# Patient Record
Sex: Female | Born: 1965
Health system: Southern US, Community
[De-identification: ages and names within clinical notes are randomized; demographics above are authoritative.]

## PROBLEM LIST (undated history)

## (undated) DIAGNOSIS — Z5189 Encounter for other specified aftercare: Secondary | ICD-10-CM

## (undated) DIAGNOSIS — K219 Gastro-esophageal reflux disease without esophagitis: Secondary | ICD-10-CM

## (undated) DIAGNOSIS — I10 Essential (primary) hypertension: Secondary | ICD-10-CM

## (undated) DIAGNOSIS — R51 Headache: Secondary | ICD-10-CM

## (undated) DIAGNOSIS — R569 Unspecified convulsions: Secondary | ICD-10-CM

## (undated) HISTORY — PX: HERNIA REPAIR: SHX51

## (undated) HISTORY — DX: Encounter for other specified aftercare: Z51.89

## (undated) HISTORY — DX: Gastro-esophageal reflux disease without esophagitis: K21.9

## (undated) HISTORY — PX: UTERINE FIBROID SURGERY: SHX826

---

## 1999-04-29 ENCOUNTER — Emergency Department (HOSPITAL_COMMUNITY): Admission: EM | Admit: 1999-04-29 | Discharge: 1999-04-29 | Payer: Self-pay | Admitting: Emergency Medicine

## 2003-12-26 ENCOUNTER — Emergency Department (HOSPITAL_COMMUNITY): Admission: AD | Admit: 2003-12-26 | Discharge: 2003-12-26 | Payer: Self-pay | Admitting: Emergency Medicine

## 2005-08-22 ENCOUNTER — Emergency Department (HOSPITAL_COMMUNITY): Admission: EM | Admit: 2005-08-22 | Discharge: 2005-08-22 | Payer: Self-pay | Admitting: Emergency Medicine

## 2007-03-22 ENCOUNTER — Inpatient Hospital Stay (HOSPITAL_COMMUNITY): Admission: AD | Admit: 2007-03-22 | Discharge: 2007-03-22 | Payer: Self-pay | Admitting: Family Medicine

## 2007-05-23 ENCOUNTER — Ambulatory Visit (HOSPITAL_COMMUNITY): Admission: RE | Admit: 2007-05-23 | Discharge: 2007-05-23 | Payer: Self-pay | Admitting: Obstetrics

## 2007-05-23 ENCOUNTER — Ambulatory Visit: Payer: Self-pay | Admitting: Vascular Surgery

## 2007-05-23 ENCOUNTER — Ambulatory Visit: Payer: Self-pay | Admitting: Internal Medicine

## 2007-05-23 ENCOUNTER — Ambulatory Visit: Payer: Self-pay | Admitting: Hospitalist

## 2007-05-23 ENCOUNTER — Encounter (INDEPENDENT_AMBULATORY_CARE_PROVIDER_SITE_OTHER): Payer: Self-pay | Admitting: Obstetrics

## 2007-05-23 ENCOUNTER — Inpatient Hospital Stay (HOSPITAL_COMMUNITY): Admission: EM | Admit: 2007-05-23 | Discharge: 2007-05-30 | Payer: Self-pay | Admitting: Emergency Medicine

## 2007-05-27 ENCOUNTER — Encounter (INDEPENDENT_AMBULATORY_CARE_PROVIDER_SITE_OTHER): Payer: Self-pay | Admitting: Hospitalist

## 2007-06-03 ENCOUNTER — Ambulatory Visit: Payer: Self-pay | Admitting: Internal Medicine

## 2007-06-03 ENCOUNTER — Encounter: Payer: Self-pay | Admitting: Pharmacist

## 2007-06-03 DIAGNOSIS — I82409 Acute embolism and thrombosis of unspecified deep veins of unspecified lower extremity: Secondary | ICD-10-CM | POA: Insufficient documentation

## 2007-06-10 ENCOUNTER — Ambulatory Visit: Payer: Self-pay | Admitting: Hospitalist

## 2007-06-10 LAB — CONVERTED CEMR LAB

## 2007-06-17 ENCOUNTER — Ambulatory Visit: Payer: Self-pay | Admitting: *Deleted

## 2007-06-17 LAB — CONVERTED CEMR LAB: INR: 2

## 2007-06-21 ENCOUNTER — Encounter: Payer: Self-pay | Admitting: Pharmacist

## 2007-06-21 ENCOUNTER — Ambulatory Visit: Payer: Self-pay | Admitting: Internal Medicine

## 2007-06-21 LAB — CONVERTED CEMR LAB

## 2007-06-28 ENCOUNTER — Ambulatory Visit: Payer: Self-pay | Admitting: *Deleted

## 2007-06-28 LAB — CONVERTED CEMR LAB: INR: 5.8

## 2007-07-04 ENCOUNTER — Emergency Department (HOSPITAL_COMMUNITY): Admission: EM | Admit: 2007-07-04 | Discharge: 2007-07-04 | Payer: Self-pay | Admitting: Emergency Medicine

## 2007-07-08 ENCOUNTER — Ambulatory Visit: Payer: Self-pay | Admitting: Infectious Disease

## 2007-07-12 ENCOUNTER — Ambulatory Visit: Payer: Self-pay | Admitting: Internal Medicine

## 2007-07-12 LAB — CONVERTED CEMR LAB: INR: 2.6

## 2007-07-22 ENCOUNTER — Ambulatory Visit: Payer: Self-pay | Admitting: Internal Medicine

## 2007-07-22 LAB — CONVERTED CEMR LAB

## 2007-08-19 ENCOUNTER — Ambulatory Visit: Payer: Self-pay | Admitting: Internal Medicine

## 2007-08-19 LAB — CONVERTED CEMR LAB: INR: 2.1

## 2007-09-09 ENCOUNTER — Emergency Department (HOSPITAL_COMMUNITY): Admission: EM | Admit: 2007-09-09 | Discharge: 2007-09-09 | Payer: Self-pay | Admitting: Emergency Medicine

## 2007-10-07 ENCOUNTER — Ambulatory Visit: Payer: Self-pay | Admitting: Hospitalist

## 2007-10-07 LAB — CONVERTED CEMR LAB: INR: 2

## 2007-11-11 ENCOUNTER — Encounter: Payer: Self-pay | Admitting: Infectious Diseases

## 2007-11-11 ENCOUNTER — Ambulatory Visit: Payer: Self-pay | Admitting: Internal Medicine

## 2007-11-11 LAB — CONVERTED CEMR LAB

## 2007-12-30 ENCOUNTER — Ambulatory Visit: Payer: Self-pay | Admitting: Hospitalist

## 2007-12-30 LAB — CONVERTED CEMR LAB: INR: 2.7

## 2008-01-10 ENCOUNTER — Telehealth: Payer: Self-pay | Admitting: *Deleted

## 2008-01-16 ENCOUNTER — Encounter: Payer: Self-pay | Admitting: Internal Medicine

## 2008-01-16 ENCOUNTER — Ambulatory Visit: Payer: Self-pay | Admitting: Internal Medicine

## 2008-01-16 DIAGNOSIS — D259 Leiomyoma of uterus, unspecified: Secondary | ICD-10-CM | POA: Insufficient documentation

## 2008-01-16 DIAGNOSIS — R059 Cough, unspecified: Secondary | ICD-10-CM | POA: Insufficient documentation

## 2008-01-16 DIAGNOSIS — R05 Cough: Secondary | ICD-10-CM

## 2008-01-17 ENCOUNTER — Telehealth: Payer: Self-pay | Admitting: Internal Medicine

## 2008-01-18 LAB — CONVERTED CEMR LAB
Hemoglobin: 7.1 g/dL — CL (ref 12.0–15.0)
RBC: 4.07 M/uL (ref 3.87–5.11)

## 2008-01-20 ENCOUNTER — Ambulatory Visit: Payer: Self-pay | Admitting: Internal Medicine

## 2008-01-20 DIAGNOSIS — Z86718 Personal history of other venous thrombosis and embolism: Secondary | ICD-10-CM | POA: Insufficient documentation

## 2008-01-30 ENCOUNTER — Encounter (INDEPENDENT_AMBULATORY_CARE_PROVIDER_SITE_OTHER): Payer: Self-pay | Admitting: Pharmacist

## 2008-02-19 ENCOUNTER — Ambulatory Visit (HOSPITAL_COMMUNITY): Admission: RE | Admit: 2008-02-19 | Discharge: 2008-02-19 | Payer: Self-pay | Admitting: Obstetrics

## 2008-08-10 IMAGING — XA IR [PERSON_NAME]/[PERSON_NAME]
3 series · 16 of 24 positions shown · non-contrast
Comparison: none

CLINICAL DATA: 40-year-old with uterine bleeding, presumably from fibroids.  Patient also has a left lower extremity DVT.  We have been asked to place a retrievable IVC filter, which could be removed following hysterectomy. 
IVC VENOGRAM:
ULTRASOUND GUIDANCE FOR VASCULAR ACCESS:
Medication for sedation:  Versed 1.5 mg, fentanyl 75 mcg. 
Total sedation time:  42 minutes. 
Procedure:  Informed consent was obtained from the patient.  The patient's right groin was evaluated with ultrasound, demonstrating a large and patent right common femoral vein.  The right groin was prepped and draped in a sterile fashion.  The skin was anesthetized with 1% lidocaine.   A 21-gauge needle was directed into the right common femoral vein with ultrasound guidance.  A micropuncture dilator set was placed.  A Bentson wire could not be advanced into the IVC, and there appeared to be an obstruction.  A 7obra-Y catheter was used to try to advance the wire into the IVC, but this was found to be difficult.  A venogram performed in the right external iliac vein demonstrated patency of the iliac vein with very large collateral vessels and no significant filling of the infrarenal IVC.  Findings were concerning for an underlying obstruction versus compression from the uterus.  Eventually, a wire was advanced with some difficulty into the suprarenal IVC, and an IVC venogram was performed.  The IVC venogram demonstrated very poor filling of the infrarenal IVC and normal filling of the suprarenal IVC.  The suprarenal IVC appeared to measure at least 25 mm on our examination.  Based on the uncertain etiology of the IVC obstruction, a suprarenal filter was not placed at this time.  The vascular sheath was removed with manual compression, and the patient was transferred to the CT scanner for further imaging.

[Series 1: run · 11 of 52 slices shown (1 of 3)]
[im 1/52]
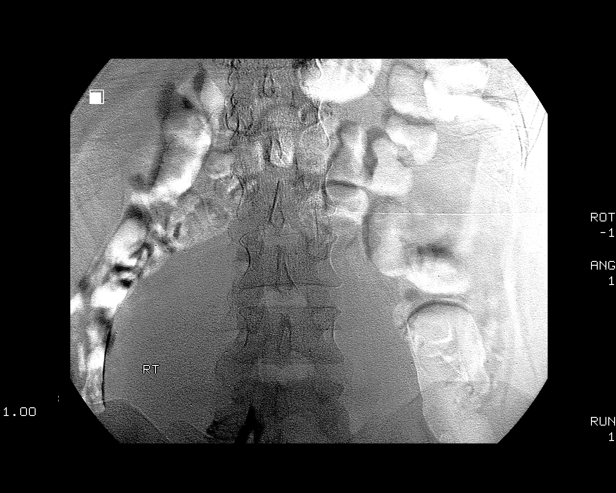
[im 7/52]
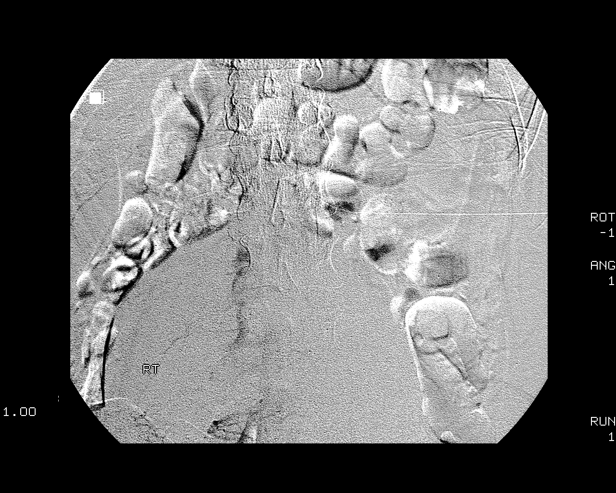
[im 10/52]
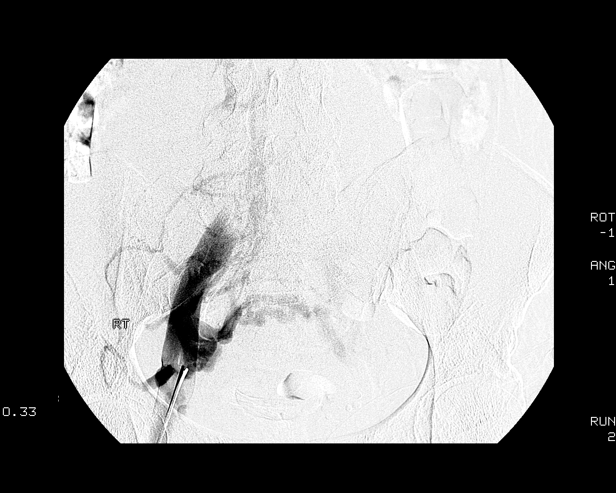
[im 16/52]
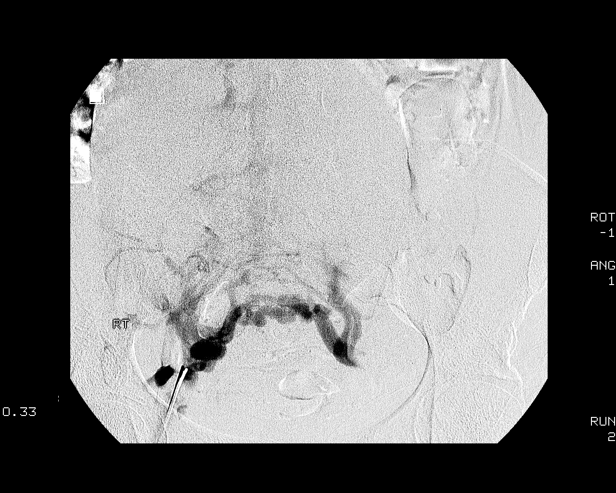
[im 20/52]
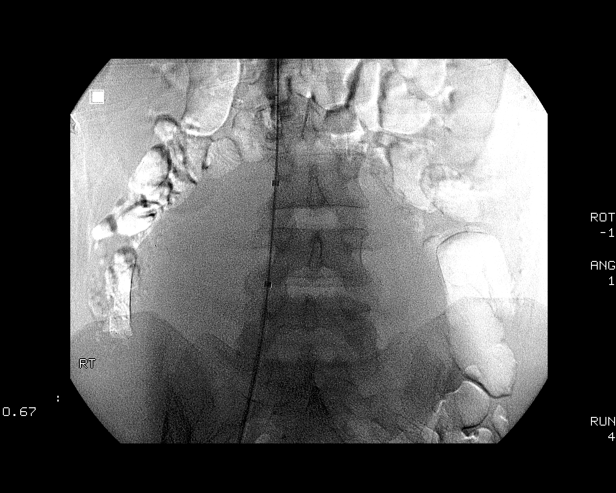
[im 26/52]
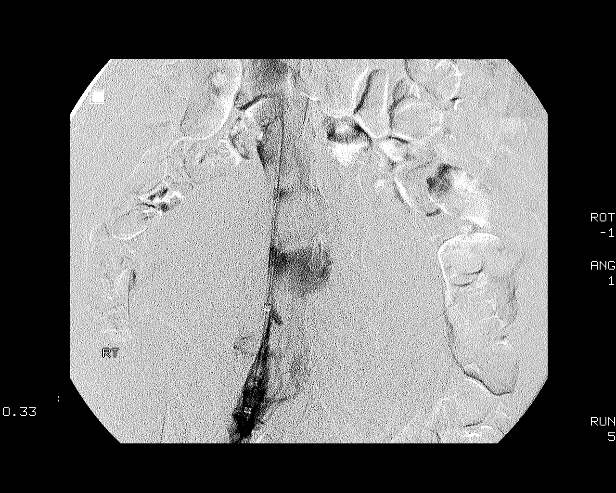
[im 29/52]
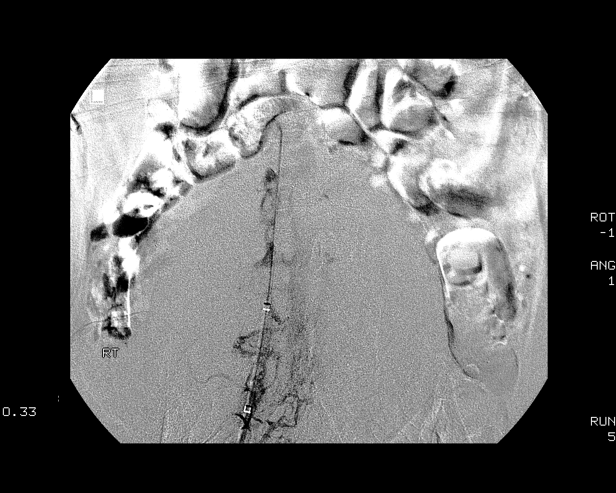
[im 36/52]
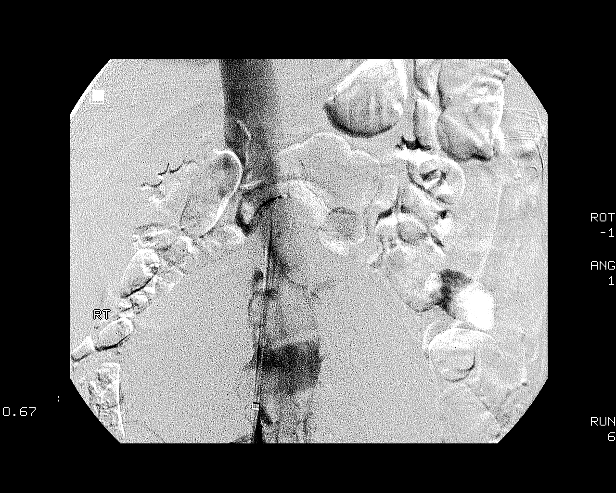
[im 39/52]
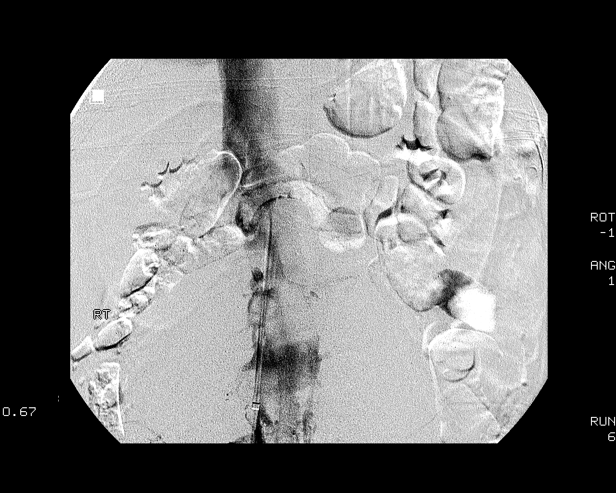
[im 45/52]
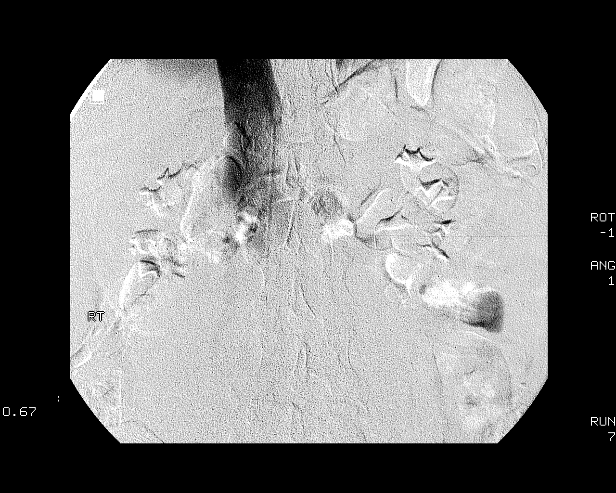
[im 48/52]
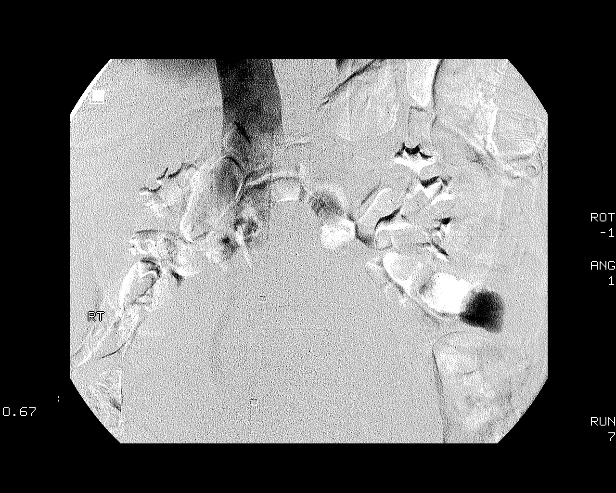

[Series 7: run · 3 of 12 slices shown (2 of 3)]
[im 1/12]
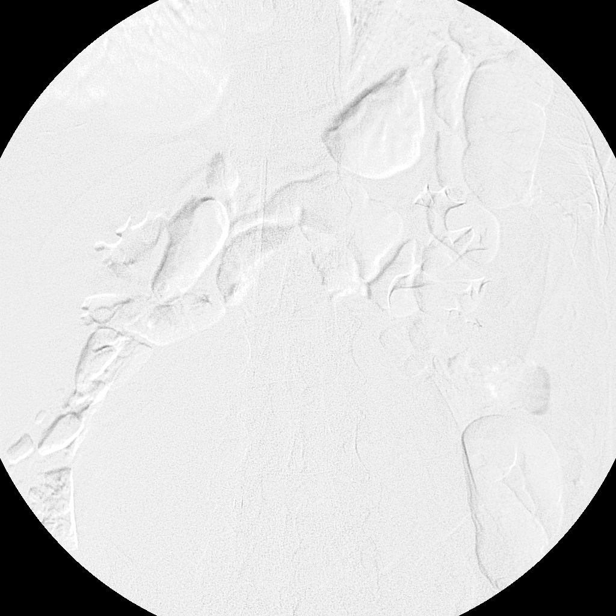
[im 4/12]
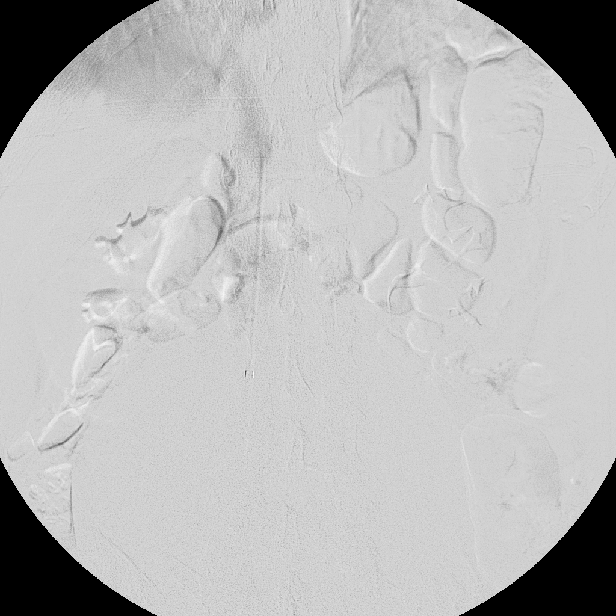
[im 12/12]
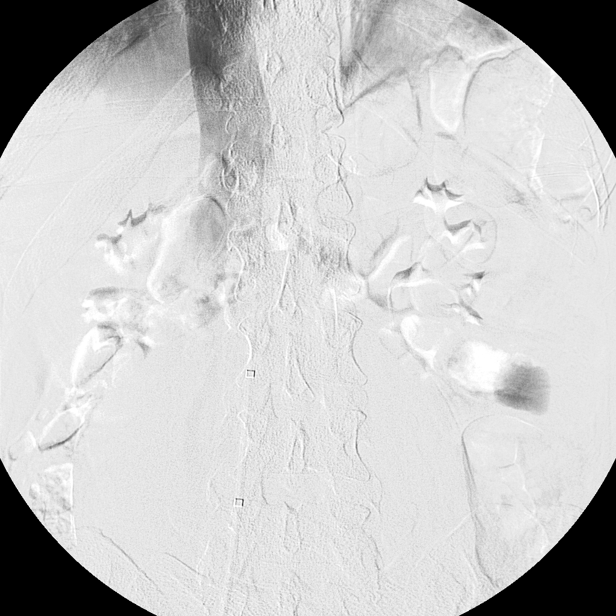

[Series 8: run · 2 of 10 slices shown (3 of 3)]
[im 1/10]
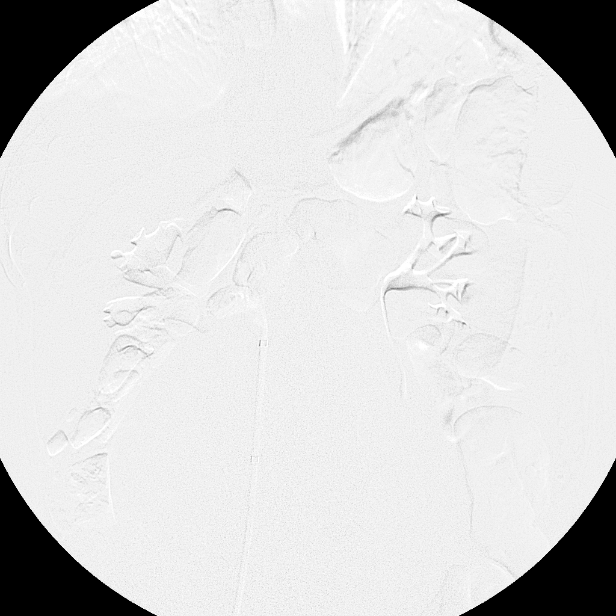
[im 10/10]
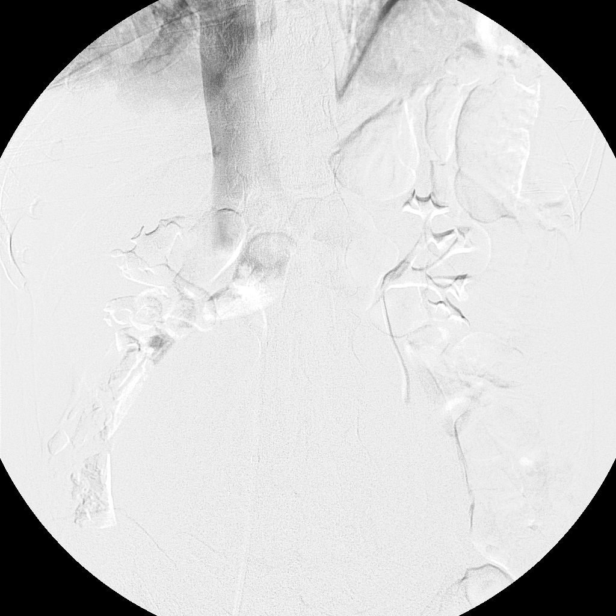

[16 of 24 positions shown; findings below may reference images not displayed]

IMPRESSION: Occlusion of the infrarenal IVC related to either thrombus or compression from the patient's uterus.  The patient's suprarenal IVC was found to be prominent in size and not ideal for filter placement.  As a result, no filter was placed at this time.  IVC filter will be re-evaluated following the patient's CT scan.

## 2009-01-13 ENCOUNTER — Encounter: Payer: Self-pay | Admitting: Internal Medicine

## 2009-01-13 ENCOUNTER — Ambulatory Visit: Payer: Self-pay | Admitting: Internal Medicine

## 2009-01-13 LAB — CONVERTED CEMR LAB
HCT: 36 % (ref 36.0–46.0)
Hemoglobin: 11.3 g/dL — ABNORMAL LOW (ref 12.0–15.0)
MCHC: 31.4 g/dL (ref 30.0–36.0)
MCV: 84.5 fL (ref 78.0–100.0)
RBC: 4.26 M/uL (ref 3.87–5.11)

## 2009-01-15 ENCOUNTER — Ambulatory Visit (HOSPITAL_COMMUNITY): Admission: RE | Admit: 2009-01-15 | Discharge: 2009-01-15 | Payer: Self-pay | Admitting: Internal Medicine

## 2009-01-20 ENCOUNTER — Telehealth: Payer: Self-pay | Admitting: Internal Medicine

## 2009-01-21 ENCOUNTER — Encounter: Admission: RE | Admit: 2009-01-21 | Discharge: 2009-01-21 | Payer: Self-pay | Admitting: Internal Medicine

## 2009-02-12 ENCOUNTER — Ambulatory Visit: Payer: Self-pay | Admitting: Internal Medicine

## 2009-02-12 DIAGNOSIS — K089 Disorder of teeth and supporting structures, unspecified: Secondary | ICD-10-CM | POA: Insufficient documentation

## 2009-05-10 ENCOUNTER — Emergency Department (HOSPITAL_COMMUNITY): Admission: EM | Admit: 2009-05-10 | Discharge: 2009-05-10 | Payer: Self-pay | Admitting: Emergency Medicine

## 2009-06-26 ENCOUNTER — Emergency Department (HOSPITAL_COMMUNITY): Admission: EM | Admit: 2009-06-26 | Discharge: 2009-06-26 | Payer: Self-pay | Admitting: Emergency Medicine

## 2010-09-05 ENCOUNTER — Emergency Department (HOSPITAL_BASED_OUTPATIENT_CLINIC_OR_DEPARTMENT_OTHER): Admission: EM | Admit: 2010-09-05 | Discharge: 2010-09-05 | Payer: Self-pay | Admitting: Emergency Medicine

## 2010-09-19 ENCOUNTER — Emergency Department (HOSPITAL_COMMUNITY): Admission: EM | Admit: 2010-09-19 | Discharge: 2010-09-20 | Payer: Self-pay | Admitting: Emergency Medicine

## 2010-11-01 ENCOUNTER — Emergency Department (HOSPITAL_COMMUNITY)
Admission: EM | Admit: 2010-11-01 | Discharge: 2010-11-01 | Payer: Self-pay | Source: Home / Self Care | Admitting: Emergency Medicine

## 2011-02-08 LAB — BASIC METABOLIC PANEL
CO2: 27 mEq/L (ref 19–32)
Calcium: 8.5 mg/dL (ref 8.4–10.5)
Creatinine, Ser: 0.71 mg/dL (ref 0.4–1.2)
Glucose, Bld: 91 mg/dL (ref 70–99)

## 2011-02-08 LAB — CBC
MCH: 24.8 pg — ABNORMAL LOW (ref 26.0–34.0)
MCV: 79.6 fL (ref 78.0–100.0)
Platelets: 303 10*3/uL (ref 150–400)
RBC: 4.12 MIL/uL (ref 3.87–5.11)
RDW: 17.1 % — ABNORMAL HIGH (ref 11.5–15.5)
WBC: 9.4 10*3/uL (ref 4.0–10.5)

## 2011-02-08 LAB — URINE MICROSCOPIC-ADD ON

## 2011-02-08 LAB — POCT PREGNANCY, URINE: Preg Test, Ur: NEGATIVE

## 2011-02-08 LAB — URINALYSIS, ROUTINE W REFLEX MICROSCOPIC
Bilirubin Urine: NEGATIVE
Ketones, ur: NEGATIVE mg/dL
Nitrite: NEGATIVE
Protein, ur: NEGATIVE mg/dL
Urobilinogen, UA: 0.2 mg/dL (ref 0.0–1.0)

## 2011-02-08 LAB — DIFFERENTIAL
Basophils Absolute: 0 10*3/uL (ref 0.0–0.1)
Eosinophils Absolute: 0.2 10*3/uL (ref 0.0–0.7)
Eosinophils Relative: 2 % (ref 0–5)
Neutrophils Relative %: 69 % (ref 43–77)

## 2011-02-08 LAB — PHENYTOIN LEVEL, TOTAL: Phenytoin Lvl: 6 ug/mL — ABNORMAL LOW (ref 10.0–20.0)

## 2011-02-09 LAB — URINE MICROSCOPIC-ADD ON

## 2011-02-09 LAB — POCT TOXICOLOGY PANEL: Tetrahydrocannabinol: POSITIVE

## 2011-02-09 LAB — URINALYSIS, ROUTINE W REFLEX MICROSCOPIC
Bilirubin Urine: NEGATIVE
Glucose, UA: NEGATIVE mg/dL
Ketones, ur: NEGATIVE mg/dL
Nitrite: NEGATIVE
Protein, ur: NEGATIVE mg/dL
Specific Gravity, Urine: 1.022 (ref 1.005–1.030)
Urobilinogen, UA: 0.2 mg/dL (ref 0.0–1.0)
pH: 6.5 (ref 5.0–8.0)

## 2011-02-09 LAB — BASIC METABOLIC PANEL
BUN: 14 mg/dL (ref 6–23)
CO2: 27 mEq/L (ref 19–32)
Chloride: 105 mEq/L (ref 96–112)
Potassium: 4.4 mEq/L (ref 3.5–5.1)

## 2011-02-09 LAB — ETHANOL: Alcohol, Ethyl (B): <10 mg/dL (ref 0–10)

## 2011-02-09 LAB — PREGNANCY, URINE: Preg Test, Ur: NEGATIVE

## 2011-03-02 ENCOUNTER — Emergency Department (HOSPITAL_BASED_OUTPATIENT_CLINIC_OR_DEPARTMENT_OTHER)
Admission: EM | Admit: 2011-03-02 | Discharge: 2011-03-02 | Disposition: A | Discharge status: Home / Self Care | Payer: Self-pay | Attending: Emergency Medicine | Admitting: Emergency Medicine

## 2011-03-02 DIAGNOSIS — Z86718 Personal history of other venous thrombosis and embolism: Secondary | ICD-10-CM | POA: Insufficient documentation

## 2011-03-02 DIAGNOSIS — R569 Unspecified convulsions: Secondary | ICD-10-CM | POA: Insufficient documentation

## 2011-03-02 DIAGNOSIS — J42 Unspecified chronic bronchitis: Secondary | ICD-10-CM | POA: Insufficient documentation

## 2011-03-02 LAB — CBC
HCT: 40.2 % (ref 36.0–46.0)
MCHC: 33.6 g/dL (ref 30.0–36.0)
MCV: 81 fL (ref 78.0–100.0)
RDW: 13.8 % (ref 11.5–15.5)

## 2011-03-02 LAB — BASIC METABOLIC PANEL
BUN: 11 mg/dL (ref 6–23)
Calcium: 10.1 mg/dL (ref 8.4–10.5)
Creatinine, Ser: 0.8 mg/dL (ref 0.4–1.2)
GFR calc non Af Amer: >60 mL/min (ref >60)
Glucose, Bld: 97 mg/dL (ref 70–99)

## 2011-03-02 LAB — DIFFERENTIAL
Eosinophils Relative: 1 % (ref 0–5)
Lymphocytes Relative: 13 % (ref 12–46)
Lymphs Abs: 1.7 10*3/uL (ref 0.7–4.0)
Monocytes Absolute: 1 10*3/uL (ref 0.1–1.0)

## 2011-03-05 LAB — DIFFERENTIAL
Basophils Absolute: 0.1 10*3/uL (ref 0.0–0.1)
Eosinophils Relative: 5 % (ref 0–5)
Lymphocytes Relative: 27 % (ref 12–46)

## 2011-03-05 LAB — CBC
HCT: 37.2 % (ref 36.0–46.0)
Platelets: 311 10*3/uL (ref 150–400)
RDW: 13.7 % (ref 11.5–15.5)

## 2011-03-05 LAB — BASIC METABOLIC PANEL
BUN: 8 mg/dL (ref 6–23)
Calcium: 9.6 mg/dL (ref 8.4–10.5)
GFR calc non Af Amer: >60 mL/min (ref >60)
Glucose, Bld: 93 mg/dL (ref 70–99)

## 2011-03-05 LAB — POCT CARDIAC MARKERS: Troponin i, poc: <0.05 ng/mL (ref 0.00–0.09)

## 2011-04-11 NOTE — Discharge Summary (Signed)
Tammy [name], Tammy [name]              ACCOUNT NO.:  [id number]   MEDICAL RECORD NO.:  [id number]          PATIENT TYPE:  INP   LOCATION:  6731                         FACILITY:  [facility]   PHYSICIAN:  Alvester Morin, M.D.  DATE OF BIRTH:  05/29/1966   DATE OF ADMISSION:  05/23/2007  DATE OF DISCHARGE:  05/30/2007                               DISCHARGE SUMMARY   DISCHARGE DIAGNOSES:  1. Bilateral pulmonary embolism and left lower extremity deep venous      thrombosis.  2. Menorrhagia secondary to fibroids.  3. Microcytic anemia secondary to menorrhagia.   DISCHARGE MEDICATIONS:  1. Lovenox 90 mg subcutaneously twice daily until seen by Dr Alexandria Lodge.  2. Coumadin 7.5 mg by mouth once daily.  3. Iron 325 mg by mouth three times a day.   DISPOSITION AND FOLLOWUP:  The patient has a followup appointment with  Dr. Gaynell Face, Gynecology, on July 17 at 1:30 p.m.  The patient has been  noted to have huge fibroids.  She is seeing Dr. Gaynell Face for a possible  hysterectomy.  Of note, a CT scan of the abdomen that the fibroid may be  malignant.  The time of hysterectomy will be decided by Dr. Gaynell Face.She  will also need to be evaluated for her menorrhagia  She will also follow  up with Dr. Michaell Cowing in the Coumadin clinic on Monday, June 02, 2097.  She  is currently on Lovenox as well as Coumadin for her PE and DVT.  Her  discharge INR was 1.11.  She will follow up with Dr. Clyda Greener  her  PCP at his office within a week. This will be scheduled by the patient.  At the time of followup, her CBC needs to be checked for followup on her  anemia.  She will need to be clinically evaluated for  improvement inher  PE and DVT and adherence to coumadin.   CONSULTATIONS:  1. Dr. Lowella Dandy, Interventional Radiology.  2. Dr. Gaynell Face of Gynecology.   IMPORTANT DIAGNOSTIC STUDIES:  1. CT scan of the abdomen and pelvis which showed a massively-enlarged      uterus measuring 12 x 15 x 23 cm in size.  There was a  large      central mass about 12 x 12 x 10 cm in size with mixed tissue      density.  There was a concern for malignancy.  The enlarged uterus      had a substantial mass effect of the lower IVC and iliac veins in      the pelvis.  2. A CT angiogram of the chest was done which showed bilateral      pulmonary emboli.  3. Venous Doppler was done which showed a non-occlusive DVT of the mid      common femoral vein proximal to the saphenofemoral junction.   ADMITTING HISTORY AND PHYSICAL:  Tammy [name] is a 45 year old African  American female with no significant medical problems except for  fibroids, who presented with left leg swelling and pain.  She had  recently traveled by car to [place] prior to having the  left leg  swelling.   ALLERGIES:  NO KNOWN DRUG ALLERGIES.   PAST MEDICAL HISTORY:  1. Fibroids recently diagnosed by pelvic and vaginal ultrasound.  2. History of menorrhagia for the last 3-4 months.  3. History of anemia secondary to menorrhagia.   SURGICAL HISTORY:  Left inguinal hernia repair and tubal ligation in  1991 and 1992, respectively.   MEDICATIONS:  Iron 100 mg p.o. daily.   SUBSTANCE HISTORY:  Smokes one to two packs per day of the last 10  years, quit 3 years back.   SOCIAL HISTORY:  She is single.   PERTINENT PHYSICAL EXAMINATION:  VITAL SIGNS: Temperature 97.7, blood  pressure 132/87, pulse 69, respiratory rate 18, O2 SATs 99%.  ABDOMEN:  She had of 15 x 10-cm mass in the lower pelvis.  EXTREMITIES:  Examination showed a slightly enlarged left leg.   ADMISSION LABORATORY DATA:  Hemoglobin 7.4, WBC 6.3, platelet 347,000,  MCV of 65.3.  Sodium 139, potassium 3.7, chloride 108, bicarb 24, BUN 6,  creatinine 0.67 and a glucose of 84.  INR was 1.1.  Bilirubin was 0.6,  alkaline phosphatase 47, SGOT 18, SGPT 9, protein 6.3, albumin 3.3 and  calcium 9.1.   HOSPITAL COURSE:  PROBLEM #1 - BILATERAL PULMONARY EMBOLISM AND LEFT  LOWER EXTREMITY DEEP VENOUS  THROMBOSIS:  The patient presented with pain  and swelling in the left lower extremity.  A Doppler revealed a DVT.  She was having active bleeding from her vagina, so she was started on  heparin without the loading dose..  Over the next subsequent days, she  had persistent shortness of breath and elevated cardiac enzymes along  with T inversions in the anterior leads of her EKG.  CT angiogram of the  chest was done which showed bilateral pulmonary embolism.  She was  continued on the heparin until a decision could be reached about what to  do with the enlarged uterus and the menorrhagia.  She was then  transitioned to Lovenox and Coumadin as surgery was not planned.  Currently, she is getting Lovenox 90 mg subcu b.i.d. along with Coumadin  7.5 mg daily.  She will go home with the Lovenox bridge.  She will  follow up with Dr. Michaell Cowing on July 7; at that time, if her INR is  therapeutic, her Lovenox can be stopped.  She will need to take these  for at least 6-12 months.  The exact etiology of the DVT is not clear,  but is probably because of the enlarged uterus compressing the vessels  going down to the lower extremities or the history of travel.  During  the course of hospitalization, an attempt was made to put in an IVC  filter because the patient was having menorrhagia from her fibroid.  The  IVC filter could not be placed because of the large uterus obstructing  the anatomy of that region.  Uterine artery embolization was also  considered but was  finally deemed to be not possible because it would  not stop the bleeding, as the uterus was too large.  Consultations were  obtained from Dr. Gaynell Face, Gynecology, and Dr. Lowella Dandy, Interventional  Radiology, to reach this decision.  The patient was then transitioned to  Lovenox and Coumadin.  The bleeding stopped by the time of discharge.  The patient has been counseled to carefully watch for bleeding, as she  is on 2 different blood thinners.    PROBLEM #2 - MENORRHAGIA SECONDARY TO UTERINE FIBROIDS:  The patient had  persistent menorrhagia during hospitalization.  Her hemoglobin dropped  to 7.4.  She needed blood transfusions.  Dr. Gaynell Face was consulted on  the telephone for her management.  He suggested that the patient get  Lupron.  She was given 3.75 mg of Lupron injection IM.  Following the  Lupron, her menorrhagia decreased.  At the time of discharge, she did  not have any bleeding per vagina.  There was a concern that the fibroids  were malignant because of their very large size on the CT scan; Dr.  Gaynell Face was consulted on that.  He has deferred surgery for now because  of the patient's DVT and pulmonary embolism.  As her bleeding had  stopped on the Lupron, we will need to closely follow this.  The patient  has a followup appointment with Dr. Gaynell Face in 2 weeks' time to decide  on surgery   PROBLEM #3 - ANEMIA DUE TO MENORRHAGIA AND IRON DEFICIENCY:  The patient  had persistent bleeding at the time of admission; in addition, she was  also started on blood thinning medications for her PE.  She had to be  transfused during this hospitalization.  At the time of discharge, the  vaginal bleeding stopped as she was put on Lupron.  Her hemoglobin at  the time of discharge was 10.4.  She has been asked to closely monitor  her vaginal bleeding and return to the hospital if needed.  She will  follow up with Dr. Gaynell Face.  She will continue iron supplementation for  the iron deficiency anemia.   DISCHARGE VITAL SIGNS:  At the time of discharge, the patient's vital  signs were as follows:  Blood pressure 136/90, respirations 20, pulse  91, temperature 98.5, O2 SATs 92% on room air.   DISCHARGE LABORATORY DATA:  Hemoglobin 10.6, WBC 6.9, platelets 289,000.  INR 1.1.  Sodium 138, potassium 3.8, creatinine 0.83.      Ronda Fairly, M.D.  Electronically Signed      Alvester Morin, M.D.  Electronically Signed     YB/MEDQ  D:  05/30/2007  T:  05/31/2007  Job:  [id number]   cc:   Kathreen Cosier, M.D.

## 2011-08-03 ENCOUNTER — Inpatient Hospital Stay (HOSPITAL_COMMUNITY)
Admission: AD | Admit: 2011-08-03 | Discharge: 2011-08-03 | Disposition: A | Discharge status: Other Acute Inpatient Hospital | Payer: Self-pay | Source: Ambulatory Visit | Attending: Obstetrics & Gynecology | Admitting: Obstetrics & Gynecology

## 2011-08-03 ENCOUNTER — Encounter (HOSPITAL_COMMUNITY): Payer: Self-pay | Admitting: *Deleted

## 2011-08-03 ENCOUNTER — Emergency Department (HOSPITAL_COMMUNITY)
Admission: EM | Admit: 2011-08-03 | Discharge: 2011-08-03 | Disposition: A | Discharge status: Home / Self Care | Payer: Self-pay | Attending: Emergency Medicine | Admitting: Emergency Medicine

## 2011-08-03 DIAGNOSIS — R569 Unspecified convulsions: Secondary | ICD-10-CM | POA: Insufficient documentation

## 2011-08-03 DIAGNOSIS — Z86718 Personal history of other venous thrombosis and embolism: Secondary | ICD-10-CM | POA: Insufficient documentation

## 2011-08-03 DIAGNOSIS — G4489 Other headache syndrome: Secondary | ICD-10-CM

## 2011-08-03 DIAGNOSIS — R410 Disorientation, unspecified: Secondary | ICD-10-CM

## 2011-08-03 DIAGNOSIS — R51 Headache: Secondary | ICD-10-CM | POA: Insufficient documentation

## 2011-08-03 DIAGNOSIS — G40909 Epilepsy, unspecified, not intractable, without status epilepticus: Secondary | ICD-10-CM | POA: Insufficient documentation

## 2011-08-03 DIAGNOSIS — J42 Unspecified chronic bronchitis: Secondary | ICD-10-CM | POA: Insufficient documentation

## 2011-08-03 HISTORY — DX: Headache: R51

## 2011-08-03 HISTORY — DX: Unspecified convulsions: R56.9

## 2011-08-03 LAB — URINE MICROSCOPIC-ADD ON

## 2011-08-03 LAB — BASIC METABOLIC PANEL
Calcium: 8.9 mg/dL (ref 8.4–10.5)
Creatinine, Ser: 0.66 mg/dL (ref 0.50–1.10)
GFR calc non Af Amer: >60 mL/min (ref >60)
Glucose, Bld: 100 mg/dL — ABNORMAL HIGH (ref 70–99)
Sodium: 136 mEq/L (ref 135–145)

## 2011-08-03 LAB — URINALYSIS, ROUTINE W REFLEX MICROSCOPIC
Bilirubin Urine: NEGATIVE
Glucose, UA: NEGATIVE mg/dL
Hgb urine dipstick: NEGATIVE
Ketones, ur: NEGATIVE mg/dL
Nitrite: NEGATIVE
pH: 6 (ref 5.0–8.0)

## 2011-08-03 LAB — CBC
Hemoglobin: 12.4 g/dL (ref 12.0–15.0)
MCH: 27.4 pg (ref 26.0–34.0)
MCHC: 32.7 g/dL (ref 30.0–36.0)
MCV: 83.7 fL (ref 78.0–100.0)

## 2011-08-03 LAB — POCT PREGNANCY, URINE: Preg Test, Ur: NEGATIVE

## 2011-08-03 LAB — DIFFERENTIAL
Basophils Relative: 0 % (ref 0–1)
Eosinophils Absolute: 0 10*3/uL (ref 0.0–0.7)
Monocytes Absolute: 0.5 10*3/uL (ref 0.1–1.0)
Monocytes Relative: 3 % (ref 3–12)

## 2011-08-03 MED ORDER — SODIUM CHLORIDE 0.9 % IV SOLN
INTRAVENOUS | Status: DC
  Administered 2011-08-03: 10:00:00 via INTRAVENOUS

## 2011-08-03 NOTE — ED Notes (Signed)
Now states took the bus here.  [facility] present. [place] CNM at bedside

## 2011-08-03 NOTE — ED Notes (Signed)
Security checked perimeter, no bicycle found.

## 2011-08-03 NOTE — ED Notes (Signed)
Discussed with mid level, pt unable to answer questions regarding medical hx, inappropriate answers. Pt is here alone, "rode bike" to get here.  [place], CNM explained. Transfer to [facility]

## 2011-08-03 NOTE — Progress Notes (Signed)
Pt states she bit the R side of her tongue 9-5 and it is hurting and causing her head to hurt. Pt is unable to articulate sentences and unable to tell what she is saying at times. Also says she is having upper abdominal pain, has had a hysterectomy in the past.

## 2011-08-03 NOTE — ED Provider Notes (Signed)
Chief Complaint:  Headache, Bit tongue  Tammy [name] Tammy [name] is  45 y.o.   No LMP recorded. Patient has had a hysterectomy..    She presents complaining waking up this morning on the floor, she states that she urinated self, and bit her tongue. States now with headache and sore tongue. States she rode her bike to [facility]. Reports hx of seizures, states last seizure 1 month ago, previously evaluated by [facility]. Unable to give med names.  Obstetrical/Gynecological History: OB History    Grav Para Term Preterm Abortions TAB SAB Ect Mult Living   3    1 0 1   2      Past Medical History: Unable to assess from pt.  Per chart hx of Fibroids, DVT, PE,  Per pt hx of seizures  Past Surgical History: Per pt: Hysterectomy   Family History: No family history on file.  Social History: History  Substance Use Topics  . Smoking status: Not on file  . Smokeless tobacco: Not on file  . Alcohol Use: Not on file    Allergies: Allergies not on file  No prescriptions prior to admission    Review of Systems - Negative except what has been reviewed in HPI but unclear  History obtained from unobtainable from patient due to mental status  Physical Exam   Blood pressure 132/88, pulse 69, temperature 98.5 F (36.9 C), temperature source Oral, resp. rate 16, height 5\' 3"  (1.6 m), weight 103.148 kg (227 lb 6.4 oz), SpO2 98.00%.  General: General appearance - overweight and drowsy Mental status - alert, oriented to person, place,, disoriented to time, year, month, date. Able to do simple math but unable to recall President. Speech garble at times Eyes - pupils constricted, reactive to light Ears - bilateral TM's and external ear canals normal Nose - normal and patent, no erythema, discharge or polyps Mouth - mucous membranes moist, pharynx normal without lesions and excoration on side side of tongue c/w recent bite Neck - supple, no significant adenopathy, full ROM Lymphatics - no palpable  lymphadenopathy, no hepatosplenomegaly Chest - clear to auscultation, no wheezes, rales or rhonchi, symmetric air entry Heart - normal rate, regular rhythm, normal S1, S2, no murmurs, rubs, clicks or gallops Abdomen - soft, nontender, nondistended, no masses or organomegaly Back exam - full range of motion, no tenderness, palpable spasm or pain on motion Neurological - neck supple without rigidity,  DTR's normal and symmetric, Babinski sign negative, motor and sensory grossly normal bilaterally, face symetrical, NL strength BL Musculoskeletal - no joint tenderness, deformity or swelling, not examined Extremities - peripheral pulses normal, no pedal edema, no clubbing or cyanosis Focused Gynecological Exam:  examination not indicated   Assessment:  1. Probable s/p seizure 2. Mental status change   Patient Active Problem List  Diagnoses  . FIBROIDS, UTERUS  . EMBOLISM/THROMBOSIS, DEEP VSL LWR EXTRM NOS  . DENTAL PAIN  . COUGH  . DVT, HX OF    Plan:  Transfer to [facility] via [facility] for further evaluation: Dr. Clarene Duke accepting, Charge RN, [name], aware  Tammy [name] E. 08/03/2011,9:38 AM

## 2011-08-03 NOTE — Progress Notes (Signed)
Head hurting real bad, increase in headaches, reports possible seizure in last month.

## 2011-09-07 LAB — URINALYSIS, ROUTINE W REFLEX MICROSCOPIC
Bilirubin Urine: NEGATIVE
Glucose, UA: NEGATIVE
Ketones, ur: NEGATIVE
Nitrite: NEGATIVE
Specific Gravity, Urine: 1.015
pH: 6

## 2011-09-07 LAB — URINE MICROSCOPIC-ADD ON

## 2011-09-07 LAB — I-STAT 8, (EC8 V) (CONVERTED LAB)
Acid-Base Excess: 1
Bicarbonate: 28.2 — ABNORMAL HIGH
HCT: 37
Operator id: 146091
TCO2: 30
pCO2, Ven: 56.9 — ABNORMAL HIGH
pH, Ven: 7.303 — ABNORMAL HIGH

## 2011-09-07 LAB — PROTIME-INR
INR: 2.1 — ABNORMAL HIGH
Prothrombin Time: 24 — ABNORMAL HIGH

## 2011-09-11 LAB — URINALYSIS, ROUTINE W REFLEX MICROSCOPIC
Nitrite: NEGATIVE
Protein, ur: 30 — AB
Specific Gravity, Urine: 1.013
Urobilinogen, UA: 1

## 2011-09-11 LAB — CBC
MCHC: 32.4
MCV: 79.9
Platelets: 296
RBC: 4.96
WBC: 12.4 — ABNORMAL HIGH

## 2011-09-11 LAB — COMPREHENSIVE METABOLIC PANEL
ALT: 13
AST: 18
Albumin: 3.9
CO2: 25
Calcium: 9.5
Chloride: 95 — ABNORMAL LOW
Creatinine, Ser: 0.83
GFR calc Af Amer: >60
Sodium: 130 — ABNORMAL LOW

## 2011-09-11 LAB — URINE MICROSCOPIC-ADD ON

## 2011-09-11 LAB — DIFFERENTIAL
Basophils Relative: 0
Eosinophils Relative: 0
Lymphs Abs: 1.9
Monocytes Absolute: 1 — ABNORMAL HIGH
Monocytes Relative: 8
Neutro Abs: 9.5 — ABNORMAL HIGH

## 2011-09-11 LAB — PROTIME-INR: INR: 4.8 — ABNORMAL HIGH

## 2011-09-11 LAB — PREGNANCY, URINE: Preg Test, Ur: NEGATIVE

## 2011-09-11 LAB — APTT: aPTT: 97 — ABNORMAL HIGH

## 2011-09-12 LAB — CBC
HCT: 28.9 — ABNORMAL LOW
HCT: 33.3 — ABNORMAL LOW
HCT: 35.8 — ABNORMAL LOW
HCT: 36.3
Hemoglobin: 10.6 — ABNORMAL LOW
Hemoglobin: 11.4 — ABNORMAL LOW
Hemoglobin: 9.2 — ABNORMAL LOW
MCV: 70.8 — ABNORMAL LOW
MCV: 71.7 — ABNORMAL LOW
Platelets: 301
Platelets: 303
Platelets: 304
RBC: 4.54
RDW: 31.4 — ABNORMAL HIGH
RDW: 31.8 — ABNORMAL HIGH
RDW: 31.8 — ABNORMAL HIGH
RDW: 32 — ABNORMAL HIGH
RDW: 32.2 — ABNORMAL HIGH
WBC: 6.6
WBC: 6.9
WBC: 6.9
WBC: 7.2

## 2011-09-12 LAB — BASIC METABOLIC PANEL
BUN: 3 — ABNORMAL LOW
BUN: 4 — ABNORMAL LOW
Calcium: 9.1
Calcium: 9.3
Creatinine, Ser: 0.78
GFR calc Af Amer: >60
GFR calc non Af Amer: >60
GFR calc non Af Amer: >60
GFR calc non Af Amer: >60
Glucose, Bld: 84
Glucose, Bld: 88
Glucose, Bld: 90
Potassium: 3.3 — ABNORMAL LOW
Potassium: 3.8
Potassium: 3.8
Sodium: 138

## 2011-09-12 LAB — MAGNESIUM: Magnesium: 1.8

## 2011-09-12 LAB — PROTIME-INR
INR: 1.1
Prothrombin Time: 13.8
Prothrombin Time: 13.9

## 2011-09-12 LAB — APTT: aPTT: 64 — ABNORMAL HIGH

## 2011-09-12 LAB — HEPARIN LEVEL (UNFRACTIONATED)
Heparin Unfractionated: 0.28 — ABNORMAL LOW
Heparin Unfractionated: 0.35

## 2011-09-12 LAB — CARDIAC PANEL(CRET KIN+CKTOT+MB+TROPI)
CK, MB: 0.7
Relative Index: INVALID
Total CK: 71

## 2011-09-13 LAB — PROTEIN C, TOTAL: Protein C, Total: 55 % — ABNORMAL LOW (ref 70–140)

## 2011-09-13 LAB — RETICULOCYTES
Retic Count, Absolute: 23.3
Retic Ct Pct: 0.6

## 2011-09-13 LAB — CBC
HCT: 24.1 — ABNORMAL LOW
HCT: 24.5 — ABNORMAL LOW
HCT: 29.6 — ABNORMAL LOW
HCT: 29.7 — ABNORMAL LOW
HCT: 30.8 — ABNORMAL LOW
HCT: 31.3 — ABNORMAL LOW
Hemoglobin: 10.5 — ABNORMAL LOW
Hemoglobin: 7.4 — CL
Hemoglobin: 7.4 — CL
Hemoglobin: 9.1 — ABNORMAL LOW
Hemoglobin: 9.2 — ABNORMAL LOW
Hemoglobin: 9.4 — ABNORMAL LOW
Hemoglobin: 9.6 — ABNORMAL LOW
Hemoglobin: 9.7 — ABNORMAL LOW
MCHC: 30.5
MCHC: 31
MCHC: 31
MCHC: 31.1
MCV: 69 — ABNORMAL LOW
MCV: 69.2 — ABNORMAL LOW
MCV: 69.5 — ABNORMAL LOW
Platelets: 284
Platelets: 332
Platelets: 347
Platelets: 356
Platelets: 373
RBC: 3.76 — ABNORMAL LOW
RBC: 4.31
RBC: 4.48
RBC: 4.53
RBC: 4.89
RDW: 30.6 — ABNORMAL HIGH
RDW: 31 — ABNORMAL HIGH
RDW: 31 — ABNORMAL HIGH
RDW: 31.3 — ABNORMAL HIGH
RDW: 31.3 — ABNORMAL HIGH
RDW: 31.8 — ABNORMAL HIGH
RDW: 32.4 — ABNORMAL HIGH
WBC: 6.3
WBC: 8

## 2011-09-13 LAB — BASIC METABOLIC PANEL
BUN: 4 — ABNORMAL LOW
BUN: 5 — ABNORMAL LOW
BUN: 6
CO2: 23
Calcium: 8.9
Chloride: 105
Chloride: 108
Creatinine, Ser: 0.68
GFR calc Af Amer: >60
GFR calc non Af Amer: >60
GFR calc non Af Amer: >60
GFR calc non Af Amer: >60
GFR calc non Af Amer: >60
Glucose, Bld: 78
Glucose, Bld: 84
Glucose, Bld: 91
Potassium: 3.4 — ABNORMAL LOW
Potassium: 3.7
Potassium: 3.7
Sodium: 136
Sodium: 137
Sodium: 139

## 2011-09-13 LAB — CARDIAC PANEL(CRET KIN+CKTOT+MB+TROPI)
CK, MB: 2.9
Relative Index: INVALID
Total CK: 75
Total CK: 76
Total CK: 76
Troponin I: 0.37 — ABNORMAL HIGH

## 2011-09-13 LAB — CROSSMATCH
ABO/RH(D): A POS
ABO/RH(D): A POS

## 2011-09-13 LAB — HEMOGLOBINOPATHY EVALUATION
Hemoglobin Other: 0 (ref 0.0–0.0)
Hgb A2 Quant: 2.2
Hgb A: 97.8 %
Hgb F Quant: 0 (ref 0.0–2.0)
Hgb S Quant: 0 % (ref 0.0–0.0)

## 2011-09-13 LAB — APTT
aPTT: 30
aPTT: 31
aPTT: 63 — ABNORMAL HIGH

## 2011-09-13 LAB — OCCULT BLOOD X 1 CARD TO LAB, STOOL: Fecal Occult Bld: NEGATIVE

## 2011-09-13 LAB — DIFFERENTIAL
Basophils Absolute: 0.1
Eosinophils Relative: 4
Lymphocytes Relative: 27
Monocytes Relative: 10
Neutro Abs: 3.6

## 2011-09-13 LAB — CARDIOLIPIN ANTIBODIES, IGG, IGM, IGA
Anticardiolipin IgA: 21 (ref <13)
Anticardiolipin IgG: 7 — ABNORMAL LOW (ref <11)
Anticardiolipin IgM: <7 — ABNORMAL LOW (ref <10)

## 2011-09-13 LAB — COMPREHENSIVE METABOLIC PANEL
AST: 18
CO2: 26
Chloride: 107
Creatinine, Ser: 0.73
GFR calc Af Amer: >60
GFR calc non Af Amer: >60
Glucose, Bld: 110 — ABNORMAL HIGH
Total Bilirubin: 0.6

## 2011-09-13 LAB — PROTIME-INR
INR: 1.1
Prothrombin Time: 14

## 2011-09-13 LAB — IRON AND TIBC
Iron: 14 — ABNORMAL LOW
TIBC: 352

## 2011-09-13 LAB — VITAMIN B12: Vitamin B-12: 303 (ref 211–911)

## 2011-09-13 LAB — LUPUS ANTICOAGULANT PANEL: Lupus Anticoagulant: NOT DETECTED

## 2011-09-13 LAB — HOMOCYSTEINE: Homocysteine: 13.3

## 2011-09-13 LAB — PROTEIN S, TOTAL: Protein S Ag, Total: 93 % (ref 70–140)

## 2011-09-13 LAB — ANTITHROMBIN III: AntiThromb III Func: 83

## 2011-09-13 LAB — PROTEIN S ACTIVITY: Protein S Activity: 60 % — ABNORMAL LOW (ref 81–180)

## 2011-09-13 LAB — ABO/RH: ABO/RH(D): A POS

## 2013-07-02 ENCOUNTER — Emergency Department (HOSPITAL_BASED_OUTPATIENT_CLINIC_OR_DEPARTMENT_OTHER)
Admission: EM | Admit: 2013-07-02 | Discharge: 2013-07-02 | Disposition: A | Discharge status: Home / Self Care | Payer: Self-pay | Attending: Emergency Medicine | Admitting: Emergency Medicine

## 2013-07-02 ENCOUNTER — Encounter (HOSPITAL_BASED_OUTPATIENT_CLINIC_OR_DEPARTMENT_OTHER): Payer: Self-pay | Admitting: Family Medicine

## 2013-07-02 DIAGNOSIS — Z87442 Personal history of urinary calculi: Secondary | ICD-10-CM | POA: Insufficient documentation

## 2013-07-02 DIAGNOSIS — Z79899 Other long term (current) drug therapy: Secondary | ICD-10-CM | POA: Insufficient documentation

## 2013-07-02 DIAGNOSIS — J45909 Unspecified asthma, uncomplicated: Secondary | ICD-10-CM | POA: Insufficient documentation

## 2013-07-02 DIAGNOSIS — R569 Unspecified convulsions: Secondary | ICD-10-CM | POA: Insufficient documentation

## 2013-07-02 LAB — PHENYTOIN LEVEL, TOTAL: Phenytoin Lvl: 1.5 ug/mL — ABNORMAL LOW (ref 10.0–20.0)

## 2013-07-02 LAB — CBC WITH DIFFERENTIAL/PLATELET
Basophils Absolute: 0 10*3/uL (ref 0.0–0.1)
Basophils Relative: 0 % (ref 0–1)
Eosinophils Absolute: 0 10*3/uL (ref 0.0–0.7)
Eosinophils Relative: 0 % (ref 0–5)
HCT: 40.8 % (ref 36.0–46.0)
Lymphocytes Relative: 7 % — ABNORMAL LOW (ref 12–46)
MCH: 28.1 pg (ref 26.0–34.0)
MCHC: 32.8 g/dL (ref 30.0–36.0)
MCV: 85.5 fL (ref 78.0–100.0)
Monocytes Absolute: 0.5 10*3/uL (ref 0.1–1.0)
Platelets: 240 10*3/uL (ref 150–400)
RDW: 13.2 % (ref 11.5–15.5)
WBC: 12.6 10*3/uL — ABNORMAL HIGH (ref 4.0–10.5)

## 2013-07-02 LAB — COMPREHENSIVE METABOLIC PANEL
AST: 15 U/L (ref 0–37)
Albumin: 3.6 g/dL (ref 3.5–5.2)
BUN: 13 mg/dL (ref 6–23)
Calcium: 9.8 mg/dL (ref 8.4–10.5)
Chloride: 104 mEq/L (ref 96–112)
Creatinine, Ser: 0.8 mg/dL (ref 0.50–1.10)
Total Bilirubin: 0.2 mg/dL — ABNORMAL LOW (ref 0.3–1.2)
Total Protein: 7.3 g/dL (ref 6.0–8.3)

## 2013-07-02 MED ORDER — SODIUM CHLORIDE 0.9 % IV SOLN
1000.0000 mg | Freq: Once | INTRAVENOUS | Status: AC
  Administered 2013-07-02: 1000 mg via INTRAVENOUS
  Filled 2013-07-02: qty 30
  Filled 2013-07-02: qty 5
  Filled 2013-07-02: qty 750

## 2013-07-02 MED ORDER — OXYCODONE-ACETAMINOPHEN 5-325 MG PO TABS
2.0000 | ORAL_TABLET | Freq: Once | ORAL | Status: AC
  Administered 2013-07-02: 2 via ORAL
  Filled 2013-07-02 (×2): qty 2

## 2013-07-02 MED ORDER — PHENYTOIN SODIUM EXTENDED 100 MG PO CAPS: ORAL_CAPSULE | ORAL | Status: DC

## 2013-07-02 NOTE — ED Provider Notes (Signed)
  CSN: [id number]     Arrival date & time 07/02/13  1148 History     First MD Initiated Contact with Patient 07/02/13 1218     Chief Complaint  Patient presents with  . Seizures   (Consider location/radiation/quality/duration/timing/severity/associated sxs/prior Treatment) Patient is a 47 y.o. female presenting with seizures. The history is provided by the patient. No language interpreter was used.  Seizures Seizure activity on arrival: no   Seizure type:  Unable to specify Preceding symptoms: aura   Initial focality:  None Return to baseline: yes   Severity:  Severe Timing:  Once Number of seizures this episode:  1 Progression:  Resolved Recent head injury:  No recent head injuries PTA treatment:  None   Past Medical History  Diagnosis Date  . Seizures   . Headache(784.0)   . Asthma   . Kidney stones    Past Surgical History  Procedure Laterality Date  . Hernia repair    . Uterine fibroid surgery     No family history on file. History  Substance Use Topics  . Smoking status: Never Smoker   . Smokeless tobacco: Not on file  . Alcohol Use: No   OB History   Grav Para Term Preterm Abortions TAB SAB Ect Mult Living   3    1 0 1   2     Review of Systems  Neurological: Positive for seizures.  All other systems reviewed and are negative.    Allergies  Review of patient's allergies indicates no known allergies.  Home Medications   Current Outpatient Rx  Name  Route  Sig  Dispense  Refill  . phenytoin (DILANTIN) 100 MG ER capsule   Oral   Take 300 mg by mouth daily.          BP 148/97  Pulse 82  Resp 18  Ht 5' 2.5" (1.588 m)  Wt 220 lb (99.791 kg)  BMI 39.57 kg/m2  SpO2 98% Physical Exam  Nursing note and vitals reviewed. Constitutional: She appears well-developed and well-nourished.  HENT:  Head: Normocephalic and atraumatic.  Mouth/Throat: Oropharynx is clear and moist.  Eyes: Conjunctivae and EOM are normal. Pupils are equal, round, and  reactive to light.  Neck: Normal range of motion. Neck supple.  Cardiovascular: Normal rate and normal heart sounds.   Pulmonary/Chest: Effort normal and breath sounds normal.  Abdominal: Soft. Bowel sounds are normal.  Musculoskeletal: Normal range of motion.  Neurological: She is alert.  Skin: Skin is warm.  Psychiatric: She has a normal mood and affect.    ED Course   Procedures (including critical care time)  Labs Reviewed  CBC WITH DIFFERENTIAL - Abnormal; Notable for the following:    WBC 12.6 (*)    Neutrophils Relative % 89 (*)    Neutro Abs 11.2 (*)    Lymphocytes Relative 7 (*)    All other components within normal limits  COMPREHENSIVE METABOLIC PANEL - Abnormal; Notable for the following:    Glucose, Bld 117 (*)    Total Bilirubin 0.2 (*)    GFR calc non Af Amer 88 (*)    All other components within normal limits  PHENYTOIN LEVEL, TOTAL   No results found. 1. Seizure     MDM  Pt given dilantin Iv x 1 gram.   Pt feels better.  Pt given rx for dilantin   Elson Areas, PA-C 07/02/13 2333

## 2013-07-02 NOTE — ED Notes (Signed)
Patient ambulates around RN station without difficulty. 

## 2013-07-02 NOTE — ED Notes (Signed)
Pt c/o having a seizure last night and again this morning. Pt sts she is taking dilantin for same. Pt also c/o migraine.

## 2013-07-05 NOTE — ED Provider Notes (Signed)
Medical screening examination/treatment/procedure(s) were performed by non-physician practitioner and as supervising physician I was immediately available for consultation/collaboration.  Derwood Kaplan, MD 07/05/13 1714

## 2013-09-07 ENCOUNTER — Emergency Department (HOSPITAL_BASED_OUTPATIENT_CLINIC_OR_DEPARTMENT_OTHER)
Admission: EM | Admit: 2013-09-07 | Discharge: 2013-09-07 | Disposition: A | Discharge status: Home / Self Care | Payer: Self-pay | Attending: Emergency Medicine | Admitting: Emergency Medicine

## 2013-09-07 ENCOUNTER — Emergency Department (HOSPITAL_BASED_OUTPATIENT_CLINIC_OR_DEPARTMENT_OTHER): Payer: Self-pay

## 2013-09-07 ENCOUNTER — Encounter (HOSPITAL_BASED_OUTPATIENT_CLINIC_OR_DEPARTMENT_OTHER): Payer: Self-pay | Admitting: Emergency Medicine

## 2013-09-07 DIAGNOSIS — R569 Unspecified convulsions: Secondary | ICD-10-CM

## 2013-09-07 DIAGNOSIS — Y929 Unspecified place or not applicable: Secondary | ICD-10-CM | POA: Insufficient documentation

## 2013-09-07 DIAGNOSIS — J45909 Unspecified asthma, uncomplicated: Secondary | ICD-10-CM | POA: Insufficient documentation

## 2013-09-07 DIAGNOSIS — IMO0002 Reserved for concepts with insufficient information to code with codable children: Secondary | ICD-10-CM | POA: Insufficient documentation

## 2013-09-07 DIAGNOSIS — J3489 Other specified disorders of nose and nasal sinuses: Secondary | ICD-10-CM | POA: Insufficient documentation

## 2013-09-07 DIAGNOSIS — Z87442 Personal history of urinary calculi: Secondary | ICD-10-CM | POA: Insufficient documentation

## 2013-09-07 DIAGNOSIS — W503XXA Accidental bite by another person, initial encounter: Secondary | ICD-10-CM | POA: Insufficient documentation

## 2013-09-07 DIAGNOSIS — G40909 Epilepsy, unspecified, not intractable, without status epilepticus: Secondary | ICD-10-CM | POA: Insufficient documentation

## 2013-09-07 DIAGNOSIS — Y9389 Activity, other specified: Secondary | ICD-10-CM | POA: Insufficient documentation

## 2013-09-07 LAB — URINALYSIS, ROUTINE W REFLEX MICROSCOPIC
Bilirubin Urine: NEGATIVE
Glucose, UA: NEGATIVE mg/dL
Hgb urine dipstick: NEGATIVE
Ketones, ur: NEGATIVE mg/dL
Leukocytes, UA: NEGATIVE
Nitrite: NEGATIVE
Protein, ur: NEGATIVE mg/dL
Specific Gravity, Urine: 1.027 (ref 1.005–1.030)
Urobilinogen, UA: 0.2 mg/dL (ref 0.0–1.0)
pH: 6 (ref 5.0–8.0)

## 2013-09-07 LAB — BASIC METABOLIC PANEL
Calcium: 10 mg/dL (ref 8.4–10.5)
Chloride: 103 mEq/L (ref 96–112)
Creatinine, Ser: 0.7 mg/dL (ref 0.50–1.10)
GFR calc Af Amer: >90 mL/min (ref >90)
Glucose, Bld: 109 mg/dL — ABNORMAL HIGH (ref 70–99)
Potassium: 3.7 mEq/L (ref 3.5–5.1)
Sodium: 139 mEq/L (ref 135–145)

## 2013-09-07 LAB — CBC WITH DIFFERENTIAL/PLATELET
Basophils Absolute: 0 K/uL (ref 0.0–0.1)
Basophils Relative: 0 % (ref 0–1)
Eosinophils Absolute: 0 K/uL (ref 0.0–0.7)
Eosinophils Relative: 0 % (ref 0–5)
HCT: 41.1 % (ref 36.0–46.0)
Hemoglobin: 13.4 g/dL (ref 12.0–15.0)
Lymphocytes Relative: 7 % — ABNORMAL LOW (ref 12–46)
Lymphs Abs: 1 K/uL (ref 0.7–4.0)
MCH: 27.9 pg (ref 26.0–34.0)
MCHC: 32.6 g/dL (ref 30.0–36.0)
MCV: 85.4 fL (ref 78.0–100.0)
Monocytes Absolute: 0.4 K/uL (ref 0.1–1.0)
Monocytes Relative: 3 % (ref 3–12)
Neutro Abs: 12.7 K/uL — ABNORMAL HIGH (ref 1.7–7.7)
Neutrophils Relative %: 90 % — ABNORMAL HIGH (ref 43–77)
Platelets: 264 K/uL (ref 150–400)
RBC: 4.81 MIL/uL (ref 3.87–5.11)
RDW: 12.9 % (ref 11.5–15.5)
WBC: 14 K/uL — ABNORMAL HIGH (ref 4.0–10.5)

## 2013-09-07 LAB — PHENYTOIN LEVEL, TOTAL: Phenytoin Lvl: 3.5 ug/mL — ABNORMAL LOW (ref 10.0–20.0)

## 2013-09-07 MED ORDER — ACETAMINOPHEN 325 MG PO TABS
ORAL_TABLET | ORAL | Status: AC
  Administered 2013-09-07: 650 mg via ORAL
  Filled 2013-09-07: qty 2

## 2013-09-07 MED ORDER — PHENYTOIN SODIUM EXTENDED 100 MG PO CAPS: 300.0000 mg | ORAL_CAPSULE | Freq: Every day | ORAL | Status: DC

## 2013-09-07 MED ORDER — PHENYTOIN SODIUM EXTENDED 100 MG PO CAPS
300.0000 mg | ORAL_CAPSULE | Freq: Once | ORAL | Status: AC
  Administered 2013-09-07: 300 mg via ORAL
  Filled 2013-09-07: qty 3

## 2013-09-07 MED ORDER — ACETAMINOPHEN 325 MG PO TABS
650.0000 mg | ORAL_TABLET | Freq: Once | ORAL | Status: AC
  Administered 2013-09-07: 650 mg via ORAL

## 2013-09-07 NOTE — ED Notes (Signed)
Patient here reporting that she had 1 seizure last pm and 2 today. Reports mouth pain and headache. Alert and oriented, hx of seizures

## 2013-09-07 NOTE — ED Notes (Signed)
Patient transported to X-ray 

## 2013-09-07 NOTE — ED Provider Notes (Signed)
CSN: [id number]     Arrival date & time 09/07/13  1441 History  This chart was scribed for Tammy Crease, MD by Greggory Stallion, ED Scribe. This patient was seen in room MH09/MH09 and the patient's care was started at 3:00 PM.   Chief Complaint  Patient presents with  . Seizures   HPI HPI Comments: Tammy [name] Tammy [name] is a 47 y.o. female who presents to the Emergency Department complaining of a seizure that occurred earlier today. Family and pt are unsure of how long the seizure lasted. Seizure was unwitnessed. Patient reports that she woke up in bed with leading from her tongue. She also was incontinent of urine. Pt's last seizure was last night but she normally only has them about once a month. Pt was in bed when the seizure occurred and she states she bit her tongue. She states she now has a headache. She is currently getting over cough, congestion and rhinorrhea. Pt states she is also under stress with school. She denies any other associated symptoms.   Past Medical History  Diagnosis Date  . Seizures   . Headache(784.0)   . Asthma   . Kidney stones    Past Surgical History  Procedure Laterality Date  . Hernia repair    . Uterine fibroid surgery     No family history on file. History  Substance Use Topics  . Smoking status: Never Smoker   . Smokeless tobacco: Not on file  . Alcohol Use: No   OB History   Grav Para Term Preterm Abortions TAB SAB Ect Mult Living   3    1 0 1   2     Review of Systems  Neurological: Positive for seizures and headaches.  All other systems reviewed and are negative.    Allergies  Review of patient's allergies indicates no known allergies.  Home Medications   Current Outpatient Rx  Name  Route  Sig  Dispense  Refill  . phenytoin (DILANTIN) 100 MG ER capsule   Oral   Take 300 mg by mouth daily.         . phenytoin (DILANTIN) 100 MG ER capsule      Take three tablets at night   90 capsule   3    BP 145/100  Pulse 82   Temp(Src) 97.8 F (36.6 C) (Oral)  Resp 20  SpO2 99%  Physical Exam  Constitutional: She is oriented to person, place, and time. She appears well-developed and well-nourished. No distress.  HENT:  Head: Normocephalic and atraumatic.  Right Ear: Hearing normal.  Left Ear: Hearing normal.  Nose: Nose normal.  Mouth/Throat: Oropharynx is clear and moist and mucous membranes are normal.  Abrasion on right side of tongue.   Eyes: Conjunctivae and EOM are normal. Pupils are equal, round, and reactive to light.  Neck: Normal range of motion. Neck supple.  Cardiovascular: Regular rhythm, S1 normal and S2 normal.  Exam reveals no gallop and no friction rub.   No murmur heard. Pulmonary/Chest: Effort normal and breath sounds normal. No respiratory distress. She exhibits no tenderness.  Abdominal: Soft. Normal appearance and bowel sounds are normal. There is no hepatosplenomegaly. There is no tenderness. There is no rebound, no guarding, no tenderness at McBurney's point and negative Murphy's sign. No hernia.  Musculoskeletal: Normal range of motion.  Neurological: She is alert and oriented to person, place, and time. She has normal strength. No cranial nerve deficit or sensory deficit. Coordination normal. GCS eye subscore  is 4. GCS verbal subscore is 5. GCS motor subscore is 6.  Skin: Skin is warm, dry and intact. No rash noted. No cyanosis.  Psychiatric: She has a normal mood and affect. Her speech is normal and behavior is normal. Thought content normal.    ED Course  Procedures (including critical care time)  DIAGNOSTIC STUDIES: Oxygen Saturation is 99% on RA, normal by my interpretation.    COORDINATION OF CARE: 3:02 PM-Discussed treatment plan which includes labs with pt at bedside and pt agreed to plan.   Labs Review Labs Reviewed  CBC WITH DIFFERENTIAL - Abnormal; Notable for the following:    WBC 14.0 (*)    Neutrophils Relative % 90 (*)    Neutro Abs 12.7 (*)    Lymphocytes  Relative 7 (*)    All other components within normal limits  BASIC METABOLIC PANEL - Abnormal; Notable for the following:    Glucose, Bld 109 (*)    All other components within normal limits  URINALYSIS, ROUTINE W REFLEX MICROSCOPIC - Abnormal; Notable for the following:    APPearance CLOUDY (*)    All other components within normal limits  PHENYTOIN LEVEL, TOTAL - Abnormal; Notable for the following:    Phenytoin Lvl 3.5 (*)    All other components within normal limits   Imaging Review Dg Chest 2 View  09/07/2013   CLINICAL DATA:  Status post seizure.  EXAM: CHEST  2 VIEW  COMPARISON:  PA and lateral chest 06/18/2009.  FINDINGS: Lungs are clear. No pneumothorax or pleural effusion. Heart size is upper normal. No focal bony abnormality.  IMPRESSION: No acute disease.   Electronically Signed   By: Drusilla Kanner M.D.   On: 09/07/2013 16:06    EKG Interpretation   None       MDM  Diagnosis: Seizure  History presents to the ER for evaluation of seizures. Patient does have a known seizure disorder. She reports that she has not missed any of her Dilantin medication. She has been under increased stress, however, as she is a Naval architect for tests. She has had decreased sleep. She also has had a recent upper respiratory infection including cough and chest congestion. Workup performed, unremarkable. Dilantin level is low. Patient given 300 milligrams here in the ER, will continue home use.    I personally performed the services described in this documentation, which was scribed in my presence. The recorded information has been reviewed and is accurate.    Tammy Crease, MD 09/07/13 [phone]

## 2013-09-18 ENCOUNTER — Encounter (HOSPITAL_BASED_OUTPATIENT_CLINIC_OR_DEPARTMENT_OTHER): Payer: Self-pay | Admitting: Emergency Medicine

## 2013-09-18 ENCOUNTER — Emergency Department (HOSPITAL_BASED_OUTPATIENT_CLINIC_OR_DEPARTMENT_OTHER): Payer: Self-pay

## 2013-09-18 ENCOUNTER — Emergency Department (HOSPITAL_BASED_OUTPATIENT_CLINIC_OR_DEPARTMENT_OTHER)
Admission: EM | Admit: 2013-09-18 | Discharge: 2013-09-18 | Disposition: A | Discharge status: Home / Self Care | Payer: Self-pay | Attending: Emergency Medicine | Admitting: Emergency Medicine

## 2013-09-18 DIAGNOSIS — E669 Obesity, unspecified: Secondary | ICD-10-CM | POA: Insufficient documentation

## 2013-09-18 DIAGNOSIS — G40909 Epilepsy, unspecified, not intractable, without status epilepticus: Secondary | ICD-10-CM | POA: Insufficient documentation

## 2013-09-18 DIAGNOSIS — I8002 Phlebitis and thrombophlebitis of superficial vessels of left lower extremity: Secondary | ICD-10-CM

## 2013-09-18 DIAGNOSIS — J45909 Unspecified asthma, uncomplicated: Secondary | ICD-10-CM | POA: Insufficient documentation

## 2013-09-18 DIAGNOSIS — Z87442 Personal history of urinary calculi: Secondary | ICD-10-CM | POA: Insufficient documentation

## 2013-09-18 DIAGNOSIS — I8000 Phlebitis and thrombophlebitis of superficial vessels of unspecified lower extremity: Secondary | ICD-10-CM | POA: Insufficient documentation

## 2013-09-18 DIAGNOSIS — I824Z2 Acute embolism and thrombosis of unspecified deep veins of left distal lower extremity: Secondary | ICD-10-CM

## 2013-09-18 DIAGNOSIS — I82409 Acute embolism and thrombosis of unspecified deep veins of unspecified lower extremity: Secondary | ICD-10-CM | POA: Insufficient documentation

## 2013-09-18 MED ORDER — HYDROCODONE-ACETAMINOPHEN 5-325 MG PO TABS: 1.0000 | ORAL_TABLET | ORAL | Status: DC | PRN

## 2013-09-18 MED ORDER — IBUPROFEN 600 MG PO TABS: 600.0000 mg | ORAL_TABLET | Freq: Three times a day (TID) | ORAL | Status: DC

## 2013-09-18 MED ORDER — WARFARIN SODIUM 5 MG PO TABS: 5.0000 mg | ORAL_TABLET | Freq: Every day | ORAL | Status: DC

## 2013-09-18 MED ORDER — ENOXAPARIN SODIUM 100 MG/ML ~~LOC~~ SOLN: 1.0000 mg/kg | Freq: Two times a day (BID) | SUBCUTANEOUS | Status: DC

## 2013-09-18 MED ORDER — HYDROCODONE-ACETAMINOPHEN 5-325 MG PO TABS
1.0000 | ORAL_TABLET | Freq: Once | ORAL | Status: AC
  Administered 2013-09-18: 1 via ORAL
  Filled 2013-09-18: qty 1

## 2013-09-18 MED ORDER — ENOXAPARIN SODIUM 100 MG/ML ~~LOC~~ SOLN
1.0000 mg/kg | Freq: Once | SUBCUTANEOUS | Status: AC
  Administered 2013-09-18: 100 mg via SUBCUTANEOUS
  Filled 2013-09-18: qty 1

## 2013-09-18 MED ORDER — HYDROCODONE-ACETAMINOPHEN 5-325 MG PO TABS
ORAL_TABLET | ORAL | Status: AC
  Filled 2013-09-18: qty 1

## 2013-09-18 NOTE — Progress Notes (Signed)
Received call from Joann in the pharmacy/ED at [facility] re: pt having DVT and needing medication assistance with Lovenox. Confirmed she has no insurance and can pay $3 copay. Pt to need Lovenox 20mg  BID x 10 days. Enrolled pt in [facility] program and emailed letter back to [place]. Pt was planning to use the outpatient pharmacy located in the building there. Called Joann to confirm receipt of the letter via email.

## 2013-09-18 NOTE — ED Notes (Signed)
Pt returned from ultrasound

## 2013-09-18 NOTE — ED Provider Notes (Signed)
CSN: [id number]     Arrival date & time 09/18/13  6295 History   First MD Initiated Contact with Patient 09/18/13 [phone]     Chief Complaint  Patient presents with  . Leg Pain   (Consider location/radiation/quality/duration/timing/severity/associated sxs/prior Treatment) HPI Comments: 47 year old female with 3 days of atraumatic left leg pain. States pain is in her calf and thigh. Today she woke up with a warm spot to the medial aspect of her leg just below her knee is a little erythematous. The pain hurts worse when she is walking. She's not had any other trouble walking and is able to her weight. She denied fevers or chills. She states she had a similar problem 4 years ago and had an ultrasound done at [facility]. She states that when the ultrasound was done the sonographer told her she had "a blood clot" but the Dr. told her she did not. She was never on any anticoagulation for this. Been taking 600 mg of ibuprofen which helps the pain some. Currently she rates her pain as 8/10   Past Medical History  Diagnosis Date  . Seizures   . Headache(784.0)   . Asthma   . Kidney stones    Past Surgical History  Procedure Laterality Date  . Hernia repair    . Uterine fibroid surgery     No family history on file. History  Substance Use Topics  . Smoking status: Never Smoker   . Smokeless tobacco: Not on file  . Alcohol Use: No   OB History   Grav Para Term Preterm Abortions TAB SAB Ect Mult Living   3    1 0 1   2     Review of Systems  Constitutional: Negative for fever and chills.  Respiratory: Negative for shortness of breath.   Cardiovascular: Negative for chest pain and leg swelling.  Musculoskeletal:       Left leg pain  Skin:       Redness and warmth to the left leg  Neurological: Negative for weakness and numbness.  All other systems reviewed and are negative.    Allergies  Review of patient's allergies indicates no known allergies.  Home Medications   Current  Outpatient Rx  Name  Route  Sig  Dispense  Refill  . phenytoin (DILANTIN) 100 MG ER capsule   Oral   Take 3 capsules (300 mg total) by mouth at bedtime.   90 capsule   0    BP 164/116  Pulse 88  Temp(Src) 97.4 F (36.3 C) (Oral)  Resp 20  Ht 5\' 2"  (1.575 m)  Wt 225 lb (102.059 kg)  BMI 41.14 kg/m2  SpO2 98%  LMP 09/07/2013 Physical Exam  Nursing note and vitals reviewed. Constitutional: She is oriented to person, place, and time. She appears well-developed and well-nourished. No distress.  Obese  HENT:  Head: Normocephalic and atraumatic.  Right Ear: External ear normal.  Left Ear: External ear normal.  Nose: Nose normal.  Mouth/Throat: Oropharynx is clear and moist.  Neck: Neck supple.  Cardiovascular: Normal rate, regular rhythm, normal heart sounds and intact distal pulses.   Pulses:      Dorsalis pedis pulses are 2+ on the right side, and 2+ on the left side.       Posterior tibial pulses are 2+ on the right side, and 2+ on the left side.  Pulmonary/Chest: Effort normal and breath sounds normal. She has no wheezes. She has no rales.  Abdominal: She exhibits no  distension.  Musculoskeletal: She exhibits no edema.       Legs: Neurological: She is alert and oriented to person, place, and time. She has normal strength. No sensory deficit.  Skin: Skin is warm and dry. She is not diaphoretic. There is erythema. No pallor.    ED Course  Procedures (including critical care time) Labs Review Labs Reviewed - No data to display Imaging Review US Venous Img Lower Unilateral Left  09/18/2013   CLINICAL DATA:  Left foot pain radiates up the calf.  EXAM: VENOUS DOPPLER ULTRASOUND OF LEFT LOWER EXTREMITY  TECHNIQUE: Gray-scale sonography with graded compression, as well as color Doppler and duplex ultrasound, were performed to evaluate the deep venous system from the level of the common femoral vein through the popliteal and proximal calf veins. Spectral Doppler was utilized to  evaluate flow at rest and with distal augmentation maneuvers.  COMPARISON:  None.  FINDINGS: Thrombus within deep veins: Branch of the posterior tibial vein is occluded and noncompressible from the ankle to the upper calf. The common femoral vein, profundus vein, superficial femoral vein, and popliteal vein are patent. The visualized peroneal vein is patent. The anterior tibial vein is not evaluated.  Compressibility of deep veins: The occluded branch of the posterior tibial vein is noncompressible. The common femoral vein, superficial femoral vein, and popliteal vein are compressible.  Duplex waveform respiratory phasicity: The patent vessels show normal phasicity. The occluded branch of the posterior tibial vein shows no blood flow.  Duplex waveform response to augmentation: The patent vessel show normal augmentation. The occluded branch of the posterior tibial vein shows no blood flow.  Venous reflux:  None visualized.  Other findings: Occlusion of the saphenous system from the upper calf to the distal thigh. Saphenous vein is noncompressible.  IMPRESSION: 1. Deep vein thrombosis involving a branch of the posterior tibial vein. 2. Occluded saphenous system. 3. The findings were discussed with Sade Hollon on 09/18/2013 at 9:53 AM.   Electronically Signed   By: Rosalie Gums M.D.   On: 09/18/2013 09:54    EKG Interpretation   None       MDM   1. Calf DVT (deep venous thrombosis), left   2. Superficial thrombophlebitis of left leg    No chest sx to suggest PE. No hypoxia or other systemic sx. The erythema and localized swelling is likely from the thrombophlebitis. Given that this just started it seems unlikely this is also infected. Will treat symptomatically and discussed strict return precautions. For her DVT will start her on lovenox (given one dose here) and coumadin. She has been on these before. Discussed bleeding precautions and PE precautions.     Audree Camel, MD 09/18/13 [phone]

## 2013-09-18 NOTE — ED Notes (Signed)
Left leg pain x 3 days denies injury

## 2013-10-02 ENCOUNTER — Other Ambulatory Visit: Payer: Self-pay

## 2013-10-27 ENCOUNTER — Emergency Department (HOSPITAL_BASED_OUTPATIENT_CLINIC_OR_DEPARTMENT_OTHER)
Admission: EM | Admit: 2013-10-27 | Discharge: 2013-10-27 | Disposition: A | Discharge status: Home / Self Care | Payer: Self-pay | Attending: Emergency Medicine | Admitting: Emergency Medicine

## 2013-10-27 ENCOUNTER — Encounter (HOSPITAL_BASED_OUTPATIENT_CLINIC_OR_DEPARTMENT_OTHER): Payer: Self-pay | Admitting: Emergency Medicine

## 2013-10-27 DIAGNOSIS — R799 Abnormal finding of blood chemistry, unspecified: Secondary | ICD-10-CM | POA: Insufficient documentation

## 2013-10-27 DIAGNOSIS — Z791 Long term (current) use of non-steroidal anti-inflammatories (NSAID): Secondary | ICD-10-CM | POA: Insufficient documentation

## 2013-10-27 DIAGNOSIS — Z87442 Personal history of urinary calculi: Secondary | ICD-10-CM | POA: Insufficient documentation

## 2013-10-27 DIAGNOSIS — G40909 Epilepsy, unspecified, not intractable, without status epilepticus: Secondary | ICD-10-CM | POA: Insufficient documentation

## 2013-10-27 DIAGNOSIS — J45909 Unspecified asthma, uncomplicated: Secondary | ICD-10-CM | POA: Insufficient documentation

## 2013-10-27 DIAGNOSIS — R7889 Finding of other specified substances, not normally found in blood: Secondary | ICD-10-CM

## 2013-10-27 DIAGNOSIS — Z7901 Long term (current) use of anticoagulants: Secondary | ICD-10-CM | POA: Insufficient documentation

## 2013-10-27 DIAGNOSIS — Z79899 Other long term (current) drug therapy: Secondary | ICD-10-CM | POA: Insufficient documentation

## 2013-10-27 DIAGNOSIS — R569 Unspecified convulsions: Secondary | ICD-10-CM

## 2013-10-27 DIAGNOSIS — R51 Headache: Secondary | ICD-10-CM | POA: Insufficient documentation

## 2013-10-27 LAB — BASIC METABOLIC PANEL
BUN: 12 mg/dL (ref 6–23)
Calcium: 9.1 mg/dL (ref 8.4–10.5)
GFR calc Af Amer: >90 mL/min (ref >90)
GFR calc non Af Amer: 88 mL/min — ABNORMAL LOW (ref >90)
Sodium: 141 mEq/L (ref 135–145)

## 2013-10-27 LAB — CBC
MCH: 28.1 pg (ref 26.0–34.0)
MCHC: 32.5 g/dL (ref 30.0–36.0)
MCV: 86.3 fL (ref 78.0–100.0)
Platelets: 231 10*3/uL (ref 150–400)
WBC: 7 10*3/uL (ref 4.0–10.5)

## 2013-10-27 LAB — PHENYTOIN LEVEL, TOTAL: Phenytoin Lvl: <2.5 ug/mL — ABNORMAL LOW (ref 10.0–20.0)

## 2013-10-27 MED ORDER — PHENYTOIN SODIUM 50 MG/ML IJ SOLN: 1000.0000 mg | Freq: Once | INTRAMUSCULAR | Status: DC

## 2013-10-27 MED ORDER — PHENYTOIN SODIUM EXTENDED 100 MG PO CAPS: 300.0000 mg | ORAL_CAPSULE | Freq: Every day | ORAL | Status: DC

## 2013-10-27 MED ORDER — PHENYTOIN SODIUM EXTENDED 100 MG PO CAPS
400.0000 mg | ORAL_CAPSULE | Freq: Once | ORAL | Status: AC
  Administered 2013-10-27: 400 mg via ORAL
  Filled 2013-10-27: qty 4

## 2013-10-27 MED ORDER — IBUPROFEN 800 MG PO TABS
800.0000 mg | ORAL_TABLET | Freq: Once | ORAL | Status: AC
  Administered 2013-10-27: 800 mg via ORAL
  Filled 2013-10-27: qty 1

## 2013-10-27 MED ORDER — SODIUM CHLORIDE 0.9 % IV SOLN: 600.0000 mg | Freq: Once | INTRAVENOUS | Status: AC

## 2013-10-27 MED ORDER — PHENYTOIN SODIUM 50 MG/ML IJ SOLN
INTRAMUSCULAR | Status: AC
  Administered 2013-10-27: 600 mg
  Filled 2013-10-27: qty 12

## 2013-10-27 NOTE — ED Notes (Signed)
Pt reports that she thinks that she is 'in the middle of a seizure' at this time.  Pt A/O x 4.  No distress noted.  In incontinence, no tongue injury noted.

## 2013-10-27 NOTE — ED Notes (Signed)
Dilantin 600mg  in pyxis-discussed with [facility] and EDP Linker-EDP orders for 600mg  IV and 400mg  PO 1-2 hours after infusion

## 2013-10-27 NOTE — ED Notes (Signed)
Dilantin IV complete-EDP Linker notified-pt aware dilantin po in 2 hours then d/c-pt doing school work-NAD

## 2013-10-27 NOTE — ED Provider Notes (Signed)
CSN: [id number]     Arrival date & time 10/27/13  1409 History  This chart was scribed for Ethelda Chick, MD by Joaquin Music, ED Scribe. This [name] was seen in room MH09/MH09 and Tammy [name]'s care was started at  3:05 PM  Chief Complaint  [name] presents with  . Seizures   [name] is a 47 y.o. female presenting with seizures. Tammy history is provided by Tammy [name]. No language interpreter was used.  Seizures Seizure activity on arrival: no   Seizure type:  Unable to specify Preceding symptoms: headache   Episode characteristics: generalized shaking, partial responsiveness and tongue biting   Postictal symptoms: memory loss   Return to baseline: yes   Severity:  Mild Duration:  5 minutes Timing:  Once Recent head injury:  No recent head injuries History of seizures: yes    HPI Comments: [name] is a 47 y.o. female who presents to [facility] complaining of seizure that began taking place PTA. Pt states she "felt she was about to have a seizure". Pt states she began biting her tongue and loss her train of thought. She states she is unable to find her dilantin since last night. Pt states she is currently feeling "tired". Pt states she does not currently have a PCP to receive her dilantin rx. Pt denies recent fevers or illnesses.she has hx of seizure disorder.  No recent illness, no fever or vomiting.  She has run out of her dilantin.   Pt state she was taking Coumadin due to blood clots. She states she finished her Warfrin 3 days ago.  Past Medical History  Diagnosis Date  . Seizures   . Headache(784.0)   . Asthma   . Kidney stones    Past Surgical History  Procedure Laterality Date  . Hernia repair    . Uterine fibroid surgery     History reviewed. No pertinent family history. History  Substance Use Topics  . Smoking status: Never Smoker   . Smokeless tobacco: Not on file  . Alcohol Use: No   OB History   Grav Para Term Preterm  Abortions TAB SAB Ect Mult Living   3    1 0 1   2     Review of Systems  Neurological: Positive for seizures and headaches.  All other systems reviewed and are negative.    Allergies  Review of [name]'s allergies indicates no known allergies.  Home Medications   Current Outpatient Rx  Name  Route  Sig  Dispense  Refill  . enoxaparin (LOVENOX) 100 MG/ML injection   Subcutaneous   Inject 1 mL (100 mg total) into Tammy skin every 12 (twelve) hours.   20 mL   0   . HYDROcodone-acetaminophen (NORCO) 5-325 MG per tablet   Oral   Take 1-2 tablets by mouth every 4 (four) hours as needed for pain.   20 tablet   0   . ibuprofen (ADVIL,MOTRIN) 600 MG tablet   Oral   Take 1 tablet (600 mg total) by mouth 3 (three) times daily.   21 tablet   0   . phenytoin (DILANTIN) 100 MG ER capsule   Oral   Take 3 capsules (300 mg total) by mouth at bedtime.   90 capsule   0   . warfarin (COUMADIN) 5 MG tablet   Oral   Take 1 tablet (5 mg total) by mouth daily.   30 tablet   0    Triage Vitals:BP 127/77  Pulse 84  Temp(Src) 97.8 F (36.6 C) (Oral)  Resp 18  Ht 5\' 2"  (1.575 m)  Wt 220 lb (99.791 kg)  BMI 40.23 kg/m2  SpO2 98%  Physical Exam  Nursing note and vitals reviewed. Constitutional: She is oriented to person, place, and time. She appears well-developed and well-nourished.  Pt appears somewhat tired.  HENT:  Head: Normocephalic and atraumatic.  Eyes: Pupils are equal, round, and reactive to light.  Cardiovascular: Normal rate, regular rhythm and normal heart sounds.   Pulmonary/Chest: Effort normal.  Abdominal: Soft.  Musculoskeletal: Normal range of motion.  Neurological: She is alert and oriented to person, place, and time.  5/5 strength in major muscle groups of  bilateral upper and lower extremities. Speech normal. No facial asymetry.   note- lungs CTA, MMM, no laceration of tongue, cranial nerves 2-12 tested and intact ED Course  Procedures  DIAGNOSTIC  STUDIES: Oxygen Saturation is 98% on RA, normal by my interpretation.    COORDINATION OF CARE: 3:09 PM-Discussed treatment plan which includes blood work. Will administer Dilantin while in ED. Pt agreed to plan.   Labs Review Labs Reviewed  BASIC METABOLIC PANEL - Abnormal; Notable for Tammy following:    Glucose, Bld 111 (*)    GFR calc non Af Amer 88 (*)    All other components within normal limits  PHENYTOIN LEVEL, TOTAL - Abnormal; Notable for Tammy following:    Phenytoin Lvl <2.5 (*)    All other components within normal limits  CBC   Imaging Review No results found.  EKG Interpretation   None       MDM   1. Seizure   2. Subtherapeutic serum dilantin level    Pt presenting with c/o seizure, she has a subtherapeutic dilantin level.  Given dilantin load- 1gram dilantin IV not available so 600mg  IV given , then 2 hours later 400mg  po given.  Pt advised to arrange for PMD to obtain her prescription.  Given information for followup.  Discharged with strict return precautions.  Pt agreeable with plan.   I personally performed Tammy services described in this documentation, which was scribed in my presence. Tammy recorded information has been reviewed and is accurate.    Ethelda Chick, MD 10/28/13 [phone]

## 2014-02-05 ENCOUNTER — Encounter (HOSPITAL_BASED_OUTPATIENT_CLINIC_OR_DEPARTMENT_OTHER): Payer: Self-pay | Admitting: Emergency Medicine

## 2014-02-05 ENCOUNTER — Emergency Department (HOSPITAL_BASED_OUTPATIENT_CLINIC_OR_DEPARTMENT_OTHER)
Admission: EM | Admit: 2014-02-05 | Discharge: 2014-02-05 | Disposition: A | Discharge status: Home / Self Care | Payer: No Typology Code available for payment source | Attending: Emergency Medicine | Admitting: Emergency Medicine

## 2014-02-05 DIAGNOSIS — R519 Headache, unspecified: Secondary | ICD-10-CM

## 2014-02-05 DIAGNOSIS — R51 Headache: Secondary | ICD-10-CM | POA: Insufficient documentation

## 2014-02-05 DIAGNOSIS — Z87442 Personal history of urinary calculi: Secondary | ICD-10-CM | POA: Insufficient documentation

## 2014-02-05 DIAGNOSIS — G40909 Epilepsy, unspecified, not intractable, without status epilepticus: Secondary | ICD-10-CM | POA: Insufficient documentation

## 2014-02-05 DIAGNOSIS — Z79899 Other long term (current) drug therapy: Secondary | ICD-10-CM | POA: Insufficient documentation

## 2014-02-05 DIAGNOSIS — Z7901 Long term (current) use of anticoagulants: Secondary | ICD-10-CM | POA: Insufficient documentation

## 2014-02-05 DIAGNOSIS — Z791 Long term (current) use of non-steroidal anti-inflammatories (NSAID): Secondary | ICD-10-CM | POA: Insufficient documentation

## 2014-02-05 DIAGNOSIS — J45909 Unspecified asthma, uncomplicated: Secondary | ICD-10-CM | POA: Insufficient documentation

## 2014-02-05 DIAGNOSIS — R110 Nausea: Secondary | ICD-10-CM | POA: Insufficient documentation

## 2014-02-05 DIAGNOSIS — R569 Unspecified convulsions: Secondary | ICD-10-CM

## 2014-02-05 LAB — BASIC METABOLIC PANEL
BUN: 10 mg/dL (ref 6–23)
CALCIUM: 9.5 mg/dL (ref 8.4–10.5)
CO2: 29 meq/L (ref 19–32)
Chloride: 103 mEq/L (ref 96–112)
Creatinine, Ser: 0.8 mg/dL (ref 0.50–1.10)
GFR calc Af Amer: >90 mL/min (ref >90)
GFR calc non Af Amer: 88 mL/min — ABNORMAL LOW (ref >90)
GLUCOSE: 91 mg/dL (ref 70–99)
Potassium: 4.2 mEq/L (ref 3.7–5.3)
SODIUM: 140 meq/L (ref 137–147)

## 2014-02-05 LAB — URINALYSIS, ROUTINE W REFLEX MICROSCOPIC
Bilirubin Urine: NEGATIVE
Glucose, UA: NEGATIVE mg/dL
HGB URINE DIPSTICK: NEGATIVE
Ketones, ur: NEGATIVE mg/dL
Leukocytes, UA: NEGATIVE
Nitrite: NEGATIVE
PROTEIN: NEGATIVE mg/dL
SPECIFIC GRAVITY, URINE: 1.02 (ref 1.005–1.030)
Urobilinogen, UA: 1 mg/dL (ref 0.0–1.0)
pH: 6 (ref 5.0–8.0)

## 2014-02-05 LAB — PHENYTOIN LEVEL, TOTAL

## 2014-02-05 LAB — CBC
HCT: 38.2 % (ref 36.0–46.0)
HEMOGLOBIN: 12.4 g/dL (ref 12.0–15.0)
MCH: 28.4 pg (ref 26.0–34.0)
MCHC: 32.5 g/dL (ref 30.0–36.0)
MCV: 87.6 fL (ref 78.0–100.0)
Platelets: 232 10*3/uL (ref 150–400)
RBC: 4.36 MIL/uL (ref 3.87–5.11)
RDW: 12.4 % (ref 11.5–15.5)
WBC: 7 10*3/uL (ref 4.0–10.5)

## 2014-02-05 MED ORDER — SODIUM CHLORIDE 0.9 % IV BOLUS (SEPSIS)
1000.0000 mL | Freq: Once | INTRAVENOUS | Status: AC
  Administered 2014-02-05: 1000 mL via INTRAVENOUS

## 2014-02-05 MED ORDER — PHENYTOIN SODIUM EXTENDED 100 MG PO CAPS: 300.0000 mg | ORAL_CAPSULE | Freq: Every day | ORAL | Status: DC

## 2014-02-05 MED ORDER — PHENYTOIN SODIUM 50 MG/ML IJ SOLN
INTRAMUSCULAR | Status: AC
  Filled 2014-02-05: qty 20

## 2014-02-05 MED ORDER — METOCLOPRAMIDE HCL 5 MG/ML IJ SOLN
10.0000 mg | Freq: Once | INTRAMUSCULAR | Status: AC
  Administered 2014-02-05: 10 mg via INTRAVENOUS
  Filled 2014-02-05: qty 2

## 2014-02-05 MED ORDER — KETOROLAC TROMETHAMINE 30 MG/ML IJ SOLN
30.0000 mg | Freq: Once | INTRAMUSCULAR | Status: AC
  Administered 2014-02-05: 30 mg via INTRAVENOUS
  Filled 2014-02-05: qty 2

## 2014-02-05 MED ORDER — SODIUM CHLORIDE 0.9 % IV SOLN
1000.0000 mg | Freq: Once | INTRAVENOUS | Status: AC
  Administered 2014-02-05: 1000 mg via INTRAVENOUS

## 2014-02-05 MED ORDER — DIPHENHYDRAMINE HCL 50 MG/ML IJ SOLN
25.0000 mg | Freq: Once | INTRAMUSCULAR | Status: AC
  Administered 2014-02-05: 25 mg via INTRAVENOUS
  Filled 2014-02-05: qty 1

## 2014-02-05 MED ORDER — ONDANSETRON HCL 4 MG/2ML IJ SOLN
4.0000 mg | Freq: Once | INTRAMUSCULAR | Status: AC
  Administered 2014-02-05: 4 mg via INTRAVENOUS
  Filled 2014-02-05: qty 2

## 2014-02-05 NOTE — ED Notes (Signed)
Headache this am when she woke. States she may have had a seizure in the night. She took a Tylenol with temporary relief.

## 2014-02-05 NOTE — Discharge Instructions (Signed)
Return to the ED with any concerns including recurrent seizure activity, fever/chills, vomiting and not able to keep down liquids or medications, changes in vision or speech, decreased level of alertness/lethargy, or any other alarming symptoms

## 2014-02-05 NOTE — ED Notes (Signed)
Pt given rx x 1 for dilantin- pt's daughter called to pick pt up from ED

## 2014-02-05 NOTE — ED Provider Notes (Signed)
CSN: [id number]     Arrival date & time 02/05/14  1725 History  This chart was scribed for Tammy Beards, MD by Elby Beck, ED Scribe. This patient was seen in room MH02/MH02 and the patient's care was started at 6:24 PM.    Chief Complaint  Patient presents with  . Headache    Patient is a 48 y.o. female presenting with headaches. The history is provided by the patient. No language interpreter was used.  Headache Pain location:  L temporal and R temporal Quality:  Unable to specify Radiates to:  Does not radiate Severity currently:  Unable to specify Severity at highest:  Unable to specify Onset quality:  Gradual Duration:  1 day Timing:  Constant Progression:  Waxing and waning Chronicity:  Recurrent Similar to prior headaches: yes   Context comment:  History of seizures with a suspected seizure occuring last night Relieved by:  Nothing Worsened by:  Light Ineffective treatments:  Acetaminophen Associated symptoms: nausea and seizures (suspected)   Associated symptoms: no blurred vision, no fever and no vomiting     HPI Comments: Tammy [name] is a 48 y.o. female with a history of headaches and seizures who presents to the Emergency Department complaining of a headache onset this morning when she awoke from sleep. She states that the headache is located over her bilateral temples. She reports that she has a history of frequent seizures, and that she has a seizure about every 20-30 days. She believes she had a seizure last night, as indicated by waking up with her tongue sore this morning. She denies having any wounds to her tongue and denies noticing any bleeding from her tongue/mouth. She reports that her seizures do not seem to be worsening over time. She also reports that it is typical for her to develop a headache following a seizure. She reports that she tried Acetaminophen earlier today without relief of her headache. She states that she has been eating and drinking  normally. She also reports associated nausea earlier today, but denies any vomiting. She is prescribed Dilantin for her history of seizures and states she has been taking this as prescribed. She denies fever, visual disturbances or any other symptoms. She states that she is not followed by a Neurologist, but is establishing care with a PCP, and has her first appointment on 02/23/14.    Past Medical History  Diagnosis Date  . Seizures   . Headache(784.0)   . Asthma   . Kidney stones    Past Surgical History  Procedure Laterality Date  . Hernia repair    . Uterine fibroid surgery     No family history on file. History  Substance Use Topics  . Smoking status: Never Smoker   . Smokeless tobacco: Not on file  . Alcohol Use: No   OB History   Grav Para Term Preterm Abortions TAB SAB Ect Mult Living   3    1 0 1   2     Review of Systems  Constitutional: Negative for fever.  Eyes: Negative for blurred vision and visual disturbance.  Gastrointestinal: Positive for nausea. Negative for vomiting.  Neurological: Positive for seizures (suspected) and headaches.  All other systems reviewed and are negative.   Allergies  Review of patient's allergies indicates no known allergies.  Home Medications   Current Outpatient Rx  Name  Route  Sig  Dispense  Refill  . enoxaparin (LOVENOX) 100 MG/ML injection   Subcutaneous   Inject 1  mL (100 mg total) into the skin every 12 (twelve) hours.   20 mL   0   . HYDROcodone-acetaminophen (NORCO) 5-325 MG per tablet   Oral   Take 1-2 tablets by mouth every 4 (four) hours as needed for pain.   20 tablet   0   . ibuprofen (ADVIL,MOTRIN) 600 MG tablet   Oral   Take 1 tablet (600 mg total) by mouth 3 (three) times daily.   21 tablet   0   . phenytoin (DILANTIN) 100 MG ER capsule   Oral   Take 3 capsules (300 mg total) by mouth at bedtime.   90 capsule   0   . warfarin (COUMADIN) 5 MG tablet   Oral   Take 1 tablet (5 mg total) by  mouth daily.   30 tablet   0    Triage Vitals: BP 150/111  Pulse 75  Temp(Src) 97.6 F (36.4 C) (Oral)  Resp 18  Ht 5' 2.5" (1.588 m)  Wt 220 lb (99.791 kg)  BMI 39.57 kg/m2  SpO2 100%  LMP 01/11/2014  Physical Exam  Nursing note and vitals reviewed. Constitutional: She is oriented to person, place, and time. She appears well-developed and well-nourished. No distress.  HENT:  Head: Normocephalic and atraumatic.  Eyes: EOM are normal.  Neck: Neck supple. No tracheal deviation present.  Cardiovascular: Normal rate.   Pulmonary/Chest: Effort normal. No respiratory distress.  Musculoskeletal: Normal range of motion.  Neurological: She is alert and oriented to person, place, and time. No cranial nerve deficit.  Cranial nerves tested and intact.    Skin: Skin is warm and dry.  Psychiatric: She has a normal mood and affect. Her behavior is normal.  Note- neuro- strength 5/5 in extremities x 4, sensation intact Lungs- CTAB, no wheezing, CV- RRR no murmur/rub/gallop ED Course  Procedures (including critical care time)  DIAGNOSTIC STUDIES: Oxygen Saturation is 100% on RA, normal by my interpretation.    COORDINATION OF CARE: 6:28 PM- Discussed plan to order medications in the ED. Will also obtain diagnostic lab work. Pt advised of plan for treatment and pt agrees.  Medications  sodium chloride 0.9 % bolus 1,000 mL (0 mLs Intravenous Stopped 02/05/14 2030)  ketorolac (TORADOL) 30 MG/ML injection 30 mg (30 mg Intravenous Given 02/05/14 1923)  metoCLOPramide (REGLAN) injection 10 mg (10 mg Intravenous Given 02/05/14 1920)  diphenhydrAMINE (BENADRYL) injection 25 mg (25 mg Intravenous Given 02/05/14 1920)  phenytoin (DILANTIN) 1,000 mg in sodium chloride 0.9 % 250 mL IVPB (0 mg Intravenous Stopped 02/05/14 2133)  ondansetron (ZOFRAN) injection 4 mg (4 mg Intravenous Given 02/05/14 2013)  phenytoin (DILANTIN) 50 MG/ML injection (  Duplicate [birth year] 4259)   Labs Review Labs Reviewed   BASIC METABOLIC PANEL - Abnormal; Notable for the following:    GFR calc non Af Amer 88 (*)    All other components within normal limits  PHENYTOIN LEVEL, TOTAL - Abnormal; Notable for the following:    Phenytoin Lvl <2.5 (*)    All other components within normal limits  CBC  URINALYSIS, ROUTINE W REFLEX MICROSCOPIC   Imaging Review No results found.   EKG Interpretation None      MDM   Final diagnoses:  Headache  Seizure    Pt presenting with headache after seizure last night.  Her tongue feels sore but no injury seen on exam.  Neuro exam is normal.  Headaches are common after seizures for her.  She endorses taking all dilantin as prescribed,  however dilantin level is 0.  Pt loaded with dilantin and given rx for her meds.  Headache is improved after meds in the ED.  Discharged with strict return precautions.  Pt agreeable with plan.   I personally performed the services described in this documentation, which was scribed in my presence. The recorded information has been reviewed and is accurate.     Tammy Beards, MD 02/09/14 [phone]

## 2014-03-06 ENCOUNTER — Encounter (HOSPITAL_COMMUNITY): Payer: Self-pay | Admitting: Emergency Medicine

## 2014-03-06 ENCOUNTER — Emergency Department (HOSPITAL_COMMUNITY): Payer: No Typology Code available for payment source

## 2014-03-06 ENCOUNTER — Emergency Department (HOSPITAL_COMMUNITY)
Admission: EM | Admit: 2014-03-06 | Discharge: 2014-03-06 | Disposition: A | Discharge status: Home / Self Care | Payer: No Typology Code available for payment source | Attending: Emergency Medicine | Admitting: Emergency Medicine

## 2014-03-06 DIAGNOSIS — J45909 Unspecified asthma, uncomplicated: Secondary | ICD-10-CM | POA: Insufficient documentation

## 2014-03-06 DIAGNOSIS — Z791 Long term (current) use of non-steroidal anti-inflammatories (NSAID): Secondary | ICD-10-CM | POA: Insufficient documentation

## 2014-03-06 DIAGNOSIS — Z7901 Long term (current) use of anticoagulants: Secondary | ICD-10-CM | POA: Insufficient documentation

## 2014-03-06 DIAGNOSIS — R569 Unspecified convulsions: Secondary | ICD-10-CM

## 2014-03-06 DIAGNOSIS — G40909 Epilepsy, unspecified, not intractable, without status epilepticus: Secondary | ICD-10-CM | POA: Insufficient documentation

## 2014-03-06 DIAGNOSIS — Z87442 Personal history of urinary calculi: Secondary | ICD-10-CM | POA: Insufficient documentation

## 2014-03-06 LAB — CBC WITH DIFFERENTIAL/PLATELET
BASOS ABS: 0 10*3/uL (ref 0.0–0.1)
BASOS PCT: 0 % (ref 0–1)
EOS ABS: 0 10*3/uL (ref 0.0–0.7)
Eosinophils Relative: 0 % (ref 0–5)
HCT: 41.3 % (ref 36.0–46.0)
HEMOGLOBIN: 13.6 g/dL (ref 12.0–15.0)
Lymphocytes Relative: 12 % (ref 12–46)
Lymphs Abs: 1.2 10*3/uL (ref 0.7–4.0)
MCH: 28.5 pg (ref 26.0–34.0)
MCHC: 32.9 g/dL (ref 30.0–36.0)
MCV: 86.4 fL (ref 78.0–100.0)
Monocytes Absolute: 0.5 10*3/uL (ref 0.1–1.0)
Monocytes Relative: 5 % (ref 3–12)
NEUTROS ABS: 8.3 10*3/uL — AB (ref 1.7–7.7)
NEUTROS PCT: 83 % — AB (ref 43–77)
Platelets: 248 10*3/uL (ref 150–400)
RBC: 4.78 MIL/uL (ref 3.87–5.11)
RDW: 12.8 % (ref 11.5–15.5)
WBC: 10 10*3/uL (ref 4.0–10.5)

## 2014-03-06 LAB — COMPREHENSIVE METABOLIC PANEL
ALBUMIN: 3.9 g/dL (ref 3.5–5.2)
ALT: 15 U/L (ref 0–35)
AST: 22 U/L (ref 0–37)
Alkaline Phosphatase: 108 U/L (ref 39–117)
BILIRUBIN TOTAL: 0.3 mg/dL (ref 0.3–1.2)
BUN: 13 mg/dL (ref 6–23)
CHLORIDE: 100 meq/L (ref 96–112)
CO2: 23 mEq/L (ref 19–32)
Calcium: 9.7 mg/dL (ref 8.4–10.5)
Creatinine, Ser: 0.7 mg/dL (ref 0.50–1.10)
GFR calc Af Amer: >90 mL/min (ref >90)
GFR calc non Af Amer: >90 mL/min (ref >90)
Glucose, Bld: 82 mg/dL (ref 70–99)
POTASSIUM: 4 meq/L (ref 3.7–5.3)
SODIUM: 139 meq/L (ref 137–147)
TOTAL PROTEIN: 8 g/dL (ref 6.0–8.3)

## 2014-03-06 LAB — PHENYTOIN LEVEL, TOTAL: PHENYTOIN LVL: 5.1 ug/mL — AB (ref 10.0–20.0)

## 2014-03-06 MED ORDER — SODIUM CHLORIDE 0.9 % IV SOLN
1000.0000 mg | Freq: Once | INTRAVENOUS | Status: AC
Start: 2014-03-06 — End: 2014-03-06
  Administered 2014-03-06: 1000 mg via INTRAVENOUS
  Filled 2014-03-06: qty 20

## 2014-03-06 MED ORDER — ONDANSETRON HCL 4 MG/2ML IJ SOLN
4.0000 mg | Freq: Once | INTRAMUSCULAR | Status: AC
  Administered 2014-03-06: 4 mg via INTRAVENOUS
  Filled 2014-03-06: qty 2

## 2014-03-06 NOTE — ED Provider Notes (Signed)
CSN: [id number]     Arrival date & time 03/06/14  1218 History   First MD Initiated Contact with Patient 03/06/14 1236     Chief Complaint  Patient presents with  . Seizures     (Consider location/radiation/quality/duration/timing/severity/associated sxs/prior Treatment) HPI Comments: Patient is a 48 year old female with history of known seizure disorder who is taking dilantin. She presents today after a seizure. She was sitting in her car getting ready to drive to her daughter's when this episode occurred. EMS was called the patient was found to be postictal and somewhat somnolent. She is now waking up and is vomiting. She is complaining of headache.  Patient is a 48 y.o. female presenting with seizures. The history is provided by the patient.  Seizures Seizure activity on arrival: no   Seizure type:  Grand mal Initial focality:  None Episode characteristics: confusion and disorientation   Return to baseline: yes   Severity:  Moderate   Past Medical History  Diagnosis Date  . Seizures   . Headache(784.0)   . Asthma   . Kidney stones    Past Surgical History  Procedure Laterality Date  . Hernia repair    . Uterine fibroid surgery     History reviewed. No pertinent family history. History  Substance Use Topics  . Smoking status: Never Smoker   . Smokeless tobacco: Not on file  . Alcohol Use: No   OB History   Grav Para Term Preterm Abortions TAB SAB Ect Mult Living   3    1 0 1   2     Review of Systems  Neurological: Positive for seizures.  All other systems reviewed and are negative.     Allergies  Review of patient's allergies indicates no known allergies.  Home Medications   Current Outpatient Rx  Name  Route  Sig  Dispense  Refill  . enoxaparin (LOVENOX) 100 MG/ML injection   Subcutaneous   Inject 1 mL (100 mg total) into the skin every 12 (twelve) hours.   20 mL   0   . HYDROcodone-acetaminophen (NORCO) 5-325 MG per tablet   Oral   Take 1-2  tablets by mouth every 4 (four) hours as needed for pain.   20 tablet   0   . ibuprofen (ADVIL,MOTRIN) 600 MG tablet   Oral   Take 1 tablet (600 mg total) by mouth 3 (three) times daily.   21 tablet   0   . phenytoin (DILANTIN) 100 MG ER capsule   Oral   Take 3 capsules (300 mg total) by mouth at bedtime.   90 capsule   0   . warfarin (COUMADIN) 5 MG tablet   Oral   Take 1 tablet (5 mg total) by mouth daily.   30 tablet   0    BP 135/93  Pulse 84  Temp(Src) 98.2 F (36.8 C)  Resp 19  Ht 5\' 2"  (1.575 m)  Wt 220 lb (99.791 kg)  BMI 40.23 kg/m2  SpO2 96%  LMP 01/11/2014 Physical Exam  Nursing note and vitals reviewed. Constitutional: She is oriented to person, place, and time. She appears well-developed and well-nourished. No distress.  HENT:  Head: Normocephalic and atraumatic.  Eyes: EOM are normal. Pupils are equal, round, and reactive to light.  Neck: Normal range of motion. Neck supple.  Cardiovascular: Normal rate and regular rhythm.  Exam reveals no gallop and no friction rub.   No murmur heard. Pulmonary/Chest: Effort normal and breath sounds normal. No  respiratory distress. She has no wheezes.  Abdominal: Soft. Bowel sounds are normal. She exhibits no distension. There is no tenderness.  Musculoskeletal: Normal range of motion.  Neurological: She is alert and oriented to person, place, and time. No cranial nerve deficit. She exhibits normal muscle tone. Coordination normal.  Skin: Skin is warm and dry. She is not diaphoretic.    ED Course  Procedures (including critical care time) Labs Review Labs Reviewed  CBC WITH DIFFERENTIAL  COMPREHENSIVE METABOLIC PANEL  PHENYTOIN LEVEL, TOTAL   Imaging Review No results found.   EKG Interpretation   Date/Time:  Friday March 06 2014 12:36:13 EDT Ventricular Rate:  86 PR Interval:  176 QRS Duration: 82 QT Interval:  367 QTC Calculation: 439 R Axis:   69 Text Interpretation:  Sinus rhythm LAE, consider  biatrial enlargement  Confirmed by Beau Fanny  MD, Eland Lamantia ([id number]) on 03/06/2014 12:59:22 PM      MDM   Final diagnoses:  None    Patient is a 48 year old female with history of seizure disorder. She presents after an apparent seizure that occurred in her car. She has a small abrasion to her tongue however no other apparent trauma. Workup reveals unremarkable blood counts and electrolytes. Her twin level is subtherapeutic at 5. She was given an IV infusion of Dilantin and will be discharged to home.    Veryl Speak, MD 03/06/14 [phone]

## 2014-03-06 NOTE — ED Notes (Addendum)
Patient arrived via [facility] from her vehicle where she was driving to her daughters. Car was parked, patient had her second seizure today. Patient was postictal upon EMS arrival. According to EMS the patients daughter stated her mother has had her medication changed recently and she is not even sure she has been taking it. Upon arrival to ER patient is awake and answering questions. She began to vomit just after being moved to stretcher. She still does not remember anything post seizure. Only injuries noted is a small lac to her tongue with no bleeding at this time.

## 2014-03-06 NOTE — Discharge Instructions (Signed)
Continue your Dilantin as prescribed.  Followup for a recheck for a repeat Dilantin Level next week with your primary Dr. return to the ER if you experience any additional problems or seizures.   Seizure, Adult A seizure is abnormal electrical activity in the brain. Seizures usually last from 30 seconds to 2 minutes. There are various types of seizures. Before a seizure, you may have a warning sensation (aura) that a seizure is about to occur. An aura may include the following symptoms:   Fear or anxiety.  Nausea.  Feeling like the room is spinning (vertigo).  Vision changes, such as seeing flashing lights or spots. Common symptoms during a seizure include:  A change in attention or behavior (altered mental status).  Convulsions with rhythmic jerking movements.  Drooling.  Rapid eye movements.  Grunting.  Loss of bladder and bowel control.  Bitter taste in the mouth.  Tongue biting. After a seizure, you may feel confused and sleepy. You may also have an injury resulting from convulsions during the seizure. HOME CARE INSTRUCTIONS   If you are given medicines, take them exactly as prescribed by your health care provider.  Keep all follow-up appointments as directed by your health care provider.  Do not swim or drive or engage in risky activity during which a seizure could cause further injury to you or others until your health care provider says it is OK.  Get adequate rest.  Teach friends and family what to do if you have a seizure. They should:  Lay you on the ground to prevent a fall.  Put a cushion under your head.  Loosen any tight clothing around your neck.  Turn you on your side. If vomiting occurs, this helps keep your airway clear.  Stay with you until you recover.  Know whether or not you need emergency care. SEEK IMMEDIATE MEDICAL CARE IF:  The seizure lasts longer than 5 minutes.  The seizure is severe or you do not wake up immediately after the  seizure.  You have an altered mental status after the seizure.  You are having more frequent or worsening seizures. Someone should drive you to the emergency department or call local emergency services (911 in [place] MAKE SURE YOU:  Understand these instructions.  Will watch your condition.  Will get help right away if you are not doing well or get worse. Document Released: 11/10/2000 Document Revised: 09/03/2013 Document Reviewed: 06/25/2013 [facility] Patient Information 2014 [place].

## 2014-03-06 NOTE — ED Notes (Signed)
Patient's daughter Tammy [name] [phone].Marland KitchenMarland Kitchen

## 2014-03-06 NOTE — ED Notes (Signed)
Patient transported to CT 

## 2014-03-06 NOTE — ED Notes (Signed)
Patient returned from CT

## 2014-07-13 ENCOUNTER — Emergency Department (HOSPITAL_BASED_OUTPATIENT_CLINIC_OR_DEPARTMENT_OTHER)
Admission: EM | Admit: 2014-07-13 | Discharge: 2014-07-13 | Disposition: A | Discharge status: Home / Self Care | Payer: No Typology Code available for payment source | Attending: Emergency Medicine | Admitting: Emergency Medicine

## 2014-07-13 ENCOUNTER — Encounter (HOSPITAL_BASED_OUTPATIENT_CLINIC_OR_DEPARTMENT_OTHER): Payer: Self-pay | Admitting: Emergency Medicine

## 2014-07-13 ENCOUNTER — Emergency Department (HOSPITAL_BASED_OUTPATIENT_CLINIC_OR_DEPARTMENT_OTHER): Payer: No Typology Code available for payment source

## 2014-07-13 DIAGNOSIS — G40909 Epilepsy, unspecified, not intractable, without status epilepticus: Secondary | ICD-10-CM | POA: Diagnosis not present

## 2014-07-13 DIAGNOSIS — Z7901 Long term (current) use of anticoagulants: Secondary | ICD-10-CM | POA: Insufficient documentation

## 2014-07-13 DIAGNOSIS — Z87440 Personal history of urinary (tract) infections: Secondary | ICD-10-CM | POA: Diagnosis not present

## 2014-07-13 DIAGNOSIS — Z79899 Other long term (current) drug therapy: Secondary | ICD-10-CM | POA: Diagnosis not present

## 2014-07-13 DIAGNOSIS — R51 Headache: Secondary | ICD-10-CM | POA: Diagnosis not present

## 2014-07-13 DIAGNOSIS — J45909 Unspecified asthma, uncomplicated: Secondary | ICD-10-CM | POA: Insufficient documentation

## 2014-07-13 DIAGNOSIS — G43C1 Periodic headache syndromes in child or adult, intractable: Secondary | ICD-10-CM

## 2014-07-13 LAB — RAPID URINE DRUG SCREEN, HOSP PERFORMED
Amphetamines: NOT DETECTED
BENZODIAZEPINES: NOT DETECTED
Barbiturates: NOT DETECTED
Cocaine: NOT DETECTED
Opiates: NOT DETECTED
TETRAHYDROCANNABINOL: POSITIVE — AB

## 2014-07-13 LAB — PHENYTOIN LEVEL, TOTAL: PHENYTOIN LVL: 3.2 ug/mL — AB (ref 10.0–20.0)

## 2014-07-13 MED ORDER — METOCLOPRAMIDE HCL 5 MG/ML IJ SOLN
10.0000 mg | Freq: Once | INTRAMUSCULAR | Status: AC
  Administered 2014-07-13: 10 mg via INTRAMUSCULAR
  Filled 2014-07-13: qty 2

## 2014-07-13 MED ORDER — PHENYTOIN 50 MG PO CHEW
400.0000 mg | CHEWABLE_TABLET | Freq: Three times a day (TID) | ORAL | Status: DC
  Filled 2014-07-13: qty 8

## 2014-07-13 MED ORDER — IBUPROFEN 800 MG PO TABS: 800.0000 mg | ORAL_TABLET | Freq: Three times a day (TID) | ORAL | Status: DC

## 2014-07-13 MED ORDER — KETOROLAC TROMETHAMINE 60 MG/2ML IM SOLN
60.0000 mg | Freq: Once | INTRAMUSCULAR | Status: AC
  Administered 2014-07-13: 60 mg via INTRAMUSCULAR
  Filled 2014-07-13: qty 2

## 2014-07-13 MED ORDER — DEXAMETHASONE SODIUM PHOSPHATE 10 MG/ML IJ SOLN
10.0000 mg | Freq: Once | INTRAMUSCULAR | Status: AC
  Administered 2014-07-13: 10 mg via INTRAMUSCULAR
  Filled 2014-07-13: qty 1

## 2014-07-13 MED ORDER — PHENYTOIN SODIUM EXTENDED 100 MG PO CAPS
300.0000 mg | ORAL_CAPSULE | Freq: Once | ORAL | Status: AC
  Administered 2014-07-13: 300 mg via ORAL
  Filled 2014-07-13: qty 3

## 2014-07-13 NOTE — ED Provider Notes (Signed)
CSN: [id number]     Arrival date & time 07/13/14  0122 History   First MD Initiated Contact with Patient 07/13/14 0407     Chief Complaint  Patient presents with  . Headache     (Consider location/radiation/quality/duration/timing/severity/associated sxs/prior Treatment) Patient is a 48 y.o. female presenting with headaches. The history is provided by the patient.  Headache Pain location:  Frontal Quality:  Dull Radiates to:  Does not radiate Severity currently:  8/10 Severity at highest:  8/10 Onset quality:  Gradual Timing:  Intermittent Progression:  Unchanged Chronicity:  Recurrent Similar to prior headaches: yes   Context: not activity and not exposure to bright light   Relieved by:  Nothing Worsened by:  Nothing tried Ineffective treatments:  None tried Associated symptoms: no blurred vision, no congestion, no diarrhea, no dizziness, no fever, no focal weakness, no loss of balance, no myalgias, no nausea, no neck pain, no neck stiffness, no numbness, no photophobia, no seizures, no sore throat, no syncope and no visual change   Risk factors: lifestyle not sedentary   Was afraid to take her migraine meds so she came here  Past Medical History  Diagnosis Date  . Seizures   . Headache(784.0)   . Asthma   . Kidney stones    Past Surgical History  Procedure Laterality Date  . Hernia repair    . Uterine fibroid surgery     History reviewed. No pertinent family history. History  Substance Use Topics  . Smoking status: Never Smoker   . Smokeless tobacco: Not on file  . Alcohol Use: No   OB History   Grav Para Term Preterm Abortions TAB SAB Ect Mult Living   3    1 0 1   2     Review of Systems  Constitutional: Negative for fever.  HENT: Negative for congestion and sore throat.   Eyes: Negative for blurred vision and photophobia.  Cardiovascular: Negative for syncope.  Gastrointestinal: Negative for nausea and diarrhea.  Musculoskeletal: Negative for myalgias,  neck pain and neck stiffness.  Skin: Negative for rash.  Neurological: Positive for headaches. Negative for dizziness, tremors, focal weakness, seizures, syncope, facial asymmetry, speech difficulty, weakness, light-headedness, numbness and loss of balance.  All other systems reviewed and are negative.     Allergies  Aspirin  Home Medications   Prior to Admission medications   Medication Sig Start Date End Date Taking? Authorizing Provider  HYDROcodone-acetaminophen (NORCO) 5-325 MG per tablet Take 1-2 tablets by mouth every 4 (four) hours as needed for pain. 09/18/13   Ephraim Hamburger, MD  ibuprofen (ADVIL,MOTRIN) 600 MG tablet Take 1 tablet (600 mg total) by mouth 3 (three) times daily. 09/18/13   Ephraim Hamburger, MD  phenytoin (DILANTIN) 100 MG ER capsule Take 3 capsules (300 mg total) by mouth at bedtime. 02/05/14   Threasa Beards, MD  warfarin (COUMADIN) 5 MG tablet Take 1 tablet (5 mg total) by mouth daily. 09/18/13   Ephraim Hamburger, MD   BP 176/105  Pulse 79  Temp(Src) 98.1 F (36.7 C) (Oral)  Resp 20  Ht 5' 2.5" (1.588 m)  Wt 212 lb (96.163 kg)  BMI 38.13 kg/m2  SpO2 98%  LMP 06/10/2014 Physical Exam  Constitutional: She is oriented to person, place, and time. She appears well-developed and well-nourished. No distress.  HENT:  Head: Normocephalic and atraumatic.  Mouth/Throat: Oropharynx is clear and moist.  Eyes: Conjunctivae and EOM are normal.  pinpoint  Neck: Normal range  of motion. Neck supple.  No meningismus  Cardiovascular: Normal rate and regular rhythm.   Pulmonary/Chest: Effort normal and breath sounds normal. She has no wheezes. She has no rales.  Abdominal: Soft. Bowel sounds are normal. There is no tenderness. There is no rebound and no guarding.  Musculoskeletal: Normal range of motion.  Lymphadenopathy:    She has no cervical adenopathy.  Neurological: She is alert and oriented to person, place, and time. She has normal reflexes. She displays  no atrophy, no tremor and normal reflexes. No cranial nerve deficit or sensory deficit. She exhibits normal muscle tone. She displays a negative Romberg sign. She displays no seizure activity. Coordination normal. GCS eye subscore is 4. GCS verbal subscore is 5. GCS motor subscore is 6.  Cn 2-12 intacf  Skin: Skin is warm and dry.  Psychiatric: She has a normal mood and affect.    ED Course  Procedures (including critical care time) Labs Review Labs Reviewed  URINE RAPID DRUG SCREEN (HOSP PERFORMED)  PHENYTOIN LEVEL, TOTAL    Imaging Review No results found.   EKG Interpretation None      MDM   Final diagnoses:  None    No f/c/r.  No rashes no travel nor tick exposure.  Int the setting of waxing and waning HA, doubt SAH.  No indication for LP at this time  Migraine cocktail given.  Dilantin level checked.  Take imitrex at home for your continued migraine symptoms   Toniette Devera K Billie Trager-Rasch, MD 07/13/14 [phone]

## 2014-07-13 NOTE — ED Notes (Signed)
Returned from CT.

## 2014-07-13 NOTE — ED Notes (Signed)
MD aware of b/p. No further orders received.

## 2014-07-13 NOTE — ED Notes (Addendum)
Pt presents to ED with c/o frequent migraine headache, onset yesterday that worsen in the 2 hours. Symptoms: sensitivity to light, nausea/vomiting, and dizziness. Pt alerts and oriented x4 at arrival.

## 2014-07-13 NOTE — Discharge Instructions (Signed)

## 2014-09-28 ENCOUNTER — Encounter (HOSPITAL_BASED_OUTPATIENT_CLINIC_OR_DEPARTMENT_OTHER): Payer: Self-pay | Admitting: Emergency Medicine

## 2015-02-18 ENCOUNTER — Emergency Department (HOSPITAL_COMMUNITY)
Admission: EM | Admit: 2015-02-18 | Discharge: 2015-02-18 | Disposition: A | Discharge status: Home / Self Care | Payer: No Typology Code available for payment source | Attending: Emergency Medicine | Admitting: Emergency Medicine

## 2015-02-18 ENCOUNTER — Encounter (HOSPITAL_COMMUNITY): Payer: Self-pay

## 2015-02-18 DIAGNOSIS — Y9289 Other specified places as the place of occurrence of the external cause: Secondary | ICD-10-CM | POA: Diagnosis not present

## 2015-02-18 DIAGNOSIS — Z7901 Long term (current) use of anticoagulants: Secondary | ICD-10-CM | POA: Diagnosis not present

## 2015-02-18 DIAGNOSIS — S01512A Laceration without foreign body of oral cavity, initial encounter: Secondary | ICD-10-CM | POA: Insufficient documentation

## 2015-02-18 DIAGNOSIS — G40909 Epilepsy, unspecified, not intractable, without status epilepticus: Secondary | ICD-10-CM | POA: Diagnosis present

## 2015-02-18 DIAGNOSIS — Z79899 Other long term (current) drug therapy: Secondary | ICD-10-CM | POA: Insufficient documentation

## 2015-02-18 DIAGNOSIS — Z87442 Personal history of urinary calculi: Secondary | ICD-10-CM | POA: Insufficient documentation

## 2015-02-18 DIAGNOSIS — Y9389 Activity, other specified: Secondary | ICD-10-CM | POA: Insufficient documentation

## 2015-02-18 DIAGNOSIS — Y998 Other external cause status: Secondary | ICD-10-CM | POA: Diagnosis not present

## 2015-02-18 DIAGNOSIS — R569 Unspecified convulsions: Secondary | ICD-10-CM

## 2015-02-18 DIAGNOSIS — X58XXXA Exposure to other specified factors, initial encounter: Secondary | ICD-10-CM | POA: Insufficient documentation

## 2015-02-18 DIAGNOSIS — Z791 Long term (current) use of non-steroidal anti-inflammatories (NSAID): Secondary | ICD-10-CM | POA: Insufficient documentation

## 2015-02-18 DIAGNOSIS — J45909 Unspecified asthma, uncomplicated: Secondary | ICD-10-CM | POA: Diagnosis not present

## 2015-02-18 LAB — CBC WITH DIFFERENTIAL/PLATELET
BASOS ABS: 0 10*3/uL (ref 0.0–0.1)
BASOS PCT: 0 % (ref 0–1)
EOS PCT: 0 % (ref 0–5)
Eosinophils Absolute: 0 10*3/uL (ref 0.0–0.7)
HCT: 40.7 % (ref 36.0–46.0)
Hemoglobin: 12.9 g/dL (ref 12.0–15.0)
Lymphocytes Relative: 9 % — ABNORMAL LOW (ref 12–46)
Lymphs Abs: 1.1 10*3/uL (ref 0.7–4.0)
MCH: 27.9 pg (ref 26.0–34.0)
MCHC: 31.7 g/dL (ref 30.0–36.0)
MCV: 88.1 fL (ref 78.0–100.0)
Monocytes Absolute: 0.5 10*3/uL (ref 0.1–1.0)
Monocytes Relative: 4 % (ref 3–12)
NEUTROS ABS: 9.7 10*3/uL — AB (ref 1.7–7.7)
Neutrophils Relative %: 87 % — ABNORMAL HIGH (ref 43–77)
Platelets: 249 10*3/uL (ref 150–400)
RBC: 4.62 MIL/uL (ref 3.87–5.11)
RDW: 13.2 % (ref 11.5–15.5)
WBC: 11.3 10*3/uL — ABNORMAL HIGH (ref 4.0–10.5)

## 2015-02-18 LAB — BASIC METABOLIC PANEL
Anion gap: 10 (ref 5–15)
BUN: 13 mg/dL (ref 6–23)
CALCIUM: 9.9 mg/dL (ref 8.4–10.5)
CHLORIDE: 102 mmol/L (ref 96–112)
CO2: 27 mmol/L (ref 19–32)
CREATININE: 0.78 mg/dL (ref 0.50–1.10)
GFR calc Af Amer: >90 mL/min (ref >90)
GFR calc non Af Amer: >90 mL/min (ref >90)
Glucose, Bld: 103 mg/dL — ABNORMAL HIGH (ref 70–99)
Potassium: 4 mmol/L (ref 3.5–5.1)
Sodium: 139 mmol/L (ref 135–145)

## 2015-02-18 LAB — PHENYTOIN LEVEL, TOTAL: PHENYTOIN LVL: 6.4 ug/mL — AB (ref 10.0–20.0)

## 2015-02-18 MED ORDER — PHENYTOIN SODIUM EXTENDED 100 MG PO CAPS
400.0000 mg | ORAL_CAPSULE | Freq: Once | ORAL | Status: AC
  Administered 2015-02-18: 400 mg via ORAL
  Filled 2015-02-18: qty 4

## 2015-02-18 MED ORDER — PHENYTOIN SODIUM EXTENDED 100 MG PO CAPS
100.0000 mg | ORAL_CAPSULE | Freq: Once | ORAL | Status: AC
  Administered 2015-02-18: 100 mg via ORAL
  Filled 2015-02-18: qty 1

## 2015-02-18 MED ORDER — ONDANSETRON HCL 4 MG/2ML IJ SOLN
4.0000 mg | Freq: Once | INTRAMUSCULAR | Status: DC
  Filled 2015-02-18: qty 2

## 2015-02-18 MED ORDER — ONDANSETRON 4 MG PO TBDP
4.0000 mg | ORAL_TABLET | Freq: Once | ORAL | Status: AC
  Administered 2015-02-18: 4 mg via ORAL
  Filled 2015-02-18: qty 1

## 2015-02-18 MED ORDER — SODIUM CHLORIDE 0.9 % IV BOLUS (SEPSIS): 1000.0000 mL | Freq: Once | INTRAVENOUS | Status: DC

## 2015-02-18 MED ORDER — ONDANSETRON 4 MG PO TBDP: 4.0000 mg | ORAL_TABLET | Freq: Three times a day (TID) | ORAL | Status: DC | PRN

## 2015-02-18 NOTE — ED Provider Notes (Signed)
CSN: [id number]     Arrival date & time 02/18/15  1142 History   First MD Initiated Contact with Patient 02/18/15 1156     Chief Complaint  Patient presents with  . Seizures     (Consider location/radiation/quality/duration/timing/severity/associated sxs/prior Treatment) The history is provided by the patient and medical records.   This is a 49 year old female with past medical history significant for seizures, headaches, asthma, kidney stones, presenting to the ED following 2 witnessed seizures. Patient was at her daughter's house resting on the couch when she had 2 tonic-clonic seizures each lasting approx 30 seconds each. She remained on the couch, no head injury or falls.  Patient was postictal upon EMS arrival. On arrival to ED she is awake, alert, and fully oriented. Her only complaint is headache and nausea which are common after seizures. Patient takes Dilantin, states she has been compliant with her medications. She is not currently on any type of anticoagulation (chart reports coumadin however she has been off of this since 2014, was on it dor 6 months due to DVT).  No EtOH or drugs.  Vital signs stable on arrival.  Past Medical History  Diagnosis Date  . Seizures   . Headache(784.0)   . Asthma   . Kidney stones    Past Surgical History  Procedure Laterality Date  . Hernia repair    . Uterine fibroid surgery     No family history on file. History  Substance Use Topics  . Smoking status: Never Smoker   . Smokeless tobacco: Not on file  . Alcohol Use: No   OB History    Gravida Para Term Preterm AB TAB SAB Ectopic Multiple Living   3    1 0 1   2     Review of Systems  Neurological: Positive for seizures.  All other systems reviewed and are negative.   Allergies  Aspirin  Home Medications   Prior to Admission medications   Medication Sig Start Date End Date Taking? Authorizing Provider  phenytoin (DILANTIN) 100 MG ER capsule Take 3 capsules (300 mg total) by  mouth at bedtime. Patient taking differently: Take 200 mg by mouth 2 (two) times daily.  02/05/14  Yes Alfonzo Beers, MD  HYDROcodone-acetaminophen (NORCO) 5-325 MG per tablet Take 1-2 tablets by mouth every 4 (four) hours as needed for pain. 09/18/13   Sherwood Gambler, MD  ibuprofen (ADVIL,MOTRIN) 600 MG tablet Take 1 tablet (600 mg total) by mouth 3 (three) times daily. 09/18/13   Sherwood Gambler, MD  ibuprofen (ADVIL,MOTRIN) 800 MG tablet Take 1 tablet (800 mg total) by mouth 3 (three) times daily. 07/13/14   April Palumbo, MD  warfarin (COUMADIN) 5 MG tablet Take 1 tablet (5 mg total) by mouth daily. 09/18/13   Sherwood Gambler, MD   BP 152/99 mmHg  Pulse 85  Temp(Src) 97.9 F (36.6 C) (Oral)  Resp 18  SpO2 97%   Physical Exam  Constitutional: She is oriented to person, place, and time. She appears well-developed and well-nourished.  HENT:  Head: Normocephalic and atraumatic.  Mouth/Throat: Uvula is midline, oropharynx is clear and moist and mucous membranes are normal. Lacerations present.  No external signs of head trauma, no hemotympanum; Small bite mark left lateral tongue, no active bleeding, dentition intact; mid-face stable  Eyes: Conjunctivae, EOM and lids are normal. Pupils are equal, round, and reactive to light.  Neck: Normal range of motion.  Cardiovascular: Normal rate, regular rhythm and normal heart sounds.   Pulmonary/Chest: Effort  normal and breath sounds normal. No respiratory distress. She has no wheezes.  Abdominal: Soft. Bowel sounds are normal.  Musculoskeletal: Normal range of motion.  Neurological: She is alert and oriented to person, place, and time.  AAOx3, answering questions appropriately; equal strength UE and LE bilaterally; CN grossly intact; moves all extremities appropriately without ataxia; no focal neuro deficits or facial asymmetry appreciated  Skin: Skin is warm and dry.  Psychiatric: She has a normal mood and affect.  Nursing note and vitals  reviewed.   ED Course  Procedures (including critical care time) Labs Review Labs Reviewed  CBC WITH DIFFERENTIAL/PLATELET - Abnormal; Notable for the following:    WBC 11.3 (*)    Neutrophils Relative % 87 (*)    Neutro Abs 9.7 (*)    Lymphocytes Relative 9 (*)    All other components within normal limits  BASIC METABOLIC PANEL - Abnormal; Notable for the following:    Glucose, Bld 103 (*)    All other components within normal limits  PHENYTOIN LEVEL, TOTAL - Abnormal; Notable for the following:    Phenytoin Lvl 6.4 (*)    All other components within normal limits    Imaging Review No results found.   EKG Interpretation   Date/Time:  Thursday February 18 2015 11:59:42 EDT Ventricular Rate:  94 PR Interval:  171 QRS Duration: 83 QT Interval:  342 QTC Calculation: 428 R Axis:   38 Text Interpretation:  Sinus tachycardia Atrial premature complexes Left  atrial enlargement No significant change since last tracing Confirmed by  HARRISON  MD, FORREST ([id number]) on 02/18/2015 12:06:52 PM      MDM   Final diagnoses:  Seizure   48 year old female with history of seizures, presenting with 2 witnessed seizures prior to arrival. Patient was located on couch when seizure occurred, there was no head injury or fall. On arrival, patient is alert and baseline oriented. She does have a small bite mark noted in the left side of her tongue without active bleeding, otherwise head is atraumatic.  Neurologic exam is non-focal.  Will obtain basic labs, dilantin level.  zofran given for nausea.  Lab work as above-- dilantin level low 6.4, suspect this is what caused her breakthrough seizure.  Patient states she has been taking meds as directed, no missed doses.  Patient has remained baseline oriented without neurologic deficits while in the ED.  She is currently tolerating PO without difficulty.  Patient given oral load of dilantin here in ED.  Encouraged to FU with PCP.  Discussed plan with patient,  he/she acknowledged understanding and agreed with plan of care.  Return precautions given for new or worsening symptoms.  Case discussed with attending physician, Dr. Aline Brochure, who agrees with assessment and plan of care.  Larene Pickett, PA-C 02/18/15 [place], MD 02/19/15 [phone]

## 2015-02-18 NOTE — ED Notes (Signed)
MD Harrison at bedside. 

## 2015-02-18 NOTE — Discharge Instructions (Signed)
Your dilantin level was a little low today, we have given you extra dose to get it back to normal range. Take zofran as needed for nausea. Please follow up with your primary care physician. Return to the ED for new concerns.

## 2015-02-18 NOTE — ED Notes (Signed)
Per EMS, Patient has history of controlled Epilepsy. Patient had two witnessed seizures today, both thirty seconds long. Patient was sitting on couch during seizure with no reports of fall or trauma. Patient has been compliant with seizure medication. Patient reports biting lip and one episode of vomiting before arrival. Patient was Post-Ictal upon EMS arrival to house. Vitals per EMS: 100 CBg, 140/80, 80 HR. Pa

## 2015-09-21 ENCOUNTER — Emergency Department (HOSPITAL_COMMUNITY)
Admission: EM | Admit: 2015-09-21 | Discharge: 2015-09-21 | Disposition: A | Discharge status: Home / Self Care | Payer: No Typology Code available for payment source | Attending: Emergency Medicine | Admitting: Emergency Medicine

## 2015-09-21 ENCOUNTER — Emergency Department (HOSPITAL_COMMUNITY): Payer: No Typology Code available for payment source

## 2015-09-21 ENCOUNTER — Encounter (HOSPITAL_COMMUNITY): Payer: Self-pay | Admitting: Physical Medicine and Rehabilitation

## 2015-09-21 DIAGNOSIS — R0789 Other chest pain: Secondary | ICD-10-CM | POA: Insufficient documentation

## 2015-09-21 DIAGNOSIS — Z791 Long term (current) use of non-steroidal anti-inflammatories (NSAID): Secondary | ICD-10-CM | POA: Insufficient documentation

## 2015-09-21 DIAGNOSIS — Z7901 Long term (current) use of anticoagulants: Secondary | ICD-10-CM | POA: Insufficient documentation

## 2015-09-21 DIAGNOSIS — J45901 Unspecified asthma with (acute) exacerbation: Secondary | ICD-10-CM | POA: Insufficient documentation

## 2015-09-21 DIAGNOSIS — M7989 Other specified soft tissue disorders: Secondary | ICD-10-CM | POA: Insufficient documentation

## 2015-09-21 DIAGNOSIS — Z87442 Personal history of urinary calculi: Secondary | ICD-10-CM | POA: Insufficient documentation

## 2015-09-21 LAB — CBC WITH DIFFERENTIAL/PLATELET
BASOS ABS: 0 10*3/uL (ref 0.0–0.1)
BASOS PCT: 0 %
EOS PCT: 2 %
Eosinophils Absolute: 0.1 10*3/uL (ref 0.0–0.7)
HEMATOCRIT: 40.6 % (ref 36.0–46.0)
Hemoglobin: 12.7 g/dL (ref 12.0–15.0)
Lymphocytes Relative: 30 %
Lymphs Abs: 1.8 10*3/uL (ref 0.7–4.0)
MCH: 27.4 pg (ref 26.0–34.0)
MCHC: 31.3 g/dL (ref 30.0–36.0)
MCV: 87.5 fL (ref 78.0–100.0)
MONO ABS: 0.4 10*3/uL (ref 0.1–1.0)
MONOS PCT: 7 %
Neutro Abs: 3.6 10*3/uL (ref 1.7–7.7)
Neutrophils Relative %: 61 %
PLATELETS: 286 10*3/uL (ref 150–400)
RBC: 4.64 MIL/uL (ref 3.87–5.11)
RDW: 13.2 % (ref 11.5–15.5)
WBC: 6 10*3/uL (ref 4.0–10.5)

## 2015-09-21 LAB — COMPREHENSIVE METABOLIC PANEL
ALT: 12 U/L — ABNORMAL LOW (ref 14–54)
ANION GAP: 9 (ref 5–15)
AST: 19 U/L (ref 15–41)
Albumin: 3.7 g/dL (ref 3.5–5.0)
Alkaline Phosphatase: 69 U/L (ref 38–126)
BILIRUBIN TOTAL: 0.3 mg/dL (ref 0.3–1.2)
BUN: 9 mg/dL (ref 6–20)
CHLORIDE: 106 mmol/L (ref 101–111)
CO2: 28 mmol/L (ref 22–32)
Calcium: 9.8 mg/dL (ref 8.9–10.3)
Creatinine, Ser: 0.75 mg/dL (ref 0.44–1.00)
GFR calc Af Amer: >60 mL/min (ref >60)
Glucose, Bld: 91 mg/dL (ref 65–99)
POTASSIUM: 3.8 mmol/L (ref 3.5–5.1)
Sodium: 143 mmol/L (ref 135–145)
TOTAL PROTEIN: 6.8 g/dL (ref 6.5–8.1)

## 2015-09-21 LAB — TROPONIN I

## 2015-09-21 LAB — D-DIMER, QUANTITATIVE (NOT AT ARMC): D DIMER QUANT: 0.29 ug{FEU}/mL (ref 0.00–0.48)

## 2015-09-21 LAB — I-STAT TROPONIN, ED: TROPONIN I, POC: 0 ng/mL (ref 0.00–0.08)

## 2015-09-21 NOTE — ED Provider Notes (Signed)
CSN: [id number]     Arrival date & time 09/21/15  1251 History   First MD Initiated Contact with Patient 09/21/15 1353     Chief Complaint  Patient presents with  . Chest Pain     (Consider location/radiation/quality/duration/timing/severity/associated sxs/prior Treatment) Patient is a 49 y.o. female presenting with chest pain. The history is provided by the patient and medical records. No language interpreter was used.  Chest Pain Pain location:  Substernal area Associated symptoms: shortness of breath   Associated symptoms: no abdominal pain, no back pain, no dizziness, no headache, no nausea, no palpitations, not vomiting and no weakness    Tammy [name] is a 49 y.o. female  Who presents to the Emergency Department complaining of substernal chest pain x1 week that worsened in intensity today. The pain feels sharp 8/10 when it occurs, lasts seconds to minutes, then resolves. She states no consistent pattern to when this will occur- at times the episodes will return in five minutes, at other times she will be symptom free for many hours. She denies trying anything to alleviate the pain and admits that deep breaths make it worse. Associated symptoms include shortness of breath and tingling of left hand.    Past Medical History  Diagnosis Date  . Seizures ([place])   . Headache(784.0)   . Asthma   . Kidney stones    Past Surgical History  Procedure Laterality Date  . Hernia repair    . Uterine fibroid surgery     No family history on file. Social History  Substance Use Topics  . Smoking status: Never Smoker   . Smokeless tobacco: None  . Alcohol Use: No   OB History    Gravida Para Term Preterm AB TAB SAB Ectopic Multiple Living   3    1 0 1   2     Review of Systems  Constitutional: Negative.   HENT: Negative for congestion, hearing loss, rhinorrhea and sore throat.   Eyes: Negative for visual disturbance.  Respiratory: Positive for shortness of breath. Negative for  chest tightness and wheezing.   Cardiovascular: Positive for chest pain and leg swelling. Negative for palpitations.       Leg swelling is chronic issue been occuring off and on since 2014   Gastrointestinal: Negative for nausea, vomiting, abdominal pain, diarrhea and constipation.  Endocrine: Negative for polydipsia and polyuria.  Musculoskeletal: Negative for myalgias, back pain, arthralgias and neck pain.  Skin: Negative for rash.  Neurological: Negative for dizziness, weakness and headaches.      Allergies  Aspirin  Home Medications   Prior to Admission medications   Medication Sig Start Date End Date Taking? Authorizing Provider  HYDROcodone-acetaminophen (NORCO) 5-325 MG per tablet Take 1-2 tablets by mouth every 4 (four) hours as needed for pain. 09/18/13   Sherwood Gambler, MD  ibuprofen (ADVIL,MOTRIN) 600 MG tablet Take 1 tablet (600 mg total) by mouth 3 (three) times daily. 09/18/13   Sherwood Gambler, MD  ibuprofen (ADVIL,MOTRIN) 800 MG tablet Take 1 tablet (800 mg total) by mouth 3 (three) times daily. 07/13/14   April Palumbo, MD  ondansetron (ZOFRAN ODT) 4 MG disintegrating tablet Take 1 tablet (4 mg total) by mouth every 8 (eight) hours as needed for nausea. 02/18/15   Larene Pickett, PA-C  phenytoin (DILANTIN) 100 MG ER capsule Take 3 capsules (300 mg total) by mouth at bedtime. Patient taking differently: Take 200 mg by mouth 2 (two) times daily.  02/05/14   Jana Half  Linker, MD  warfarin (COUMADIN) 5 MG tablet Take 1 tablet (5 mg total) by mouth daily. 09/18/13   Sherwood Gambler, MD   BP 166/95 mmHg  Pulse 50  Temp(Src) 97.3 F (36.3 C) (Oral)  Resp 16  SpO2 95%  LMP  (LMP Unknown) Physical Exam  Constitutional: She is oriented to person, place, and time. She appears well-developed and well-nourished. No distress.  Alert and in no acute distress  HENT:  Head: Normocephalic and atraumatic.  Neck: Normal range of motion. Neck supple.  Cardiovascular: Intact distal pulses.   Exam reveals no gallop and no friction rub.   No murmur Tammy. Extra heart sounds noted consistent with PVC's seen on EKG.   Pulmonary/Chest: Effort normal and breath sounds normal. No respiratory distress. She has no wheezes. She has no rales. She exhibits no tenderness.  Abdominal: Soft. Bowel sounds are normal. She exhibits no mass. There is no rebound and no guarding.  Abdomen soft, non-tender, non-distended Bowel sounds positive in all four quadrants   Musculoskeletal: She exhibits no edema.  Neurological: She is alert and oriented to person, place, and time. No cranial nerve deficit.  Skin: Skin is warm and dry. No rash noted. She is not diaphoretic.  Psychiatric: She has a normal mood and affect. Her behavior is normal. Judgment and thought content normal.    ED Course  Procedures (including critical care time) Labs Review Labs Reviewed  COMPREHENSIVE METABOLIC PANEL - Abnormal; Notable for the following:    ALT 12 (*)    All other components within normal limits  CBC WITH DIFFERENTIAL/PLATELET  Randolm Idol, ED    Imaging Review Dg Chest 2 View  09/21/2015  CLINICAL DATA:  Tingling chest pain for 1 week. Also with tingling over left side of body especially in left finger tips. Ex-smoker. EXAM: CHEST  2 VIEW COMPARISON:  Chest x-ray dated 09/07/2013. FINDINGS: Heart size is upper normal, unchanged. Overall cardiomediastinal silhouette is stable. Lungs are clear. Lung volumes are normal. No pleural effusion. No pneumothorax. Minimal degenerative change within the thoracic spine. No acute osseous abnormality seen. IMPRESSION: Stable chest x-ray. No evidence of acute cardiopulmonary abnormality. Electronically Signed   By: Franki Cabot M.D.   On: 09/21/2015 13:36   I have personally reviewed and evaluated these images and lab results as part of my medical decision-making.   EKG Interpretation   Date/Time:  Tuesday September 21 2015 12:54:57 EDT Ventricular Rate:  82 PR  Interval:  172 QRS Duration: 78 QT Interval:  370 QTC Calculation: 432 R Axis:   146 Text Interpretation:  Sinus rhythm with occasional Premature ventricular  complexes and Premature atrial complexes Possible Left atrial enlargement  Left posterior fascicular block Possible Anterior infarct , age  undetermined No significant change since last tracing Confirmed by  Maryan Rued  MD, Loree Fee ([id number]) on 09/21/2015 2:10:01 PM      MDM   Final diagnoses:  None   Tammy [name] is a 49 yo AAF who presents with atypical chest pain. First troponin negative, CXR shows no acute cardiopulmonary abnormalities. CBC is WND. HEART score of 2: low risk for acute cardiac condition. While she does have a history of a DVT in 2014, she is Well's 1.5 (low risk) for PE. Shortness of breath is exertional. She has been sat of 95-100% while here. Will order to check pulse ox while ambulating.   2:36 PM - O2 sats 100% when in room. Not currently experiencing chest pain.  3:22 PM - Sats  99% while in room. Not currently experiencing chest pain.   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 Patient is a poor historian. History is limited by the mental capacity of the patient, patient's ability to communicate effectively, and overall poor insight.  Patient is unsure about occurences surrounding DVT. Unsure about how long she was on Lovenox and does not remember taking Coumadin. Because of the uncertainty as to why her DVT's occurred and how long she received treatment, d-dimer was ordered to rule out PE Other differentials include chest wall spasm which is more likely or GERD. Lung irritation secondary to asthma was considered. Chart states a PMH of asthma, but patient states she does not have asthma, but has had bronchitis in the past.   4:08 PM Care passed to oncoming provider, case discussed, plan agreed upon. Lives with daughter.  Ozella Almond Loney Domingo,  PA-C    [facility] Katiejo [place], [place] 09/21/15 [place], MD 09/24/15 [phone]

## 2015-09-21 NOTE — ED Provider Notes (Signed)
I assumed care of [place] from [facility], PA-C at [employer]. Patient is a 49 y.o. female who presented for evaluation of Chest Pain  I have reviewed and agree with their documentation of the patient's HPI, ROS, Physical Exam. Please refer to their MDM and documentation for course prior to now. Treatments, Labs, and Imaging personally viewed by myself & considered in my MDM. Patient chest pain resolved prior to arrival. Patient required no interventions and they will not be given ASA or nitro as chest pain atypical.  CXR, labs and first troponin unremarkable.   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  Follow up arranged with PCP on Thursday of this week.   ECG showed occasional PVCs but no concern for ischemia.   Patient lives at home with daughter.  6:00 PM Delta troponin negative and D dimer negative, therefore will discharge to home with PCP follow up.  Clinical Impression:  1. Chest wall pain      Laboratory and Imaging results were personally reviewed by myself and used in the medical decision making of this patient's treatment and disposition. I also reviewed Radiology's interpretation of Imaging.   Patient care discussed with Dr. Ashok Cordia, who oversaw their evaluation & treatment & voiced agreement. House Officer: Voncille Lo, MD, Emergency Medicine.  Voncille Lo, MD 09/21/15 [place], MD 09/22/15 [phone]

## 2015-09-21 NOTE — ED Notes (Signed)
Walked patient to the bathroom with pulse oxy oxygen level went down as low as 92 got up to 99 patient did well

## 2015-09-21 NOTE — ED Notes (Signed)
Pt presents to department for evaluation of midsternal chest pain radiating to L arm. Ongoing x1 week. Describes as sharp sensation across chest, also states numbness/tingling to L hand. 8/10 pain upon arrival to ED. Pt is alert and oriented x4,

## 2015-09-21 NOTE — Discharge Instructions (Signed)

## 2015-09-30 ENCOUNTER — Encounter (HOSPITAL_COMMUNITY): Payer: Self-pay | Admitting: *Deleted

## 2015-09-30 ENCOUNTER — Emergency Department (HOSPITAL_COMMUNITY)
Admission: EM | Admit: 2015-09-30 | Discharge: 2015-09-30 | Disposition: A | Discharge status: Home / Self Care | Payer: No Typology Code available for payment source | Attending: Emergency Medicine | Admitting: Emergency Medicine

## 2015-09-30 DIAGNOSIS — Z79899 Other long term (current) drug therapy: Secondary | ICD-10-CM | POA: Insufficient documentation

## 2015-09-30 DIAGNOSIS — G40909 Epilepsy, unspecified, not intractable, without status epilepticus: Secondary | ICD-10-CM | POA: Insufficient documentation

## 2015-09-30 DIAGNOSIS — J45909 Unspecified asthma, uncomplicated: Secondary | ICD-10-CM | POA: Insufficient documentation

## 2015-09-30 DIAGNOSIS — R112 Nausea with vomiting, unspecified: Secondary | ICD-10-CM | POA: Insufficient documentation

## 2015-09-30 DIAGNOSIS — Z87442 Personal history of urinary calculi: Secondary | ICD-10-CM | POA: Insufficient documentation

## 2015-09-30 LAB — I-STAT CHEM 8, ED
BUN: 8 mg/dL (ref 6–20)
CHLORIDE: 102 mmol/L (ref 101–111)
CREATININE: 0.7 mg/dL (ref 0.44–1.00)
Calcium, Ion: 1.18 mmol/L (ref 1.12–1.23)
GLUCOSE: 122 mg/dL — AB (ref 65–99)
HEMATOCRIT: 42 % (ref 36.0–46.0)
Hemoglobin: 14.3 g/dL (ref 12.0–15.0)
POTASSIUM: 3.5 mmol/L (ref 3.5–5.1)
SODIUM: 139 mmol/L (ref 135–145)
TCO2: 25 mmol/L (ref 0–100)

## 2015-09-30 LAB — PHENYTOIN LEVEL, TOTAL: Phenytoin Lvl: <2.5 ug/mL — ABNORMAL LOW (ref 10.0–20.0)

## 2015-09-30 LAB — CBG MONITORING, ED: GLUCOSE-CAPILLARY: 87 mg/dL (ref 65–99)

## 2015-09-30 MED ORDER — SODIUM CHLORIDE 0.9 % IV SOLN
INTRAVENOUS | Status: DC
  Administered 2015-09-30: 22:00:00 via INTRAVENOUS

## 2015-09-30 MED ORDER — OXYCODONE-ACETAMINOPHEN 5-325 MG PO TABS
ORAL_TABLET | ORAL | Status: AC
  Filled 2015-09-30: qty 1

## 2015-09-30 MED ORDER — ONDANSETRON 4 MG PO TBDP
ORAL_TABLET | ORAL | Status: AC
  Filled 2015-09-30: qty 1

## 2015-09-30 MED ORDER — OXYCODONE-ACETAMINOPHEN 5-325 MG PO TABS
1.0000 | ORAL_TABLET | Freq: Once | ORAL | Status: AC
  Administered 2015-09-30: 1 via ORAL

## 2015-09-30 MED ORDER — SODIUM CHLORIDE 0.9 % IV SOLN
1500.0000 mg | Freq: Once | INTRAVENOUS | Status: AC
  Administered 2015-09-30: 1500 mg via INTRAVENOUS
  Filled 2015-09-30: qty 30

## 2015-09-30 MED ORDER — PROCHLORPERAZINE EDISYLATE 5 MG/ML IJ SOLN
10.0000 mg | Freq: Once | INTRAMUSCULAR | Status: AC
  Administered 2015-09-30: 10 mg via INTRAVENOUS
  Filled 2015-09-30: qty 2

## 2015-09-30 MED ORDER — SODIUM CHLORIDE 0.9 % IV SOLN: 15.0000 mg/kg | Freq: Once | INTRAVENOUS | Status: DC

## 2015-09-30 MED ORDER — ONDANSETRON 4 MG PO TBDP
4.0000 mg | ORAL_TABLET | Freq: Once | ORAL | Status: AC | PRN
  Administered 2015-09-30: 4 mg via ORAL

## 2015-09-30 NOTE — ED Notes (Signed)
Patient with nausea and vomiting in triage

## 2015-09-30 NOTE — ED Provider Notes (Signed)
CSN: [id number]     Arrival date & time 09/30/15  1710 History   First MD Initiated Contact with Patient 09/30/15 2020     Chief Complaint  Patient presents with  . Seizures   HPI Pt presents to the ED with complaint of seizure last night while she was sleeping.  They usually occur when she is asleep.  This morning her tongue is hurting and she has a headache so she thinks she had a seizure.  She was also incontinent of urine.  Today her head is hurting.  The light bothers her eyes.  She has had some nausea and vomiting.  No fevers.   No injury.  She is supposed to be on dilantin.  She stopped taking it a few days ago. Past Medical History  Diagnosis Date  . Seizures ([place])   . Headache(784.0)   . Asthma   . Kidney stones    Past Surgical History  Procedure Laterality Date  . Hernia repair    . Uterine fibroid surgery     History reviewed. No pertinent family history. Social History  Substance Use Topics  . Smoking status: Never Smoker   . Smokeless tobacco: None  . Alcohol Use: No   OB History    Gravida Para Term Preterm AB TAB SAB Ectopic Multiple Living   3    1 0 1   2     Review of Systems  All other systems reviewed and are negative.     Allergies  Aspirin  Home Medications   Prior to Admission medications   Medication Sig Start Date End Date Taking? Authorizing Provider  HYDROcodone-acetaminophen (NORCO) 5-325 MG per tablet Take 1-2 tablets by mouth every 4 (four) hours as needed for pain. 09/18/13  Yes Sherwood Gambler, MD  ondansetron (ZOFRAN ODT) 4 MG disintegrating tablet Take 1 tablet (4 mg total) by mouth every 8 (eight) hours as needed for nausea. 02/18/15  Yes Larene Pickett, PA-C  phenytoin (DILANTIN) 100 MG ER capsule Take 3 capsules (300 mg total) by mouth at bedtime. Patient taking differently: Take 200 mg by mouth 2 (two) times daily.  02/05/14  Yes Alfonzo Beers, MD  topiramate (TOPAMAX) 25 MG tablet Take 50 mg by mouth daily. Take two tablets at  night time   Yes Historical Provider, MD  ibuprofen (ADVIL,MOTRIN) 600 MG tablet Take 1 tablet (600 mg total) by mouth 3 (three) times daily. Patient not taking: Reported on 09/30/2015 09/18/13   Sherwood Gambler, MD  ibuprofen (ADVIL,MOTRIN) 800 MG tablet Take 1 tablet (800 mg total) by mouth 3 (three) times daily. Patient not taking: Reported on 09/30/2015 07/13/14   April Palumbo, MD  warfarin (COUMADIN) 5 MG tablet Take 1 tablet (5 mg total) by mouth daily. Patient not taking: Reported on 09/30/2015 09/18/13   Sherwood Gambler, MD   BP 115/77 mmHg  Pulse 42  Temp(Src) 98.2 F (36.8 C) (Oral)  Resp 18  Wt 228 lb 8 oz (103.647 kg)  SpO2 86%  LMP  (LMP Unknown) Physical Exam  Constitutional: She appears well-developed and well-nourished. No distress.  HENT:  Head: Normocephalic and atraumatic.  Right Ear: External ear normal.  Left Ear: External ear normal.  Bruising on tongue  Eyes: Conjunctivae are normal. Right eye exhibits no discharge. Left eye exhibits no discharge. No scleral icterus.  Neck: Neck supple. No tracheal deviation present.  Cardiovascular: Normal rate, regular rhythm and intact distal pulses.   Pulmonary/Chest: Effort normal and breath sounds normal. No  stridor. No respiratory distress. She has no wheezes. She has no rales.  Abdominal: Soft. Bowel sounds are normal. She exhibits no distension. There is no tenderness. There is no rebound and no guarding.  Musculoskeletal: She exhibits no edema or tenderness.  Neurological: She is alert. She has normal strength. No cranial nerve deficit (no facial droop, extraocular movements intact, no slurred speech) or sensory deficit. She exhibits normal muscle tone. She displays no seizure activity. Coordination normal.  Skin: Skin is warm and dry. No rash noted.  Psychiatric: She has a normal mood and affect.  Nursing note and vitals reviewed.   ED Course  Procedures (including critical care time) Labs Review Labs Reviewed   PHENYTOIN LEVEL, TOTAL - Abnormal; Notable for the following:    Phenytoin Lvl <2.5 (*)    All other components within normal limits  I-STAT CHEM 8, ED - Abnormal; Notable for the following:    Glucose, Bld 122 (*)    All other components within normal limits  CBG MONITORING, ED      MDM   Final diagnoses:  Seizure disorder ([place])    Patient's Dilantin level subtherapeutic. Patient ran out of her medication a few days ago. She does have a prescription waiting for her at the pharmacy. She had no further seizure activity in the emergency room. She was given a gram of Dilantin IV.      Dorie Rank, MD 09/30/15 [phone]

## 2015-09-30 NOTE — Discharge Instructions (Signed)

## 2015-09-30 NOTE — ED Notes (Signed)
Patient with hx of seizure disorder, patient thinks she had seizure in her sleep, patient appears postictal and tongue has been bitten. Patient does not take her medications as prescribed. Pt takes dilantin.

## 2015-10-26 NOTE — Telephone Encounter (Signed)
This encounter was created in error - please disregard.

## 2016-04-18 ENCOUNTER — Emergency Department (HOSPITAL_BASED_OUTPATIENT_CLINIC_OR_DEPARTMENT_OTHER): Payer: Self-pay

## 2016-04-18 ENCOUNTER — Encounter (HOSPITAL_BASED_OUTPATIENT_CLINIC_OR_DEPARTMENT_OTHER): Payer: Self-pay | Admitting: *Deleted

## 2016-04-18 ENCOUNTER — Emergency Department (HOSPITAL_BASED_OUTPATIENT_CLINIC_OR_DEPARTMENT_OTHER)
Admission: EM | Admit: 2016-04-18 | Discharge: 2016-04-18 | Disposition: A | Discharge status: Home / Self Care | Payer: Self-pay | Attending: Emergency Medicine | Admitting: Emergency Medicine

## 2016-04-18 DIAGNOSIS — R1110 Vomiting, unspecified: Secondary | ICD-10-CM | POA: Insufficient documentation

## 2016-04-18 DIAGNOSIS — Y929 Unspecified place or not applicable: Secondary | ICD-10-CM | POA: Insufficient documentation

## 2016-04-18 DIAGNOSIS — Y939 Activity, unspecified: Secondary | ICD-10-CM | POA: Insufficient documentation

## 2016-04-18 DIAGNOSIS — X58XXXA Exposure to other specified factors, initial encounter: Secondary | ICD-10-CM | POA: Insufficient documentation

## 2016-04-18 DIAGNOSIS — R569 Unspecified convulsions: Secondary | ICD-10-CM

## 2016-04-18 DIAGNOSIS — J45909 Unspecified asthma, uncomplicated: Secondary | ICD-10-CM | POA: Insufficient documentation

## 2016-04-18 DIAGNOSIS — Y999 Unspecified external cause status: Secondary | ICD-10-CM | POA: Insufficient documentation

## 2016-04-18 DIAGNOSIS — S00512A Abrasion of oral cavity, initial encounter: Secondary | ICD-10-CM | POA: Insufficient documentation

## 2016-04-18 DIAGNOSIS — G40909 Epilepsy, unspecified, not intractable, without status epilepticus: Secondary | ICD-10-CM | POA: Insufficient documentation

## 2016-04-18 LAB — CBC WITH DIFFERENTIAL/PLATELET
Basophils Absolute: 0 10*3/uL (ref 0.0–0.1)
Basophils Relative: 0 %
Eosinophils Absolute: 0.2 10*3/uL (ref 0.0–0.7)
Eosinophils Relative: 2 %
HCT: 39.6 % (ref 36.0–46.0)
HEMOGLOBIN: 12.8 g/dL (ref 12.0–15.0)
LYMPHS ABS: 1.2 10*3/uL (ref 0.7–4.0)
LYMPHS PCT: 12 %
MCH: 28.1 pg (ref 26.0–34.0)
MCHC: 32.3 g/dL (ref 30.0–36.0)
MCV: 87 fL (ref 78.0–100.0)
Monocytes Absolute: 0.5 10*3/uL (ref 0.1–1.0)
Monocytes Relative: 6 %
NEUTROS PCT: 80 %
Neutro Abs: 7.8 10*3/uL — ABNORMAL HIGH (ref 1.7–7.7)
Platelets: 250 10*3/uL (ref 150–400)
RBC: 4.55 MIL/uL (ref 3.87–5.11)
RDW: 13 % (ref 11.5–15.5)
WBC: 9.7 10*3/uL (ref 4.0–10.5)

## 2016-04-18 LAB — COMPREHENSIVE METABOLIC PANEL
ALK PHOS: 84 U/L (ref 38–126)
ALT: 15 U/L (ref 14–54)
AST: 21 U/L (ref 15–41)
Albumin: 4 g/dL (ref 3.5–5.0)
Anion gap: 5 (ref 5–15)
BUN: 19 mg/dL (ref 6–20)
CALCIUM: 9.4 mg/dL (ref 8.9–10.3)
CO2: 26 mmol/L (ref 22–32)
CREATININE: 0.65 mg/dL (ref 0.44–1.00)
Chloride: 106 mmol/L (ref 101–111)
Glucose, Bld: 96 mg/dL (ref 65–99)
Potassium: 3.8 mmol/L (ref 3.5–5.1)
Sodium: 137 mmol/L (ref 135–145)
TOTAL PROTEIN: 7.5 g/dL (ref 6.5–8.1)
Total Bilirubin: 0.5 mg/dL (ref 0.3–1.2)

## 2016-04-18 LAB — PHENYTOIN LEVEL, TOTAL: Phenytoin Lvl: <2.5 ug/mL — ABNORMAL LOW (ref 10.0–20.0)

## 2016-04-18 MED ORDER — SODIUM CHLORIDE 0.9 % IV BOLUS (SEPSIS)
1000.0000 mL | Freq: Once | INTRAVENOUS | Status: AC
  Administered 2016-04-18: 1000 mL via INTRAVENOUS

## 2016-04-18 MED ORDER — SODIUM CHLORIDE 0.9 % IV SOLN
1000.0000 mg | Freq: Once | INTRAVENOUS | Status: AC
  Administered 2016-04-18: 1000 mg via INTRAVENOUS
  Filled 2016-04-18: qty 20

## 2016-04-18 MED ORDER — PHENYTOIN SODIUM 50 MG/ML IJ SOLN
INTRAMUSCULAR | Status: AC
  Filled 2016-04-18: qty 10

## 2016-04-18 MED ORDER — PROCHLORPERAZINE EDISYLATE 5 MG/ML IJ SOLN
10.0000 mg | Freq: Once | INTRAMUSCULAR | Status: AC
  Administered 2016-04-18: 10 mg via INTRAVENOUS
  Filled 2016-04-18: qty 2

## 2016-04-18 MED ORDER — DIPHENHYDRAMINE HCL 50 MG/ML IJ SOLN
25.0000 mg | Freq: Once | INTRAMUSCULAR | Status: AC
  Administered 2016-04-18: 25 mg via INTRAVENOUS
  Filled 2016-04-18: qty 1

## 2016-04-18 MED ORDER — SODIUM CHLORIDE 0.9 % IV SOLN
500.0000 mg | Freq: Once | INTRAVENOUS | Status: DC
  Filled 2016-04-18: qty 10

## 2016-04-18 MED ORDER — PHENYTOIN SODIUM 50 MG/ML IJ SOLN
INTRAMUSCULAR | Status: AC
  Filled 2016-04-18: qty 5

## 2016-04-18 NOTE — ED Provider Notes (Signed)
CSN: [id number]     Arrival date & time 04/18/16  1830 History  By signing my name below, I, Tammy [name], attest that this documentation has been prepared under the direction and in the presence of Gareth Morgan, MD. Electronically Signed: Irene [name], ED Scribe. 04/18/2016. 8:22 PM.  Chief Complaint  Patient presents with  . Seizures   The history is provided by the patient. No language interpreter was used.  HPI Comments: [name] DICY Tammy [name] is a 50 y.o. Female with a hx of seizures who presents to the Emergency Department complaining of an unwitnessed seizure onset PTA. Pt states that she bit her tongue and vomited. She reports associated headache following the seizure and has been experiencing sinus congestion. Pt is unsure whether or not she hit her head. She typically has bladder and bowel incontinence following a seizure but did not today. She was found by her cousin in the bathroom. Pt reports that she does not remember going to the bathroom or having the seizure. She sat down on the bed after showing her cousin her tongue then could not remember anything after.  Per triage note, pt has not been able to take her medication because she cannot afford it. She states that she last took her medication yesterday. Her last seizure prior to this one was 01/2016. Pt is on Dilantin and Topamax. Pt is not on Coumadin. She denies fever, cough, rhinorrhea, frequency, or dysuria. Pt denies drinking alcohol. She reports that she uses marijuana.   Past Medical History  Diagnosis Date  . Seizures ([place])   . Headache(784.0)   . Asthma   . Kidney stones    Past Surgical History  Procedure Laterality Date  . Hernia repair    . Uterine fibroid surgery     No family history on file. Social History  Substance Use Topics  . Smoking status: Never Smoker   . Smokeless tobacco: None  . Alcohol Use: No   OB History    Gravida Para Term Preterm AB TAB SAB Ectopic Multiple Living   3    1 0 1   2      Review of Systems  Constitutional: Negative for fever.  HENT: Positive for congestion. Negative for rhinorrhea and sore throat.   Eyes: Negative for visual disturbance.  Respiratory: Negative for cough and shortness of breath.   Cardiovascular: Negative for chest pain.  Gastrointestinal: Positive for vomiting. Negative for abdominal pain, diarrhea and constipation.  Genitourinary: Negative for dysuria, frequency and difficulty urinating.  Musculoskeletal: Negative for back pain and neck pain.  Skin: Negative for rash.  Neurological: Positive for seizures, syncope and headaches.   Allergies  Aspirin  Home Medications   Prior to Admission medications   Medication Sig Start Date End Date Taking? Authorizing Provider  HYDROcodone-acetaminophen (NORCO) 5-325 MG per tablet Take 1-2 tablets by mouth every 4 (four) hours as needed for pain. 09/18/13   Sherwood Gambler, MD  ibuprofen (ADVIL,MOTRIN) 600 MG tablet Take 1 tablet (600 mg total) by mouth 3 (three) times daily. Patient not taking: Reported on 09/30/2015 09/18/13   Sherwood Gambler, MD  ibuprofen (ADVIL,MOTRIN) 800 MG tablet Take 1 tablet (800 mg total) by mouth 3 (three) times daily. Patient not taking: Reported on 09/30/2015 07/13/14   April Palumbo, MD  ondansetron (ZOFRAN ODT) 4 MG disintegrating tablet Take 1 tablet (4 mg total) by mouth every 8 (eight) hours as needed for nausea. 02/18/15   Larene Pickett, PA-C  phenytoin (DILANTIN) 100 MG  ER capsule Take 3 capsules (300 mg total) by mouth at bedtime. Patient taking differently: Take 200 mg by mouth 2 (two) times daily.  02/05/14   Alfonzo Beers, MD  topiramate (TOPAMAX) 25 MG tablet Take 50 mg by mouth daily. Take two tablets at night time    Historical Provider, MD  warfarin (COUMADIN) 5 MG tablet Take 1 tablet (5 mg total) by mouth daily. Patient not taking: Reported on 09/30/2015 09/18/13   Sherwood Gambler, MD   BP 130/57 mmHg  Pulse 76  Temp(Src) 97.9 F (36.6 C) (Oral)  Resp  18  Ht 5\' 2"  (1.575 m)  Wt 220 lb (99.791 kg)  BMI 40.23 kg/m2  SpO2 100%  LMP 03/08/2016 Physical Exam  Constitutional: She is oriented to person, place, and time. She appears well-developed and well-nourished.  HENT:  Head: Normocephalic.  Abrasions to the both sides of the tongue  Eyes: Conjunctivae and EOM are normal. Pupils are equal, round, and reactive to light.  Neck: Normal range of motion. Neck supple.  Cardiovascular: Normal rate, regular rhythm and normal heart sounds.  Exam reveals no gallop and no friction rub.   No murmur heard. Pulmonary/Chest: Effort normal and breath sounds normal. She has no wheezes.  Abdominal: Soft. There is no tenderness.  Musculoskeletal: Normal range of motion.  Neurological: She is alert and oriented to person, place, and time. She has normal strength. She displays no tremor and normal reflexes. No cranial nerve deficit or sensory deficit. She displays no seizure activity. Coordination normal. GCS eye subscore is 4. GCS verbal subscore is 5. GCS motor subscore is 6.  Skin: Skin is warm and dry.  Psychiatric: She has a normal mood and affect. Her behavior is normal.  Nursing note and vitals reviewed.   ED Course  Procedures (including critical care time) DIAGNOSTIC STUDIES: Oxygen Saturation is 99% on RA, normal by my interpretation.    COORDINATION OF CARE: 7:09 PM-Discussed treatment plan which includes labs and IV Dilantin with pt at bedside and pt agreed to plan.    Labs Review Labs Reviewed  CBC WITH DIFFERENTIAL/PLATELET - Abnormal; Notable for the following:    Neutro Abs 7.8 (*)    All other components within normal limits  PHENYTOIN LEVEL, TOTAL - Abnormal; Notable for the following:    Phenytoin Lvl <2.5 (*)    All other components within normal limits  COMPREHENSIVE METABOLIC PANEL    Imaging Review Ct Head Wo Contrast  04/18/2016  CLINICAL DATA:  Seizure today, history of seizures EXAM: CT HEAD WITHOUT CONTRAST  TECHNIQUE: Contiguous axial images were obtained from the base of the skull through the vertex without intravenous contrast. COMPARISON:  01/26/2016 FINDINGS: Brain: No intracranial hemorrhage, mass effect or midline shift. No acute cortical infarction. No mass lesion is noted on this unenhanced scan. No intra or extra-axial fluid collection. Vascular: No hyperdense vessel or unexpected calcification. Skull: Negative for fracture or focal lesion. Sinuses/Orbits: No acute findings. Other: None. IMPRESSION: No acute intracranial abnormality.  No significant change. Electronically Signed   By: Lahoma Crocker M.D.   On: 04/18/2016 19:30   I have personally reviewed and evaluated these images and lab results as part of my medical decision-making.   EKG Interpretation None      MDM   Final diagnoses:  Seizure [facility])   50yo female with history of seizures presents with concern for seizure.  Unknown if head trauma and pt with headache so CT ordered showing no acute abnormalities. Labs WNL.  Dilantin level undetectable. Given 1g dilantin load. Consulted case management for help with medications. Pt returned to baseline, headache improved.  Patient discharged in stable condition with understanding of reasons to return.   I personally performed the services described in this documentation, which was scribed in my presence. The recorded information has been reviewed and is accurate.     Gareth Morgan, MD 04/19/16 1229

## 2016-04-18 NOTE — ED Notes (Signed)
Seizure tonight. Her cousin found her in the bathroom. Pt states she missed 4 doses of her seizure medication. She does not have money to buy her medication. She is ambulatory and alert.

## 2016-04-18 NOTE — Care Management Note (Signed)
Case Management Note  Patient Details  Name: Tammy [name] MRN: [id number] Date of Birth: 03/21/1966  Subjective/Objective:      Patient presented to [facility] ED [place] letter  with c/o seizures           Action/Plan: CM consulted concerning medication assistance. As per Dr. Billy Fischer  EDP patient states, she has been off her seizure meds due to affordability. Patient is uninsured and is eleigible for [facility] medication assistance program. Enrolled and printed and faxed to Ramond Dial at [facility] [phone] reviewed how to redeem medications with nurse verbalized understanding and will review with patient prior to discharge. No further ED CM needs identified  Expected Discharge Date:   04/18/2016               Expected Discharge Plan:  Home/Self Care  In-House Referral:     Discharge planning Services  [place] Program, CM Consult  Post Acute Care Choice:    Choice offered to:     DME Arranged:    DME Agency:     HH Arranged:    HH Agency:     Status of Service:  Completed, signed off  Medicare Important Message Given:    Date Medicare IM Given:    Medicare IM give by:    Date Additional Medicare IM Given:    Additional Medicare Important Message give by:     If discussed at [place] of Stay Meetings, dates discussed:    Additional CommentsLaurena Slimmer, RN 04/18/2016, 9:02 PM

## 2016-04-18 NOTE — Discharge Instructions (Signed)

## 2016-06-28 ENCOUNTER — Emergency Department (HOSPITAL_COMMUNITY)
Admission: EM | Admit: 2016-06-28 | Discharge: 2016-06-28 | Disposition: A | Discharge status: Home / Self Care | Payer: Self-pay | Attending: Emergency Medicine | Admitting: Emergency Medicine

## 2016-06-28 ENCOUNTER — Encounter (HOSPITAL_COMMUNITY): Payer: Self-pay

## 2016-06-28 ENCOUNTER — Emergency Department (HOSPITAL_COMMUNITY): Payer: Self-pay

## 2016-06-28 DIAGNOSIS — Z7901 Long term (current) use of anticoagulants: Secondary | ICD-10-CM | POA: Insufficient documentation

## 2016-06-28 DIAGNOSIS — S00532A Contusion of oral cavity, initial encounter: Secondary | ICD-10-CM | POA: Insufficient documentation

## 2016-06-28 DIAGNOSIS — R569 Unspecified convulsions: Secondary | ICD-10-CM

## 2016-06-28 DIAGNOSIS — J45909 Unspecified asthma, uncomplicated: Secondary | ICD-10-CM | POA: Insufficient documentation

## 2016-06-28 DIAGNOSIS — S00531A Contusion of lip, initial encounter: Secondary | ICD-10-CM | POA: Insufficient documentation

## 2016-06-28 DIAGNOSIS — Y929 Unspecified place or not applicable: Secondary | ICD-10-CM | POA: Insufficient documentation

## 2016-06-28 DIAGNOSIS — X58XXXA Exposure to other specified factors, initial encounter: Secondary | ICD-10-CM | POA: Insufficient documentation

## 2016-06-28 DIAGNOSIS — Y939 Activity, unspecified: Secondary | ICD-10-CM | POA: Insufficient documentation

## 2016-06-28 DIAGNOSIS — G40909 Epilepsy, unspecified, not intractable, without status epilepticus: Secondary | ICD-10-CM | POA: Insufficient documentation

## 2016-06-28 DIAGNOSIS — Y999 Unspecified external cause status: Secondary | ICD-10-CM | POA: Insufficient documentation

## 2016-06-28 LAB — COMPREHENSIVE METABOLIC PANEL
ALK PHOS: 88 U/L (ref 38–126)
ALT: 11 U/L — ABNORMAL LOW (ref 14–54)
AST: 22 U/L (ref 15–41)
Albumin: 4.1 g/dL (ref 3.5–5.0)
Anion gap: 9 (ref 5–15)
BILIRUBIN TOTAL: 0.2 mg/dL — AB (ref 0.3–1.2)
BUN: 9 mg/dL (ref 6–20)
CALCIUM: 9.4 mg/dL (ref 8.9–10.3)
CO2: 24 mmol/L (ref 22–32)
CREATININE: 0.78 mg/dL (ref 0.44–1.00)
Chloride: 104 mmol/L (ref 101–111)
GFR calc Af Amer: >60 mL/min (ref >60)
GLUCOSE: 104 mg/dL — AB (ref 65–99)
POTASSIUM: 3.9 mmol/L (ref 3.5–5.1)
Sodium: 137 mmol/L (ref 135–145)
TOTAL PROTEIN: 7.5 g/dL (ref 6.5–8.1)

## 2016-06-28 LAB — CBC WITH DIFFERENTIAL/PLATELET
BASOS ABS: 0 10*3/uL (ref 0.0–0.1)
BASOS PCT: 0 %
EOS ABS: 0 10*3/uL (ref 0.0–0.7)
EOS PCT: 0 %
HCT: 42.8 % (ref 36.0–46.0)
Hemoglobin: 13.4 g/dL (ref 12.0–15.0)
LYMPHS PCT: 7 %
Lymphs Abs: 0.8 10*3/uL (ref 0.7–4.0)
MCH: 27.7 pg (ref 26.0–34.0)
MCHC: 31.3 g/dL (ref 30.0–36.0)
MCV: 88.6 fL (ref 78.0–100.0)
MONO ABS: 0.4 10*3/uL (ref 0.1–1.0)
Monocytes Relative: 4 %
Neutro Abs: 9.7 10*3/uL — ABNORMAL HIGH (ref 1.7–7.7)
Neutrophils Relative %: 89 %
Platelets: 228 10*3/uL (ref 150–400)
RBC: 4.83 MIL/uL (ref 3.87–5.11)
RDW: 13.2 % (ref 11.5–15.5)
WBC: 10.9 10*3/uL — AB (ref 4.0–10.5)

## 2016-06-28 LAB — ETHANOL: Alcohol, Ethyl (B): <5 mg/dL (ref <5)

## 2016-06-28 LAB — PHENYTOIN LEVEL, TOTAL: Phenytoin Lvl: 3.4 ug/mL — ABNORMAL LOW (ref 10.0–20.0)

## 2016-06-28 MED ORDER — PHENYTOIN SODIUM EXTENDED 100 MG PO CAPS: 200.0000 mg | ORAL_CAPSULE | Freq: Two times a day (BID) | ORAL | 1 refills | Status: DC

## 2016-06-28 MED ORDER — SODIUM CHLORIDE 0.9 % IV SOLN
INTRAVENOUS | Status: DC
  Administered 2016-06-28: 125 mL/h via INTRAVENOUS

## 2016-06-28 MED ORDER — SODIUM CHLORIDE 0.9 % IV SOLN
1000.0000 mg | Freq: Once | INTRAVENOUS | Status: AC
  Administered 2016-06-28: 1000 mg via INTRAVENOUS
  Filled 2016-06-28: qty 20

## 2016-06-28 NOTE — ED Triage Notes (Signed)
Pt here reporting possible seizure last night while she was sleeping. Pt reports she woke up and had injury to her tongue and lip. Hx of seizures, treated with dilantin which she is out of and last had Monday due to she ran out of her prescription.

## 2016-06-28 NOTE — ED Provider Notes (Signed)
[place] DEPT Provider Note   CSN: [id number] Arrival date & time: 06/28/16  0711  First Provider Contact:  None       History   Chief Complaint Chief Complaint  Patient presents with  . Seizures    HPI [name] Tammy [name] is a 50 y.o. female.  HPI Pt has history of seizures.  She presents to the ED because of pain in her lip and tongue.  Pt walked herself to the ED.  She thinks she may have had a seizure while sleeping.  She feels like when she has had one.  NO one witnessed a seizure. She has not taken her medication the last couple of days.  History is somewhat limited.  Pt is saying words that do not make sense.    No known recent injuries.  NO fevers or chills. Past Medical History:  Diagnosis Date  . Asthma   . Headache(784.0)   . Kidney stones   . Seizures [facility])     Patient Active Problem List   Diagnosis Date Noted  . DENTAL PAIN 02/12/2009  . DVT, HX OF 01/20/2008  . FIBROIDS, UTERUS 01/16/2008  . COUGH 01/16/2008  . EMBOLISM/THROMBOSIS, DEEP VSL LWR EXTRM NOS 06/03/2007    Past Surgical History:  Procedure Laterality Date  . HERNIA REPAIR    . UTERINE FIBROID SURGERY      OB History    Gravida Para Term Preterm AB Living   3       1 2    SAB TAB Ectopic Multiple Live Births   1 0             Home Medications    Prior to Admission medications   Medication Sig Start Date End Date Taking? Authorizing Provider  topiramate (TOPAMAX) 25 MG tablet Take 50 mg by mouth daily. Take two tablets at night time   Yes Historical Provider, MD  ibuprofen (ADVIL,MOTRIN) 600 MG tablet Take 1 tablet (600 mg total) by mouth 3 (three) times daily. Patient not taking: Reported on 09/30/2015 09/18/13   Sherwood Gambler, MD  ibuprofen (ADVIL,MOTRIN) 800 MG tablet Take 1 tablet (800 mg total) by mouth 3 (three) times daily. Patient not taking: Reported on 09/30/2015 07/13/14   April Palumbo, MD  ondansetron (ZOFRAN ODT) 4 MG disintegrating tablet Take 1 tablet (4 mg total)  by mouth every 8 (eight) hours as needed for nausea. Patient not taking: Reported on 06/28/2016 02/18/15   Larene Pickett, PA-C  phenytoin (DILANTIN) 100 MG ER capsule Take 2 capsules (200 mg total) by mouth 2 (two) times daily. 06/28/16   Dorie Rank, MD  warfarin (COUMADIN) 5 MG tablet Take 1 tablet (5 mg total) by mouth daily. Patient not taking: Reported on 09/30/2015 09/18/13   Sherwood Gambler, MD    Family History History reviewed. No pertinent family history.  Social History Social History  Substance Use Topics  . Smoking status: Never Smoker  . Smokeless tobacco: Never Used  . Alcohol use No     Allergies   Aspirin   Review of Systems Review of Systems  Constitutional: Negative for fever.  Respiratory: Negative for shortness of breath.   Cardiovascular: Negative for chest pain.  Gastrointestinal: Negative for abdominal pain and vomiting.  Neurological: Positive for seizures. Negative for tremors and light-headedness.  Psychiatric/Behavioral: Positive for confusion.  All other systems reviewed and are negative.    Physical Exam Updated Vital Signs BP 117/74   Pulse (!) 38   Temp 97.7 F (36.5  C) (Oral)   Resp (!) 28   Ht 5\' 3"  (1.6 m)   Wt 104.3 kg   LMP 03/08/2016   SpO2 98%   BMI 40.74 kg/m   Physical Exam  Constitutional: She appears well-developed and well-nourished. No distress.  HENT:  Head: Normocephalic.  Right Ear: External ear normal.  Left Ear: External ear normal.  Abrasion/Bruise right tongue and right lower lip  Eyes: Conjunctivae are normal. Right eye exhibits no discharge. Left eye exhibits no discharge. No scleral icterus.  Neck: Neck supple. No tracheal deviation present.  Cardiovascular: Normal rate, regular rhythm and intact distal pulses.   Pulmonary/Chest: Effort normal and breath sounds normal. No stridor. No respiratory distress. She has no wheezes. She has no rales.  Abdominal: Soft. Bowel sounds are normal. She exhibits no  distension. There is no tenderness. There is no rebound and no guarding.  Musculoskeletal: She exhibits no edema or tenderness.  Neurological: She is alert. She has normal strength. No cranial nerve deficit (no facial droop, extraocular movements intact, no slurred speech) or sensory deficit. She exhibits normal muscle tone. She displays no seizure activity. Coordination normal.  No facial droop, equal strength all extremities, difficulty saying certain words, neologisms  Skin: Skin is warm and dry. No rash noted.  Psychiatric: She has a normal mood and affect.  Nursing note and vitals reviewed.    ED Treatments / Results  Labs (all labs ordered are listed, but only abnormal results are displayed) Labs Reviewed  COMPREHENSIVE METABOLIC PANEL - Abnormal; Notable for the following:       Result Value   Glucose, Bld 104 (*)    ALT 11 (*)    Total Bilirubin 0.2 (*)    All other components within normal limits  CBC WITH DIFFERENTIAL/PLATELET - Abnormal; Notable for the following:    WBC 10.9 (*)    Neutro Abs 9.7 (*)    All other components within normal limits  PHENYTOIN LEVEL, TOTAL - Abnormal; Notable for the following:    Phenytoin Lvl 3.4 (*)    All other components within normal limits  ETHANOL    EKG  EKG Interpretation None       Radiology Ct Head Wo Contrast  Result Date: 06/28/2016 CLINICAL DATA:  History of seizures. Non compliance with medication. Seizure this morning. EXAM: CT HEAD WITHOUT CONTRAST TECHNIQUE: Contiguous axial images were obtained from the base of the skull through the vertex without intravenous contrast. COMPARISON:  Multiple priors, most recent 04/18/2016. FINDINGS: Brain: No evidence of acute infarction, hemorrhage, extra-axial collection, ventriculomegaly, or mass effect. Normal cerebral volume. No white matter disease. Vascular: No hyperdense vessel or unexpected calcification. Skull: Negative for fracture or focal lesion. Sinuses/Orbits: No acute  findings. Other: None. IMPRESSION: Negative exam. No acute intracranial findings. No change from priors. Electronically Signed   By: Staci Righter M.D.   On: 06/28/2016 11:08    Procedures Procedures (including critical care time)  Medications Ordered in ED Medications  0.9 %  sodium chloride infusion (125 mL/hr Intravenous New Bag/Given 06/28/16 0802)  phenytoin (DILANTIN) 1,000 mg in sodium chloride 0.9 % 250 mL IVPB (0 mg Intravenous Stopped 06/28/16 0956)     Initial Impression / Assessment and Plan / ED Course  I have reviewed the triage vital signs and the nursing notes.  Pertinent labs & imaging results that were available during my care of the patient were reviewed by me and considered in my medical decision making (see chart for details).  Clinical  Course  Comment By Time  Labs reviewed.  Dilantin level is low.  Will give loading dose Dorie Rank, MD 08/02 [phone]  Rechecked patient.  She is still having difficulty naming objects.  Suspect postictal but will continue to monitor. Dorie Rank, MD 08/02 0957  No acute findings on CT.   Dorie Rank, MD 08/02 1130  Speech is normal.  No aphasia.  No difficulty with word finding Dorie Rank, MD 08/02 1210    Patient most likely had a seizure. She has evidence of lip and tongue injury. Initially she was having difficulty with naming objects.  That is all resolved. I think those symptoms were consistent with a postictal phase. Patient had not been taking her Dilantin medication. I will give her prescription and have her follow up with her primary doctor to have this rechecked.  Final Clinical Impressions(s) / ED Diagnoses   Final diagnoses:  Seizure [facility])    New Prescriptions Current Discharge Medication List    Dilantin refill   Dorie Rank, MD 06/28/16 1213

## 2016-08-14 ENCOUNTER — Emergency Department (HOSPITAL_BASED_OUTPATIENT_CLINIC_OR_DEPARTMENT_OTHER): Payer: Self-pay

## 2016-08-14 ENCOUNTER — Emergency Department (HOSPITAL_BASED_OUTPATIENT_CLINIC_OR_DEPARTMENT_OTHER)
Admission: EM | Admit: 2016-08-14 | Discharge: 2016-08-14 | Disposition: A | Discharge status: Home / Self Care | Payer: Self-pay | Attending: Emergency Medicine | Admitting: Emergency Medicine

## 2016-08-14 ENCOUNTER — Encounter (HOSPITAL_BASED_OUTPATIENT_CLINIC_OR_DEPARTMENT_OTHER): Payer: Self-pay | Admitting: Emergency Medicine

## 2016-08-14 DIAGNOSIS — E010 Iodine-deficiency related diffuse (endemic) goiter: Secondary | ICD-10-CM | POA: Insufficient documentation

## 2016-08-14 DIAGNOSIS — J45909 Unspecified asthma, uncomplicated: Secondary | ICD-10-CM | POA: Insufficient documentation

## 2016-08-14 DIAGNOSIS — M79605 Pain in left leg: Secondary | ICD-10-CM

## 2016-08-14 LAB — CBC WITH DIFFERENTIAL/PLATELET
BASOS ABS: 0 10*3/uL (ref 0.0–0.1)
Basophils Relative: 0 %
Eosinophils Absolute: 0.2 10*3/uL (ref 0.0–0.7)
Eosinophils Relative: 3 %
HEMATOCRIT: 37.1 % (ref 36.0–46.0)
Hemoglobin: 12.2 g/dL (ref 12.0–15.0)
LYMPHS ABS: 1.8 10*3/uL (ref 0.7–4.0)
LYMPHS PCT: 31 %
MCH: 28.8 pg (ref 26.0–34.0)
MCHC: 32.9 g/dL (ref 30.0–36.0)
MCV: 87.5 fL (ref 78.0–100.0)
MONO ABS: 0.6 10*3/uL (ref 0.1–1.0)
Monocytes Relative: 10 %
NEUTROS ABS: 3.2 10*3/uL (ref 1.7–7.7)
Neutrophils Relative %: 56 %
Platelets: 255 10*3/uL (ref 150–400)
RBC: 4.24 MIL/uL (ref 3.87–5.11)
RDW: 13.2 % (ref 11.5–15.5)
WBC: 5.8 10*3/uL (ref 4.0–10.5)

## 2016-08-14 LAB — BASIC METABOLIC PANEL
ANION GAP: 3 — AB (ref 5–15)
BUN: 11 mg/dL (ref 6–20)
CHLORIDE: 107 mmol/L (ref 101–111)
CO2: 31 mmol/L (ref 22–32)
Calcium: 9.2 mg/dL (ref 8.9–10.3)
Creatinine, Ser: 0.69 mg/dL (ref 0.44–1.00)
GFR calc Af Amer: >60 mL/min (ref >60)
GLUCOSE: 98 mg/dL (ref 65–99)
POTASSIUM: 4 mmol/L (ref 3.5–5.1)
Sodium: 141 mmol/L (ref 135–145)

## 2016-08-14 LAB — TSH: TSH: 1.186 u[IU]/mL (ref 0.350–4.500)

## 2016-08-14 NOTE — ED Provider Notes (Signed)
[facility] DEPT [facility] Provider Note   CSN: [id number] Arrival date & time: 08/14/16  1029     History   Chief Complaint Chief Complaint  Patient presents with  . Leg Pain    HPI Tammy [name] is a 50 y.o. female.  HPI   Tammy [name] is a 50 y.o. female with PMH significant for asthma, seizure, and DVT who presents with 1 day hx of gradual onset, constant, mild left leg swelling.  Patient states she was on her feet a lot yesterday and then noticed swelling of her left ankle to thigh with associated tingling, warmth, and mild pain.  Denies injury/trauma. She also states she began experiencing tingling in the fingertips of her left hand.  Denies numbness or weakness.  Ambulatory.  She elevated her leg with some relief.  Aggravating factors include walking.  Denies fever, chills, cough, SOB, or CP.  No unilateral weakness, slurred speech, facial droop, or HA.  No medications PTA. She is not on any anticoagulants. She also states she is worried about a mass on her neck.  Tolerating secretions without difficulty.    Past Medical History:  Diagnosis Date  . Asthma   . Headache(784.0)   . Seizures [facility])     Patient Active Problem List   Diagnosis Date Noted  . DENTAL PAIN 02/12/2009  . DVT, HX OF 01/20/2008  . FIBROIDS, UTERUS 01/16/2008  . COUGH 01/16/2008  . EMBOLISM/THROMBOSIS, DEEP VSL LWR EXTRM NOS 06/03/2007    Past Surgical History:  Procedure Laterality Date  . HERNIA REPAIR    . UTERINE FIBROID SURGERY      OB History    Gravida Para Term Preterm AB Living   3       1 2    SAB TAB Ectopic Multiple Live Births   1 0             Home Medications    Prior to Admission medications   Medication Sig Start Date End Date Taking? Authorizing Provider  ibuprofen (ADVIL,MOTRIN) 600 MG tablet Take 1 tablet (600 mg total) by mouth 3 (three) times daily. Patient not taking: Reported on 09/30/2015 09/18/13   Sherwood Gambler, MD  ibuprofen (ADVIL,MOTRIN) 800 MG  tablet Take 1 tablet (800 mg total) by mouth 3 (three) times daily. Patient not taking: Reported on 09/30/2015 07/13/14   April Palumbo, MD  ondansetron (ZOFRAN ODT) 4 MG disintegrating tablet Take 1 tablet (4 mg total) by mouth every 8 (eight) hours as needed for nausea. Patient not taking: Reported on 06/28/2016 02/18/15   Larene Pickett, PA-C  phenytoin (DILANTIN) 100 MG ER capsule Take 2 capsules (200 mg total) by mouth 2 (two) times daily. 06/28/16   Dorie Rank, MD  topiramate (TOPAMAX) 25 MG tablet Take 50 mg by mouth daily. Take two tablets at night time    Historical Provider, MD  warfarin (COUMADIN) 5 MG tablet Take 1 tablet (5 mg total) by mouth daily. Patient not taking: Reported on 09/30/2015 09/18/13   Sherwood Gambler, MD    Family History No family history on file.  Social History Social History  Substance Use Topics  . Smoking status: Never Smoker  . Smokeless tobacco: Never Used  . Alcohol use No     Allergies   Aspirin   Review of Systems Review of Systems All other systems negative unless otherwise stated in HPI   Physical Exam Updated Vital Signs BP 131/91 (BP Location: Right Arm)   Pulse 73  Temp 97.5 F (36.4 C) (Oral)   Resp 18   Ht 5\' 3"  (1.6 m)   Wt 104.3 kg   LMP 03/08/2016   SpO2 100%   BMI 40.74 kg/m   Physical Exam  Constitutional: She is oriented to person, place, and time. She appears well-developed and well-nourished.  Non-toxic appearance. She does not have a sickly appearance. She does not appear ill.  HENT:  Head: Normocephalic and atraumatic.  Mouth/Throat: Uvula is midline and oropharynx is clear and moist. No oropharyngeal exudate, posterior oropharyngeal edema or posterior oropharyngeal erythema.  Eyes: Conjunctivae are normal. Pupils are equal, round, and reactive to light.  Neck: Normal range of motion. Neck supple. Thyromegaly present.  Cardiovascular: Normal rate and regular rhythm.   Pulses:      Radial pulses are 2+ on the right  side, and 2+ on the left side.       Dorsalis pedis pulses are 2+ on the right side, and 2+ on the left side.  Mild unilateral leg swelling (L>R).  Pulmonary/Chest: Effort normal and breath sounds normal. No accessory muscle usage or stridor. No respiratory distress. She has no wheezes. She has no rhonchi. She has no rales.  Abdominal: Soft. Bowel sounds are normal. She exhibits no distension. There is no tenderness.  Musculoskeletal: Normal range of motion. She exhibits no tenderness.  Lymphadenopathy:    She has no cervical adenopathy.  Neurological: She is alert and oriented to person, place, and time.  Speech clear without dysarthria. Cranial nerves grossly intact.  5/5 strength in upper and lower extremities bilaterally.  Skin: Skin is warm and dry. No erythema.  No palpable cords or varicose veins.   Psychiatric: She has a normal mood and affect. Her behavior is normal.     ED Treatments / Results  Labs (all labs ordered are listed, but only abnormal results are displayed) Labs Reviewed  BASIC METABOLIC PANEL - Abnormal; Notable for the following:       Result Value   Anion gap 3 (*)    All other components within normal limits  CBC WITH DIFFERENTIAL/PLATELET  TSH    EKG  EKG Interpretation None       Radiology US Venous Img Lower Unilateral Left  Result Date: 08/14/2016 CLINICAL DATA:  Left lower leg pain and swelling for 1 day. EXAM: LEFT LOWER EXTREMITY VENOUS DOPPLER ULTRASOUND TECHNIQUE: Gray-scale sonography with graded compression, as well as color Doppler and duplex ultrasound, were performed to evaluate the deep venous system from the level of the common femoral vein through the popliteal and proximal calf veins. Spectral Doppler was utilized to evaluate flow at rest and with distal augmentation maneuvers. COMPARISON:  09/18/2013 FINDINGS: Right common femoral vein is patent without thrombosis. Normal compressibility, augmentation and color Doppler flow in the  left common femoral vein, left femoral vein and left popliteal vein. The left saphenofemoral junction is patent. Left profunda femoral vein is patent without thrombus. Visualized left deep calf veins are patent without thrombus. Visualized left great saphenous vein is patent. Subcutaneous edema in the left ankle. There is a hypoechoic collection in the left posterior fossa. There is no flow within this structure. This popliteal fossa collection measures 7.0 x 1.7 x 4.0 cm. IMPRESSION: Negative for deep venous thrombosis in left lower extremity. Hypoechoic collection in the left popliteal fossa. Findings are compatible with a Baker's cyst. Electronically Signed   By: Markus Daft M.D.   On: 08/14/2016 11:38    Procedures Procedures (including critical  care time)  Medications Ordered in ED Medications - No data to display   Initial Impression / Assessment and Plan / ED Course  I have reviewed the triage vital signs and the nursing notes.  Pertinent labs & imaging results that were available during my care of the patient were reviewed by me and considered in my medical decision making (see chart for details).  Clinical Course   Patient presents with unilateral leg swelling and tingling.  No CP or SOB.  She is ambulatory.  No hx of trauma/injury.  Hx of DVT.  Will obtain lower extremity doppler and check labs. Consider CVA, but no focal neurological deficits, and sx distribution is not in stroke pattern. Consider arterial thrombus, but good pulses, no poikilothermia, or pallor. DVT study negative.  Labs without acute abnormalities.  Patient with thyromegaly, TSH pending.  Possible venous insufficiency, recommend compression stockings.  Return precautions discussed.  Stable for discharge.  Case has been discussed with Dr. Regenia Skeeter who agrees with the above plan for discharge.     Final Clinical Impressions(s) / ED Diagnoses   Final diagnoses:  Left leg pain    New Prescriptions New  Prescriptions   No medications on file     Gloriann Loan, PA-C 08/14/16 [place], MD 08/18/16 [phone]

## 2016-08-14 NOTE — Discharge Instructions (Signed)
Your lab work and DVT study were normal today.  Your thyroid test is pending, and these results will be available later on your MyChart.  If they are abnormal, you will need to follow up with your primary care physician.  Return if you experience any increased pain, swelling, redness, or any new symptoms.

## 2016-08-20 ENCOUNTER — Encounter (HOSPITAL_BASED_OUTPATIENT_CLINIC_OR_DEPARTMENT_OTHER): Payer: Self-pay | Admitting: *Deleted

## 2016-08-20 ENCOUNTER — Emergency Department (HOSPITAL_BASED_OUTPATIENT_CLINIC_OR_DEPARTMENT_OTHER): Payer: Self-pay

## 2016-08-20 ENCOUNTER — Inpatient Hospital Stay (HOSPITAL_BASED_OUTPATIENT_CLINIC_OR_DEPARTMENT_OTHER)
Admission: EM | Admit: 2016-08-20 | Discharge: 2016-08-22 | DRG: 101 | Disposition: A | Discharge status: Home / Self Care | Payer: Self-pay | Attending: Family Medicine | Admitting: Family Medicine

## 2016-08-20 DIAGNOSIS — Z886 Allergy status to analgesic agent status: Secondary | ICD-10-CM

## 2016-08-20 DIAGNOSIS — IMO0001 Reserved for inherently not codable concepts without codable children: Secondary | ICD-10-CM | POA: Diagnosis present

## 2016-08-20 DIAGNOSIS — J45909 Unspecified asthma, uncomplicated: Secondary | ICD-10-CM | POA: Diagnosis present

## 2016-08-20 DIAGNOSIS — R030 Elevated blood-pressure reading, without diagnosis of hypertension: Secondary | ICD-10-CM | POA: Diagnosis present

## 2016-08-20 DIAGNOSIS — R112 Nausea with vomiting, unspecified: Secondary | ICD-10-CM | POA: Diagnosis present

## 2016-08-20 DIAGNOSIS — G40919 Epilepsy, unspecified, intractable, without status epilepticus: Principal | ICD-10-CM | POA: Diagnosis present

## 2016-08-20 DIAGNOSIS — R569 Unspecified convulsions: Secondary | ICD-10-CM

## 2016-08-20 DIAGNOSIS — Z79899 Other long term (current) drug therapy: Secondary | ICD-10-CM

## 2016-08-20 LAB — BASIC METABOLIC PANEL
ANION GAP: 5 (ref 5–15)
BUN: 14 mg/dL (ref 6–20)
CALCIUM: 9.2 mg/dL (ref 8.9–10.3)
CO2: 26 mmol/L (ref 22–32)
CREATININE: 0.69 mg/dL (ref 0.44–1.00)
Chloride: 108 mmol/L (ref 101–111)
GFR calc Af Amer: >60 mL/min (ref >60)
GLUCOSE: 88 mg/dL (ref 65–99)
Potassium: 4 mmol/L (ref 3.5–5.1)
Sodium: 139 mmol/L (ref 135–145)

## 2016-08-20 LAB — CBC WITH DIFFERENTIAL/PLATELET
BASOS ABS: 0 10*3/uL (ref 0.0–0.1)
Basophils Relative: 0 %
EOS PCT: 3 %
Eosinophils Absolute: 0.1 10*3/uL (ref 0.0–0.7)
HEMATOCRIT: 39 % (ref 36.0–46.0)
Hemoglobin: 12.8 g/dL (ref 12.0–15.0)
LYMPHS ABS: 1 10*3/uL (ref 0.7–4.0)
LYMPHS PCT: 19 %
MCH: 28.3 pg (ref 26.0–34.0)
MCHC: 32.8 g/dL (ref 30.0–36.0)
MCV: 86.1 fL (ref 78.0–100.0)
MONO ABS: 0.4 10*3/uL (ref 0.1–1.0)
MONOS PCT: 8 %
NEUTROS ABS: 3.8 10*3/uL (ref 1.7–7.7)
Neutrophils Relative %: 71 %
PLATELETS: 199 10*3/uL (ref 150–400)
RBC: 4.53 MIL/uL (ref 3.87–5.11)
RDW: 13.3 % (ref 11.5–15.5)
WBC: 5.5 10*3/uL (ref 4.0–10.5)

## 2016-08-20 LAB — PHENYTOIN LEVEL, TOTAL: Phenytoin Lvl: 5.6 ug/mL — ABNORMAL LOW (ref 10.0–20.0)

## 2016-08-20 MED ORDER — PHENYTOIN 125 MG/5ML PO SUSP
25.0000 mg | Freq: Once | ORAL | Status: AC
  Administered 2016-08-20: 25 mg via ORAL
  Filled 2016-08-20: qty 4

## 2016-08-20 MED ORDER — PHENYTOIN SODIUM EXTENDED 100 MG PO CAPS
200.0000 mg | ORAL_CAPSULE | Freq: Once | ORAL | Status: AC
  Administered 2016-08-20: 200 mg via ORAL
  Filled 2016-08-20: qty 2

## 2016-08-20 MED ORDER — LORAZEPAM 2 MG/ML IJ SOLN: 1.0000 mg | INTRAMUSCULAR | Status: DC | PRN

## 2016-08-20 MED ORDER — ACETAMINOPHEN 500 MG PO TABS
1000.0000 mg | ORAL_TABLET | Freq: Once | ORAL | Status: AC
  Administered 2016-08-20: 1000 mg via ORAL
  Filled 2016-08-20: qty 2

## 2016-08-20 MED ORDER — SODIUM CHLORIDE 0.9 % IV SOLN
1000.0000 mg | Freq: Once | INTRAVENOUS | Status: DC
  Filled 2016-08-20: qty 10

## 2016-08-20 MED ORDER — PROCHLORPERAZINE EDISYLATE 5 MG/ML IJ SOLN
10.0000 mg | Freq: Once | INTRAMUSCULAR | Status: AC
  Administered 2016-08-20: 10 mg via INTRAVENOUS
  Filled 2016-08-20: qty 2

## 2016-08-20 MED ORDER — SODIUM CHLORIDE 0.9 % IV SOLN
1000.0000 mg | Freq: Once | INTRAVENOUS | Status: AC
  Administered 2016-08-20: 1000 mg via INTRAVENOUS
  Filled 2016-08-20: qty 10

## 2016-08-20 MED ORDER — SODIUM CHLORIDE 0.9 % IV SOLN
500.0000 mg | Freq: Two times a day (BID) | INTRAVENOUS | Status: DC
  Administered 2016-08-20 – 2016-08-21 (×3): 500 mg via INTRAVENOUS
  Filled 2016-08-20 (×5): qty 5

## 2016-08-20 MED ORDER — ONDANSETRON HCL 4 MG/2ML IJ SOLN
4.0000 mg | Freq: Four times a day (QID) | INTRAMUSCULAR | Status: DC
  Administered 2016-08-20 – 2016-08-22 (×7): 4 mg via INTRAVENOUS
  Filled 2016-08-20 (×7): qty 2

## 2016-08-20 MED ORDER — SODIUM CHLORIDE 0.9 % IV SOLN
INTRAVENOUS | Status: AC
  Administered 2016-08-20: 18:00:00 via INTRAVENOUS

## 2016-08-20 MED ORDER — DIPHENHYDRAMINE HCL 50 MG/ML IJ SOLN
12.5000 mg | Freq: Once | INTRAMUSCULAR | Status: AC
  Administered 2016-08-20: 12.5 mg via INTRAVENOUS
  Filled 2016-08-20: qty 1

## 2016-08-20 MED ORDER — ACETAMINOPHEN 325 MG PO TABS: 650.0000 mg | ORAL_TABLET | Freq: Four times a day (QID) | ORAL | Status: DC | PRN

## 2016-08-20 MED ORDER — METOPROLOL TARTRATE 5 MG/5ML IV SOLN
5.0000 mg | Freq: Once | INTRAVENOUS | Status: DC
  Filled 2016-08-20 (×2): qty 5

## 2016-08-20 MED ORDER — ONDANSETRON HCL 4 MG/2ML IJ SOLN: 4.0000 mg | Freq: Three times a day (TID) | INTRAMUSCULAR | Status: DC | PRN

## 2016-08-20 MED ORDER — LEVETIRACETAM 500 MG/5ML IV SOLN
INTRAVENOUS | Status: AC
  Filled 2016-08-20: qty 10

## 2016-08-20 NOTE — ED Notes (Signed)
O2 sats decreased to 85%; pt gazing, unresponsive to verbal stimuli, but eyes open- MD notified

## 2016-08-20 NOTE — ED Notes (Signed)
Pt c/o headache; able to state month, but not year. MD notified. Will medicate pt when she is more alert and able to tolerate po.

## 2016-08-20 NOTE — ED Notes (Signed)
MD at bedside. 

## 2016-08-20 NOTE — Consult Note (Signed)
Neurology Consultation Reason for Consult: Seizures Referring Physician: Rockwell Alexandria  CC: Seizures  History is obtained from: Patient  HPI: [name] Tammy [name] is a 50 y.o. female history of seizures for multiple decades who has never been completely seizure free since with recurrent seizures.  He states that she may have missed a dose of her Dilantin about a week ago, but nothing more recent. She was found to have a Dilantin level of 5.2 on arrival. She was given oral loading of Dilantin, but unfortunately vomited this up. She had multiple recurrent seizures, though she did appear to wake up in between them. She was then given a single dose of IV Keppra and has not had any further seizures since that time.  She has never been very well controlled with her seizures, having 4-5 a month. She has been on Dilantin for quite some time, and was on topiramate at some time as well. When she combined the 2, she felt that she was very sleepy.  She also complains of headache which she states feels just like her migraines.  ROS: A 14 point ROS was performed and is negative except as noted in the HPI.    Past Medical History:  Diagnosis Date  . Asthma   . Headache(784.0)   . Seizures ([place])      Family history: Grandson-seizures  Social History: Denies alcohol, denies cigarettes occasional marijuana, no other drugs  Exam: Current vital signs: BP 141/90 (BP Location: Right Arm)   Pulse 95   Temp 98.5 F (36.9 Tammy) (Oral)   Resp 18   LMP 03/08/2016   SpO2 100%  Vital signs in last 24 hours: Temp:  [97.8 F (36.6 Tammy)-98.5 F (36.9 Tammy)] 98.5 F (36.9 Tammy) (09/24 1730) Pulse Rate:  [62-95] 95 (09/24 1730) Resp:  [16-20] 18 (09/24 1730) BP: (122-162)/(81-108) 141/90 (09/24 1730) SpO2:  [95 %-100 %] 100 % (09/24 1730)   Physical Exam  Constitutional: Appears well-developed and well-nourished.  Psych: Affect appropriate to situation Eyes: No scleral injection HENT: No OP obstrucion Head:  Normocephalic.  Cardiovascular: Normal rate and regular rhythm.  Respiratory: Effort normal and breath sounds normal to anterior ascultation GI: Soft.  No distension. There is no tenderness.  Skin: WDI  Neuro: Mental Status: Patient is awake, alert, oriented to person, place, month, year, and situation. Patient is able to give a clear and coherent history. No signs of aphasia or neglect Cranial Nerves: II: Visual Fields are full. Pupils are equal, round, and reactive to light.   III,IV, VI: EOMI without ptosis or diploplia.  V: Facial sensation is symmetric to temperature VII: Facial movement is symmetric.  VIII: hearing is intact to voice X: Uvula elevates symmetrically XI: Shoulder shrug is symmetric. XII: tongue is midline without atrophy or fasciculations.  Motor: Tone is normal. Bulk is normal. 5/5 strength was present in all four extremities, though she only gave moderate effort Sensory: Sensation is symmetric to light touch and temperature in the arms and legs. Cerebellar: FNF intact bilaterally      I have reviewed labs in epic and the results pertinent to this consultation are: Dilantin 5.2  I have reviewed the images obtained: CT head-unremarkable  Impression: 50 year old female with a history of seizures with multiple breakthrough seizures in the setting of subtherapeutic Dilantin. I discussed with her as including titration of Dilantin, combining Dilantin with Keppra, changing her to a different medication given the long-term side effects of Dilantin and difficulty of titrating it. I would favor given  that she has never been completely controlled trying her on a different antiepileptic and therefore I recommended changing her to [place]. I do think an EEG may be useful, because if she has a generalized seizure disorder then they could be helpful for choosing future medications.  Recommendations: 1) Keppra 500 mg twice a day. I will use IV for now given that she had  vomiting earlier.Marland Kitchen 2) discontinue Dilantin 3) EEG   Roland Rack, MD [facility] [phone]  If 7pm- 7am, please page neurology on call as listed in [place].

## 2016-08-20 NOTE — Progress Notes (Signed)
Pt arrived to 5C12 via [facility].  Pt alert and oriented, complaints of headache.  States she has migraines often and this feels the same.  Alert and oriented, in no apparent distress.  Telemetry applied and CCMD notified.  VSS.  Will continue to monitor.  Cori Razor, RN

## 2016-08-20 NOTE — ED Notes (Signed)
Pt vomited large amt of brown vomit.

## 2016-08-20 NOTE — ED Notes (Signed)
Pt more alert now, but still confused and drowsy.

## 2016-08-20 NOTE — H&P (Addendum)
History and Physical    Tammy [name] [id number] DOB: 12/02/1965 DOA: 08/20/2016  PCP: Garlan Fillers, MD   Patient coming from: [facility]  Chief Complaint: Seizures.  HPI: Tammy [name] is a 50 y.o. female with medical history significant of asthma, headaches, seizure disorder with a long history of seizures who states that she continues to have occasional recurrent seizures and presented to [facility] [place] after she had a seizure at home while in her bed. Her family found her and called EMS. She is on Dilantin 200 mg by mouth twice a day, as stated that she forgot it does last week, but she is usually very compliant with it. The history is limited because the patient is somnolent from earlier doses of Keppra and IV Benadryl.  ED Course: The patient was given a loading dose of oral Dilantin, but vomited this medication. She subsequently had a second seizure and was given 1 g of IV Keppra. The neuro hospitalist team was contacted and suggested admission to [facility]. She was seen by Dr. Leonel Ramsay who is currently treating her with Keppra 500 mg IVPB every 12 hours given the patient's recent emesis. Workup shows unremarkable CBC and CMP, a phenytoin level of 5.6 g/mL and CT scan shows chronic sinus sinusitis, otherwise is normal.  Review of Systems: As per HPI otherwise 10 point review of systems negative.    Past Medical History:  Diagnosis Date  . Asthma   . Headache(784.0)   . Seizures ([place])     Past Surgical History:  Procedure Laterality Date  . HERNIA REPAIR    . UTERINE FIBROID SURGERY       reports that she has never smoked. She has never used smokeless tobacco. She reports that she uses drugs, including Marijuana. She reports that she does not drink alcohol.  Allergies  Allergen Reactions  . Aspirin     Patient unsure of reaction     Family History.   Seizures- Grandson.   Prior to Admission medications   Medication Sig Start Date End Date Taking?  Authorizing Provider  ibuprofen (ADVIL,MOTRIN) 600 MG tablet Take 1 tablet (600 mg total) by mouth 3 (three) times daily. Patient not taking: Reported on 09/30/2015 09/18/13   Sherwood Gambler, MD  ibuprofen (ADVIL,MOTRIN) 800 MG tablet Take 1 tablet (800 mg total) by mouth 3 (three) times daily. Patient not taking: Reported on 09/30/2015 07/13/14   April Palumbo, MD  ondansetron (ZOFRAN ODT) 4 MG disintegrating tablet Take 1 tablet (4 mg total) by mouth every 8 (eight) hours as needed for nausea. Patient not taking: Reported on 06/28/2016 02/18/15   Larene Pickett, PA-C  phenytoin (DILANTIN) 100 MG ER capsule Take 2 capsules (200 mg total) by mouth 2 (two) times daily. 06/28/16   Dorie Rank, MD  topiramate (TOPAMAX) 25 MG tablet Take 50 mg by mouth daily. Take two tablets at night time    Historical Provider, MD  warfarin (COUMADIN) 5 MG tablet Take 1 tablet (5 mg total) by mouth daily. Patient not taking: Reported on 09/30/2015 09/18/13   Sherwood Gambler, MD    Physical Exam:  Constitutional: NAD, calm, comfortable Vitals:   08/20/16 1359 08/20/16 1508 08/20/16 1604 08/20/16 1730  BP: 140/81 122/82 124/82 141/90  Pulse: 68 82 76 95  Resp: 18 16 16 18   Temp:    98.5 F (36.9 C)  TempSrc:    Oral  SpO2: 98% 97% 100% 100%   Eyes: PERRL, lids and conjunctivae normal ENMT: Mucous  membranes are moist. Right tongue laceration from seizure induced self -bite. Posterior pharynx clear of any exudate or lesions. Neck: normal, supple, no masses, no thyromegaly Respiratory: clear to auscultation bilaterally, no wheezing, no crackles. Normal respiratory effort. No accessory muscle use.  Cardiovascular: Regular rate and rhythm, no murmurs / rubs / gallops. No extremity edema. 2+ pedal pulses. No carotid bruits.  Abdomen: no tenderness, no masses palpated. No hepatosplenomegaly. Bowel sounds positive.  Musculoskeletal: no clubbing / cyanosis. No joint deformity upper and lower extremities. Good ROM, no  contractures. Normal muscle tone.  Skin: no rashes, lesions, ulcers. No induration Neurologic: CN 2-12 grossly intact. Sensation intact, DTR normal. Strength 5/5 in all 4.  Psychiatric: Somnolent, but wakes up to answer questions. Falls to sleep back quickly.    Labs on Admission: I have personally reviewed following labs and imaging studies  CBC:  Recent Labs Lab 08/14/16 1158 08/20/16 0910  WBC 5.8 5.5  NEUTROABS 3.2 3.8  HGB 12.2 12.8  HCT 37.1 39.0  MCV 87.5 86.1  PLT 255 [id number]   Basic Metabolic Panel:  Recent Labs Lab 08/14/16 1158 08/20/16 0910  NA 141 139  K 4.0 4.0  CL 107 108  CO2 31 26  GLUCOSE 98 88  BUN 11 14  CREATININE 0.69 0.69  CALCIUM 9.2 9.2   GFR: Estimated Creatinine Clearance: 100.5 mL/min (by C-G formula based on SCr of 0.69 mg/dL). Liver Function Tests: No results for input(s): AST, ALT, ALKPHOS, BILITOT, PROT, ALBUMIN in the last 168 hours. No results for input(s): LIPASE, AMYLASE in the last 168 hours. No results for input(s): AMMONIA in the last 168 hours. Coagulation Profile: No results for input(s): INR, PROTIME in the last 168 hours. Cardiac Enzymes: No results for input(s): CKTOTAL, CKMB, CKMBINDEX, TROPONINI in the last 168 hours. BNP (last 3 results) No results for input(s): PROBNP in the last 8760 hours. HbA1C: No results for input(s): HGBA1C in the last 72 hours. CBG: No results for input(s): GLUCAP in the last 168 hours. Lipid Profile: No results for input(s): CHOL, HDL, LDLCALC, TRIG, CHOLHDL, LDLDIRECT in the last 72 hours. Thyroid Function Tests: No results for input(s): TSH, T4TOTAL, FREET4, T3FREE, THYROIDAB in the last 72 hours. Anemia Panel: No results for input(s): VITAMINB12, FOLATE, FERRITIN, TIBC, IRON, RETICCTPCT in the last 72 hours. Urine analysis:    Component Value Date/Time   COLORURINE YELLOW 02/05/2014 1852   APPEARANCEUR CLEAR 02/05/2014 1852   LABSPEC 1.020 02/05/2014 1852   PHURINE 6.0 02/05/2014  1852   GLUCOSEU NEGATIVE 02/05/2014 1852   HGBUR NEGATIVE 02/05/2014 1852   BILIRUBINUR NEGATIVE 02/05/2014 1852   KETONESUR NEGATIVE 02/05/2014 1852   PROTEINUR NEGATIVE 02/05/2014 1852   UROBILINOGEN 1.0 02/05/2014 1852   NITRITE NEGATIVE 02/05/2014 1852   LEUKOCYTESUR NEGATIVE 02/05/2014 1852    Radiological Exams on Admission: Ct Head Wo Contrast  Result Date: 08/20/2016 CLINICAL DATA:  Multiple seizures today. History of seizures while taking medication. EXAM: CT HEAD WITHOUT CONTRAST TECHNIQUE: Contiguous axial images were obtained from the base of the skull through the vertex without intravenous contrast. COMPARISON:  06/28/2016. FINDINGS: Brain: No evidence of acute infarction, hemorrhage, hydrocephalus, extra-axial collection or mass lesion/mass effect. Vascular: No hyperdense vessel or unexpected calcification. Skull: Normal. Negative for fracture or focal lesion. Sinuses/Orbits: Sphenoid sinus mucosal thickening. Unremarkable orbits. Other: None. IMPRESSION: Chronic sphenoid sinusitis.  Otherwise, normal examination. Electronically Signed   By: Claudie Revering M.D.   On: 08/20/2016 13:51     Assessment/Plan Principal Problem:  Seizure ([place]) Observation/telemetry. Seizure precautions. Supplemental oxygen as needed. Neuro checks every 4 hours. Continue Keppra per neurology.  Active Problems:   Asthma Supplemental oxygen and bronchodilators as needed.    Nausea and vomiting Continue IV hydration. Antiemetics as needed. Trial of clear liquid diet.    Elevated blood pressure Monitor blood pressure. Consider antihypertensive therapy if persistent.    DVT prophylaxis: SCDs. Code Status: Full code. Family Communication:  Disposition Plan: Admit for observation and IV Keppra treatment. Switch to oral in the morning, if no more nausea or emesis. Consults called: Neurology Mike Craze, M.D.) Admission status: Observation/telemetry.   Reubin Milan MD [facility] Pager [phone].  If 7PM-7AM, please contact night-coverage [email or web address] Password [facility]  08/20/2016, 8:04 PM

## 2016-08-20 NOTE — ED Provider Notes (Signed)
[facility] Provider Note   CSN: [id number] Arrival date & time: 08/20/16  [id number]     History   Chief Complaint Chief Complaint  Patient presents with  . Seizures    HPI [name] Tammy [name] is a 50 y.o. female.  HPI Patient comes in with seizure at home this morning.  States she took her Dilantin last night but had not taken her morning doses morning.  She did bite the right side of her tongue.  She is back to baseline when she arrived here.  Denies any complaints of headache.  Denies any other neurological symptoms.  Has had history of seizure activity in the past.  Takes 200 mg of Dilantin in the morning and 200 mg at night. Past Medical History:  Diagnosis Date  . Asthma   . Headache(784.0)   . Seizures [facility])     Patient Active Problem List   Diagnosis Date Noted  . Seizure ([place]) 08/20/2016  . DENTAL PAIN 02/12/2009  . DVT, HX OF 01/20/2008  . FIBROIDS, UTERUS 01/16/2008  . COUGH 01/16/2008  . EMBOLISM/THROMBOSIS, DEEP VSL LWR EXTRM NOS 06/03/2007    Past Surgical History:  Procedure Laterality Date  . HERNIA REPAIR    . UTERINE FIBROID SURGERY      OB History    Gravida Para Term Preterm AB Living   3       1 2    SAB TAB Ectopic Multiple Live Births   1 0             Home Medications    Prior to Admission medications   Medication Sig Start Date End Date Taking? Authorizing Provider  ibuprofen (ADVIL,MOTRIN) 600 MG tablet Take 1 tablet (600 mg total) by mouth 3 (three) times daily. Patient not taking: Reported on 09/30/2015 09/18/13   Sherwood Gambler, MD  ibuprofen (ADVIL,MOTRIN) 800 MG tablet Take 1 tablet (800 mg total) by mouth 3 (three) times daily. Patient not taking: Reported on 09/30/2015 07/13/14   April Palumbo, MD  ondansetron (ZOFRAN ODT) 4 MG disintegrating tablet Take 1 tablet (4 mg total) by mouth every 8 (eight) hours as needed for nausea. Patient not taking: Reported on 06/28/2016 02/18/15   Larene Pickett, PA-C  phenytoin (DILANTIN)  100 MG ER capsule Take 2 capsules (200 mg total) by mouth 2 (two) times daily. 06/28/16   Dorie Rank, MD  topiramate (TOPAMAX) 25 MG tablet Take 50 mg by mouth daily. Take two tablets at night time    Historical Provider, MD  warfarin (COUMADIN) 5 MG tablet Take 1 tablet (5 mg total) by mouth daily. Patient not taking: Reported on 09/30/2015 09/18/13   Sherwood Gambler, MD    Family History No family history on file.  Social History Social History  Substance Use Topics  . Smoking status: Never Smoker  . Smokeless tobacco: Never Used  . Alcohol use No     Allergies   Aspirin   Review of Systems Review of Systems   Physical Exam Updated Vital Signs BP 140/81 (BP Location: Right Arm)   Pulse 68   Temp 97.8 F (36.6 C)   Resp 18   LMP 03/08/2016   SpO2 98%   Physical Exam Physical Exam  Nursing note and vitals reviewed. Constitutional: She is oriented to person, place, and time. She appears well-developed and well-nourished. No distress.  HENT:  Head: Normocephalic and atraumatic. Mouth: Small abrasion noted to the right lateral aspect of tongue.  No active bleeding.  Eyes: Pupils are equal, round, and reactive to light.  Neck: Normal range of motion.  Cardiovascular: Normal rate and intact distal pulses.   Pulmonary/Chest: No respiratory distress.  Abdominal: Normal appearance. She exhibits no distension.  Musculoskeletal: Normal range of motion.  Neurological: She is alert and oriented to person, place, and time. No cranial nerve deficit.  Skin: Skin is warm and dry. No rash noted.  Psychiatric: She has a normal mood and affect. Her behavior is normal.    ED Treatments / Results  Labs (all labs ordered are listed, but only abnormal results are displayed) Labs Reviewed  PHENYTOIN LEVEL, TOTAL - Abnormal; Notable for the following:       Result Value   Phenytoin Lvl 5.6 (*)    All other components within normal limits  CBC WITH DIFFERENTIAL/PLATELET  BASIC  METABOLIC PANEL    EKG  EKG Interpretation None       Radiology Ct Head Wo Contrast  Result Date: 08/20/2016 CLINICAL DATA:  Multiple seizures today. History of seizures while taking medication. EXAM: CT HEAD WITHOUT CONTRAST TECHNIQUE: Contiguous axial images were obtained from the base of the skull through the vertex without intravenous contrast. COMPARISON:  06/28/2016. FINDINGS: Brain: No evidence of acute infarction, hemorrhage, hydrocephalus, extra-axial collection or mass lesion/mass effect. Vascular: No hyperdense vessel or unexpected calcification. Skull: Normal. Negative for fracture or focal lesion. Sinuses/Orbits: Sphenoid sinus mucosal thickening. Unremarkable orbits. Other: None. IMPRESSION: Chronic sphenoid sinusitis.  Otherwise, normal examination. Electronically Signed   By: Claudie Revering M.D.   On: 08/20/2016 13:51    Procedures Procedures (including critical care time)  Medications Ordered in ED Medications  levETIRAcetam (KEPPRA) 500 MG/5ML injection (not administered)  metoprolol (LOPRESSOR) injection 5 mg (not administered)  phenytoin (DILANTIN) ER capsule 200 mg (200 mg Oral Given 08/20/16 0918)  phenytoin (DILANTIN) 125 MG/5ML suspension 25 mg (25 mg Oral Given 08/20/16 1111)  acetaminophen (TYLENOL) tablet 1,000 mg (1,000 mg Oral Given 08/20/16 1111)  levETIRAcetam (KEPPRA) 1,000 mg in sodium chloride 0.9 % 100 mL IVPB (0 mg Intravenous Stopped 08/20/16 1312)     Initial Impression / Assessment and Plan / ED Course  I have reviewed the triage vital signs and the nursing notes.  Pertinent labs & imaging results that were available during my care of the patient were reviewed by me and considered in my medical decision making (see chart for details).  Clinical Course  After the by mouth Dilantin was given patient had vomiting and vomited all her medication.  She then had a second seizure.  This is followed by a short postictal period following this we were going  to give IV Dilantin when she had a third short seizure.  At that point and time she was given 1 g of IV Keppra.  I spoke with the neurologist Dr. Leonel Ramsay, patient will be admitted to the hospitalist service at [facility].    Final Clinical Impressions(s) / ED Diagnoses   Final diagnoses:  Seizure [facility])    New Prescriptions New Prescriptions   No medications on file     Leonard Schwartz, MD 08/20/16 1434

## 2016-08-20 NOTE — ED Triage Notes (Signed)
Patient had a seizure at home in her bed and was found by family. Family concerned about dilantin levels. Patient bit the right side of her tongue. Patient oriented X4.  BP 138/74 on truck, glucose was 109. Her last witnessed in March of 2017

## 2016-08-20 NOTE — ED Notes (Signed)
MD notified of vomiting; pt resting in bed at this time; blankets applied.

## 2016-08-20 NOTE — ED Notes (Signed)
Pt had BM using BSC with assistance

## 2016-08-20 NOTE — ED Notes (Signed)
O2 sats decreased to 82%; pt non-verbal and difficult to arouse. Pt does not respond to verbal stimuli; gazing noted. MD notified; O2 2L [place] placed; Dr. Audie Pinto at bedside; O2 sats increased to 95% on 2L [place].

## 2016-08-20 NOTE — ED Notes (Signed)
Held order for LOPRESSOR - pt BP 140/81.  Dr Audie Pinto advised.

## 2016-08-21 ENCOUNTER — Observation Stay (HOSPITAL_COMMUNITY): Payer: Self-pay

## 2016-08-21 DIAGNOSIS — R569 Unspecified convulsions: Secondary | ICD-10-CM

## 2016-08-21 DIAGNOSIS — G40909 Epilepsy, unspecified, not intractable, without status epilepticus: Secondary | ICD-10-CM

## 2016-08-21 LAB — COMPREHENSIVE METABOLIC PANEL
ALBUMIN: 3.4 g/dL — AB (ref 3.5–5.0)
ALK PHOS: 72 U/L (ref 38–126)
ALT: 10 U/L — ABNORMAL LOW (ref 14–54)
ANION GAP: 7 (ref 5–15)
AST: 16 U/L (ref 15–41)
BILIRUBIN TOTAL: 0.4 mg/dL (ref 0.3–1.2)
BUN: 8 mg/dL (ref 6–20)
CALCIUM: 8.6 mg/dL — AB (ref 8.9–10.3)
CO2: 23 mmol/L (ref 22–32)
Chloride: 108 mmol/L (ref 101–111)
Creatinine, Ser: 0.79 mg/dL (ref 0.44–1.00)
GFR calc Af Amer: >60 mL/min (ref >60)
GFR calc non Af Amer: >60 mL/min (ref >60)
GLUCOSE: 84 mg/dL (ref 65–99)
POTASSIUM: 3.6 mmol/L (ref 3.5–5.1)
SODIUM: 138 mmol/L (ref 135–145)
TOTAL PROTEIN: 6 g/dL — AB (ref 6.5–8.1)

## 2016-08-21 LAB — CBC WITH DIFFERENTIAL/PLATELET
BASOS ABS: 0 10*3/uL (ref 0.0–0.1)
BASOS PCT: 0 %
EOS ABS: 0.1 10*3/uL (ref 0.0–0.7)
Eosinophils Relative: 1 %
HEMATOCRIT: 39.9 % (ref 36.0–46.0)
HEMOGLOBIN: 12.1 g/dL (ref 12.0–15.0)
Lymphocytes Relative: 26 %
Lymphs Abs: 1.7 10*3/uL (ref 0.7–4.0)
MCH: 27.4 pg (ref 26.0–34.0)
MCHC: 30.3 g/dL (ref 30.0–36.0)
MCV: 90.3 fL (ref 78.0–100.0)
Monocytes Absolute: 0.5 10*3/uL (ref 0.1–1.0)
Monocytes Relative: 8 %
NEUTROS ABS: 4.2 10*3/uL (ref 1.7–7.7)
NEUTROS PCT: 65 %
Platelets: 230 10*3/uL (ref 150–400)
RBC: 4.42 MIL/uL (ref 3.87–5.11)
RDW: 13.3 % (ref 11.5–15.5)
WBC: 6.5 10*3/uL (ref 4.0–10.5)

## 2016-08-21 NOTE — Progress Notes (Signed)
Routine EEG completed, results pending. 

## 2016-08-21 NOTE — Progress Notes (Addendum)
Neurology Progress Note  Subjective: No further seizures. She has been very drowsy since starting the [place]. Otherwise she has no specific complaints.  She has ambulated in her room and to the bathroom and reports no difficulty. She is trying a clear liquid diet cautiously due to nausea. EEG was completed this afternoon.   Current Meds:   Current Facility-Administered Medications:  .  acetaminophen (TYLENOL) tablet 650 mg, 650 mg, Oral, Q6H PRN, Reubin Milan, MD .  levETIRAcetam (KEPPRA) 500 mg in sodium chloride 0.9 % 100 mL IVPB, 500 mg, Intravenous, Q12H, Greta Doom, MD, 500 mg at 08/21/16 1142 .  LORazepam (ATIVAN) injection 1 mg, 1 mg, Intravenous, Q4H PRN, Reubin Milan, MD .  metoprolol (LOPRESSOR) injection 5 mg, 5 mg, Intravenous, Once, Leonard Schwartz, MD .  ondansetron [facility]) injection 4 mg, 4 mg, Intravenous, Q6H, Reubin Milan, MD, 4 mg at 08/21/16 1334  Objective:  Temp:  [98 F (36.7 C)-99.3 F (37.4 C)] 98 F (36.7 C) (09/25 1308) Pulse Rate:  [66-95] 74 (09/25 1308) Resp:  [18-20] 20 (09/25 1308) BP: (117-148)/(64-96) 141/91 (09/25 1308) SpO2:  [97 %-100 %] 99 % (09/25 1308)  General: WDWN appears drowsy but otherwise in NAD. Alert, oriented x4. Speech is clear without dysarthria. Affect is bright. Comportment is normal.  HEENT: Neck is supple without lymphadenopathy. Mucous membranes are moist and the oropharynx is clear. Sclerae are anicteric. There is no conjunctival injection.  CV: Regular, no murmur. Carotid pulses are 2+ and symmetric with no bruits. Distal pulses 2+ and symmetric.  Lungs: CTAB  Extremities: No C/C/E. Neuro: MS: As noted above. No aphasia.  CN: Pupils are equal and reactive from 3-->2 mm bilaterally. EOMI, no nystagmus. Facial sensation is intact to light touch. Face is symmetric at rest with normal strength and mobility. Hearing is intact to conversational voice. Voice is normal in tone and quality. Palate elevates  symmetrically. Uvula is midline. Bilateral SCM and trapezii are 5/5. Tongue is midline with normal bulk and mobility.  Motor: Normal bulk, tone, and strength throughout. No pronator drift. No tremor or other abnormal movements are observed.  Sensation: Intact to light touch.  DTRs: 2+, symmetric. Toes are downgoing bilaterally. No pathological reflexes.  Coordination: Finger-to-nose and heel-to-shin are without dysmetria bilaterally.    Labs: Lab Results  Component Value Date   WBC 6.5 08/21/2016   HGB 12.1 08/21/2016   HCT 39.9 08/21/2016   PLT 230 08/21/2016   GLUCOSE 84 08/21/2016   ALT 10 (L) 08/21/2016   AST 16 08/21/2016   NA 138 08/21/2016   K 3.6 08/21/2016   CL 108 08/21/2016   CREATININE 0.79 08/21/2016   BUN 8 08/21/2016   CO2 23 08/21/2016   TSH 1.186 08/14/2016   INR 2.7 12/30/2007   CBC Latest Ref Rng & Units 08/21/2016 08/20/2016 08/14/2016  WBC 4.0 - 10.5 K/uL 6.5 5.5 5.8  Hemoglobin 12.0 - 15.0 g/dL 12.1 12.8 12.2  Hematocrit 36.0 - 46.0 % 39.9 39.0 37.1  Platelets 150 - 400 K/uL 230 199 255    No results found for: HGBA1C Lab Results  Component Value Date   ALT 10 (L) 08/21/2016   AST 16 08/21/2016   ALKPHOS 72 08/21/2016   BILITOT 0.4 08/21/2016    Radiology: No new neuroimaging for review.   Other diagnostic studies:  EEG demonstrated occasional focal slowing in the left temporal region, no epileptiform discharges.   A/P:   1. Epilepsy: She has longstanding epilepsy for  which she has been taking phenytoin for many years but still has frequent breakthrough seizures. EEG showed intermixed focal slowing in the left temporal lobe suggesting regional cortical dysfunction in that area which is somewhat non-specific but which could indicate a source of her seizures, though no overt epileptiform abnormalities were seen.  I would agree that [place] may be a good agent in monotherapy and would recommend a trial. Her phenytoin Has been stopped. Continue Keppra 500  mg BID. This can be titrated up to 1000 mg BID starting tomorrow. She will need to be set up with outpatient neurology follow-up at the time of discharge. Seizure precautions. She was advised that drowsiness is a common side effect of Keppra when it is first started or when the dose is increased; this drowsiness can be magnified when other AEDs (in this case phenytoin) are used concurrently.   This was discussed with the patient and she is in agreement with the plan as noted. She was given the opportunity to ask questions and these were addressed to her satisfaction.   Melba Coon, MD [facility]

## 2016-08-21 NOTE — Procedures (Signed)
ELECTROENCEPHALOGRAM REPORT  Date of Study: 08/21/2016  [name]: Tammy [name] MRN: [id number] Date of Birth: 1966/05/16  Referring Provider: Dr. Roland Rack  Clinical History: This is a 50 year old woman with a history of seizures with breakthrough seizures.  Medications: levETIRAcetam (KEPPRA) 500 mg in sodium chloride 0.9 % 100 mL IVPB  LORazepam (ATIVAN) injection 1 mg  metoprolol (LOPRESSOR) injection 5 mg  ondansetron (ZOFRAN) injection 4 mg   Technical Summary: A multichannel digital EEG recording measured by the international 10-20 system with electrodes applied with paste and impedances below 5000 ohms performed in our laboratory with EKG monitoring in a predominantly drowsy and asleep patient.  Hyperventilation and photic stimulation were not performed.  The digital EEG was referentially recorded, reformatted, and digitally filtered in a variety of bipolar and referential montages for optimal display.    Description: The patient is predominantly drowsy and asleep during the recording.  During brief period of wakefulness, there is a symmetric, medium voltage 8 Hz posterior dominant rhythm that attenuates with eye opening.  There is occasional 2-3 Hz focal delta slowing over the left temporal region. During drowsiness and sleep, there is an increase in theta slowing of the background.  Vertex waves and symmetric sleep spindles were seen.  Hyperventilation and photic stimulation were not performed.  There were no epileptiform discharges or electrographic seizures seen.    EKG lead showed irregular rhythm.  Impression: This predominantly drowsy and asleep EEG is abnormal due to occasional focal slowing over the left temporal  region.  Clinical Correlation of the above findings indicates focal cerebral dysfunction over the left temporal region suggestive of underlying structural or physiologic abnormality. The absence of epileptiform discharges does not exclude a  clinical diagnosis of epilepsy. Clinical correlation is advised.   Ellouise Newer, M.D.

## 2016-08-21 NOTE — Progress Notes (Signed)
PROGRESS NOTE  Tammy [name]  [id number] DOB: [birth year] DOA: 08/20/2016 PCP: Garlan Fillers, MD  Outpatient Specialists: None  Brief Narrative: Tammy [name] is a 50 y.o. female with poorly controlled seizure disorder presenting with seizures.   She states she may have missed a dose of her Dilantin and was found to have subtherapeutic dilantin level of 5.2 on arrival. She was given oral loading of Dilantin, but unfortunately vomited this up. She had multiple recurrent seizures, though she did appear to wake up in between them. She was then given a single dose of IV Keppra and has not had any further seizures since that time.  Assessment & Plan: Principal Problem:   Seizure ([place]) Active Problems:   Asthma   Nausea and vomiting   Elevated blood pressure  Seizure disorder: Poorly controlled with 4 - 5 seizures/month on dilantin found to be subtherapeutic on admission. Had drowsiness when topiramate was added.  - Neurology following - EEG pending - On keppra IV given recent vomiting. Dilantin stopped.  Asthma: Chronic, stable - BD's as needed, supplemental oxygen prn  Nausea and vomiting: - Advancing diet as tolerated. D/C IVF once able.   DVT prophylaxis: SCDs Code Status: Full Family Communication: None at bedside, pt will contact later. Disposition Plan: Still requiring IV keppra, EEG. Will defer to neurology regarding plan for discharge.   Consultants:   Neurology, Dr. Leonel Ramsay  Procedures:   EEG 9/25 pending  Antimicrobials:  None   Subjective: Pt drowsy without complaints. Denies recent seizure-like activity. No focal weakness/numbness or trouble speaking.   Objective: Vitals:   08/21/16 0132 08/21/16 0643 08/21/16 1007 08/21/16 1308  BP: 137/83 (!) 145/93 (!) 148/96 (!) 141/91  Pulse: 71 81 66 74  Resp: 18 18 20 20   Temp: 99.3 F (37.4 C) 98.9 F (37.2 C) 98.1 F (36.7 C) 98 F (36.7 C)  TempSrc: Oral Oral Axillary Oral  SpO2: 97% 99%  97% 99%    Intake/Output Summary (Last 24 hours) at 08/21/16 1425 Last data filed at 08/21/16 0600  Gross per 24 hour  Intake          1544.58 ml  Output                0 ml  Net          1544.58 ml   There were no vitals filed for this visit.  Examination: General exam: 50 y.o. female in no distress Respiratory system: Non-labored breathing. Clear to auscultation bilaterally.  Cardiovascular system: Regular rate and rhythm. No murmur, rub, or gallop. No JVD, and no pedal edema. Gastrointestinal system: Abdomen soft, non-tender, non-distended, with normoactive bowel sounds. No organomegaly or masses felt. Central nervous system: Alert and oriented. No focal neurological deficits. Extremities: Warm, no deformities Skin: No rashes, lesions no ulcers Psychiatry: Judgement and insight appear normal. Mood & affect appropriate.   Data Reviewed: I have personally reviewed following labs and imaging studies  CBC:  Recent Labs Lab 08/20/16 0910 08/21/16 0551  WBC 5.5 6.5  NEUTROABS 3.8 4.2  HGB 12.8 12.1  HCT 39.0 39.9  MCV 86.1 90.3  PLT 199 [id number]   Basic Metabolic Panel:  Recent Labs Lab 08/20/16 0910 08/21/16 0551  NA 139 138  K 4.0 3.6  CL 108 108  CO2 26 23  GLUCOSE 88 84  BUN 14 8  CREATININE 0.69 0.79  CALCIUM 9.2 8.6*   GFR: Estimated Creatinine Clearance: 100.5 mL/min (by C-G formula based on SCr of 0.79  mg/dL). Liver Function Tests:  Recent Labs Lab 08/21/16 0551  AST 16  ALT 10*  ALKPHOS 72  BILITOT 0.4  PROT 6.0*  ALBUMIN 3.4*   No results for input(s): LIPASE, AMYLASE in the last 168 hours. No results for input(s): AMMONIA in the last 168 hours. Coagulation Profile: No results for input(s): INR, PROTIME in the last 168 hours. Cardiac Enzymes: No results for input(s): CKTOTAL, CKMB, CKMBINDEX, TROPONINI in the last 168 hours. BNP (last 3 results) No results for input(s): PROBNP in the last 8760 hours. HbA1C: No results for input(s): HGBA1C  in the last 72 hours. CBG: No results for input(s): GLUCAP in the last 168 hours. Lipid Profile: No results for input(s): CHOL, HDL, LDLCALC, TRIG, CHOLHDL, LDLDIRECT in the last 72 hours. Thyroid Function Tests: No results for input(s): TSH, T4TOTAL, FREET4, T3FREE, THYROIDAB in the last 72 hours. Anemia Panel: No results for input(s): VITAMINB12, FOLATE, FERRITIN, TIBC, IRON, RETICCTPCT in the last 72 hours. Urine analysis:    Component Value Date/Time   COLORURINE YELLOW 02/05/2014 1852   APPEARANCEUR CLEAR 02/05/2014 1852   LABSPEC 1.020 02/05/2014 1852   PHURINE 6.0 02/05/2014 1852   GLUCOSEU NEGATIVE 02/05/2014 1852   HGBUR NEGATIVE 02/05/2014 1852   BILIRUBINUR NEGATIVE 02/05/2014 1852   KETONESUR NEGATIVE 02/05/2014 1852   PROTEINUR NEGATIVE 02/05/2014 1852   UROBILINOGEN 1.0 02/05/2014 1852   NITRITE NEGATIVE 02/05/2014 1852   LEUKOCYTESUR NEGATIVE 02/05/2014 1852   Sepsis Labs: @LABRCNTIP (procalcitonin:4,lacticidven:4)  )No results found for this or any previous visit (from the past 240 hour(s)).   Radiology Studies: Ct Head Wo Contrast  Result Date: 08/20/2016 CLINICAL DATA:  Multiple seizures today. History of seizures while taking medication. EXAM: CT HEAD WITHOUT CONTRAST TECHNIQUE: Contiguous axial images were obtained from the base of the skull through the vertex without intravenous contrast. COMPARISON:  06/28/2016. FINDINGS: Brain: No evidence of acute infarction, hemorrhage, hydrocephalus, extra-axial collection or mass lesion/mass effect. Vascular: No hyperdense vessel or unexpected calcification. Skull: Normal. Negative for fracture or focal lesion. Sinuses/Orbits: Sphenoid sinus mucosal thickening. Unremarkable orbits. Other: None. IMPRESSION: Chronic sphenoid sinusitis.  Otherwise, normal examination. Electronically Signed   By: Claudie Revering M.D.   On: 08/20/2016 13:51    Scheduled Meds: . levETIRAcetam  500 mg Intravenous Q12H  . metoprolol  5 mg  Intravenous Once  . ondansetron (ZOFRAN) IV  4 mg Intravenous Q6H   Continuous Infusions:    LOS: 0 days   Time spent: 25 minutes.  Vance Gather, MD [facility] Pager [phone]  If 7PM-7AM, please contact night-coverage [email or web address] Password [email or web address] 08/21/2016, 2:25 PM

## 2016-08-21 NOTE — Care Management Note (Signed)
Case Management Note  Patient Details  Name: Tammy [name] MRN: [id number] Date of Birth: 07/08/1966  Subjective/Objective:   Pt in with seizures. She is from home. Pt without insurance listed.                Action/Plan: Plan is to return home when medical workup complete. CM following for d/c needs.   Expected Discharge Date:                  Expected Discharge Plan:  Home/Self Care  In-House Referral:     Discharge planning Services     Post Acute Care Choice:    Choice offered to:     DME Arranged:    DME Agency:     HH Arranged:    HH Agency:     Status of Service:  In process, will continue to follow  If discussed at Long Length of Stay Meetings, dates discussed:    Additional Comments:  Pollie Friar, RN 08/21/2016, 10:42 AM

## 2016-08-22 ENCOUNTER — Encounter (HOSPITAL_COMMUNITY): Payer: Self-pay | Admitting: Family Medicine

## 2016-08-22 DIAGNOSIS — G40802 Other epilepsy, not intractable, without status epilepticus: Secondary | ICD-10-CM

## 2016-08-22 MED ORDER — LEVETIRACETAM 500 MG PO TABS
500.0000 mg | ORAL_TABLET | Freq: Two times a day (BID) | ORAL | Status: DC
  Administered 2016-08-22 (×2): 500 mg via ORAL
  Filled 2016-08-22 (×2): qty 1

## 2016-08-22 MED ORDER — LEVETIRACETAM 500 MG PO TABS
ORAL_TABLET | ORAL | 0 refills | Status: DC
Start: 2016-08-22 — End: 2016-09-07

## 2016-08-22 NOTE — Progress Notes (Signed)
Neurology Progress Note  Subjective: No further seizures. She remains drowsy but says this is a little better this morning. She notes less headache since her phenytoin was stopped. She is tolerating clear liquids and hopes to advance her diet today. She is eager to go home. No new complaints this morning.  Current Meds:   Current Facility-Administered Medications:  .  acetaminophen (TYLENOL) tablet 650 mg, 650 mg, Oral, Q6H PRN, Reubin Milan, MD .  levETIRAcetam (KEPPRA) 500 mg in sodium chloride 0.9 % 100 mL IVPB, 500 mg, Intravenous, Q12H, Greta Doom, MD, 500 mg at 08/21/16 2143 .  LORazepam (ATIVAN) injection 1 mg, 1 mg, Intravenous, Q4H PRN, Reubin Milan, MD .  metoprolol [facility]) injection 5 mg, 5 mg, Intravenous, Once, Leonard Schwartz, MD .  ondansetron [facility]) injection 4 mg, 4 mg, Intravenous, Q6H, Reubin Milan, MD, 4 mg at 08/22/16 [id number]  Objective:  Temp:  [97.6 F (36.4 C)-98.6 F (37 C)] 97.8 F (36.6 C) (09/26 0609) Pulse Rate:  [66-74] 66 (09/26 0609) Resp:  [16-20] 16 (09/26 0609) BP: (130-160)/(69-96) 130/78 (09/26 0609) SpO2:  [95 %-100 %] 99 % (09/26 0609)  General: WDWN in NAD. Alert, oriented x4. Speech is clear without dysarthria. Affect is bright. Comportment is normal.  HEENT: Neck is supple without lymphadenopathy. Mucous membranes are moist and the oropharynx is clear. Sclerae are anicteric. There is no conjunctival injection.  CV: Regular, no murmur. Carotid pulses are 2+ and symmetric with no bruits. Distal pulses 2+ and symmetric.  Lungs: CTAB  Extremities: No C/C/E. Neuro: MS: As noted above. No aphasia.  CN: Pupils are equal and reactive from 3-->2 mm bilaterally. EOMI, no nystagmus. Facial sensation is intact to light touch. Face is symmetric at rest with normal strength and mobility. Hearing is intact to conversational voice. Voice is normal in tone and quality. Palate elevates symmetrically. Uvula is midline. Bilateral SCM  and trapezii are 5/5. Tongue is midline with normal bulk and mobility.  Motor: Normal bulk, tone, and strength throughout. No pronator drift. No tremor or other abnormal movements are observed.  Sensation: Intact to light touch.  DTRs: 2+, symmetric. Toes are downgoing bilaterally. No pathological reflexes.  Coordination: Finger-to-nose and heel-to-shin are without dysmetria bilaterally.    Labs: Lab Results  Component Value Date   WBC 6.5 08/21/2016   HGB 12.1 08/21/2016   HCT 39.9 08/21/2016   PLT 230 08/21/2016   GLUCOSE 84 08/21/2016   ALT 10 (L) 08/21/2016   AST 16 08/21/2016   NA 138 08/21/2016   K 3.6 08/21/2016   CL 108 08/21/2016   CREATININE 0.79 08/21/2016   BUN 8 08/21/2016   CO2 23 08/21/2016   TSH 1.186 08/14/2016   INR 2.7 12/30/2007   CBC Latest Ref Rng & Units 08/21/2016 08/20/2016 08/14/2016  WBC 4.0 - 10.5 K/uL 6.5 5.5 5.8  Hemoglobin 12.0 - 15.0 g/dL 12.1 12.8 12.2  Hematocrit 36.0 - 46.0 % 39.9 39.0 37.1  Platelets 150 - 400 K/uL 230 199 255    No results found for: HGBA1C Lab Results  Component Value Date   ALT 10 (L) 08/21/2016   AST 16 08/21/2016   ALKPHOS 72 08/21/2016   BILITOT 0.4 08/21/2016    Radiology: No new neuroimaging for review.   A/P:   1. Epilepsy: She has longstanding epilepsy with 4-5 breakthrough seizures per month on phenytoin. EEG showed intermixed focal slowing in the left temporal lobe suggesting regional cortical dysfunction in that area which is  somewhat non-specific but which could indicate a source of her seizures, though no overt epileptiform abnormalities were seen. Hopefully Keppra will provide improved seizure control as a monotherapeutic agent. Continue Keppra 500 mg BID. Given her significant drowsiness, I would slow the titration for her. I would recommend a more gradual increase in her dose with titration every seven days: starting 10/1, increase to 500 mg qam and 1000 mg qhs then increasing to goal dose 1000 mg bid on  10/8 as tolerated. She will need to be set up with outpatient neurology follow-up at the time of discharge. She was advised to keep a seizure diary to allow her providers to monitor the efficacy of her Keppra. Seizure precautions.   This was discussed with the patient and she is in agreement with the plan as noted. She was given the opportunity to ask questions and these were addressed to her satisfaction.  She is fine for discharge from the neuro perspective at this point.    Melba Coon, MD [facility]

## 2016-08-22 NOTE — Care Management Note (Signed)
Case Management Note  Patient Details  Name: Tammy [name] MRN: [id number] Date of Birth: 08/15/1966  Subjective/Objective:                    Action/Plan: Pt discharging home with self care. Pt scheduled for f/u at [facility] on Friday. Pt starting on new medication: Keppra. Pt discharging too late to make it to the [facility] pharmacy tonight. RN to giver her keppra tonight and then she will fill her prescription at the [facility] pharmacy tomorrow am. CM unable to provide her with a [place] letter since she received one in May of 2017 and pt states she can not afford the [place] with the coupon from [facility].   Expected Discharge Date:                  Expected Discharge Plan:  Home/Self Care  In-House Referral:     Discharge planning Services     Post Acute Care Choice:    Choice offered to:     DME Arranged:    DME Agency:     HH Arranged:    [place] Agency:     Status of Service:  In process, will continue to follow  If discussed at Long Length of Stay Meetings, dates discussed:    Additional Comments:  Pollie Friar, RN 08/22/2016, 4:56 PM

## 2016-08-22 NOTE — Discharge Summary (Signed)
Physician Discharge Summary  Tammy [name] [id number] DOB: [birth year] DOA: 08/20/2016  PCP: Tammy Fillers, MD  Admit date: 08/20/2016 Discharge date: 08/22/2016  Admitted From: Home Disposition: Home   Recommendations for Outpatient Follow-up:  1. Follow up with PCP on 9/29, verify tolerance of keppra titration as below:  1. Continue Keppra 500 mg BID with titration every seven days: starting 10/1, increase to 500 mg qam and 1000 mg qhs then increasing to goal dose 1000 mg bid on 10/8 as tolerated.   Home Health: None Equipment/Devices: None  Discharge Condition: Stable CODE STATUS: Full Diet recommendation: Heart healthy  Brief/Interim Summary: Tammy [place] C Godfreyis a 50 y.o. female with poorly controlled seizure disorder presenting with seizures.   She states she may have missed a dose of her Dilantin and was found to have subtherapeutic dilantin level of 5.2 on arrival. She was given oral loading of Dilantin, but unfortunately vomited this up. She had multiple recurrent seizures, though she did appear to wake up in between them. She was then given a single dose of IV Keppra and has not had any further seizures since that time. Titration of keppra has caused drowsiness, so a gradual dose increase is recommended as above from the current schedule (500mg  BID) to 1000mg  BID over 2 weeks.  She was also experiencing nausea and vomiting at admission, thought to be due to viral gastroenteritis. This subsided spontaneously following supportive care with zofran and IV fluids.   Discharge Diagnoses:  Principal Problem:   Seizure [facility]) Active Problems:   Asthma   Nausea and vomiting   Elevated blood pressure   Seizures [facility])  Discharge Instructions Discharge Instructions    Ambulatory referral to Neurology    Complete by:  As directed    An appointment is requested in approximately 4 weeks for management of seizure disorder.   Diet - low sodium heart healthy    Complete by:  As  directed    Discharge instructions    Complete by:  As directed    You were admitted for uncontrolled seizures. The new treatment involves STOPPING DILANTIN and STARTING KEPPRA as follows:  Start taking 500mg  twice a day Starting 10/1, increase to 500 mg in the morning and 1000 mg in the evening  Starting 10/8, increase to the goal dose of 1000 mg twice daily.   This may make you somewhat drowsy but it is important that you are able to continue taking this. It is very important that you follow up with [facility] and Neurology as an outpatient.   Increase activity slowly    Complete by:  As directed        Medication List    STOP taking these medications   ibuprofen 600 MG tablet Commonly known as:  ADVIL,MOTRIN   ibuprofen 800 MG tablet Commonly known as:  ADVIL,MOTRIN   ondansetron 4 MG disintegrating tablet Commonly known as:  ZOFRAN ODT   phenytoin 100 MG ER capsule Commonly known as:  DILANTIN   warfarin 5 MG tablet Commonly known as:  COUMADIN     TAKE these medications   acetaminophen 325 MG tablet Commonly known as:  TYLENOL Take 325 mg by mouth every 6 (six) hours as needed for moderate pain or headache.   levETIRAcetam 500 MG tablet Commonly known as:  KEPPRA Take 500mg  twice daily until 10/1, then increase to 500 mg qam and 1000 mg qhs then increase to 1000 mg bid on 10/8 as tolerated   SUMAtriptan 50  MG tablet Commonly known as:  IMITREX Take 50 mg by mouth every 2 (two) hours as needed for migraine (at the onset of migraine). May repeat in 2 hours if headache persists or recurs.      Follow-up [facility] up on 08/25/2016.   Why:  Your appointment time is 2pm. Please arrive 15 min early. Please bring a picture ID and your current medications.  Contact information: [place] [phone] [place]. Schedule an  appointment as soon as possible for a visit in 1 month(s).   Contact information: [address]     [address] [place] [phone]         Allergies  Allergen Reactions  . Aspirin Other (See Comments)    Patient unsure of reaction    Consultations:  Neurohospitalists, Dr. Shon Hale  Procedures/Studies: Ct Head Wo Contrast  Result Date: 08/20/2016 CLINICAL DATA:  Multiple seizures today. History of seizures while taking medication. EXAM: CT HEAD WITHOUT CONTRAST TECHNIQUE: Contiguous axial images were obtained from the base of the skull through the vertex without intravenous contrast. COMPARISON:  06/28/2016. FINDINGS: Brain: No evidence of acute infarction, hemorrhage, hydrocephalus, extra-axial collection or mass lesion/mass effect. Vascular: No hyperdense vessel or unexpected calcification. Skull: Normal. Negative for fracture or focal lesion. Sinuses/Orbits: Sphenoid sinus mucosal thickening. Unremarkable orbits. Other: None. IMPRESSION: Chronic sphenoid sinusitis.  Otherwise, normal examination. Electronically Signed   By: Claudie Revering M.D.   On: 08/20/2016 13:51   US Venous Img Lower Unilateral Left  Result Date: 08/14/2016 CLINICAL DATA:  Left lower leg pain and swelling for 1 day. EXAM: LEFT LOWER EXTREMITY VENOUS DOPPLER ULTRASOUND TECHNIQUE: Gray-scale sonography with graded compression, as well as color Doppler and duplex ultrasound, were performed to evaluate the deep venous system from the level of the common femoral vein through the popliteal and proximal calf veins. Spectral Doppler was utilized to evaluate flow at rest and with distal augmentation maneuvers. COMPARISON:  09/18/2013 FINDINGS: Right common femoral vein is patent without thrombosis. Normal compressibility, augmentation and color Doppler flow in the left common femoral vein, left femoral vein and left popliteal vein. The left saphenofemoral junction is patent. Left profunda femoral  vein is patent without thrombus. Visualized left deep calf veins are patent without thrombus. Visualized left great saphenous vein is patent. Subcutaneous edema in the left ankle. There is a hypoechoic collection in the left posterior fossa. There is no flow within this structure. This popliteal fossa collection measures 7.0 x 1.7 x 4.0 cm. IMPRESSION: Negative for deep venous thrombosis in left lower extremity. Hypoechoic collection in the left popliteal fossa. Findings are compatible with a Baker's cyst. Electronically Signed   By: Markus Daft M.D.   On: 08/14/2016 11:38     Subjective: Patient ready to go home. Ate breakfast and lunch without nausea. No focal deficits or seizure-like activity.   Discharge Exam: Vitals:   08/22/16 1000 08/22/16 1359  BP: 138/75 (!) 160/97  Pulse: 73 68  Resp: 17 16  Temp: 98.4 F (36.9 C) 98 F (36.7 C)   Vitals:   08/22/16 0154 08/22/16 0609 08/22/16 1000 08/22/16 1359  BP: 138/80 130/78 138/75 (!) 160/97  Pulse: 70 66 73 68  Resp: 16 16 17 16   Temp: 97.7 F (36.5 C) 97.8 F (36.6 C) 98.4 F (36.9 C) 98 F (36.7 C)  TempSrc: Oral Oral  Oral Oral  SpO2: 100% 99% 98% 98%   General: Pt is alert, awake, not in acute distress Cardiovascular: RRR, S1/S2 +, no rubs, no gallops Respiratory: CTA bilaterally, no wheezing, no rhonchi Abdominal: Soft, NT, ND, bowel sounds + Extremities: no edema, no cyanosis  The results of significant diagnostics from this hospitalization (including imaging, microbiology, ancillary and laboratory) are listed below for reference.    Microbiology: No results found for this or any previous visit (from the past 240 hour(s)).   Labs: BNP (last 3 results) No results for input(s): BNP in the last 8760 hours. Basic Metabolic Panel:  Recent Labs Lab 08/20/16 0910 08/21/16 0551  NA 139 138  K 4.0 3.6  CL 108 108  CO2 26 23  GLUCOSE 88 84  BUN 14 8  CREATININE 0.69 0.79  CALCIUM 9.2 8.6*   Liver Function  Tests:  Recent Labs Lab 08/21/16 0551  AST 16  ALT 10*  ALKPHOS 72  BILITOT 0.4  PROT 6.0*  ALBUMIN 3.4*   No results for input(s): LIPASE, AMYLASE in the last 168 hours. No results for input(s): AMMONIA in the last 168 hours. CBC:  Recent Labs Lab 08/20/16 0910 08/21/16 0551  WBC 5.5 6.5  NEUTROABS 3.8 4.2  HGB 12.8 12.1  HCT 39.0 39.9  MCV 86.1 90.3  PLT 199 230   Cardiac Enzymes: No results for input(s): CKTOTAL, CKMB, CKMBINDEX, TROPONINI in the last 168 hours. BNP: Invalid input(s): POCBNP CBG: No results for input(s): GLUCAP in the last 168 hours. D-Dimer No results for input(s): DDIMER in the last 72 hours. Hgb A1c No results for input(s): HGBA1C in the last 72 hours. Lipid Profile No results for input(s): CHOL, HDL, LDLCALC, TRIG, CHOLHDL, LDLDIRECT in the last 72 hours. Thyroid function studies No results for input(s): TSH, T4TOTAL, T3FREE, THYROIDAB in the last 72 hours.  Invalid input(s): FREET3 Anemia work up No results for input(s): VITAMINB12, FOLATE, FERRITIN, TIBC, IRON, RETICCTPCT in the last 72 hours. Urinalysis    Component Value Date/Time   COLORURINE YELLOW 02/05/2014 1852   APPEARANCEUR CLEAR 02/05/2014 1852   LABSPEC 1.020 02/05/2014 1852   PHURINE 6.0 02/05/2014 1852   GLUCOSEU NEGATIVE 02/05/2014 1852   HGBUR NEGATIVE 02/05/2014 1852   BILIRUBINUR NEGATIVE 02/05/2014 1852   KETONESUR NEGATIVE 02/05/2014 1852   PROTEINUR NEGATIVE 02/05/2014 1852   UROBILINOGEN 1.0 02/05/2014 1852   NITRITE NEGATIVE 02/05/2014 1852   LEUKOCYTESUR NEGATIVE 02/05/2014 1852   Sepsis Labs Invalid input(s): PROCALCITONIN,  WBC,  LACTICIDVEN Microbiology No results found for this or any previous visit (from the past 240 hour(s)).  Time coordinating discharge: Over 30 minutes  Vance Gather, MD  [facility] 08/22/2016, 4:16 PM Pager [phone]  If 7PM-7AM, please contact night-coverage [email or web address] Password [email or web address]

## 2016-08-22 NOTE — Care Management Note (Signed)
Case Management Note  Patient Details  Name: Tammy [name] MRN: [id number] Date of Birth: [birth year]  Subjective/Objective:                    Action/Plan: CM met with the patient yesterday about no insurance and no PCP. Pt states she had been referred to [facility] but was suppose to call Monday for an appointment. CM called [place] and was able to obtain her an appointment for Friday at 2 pm. Information given to the patient and placed on the AVS.   Expected Discharge Date:                  Expected Discharge Plan:  Home/Self Care  In-House Referral:     Discharge planning Services     Post Acute Care Choice:    Choice offered to:     DME Arranged:    DME Agency:     HH Arranged:    [place] Agency:     Status of Service:  In process, will continue to follow  If discussed at Long Length of Stay Meetings, dates discussed:    Additional Comments:  Pollie Friar, RN 08/22/2016, 3:13 PM

## 2016-08-23 MED FILL — ?LEVETIRACETAM 500 MG TAB: 500 MG | 30 days supply | Qty: 120 | Fill #0

## 2016-08-25 ENCOUNTER — Encounter: Payer: Self-pay | Admitting: Physician Assistant

## 2016-08-25 ENCOUNTER — Ambulatory Visit: Payer: Self-pay | Attending: Internal Medicine | Admitting: Physician Assistant

## 2016-08-25 VITALS — BP 135/81 | HR 52 | Temp 98.3°F | Resp 16 | Wt 215.8 lb

## 2016-08-25 DIAGNOSIS — R569 Unspecified convulsions: Secondary | ICD-10-CM

## 2016-08-25 DIAGNOSIS — M7122 Synovial cyst of popliteal space [Baker], left knee: Secondary | ICD-10-CM

## 2016-08-25 NOTE — Progress Notes (Signed)
Pt is in the office today for follow up on seizures

## 2016-08-25 NOTE — Progress Notes (Signed)
Tammy [name], is a 50 y.o. female  [id number]  [id number]  DOB - August 10, 1966  Subjective:  Chief Complaint and HPI: Tammy [name] is a 50 y.o. female here today to establish care and for a follow up visit after an ED visit for L leg pain and swelling on 08/14/2016 and hospitalization 08/20/2016-08/22/2016 for seizure activity. On 08/14/2016 she presented with 1 day hx of gradual onset, constant, mild left leg swelling.  TSH,  BMP, CBC, venous doppler were negative.  No DVT.  +Baker's cyst on her L knee.  The pain from this has resolved.    On 08/20/2016, she was taken to the ED via EMS who was called by her family after she had a seizure.  She has long-standing seizure disorder and has previously/recently been on Dilantin with subtherapeutic levels when labs were drawn in August and again in September.  During the hospitalization, Dilantin was stopped and Keppra was started.  She was also thought to have concomitant viral gastroenteritis which resolved during hospitalization. Labs overall unremarkable.  She was stabilized and discharged to home on 08/22/2106.  She has felt good since discharge.  She has no new complaints today.  She is off work this week and would like to try and go back half days next week then try full days the week after that.  She has a f/up appt with [facility] on 09/07/2016.  She is tolerating the Keppra titration without any problems.    ED/Hospital notes reviewed.     ROS:   Constitutional:  No f/c, No night sweats, No unexplained weight loss. EENT:  No vision changes, No blurry vision, No hearing changes. No mouth, throat, or ear problems.  Respiratory: No cough, No SOB Cardiac: No CP, no palpitations GI:  No abd pain, No N/V/D. GU: No Urinary s/sx Musculoskeletal: No joint pain Neuro: No headache, no dizziness, no motor weakness.  Skin: No rash Endocrine:  No polydipsia. No polyuria.  Psych: Denies SI/HI  No problems  updated.  ALLERGIES: Allergies  Allergen Reactions  . Aspirin Other (See Comments)    Patient unsure of reaction     PAST MEDICAL HISTORY: Past Medical History:  Diagnosis Date  . Asthma   . Headache(784.0)   . Seizures ([place])     MEDICATIONS AT HOME: Prior to Admission medications   Medication Sig Start Date End Date Taking? Authorizing Provider  acetaminophen (TYLENOL) 325 MG tablet Take 325 mg by mouth every 6 (six) hours as needed for moderate pain or headache.    Historical Provider, MD  levETIRAcetam (KEPPRA) 500 MG tablet Take 500mg  twice daily until 10/1, then increase to 500 mg qam and 1000 mg qhs then increase to 1000 mg bid on 10/8 as tolerated 08/22/16   Patrecia Pour, MD  SUMAtriptan (IMITREX) 50 MG tablet Take 50 mg by mouth every 2 (two) hours as needed for migraine (at the onset of migraine). May repeat in 2 hours if headache persists or recurs.    Historical Provider, MD     Objective:  EXAM:   Vitals:   08/25/16 1446  BP: 135/81  Pulse: (!) 52  Resp: 16  Temp: 98.3 F (36.8 C)  TempSrc: Oral  SpO2: 97%  Weight: 215 lb 12.8 oz (97.9 kg)    General appearance : A&OX3. NAD. Non-toxic-appearing HEENT: Atraumatic and Normocephalic.  PERRLA. EOM intact.  TM clear B. Mouth-MMM, post pharynx WNL w/o erythema, No PND. Neck: supple, no JVD. No cervical lymphadenopathy. No thyromegaly Chest/Lungs:  Breathing-non-labored, Good air entry bilaterally, breath sounds normal without rales, rhonchi, or wheezing  CVS: S1 S2 regular, no murmurs, gallops, rubs  Extremities: Bilateral Lower Ext shows no edema, both legs are warm to touch with = pulse throughout Neurology:  CN II-XII grossly intact, Non focal.   Psych:  TP linear. J/I WNL. Normal speech. Appropriate eye contact and affect.  Skin:  No Rash  Data Review No results found for: HGBA1C   Assessment & Plan   1. Seizure ([place]) Continue with Keppra titration and keep neuro f/up appt.  2. Baker's cyst of  knee, left Pain has resolved at this time  Patient have been counseled extensively about nutrition and exercise  Return in about 6 weeks (around 10/06/2016) for establish with PCP/CPE.  The patient was given clear instructions to go to ER or return to medical center if symptoms don't improve, worsen or new problems develop. The patient verbalized understanding. The patient was told to call to get lab results if they haven't Tammy anything in the next week.     Freeman Caldron, PA-C [facility] and [place], [place]   08/25/2016, 3:53 PMPatient ID: Tammy [name], female   DOB: [birth year], 50 y.o.   MRN: [id number]

## 2016-08-25 NOTE — Patient Instructions (Signed)
Keep appointment with Neurologist

## 2016-09-07 ENCOUNTER — Telehealth: Payer: Self-pay | Admitting: Neurology

## 2016-09-07 ENCOUNTER — Encounter: Payer: Self-pay | Admitting: Neurology

## 2016-09-07 ENCOUNTER — Ambulatory Visit (INDEPENDENT_AMBULATORY_CARE_PROVIDER_SITE_OTHER): Payer: Self-pay | Admitting: Neurology

## 2016-09-07 VITALS — BP 152/100 | HR 70 | Resp 18 | Ht 69.0 in | Wt 214.0 lb

## 2016-09-07 DIAGNOSIS — R569 Unspecified convulsions: Secondary | ICD-10-CM

## 2016-09-07 DIAGNOSIS — G43709 Chronic migraine without aura, not intractable, without status migrainosus: Secondary | ICD-10-CM

## 2016-09-07 DIAGNOSIS — IMO0002 Reserved for concepts with insufficient information to code with codable children: Secondary | ICD-10-CM

## 2016-09-07 MED ORDER — DIVALPROEX SODIUM ER 500 MG PO TB24: 1000.0000 mg | ORAL_TABLET | Freq: Every day | ORAL | 11 refills | Status: DC

## 2016-09-07 MED ORDER — SUMATRIPTAN SUCCINATE 50 MG PO TABS: 50.0000 mg | ORAL_TABLET | ORAL | 6 refills | Status: DC | PRN

## 2016-09-07 MED FILL — SUMATRIPTAN SUCC 50 MG TAB: 50 | 20 days supply | Qty: 9 | Fill #0

## 2016-09-07 MED FILL — ?DIVALPROEX SOD ER 500 MG T: 500 MG | 30 days supply | Qty: 60 | Fill #0

## 2016-09-07 NOTE — Telephone Encounter (Signed)
Pt called to advise all RX's need to go to [facility] (p) [phone]. She was not aware of this at the time of her appt today so the RX's from today will need to be faxed there

## 2016-09-07 NOTE — Telephone Encounter (Signed)
Patient called 9:36:58 to advise, she missed the 9:20 bus, she will catch the next bus but may be a few minutes late for 11:00 appointment (arrive 10:30) w/Dr. Krista Blue, patient advised will still be seen up to 10 minutes past appointment time, after that it will be up to the doctor.

## 2016-09-07 NOTE — Progress Notes (Signed)
PATIENT: Tammy [name] DOB: 1966-07-23  Chief Complaint  Patient presents with  . Seizure Disorder    [name] is here for eval of sz. d/o.  Sts. she was dx. around 2010.  Sts. she was sleeping.  Her 49 y/o dtr. heard her "gurgling" and noted her eyes rolling back.  She was seen at [facility] ER and started on Keppra.  She took it inconsistenly due to cost, and  so still had occasional sz. She also sts. she believes Dilantin caused her throat to swell.  She was recently hospitalized at [facility] for sz. and at that time was switched from Dilantin to [place].  Sts. she is tolerating Keppra well, but it causes   . Headaches    excessive drowsiness.  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  Sts. 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  Sts. she took meds in high school, which her mother told her were for migraines.  She now wonders if the med she was taking was for sz./fim     HISTORICAL  [name] Tammy [name] is a 50 years old right-handed female, seen in refer by emergency room for evaluation of seizure, frequent headaches, initial evaluation was September 07 2016  She presented to emergency room on August 22 2016 following recurrent seizure, Dilantin level was low 5.6, I personally reviewed CAT scan of the brain without contrast, no significant abnormality noticed.  She reported long-standing history of seizure since childhood, she was 6 months premature, her twin sister died, she only weight 2 Lb, she described frequent passing out episode growing up.  She was previously seen by her primary care physician Tammy [name], was treated with Dilantin around 2010, but she has poor social economic status, now lives with her daughter, not compliant with her medications, also lost follow-ups with her primary care since 2016.  She reported 2-3 seizure-like spells every month, preceded by seeing flashing  light, sometimes bell ringing then passed out, she denies tongue biting, urinary incontinence.  She also reported lifelong history of migraine headaches, she has daily mild pressure headaches, 3 times a week it would exacerbated to a more severe pounding headache with associated light noise sensitivity, nauseous, helped by sleep,  EEG in September 2016 showed left temporal focal slowing, she was put on Keppra 500 mg twice a day, tolerating it well, Imitrex 50 mg as needed works well.   Labs normal CMP creat 0.79, calcium 8.6, ALT 10, dilantin 5.6, normal TSH, normal CBC  REVIEW OF SYSTEMS: Full 14 system review of systems performed and notable only for as above  ALLERGIES: Allergies  Allergen Reactions  . Dilantin [Phenytoin] Swelling  . Aspirin Other (See Comments)    Patient unsure of reaction     HOME MEDICATIONS: Current Outpatient Prescriptions  Medication Sig Dispense Refill  . acetaminophen (TYLENOL) 325 MG tablet Take 325 mg by mouth every 6 (six) hours as needed for moderate pain or headache.    . levETIRAcetam (KEPPRA) 500 MG tablet Take 500mg  twice daily until 10/1, then increase to 500 mg qam and 1000 mg qhs then increase to 1000 mg bid on 10/8 as tolerated 120 tablet 0  . SUMAtriptan (IMITREX) 50 MG tablet Take 50 mg by mouth every 2 (two) hours as needed for migraine (at the onset of migraine). May repeat in 2 hours if headache persists or recurs.  No current facility-administered medications for this visit.     PAST MEDICAL HISTORY: Past Medical History:  Diagnosis Date  . Asthma   . Headache(784.0)   . Seizures ([place])     PAST SURGICAL HISTORY: Past Surgical History:  Procedure Laterality Date  . HERNIA REPAIR    . UTERINE FIBROID SURGERY      FAMILY HISTORY: Family History  Problem Relation Age of Onset  . Heart disease Mother   . Migraines Mother   . Heart attack Father     SOCIAL HISTORY:  Social History   Social History  . Marital status:  Single    Spouse name: N/A  . Number of children: 2  . Years of education: N/A   Occupational History  . Warehouse,    Social History Main Topics  . Smoking status: Never Smoker  . Smokeless tobacco: Never Used  . Alcohol use No  . Drug use:     Types: Marijuana  . Sexual activity: Not on file   Other Topics Concern  . Not on file   Social History Narrative  . No narrative on file     PHYSICAL EXAM   Vitals:   09/07/16 1130  BP: (!) 152/100  Pulse: 70  Resp: 18  Weight: 214 lb (97.1 kg)  Height: 5\' 9"  (1.753 m)    Not recorded      Body mass index is 31.6 kg/m.  PHYSICAL EXAMNIATION:  Gen: NAD, conversant, well nourised, obese, well groomed                     Cardiovascular: Regular rate rhythm, no peripheral edema, warm, nontender. Eyes: Conjunctivae clear without exudates or hemorrhage Neck: Supple, no carotid bruise. Pulmonary: Clear to auscultation bilaterally   NEUROLOGICAL EXAM:  MENTAL STATUS: Speech:    Speech is normal; fluent and spontaneous with normal comprehension.  Cognition:     Orientation to time, place and person     Normal recent and remote memory     Normal Attention span and concentration     Normal Language, naming, repeating,spontaneous speech     Fund of knowledge   CRANIAL NERVES: CN II: Visual fields are full to confrontation. Fundoscopic exam is normal with sharp discs and no vascular changes. Pupils are round equal and briskly reactive to light. CN III, IV, VI: extraocular movement are normal. No ptosis. CN V: Facial sensation is intact to pinprick in all 3 divisions bilaterally. Corneal responses are intact.  CN VII: Face is symmetric with normal eye closure and smile. CN VIII: Hearing is normal to rubbing fingers CN IX, X: Palate elevates symmetrically. Phonation is normal. CN XI: Head turning and shoulder shrug are intact CN XII: Tongue is midline with normal movements and no atrophy.  MOTOR: There is no pronator  drift of out-stretched arms. Muscle bulk and tone are normal. Muscle strength is normal.  REFLEXES: Reflexes are 2+ and symmetric at the biceps, triceps, knees, and ankles. Plantar responses are flexor.  SENSORY: Intact to light touch, pinprick, positional sensation and vibratory sensation are intact in fingers and toes.  COORDINATION: Rapid alternating movements and fine finger movements are intact. There is no dysmetria on finger-to-nose and heel-knee-shin.    GAIT/STANCE: Posture is normal. Gait is steady with normal steps, base, arm swing, and turning. Heel and toe walking are normal. Tandem gait is normal.  Romberg is absent.   DIAGNOSTIC DATA (LABS, IMAGING, TESTING) - I reviewed patient records, labs, notes, testing  and imaging myself where available.   ASSESSMENT AND PLAN  [name] Tammy Shave is a 50 y.o. 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 Ph.D.  [facility] [address] [place], [place] [address] Ph: [phone] Fax: [phone]  CC: Garlan Fillers, MD

## 2016-09-07 NOTE — Telephone Encounter (Signed)
Pharmacy changed and new Rxs submitted.

## 2016-09-13 ENCOUNTER — Emergency Department (HOSPITAL_COMMUNITY)
Admission: EM | Admit: 2016-09-13 | Discharge: 2016-09-13 | Disposition: A | Discharge status: Home / Self Care | Payer: No Typology Code available for payment source | Attending: Emergency Medicine | Admitting: Emergency Medicine

## 2016-09-13 ENCOUNTER — Encounter (HOSPITAL_COMMUNITY): Payer: Self-pay

## 2016-09-13 DIAGNOSIS — R519 Headache, unspecified: Secondary | ICD-10-CM

## 2016-09-13 DIAGNOSIS — Z79899 Other long term (current) drug therapy: Secondary | ICD-10-CM | POA: Insufficient documentation

## 2016-09-13 DIAGNOSIS — J45909 Unspecified asthma, uncomplicated: Secondary | ICD-10-CM | POA: Insufficient documentation

## 2016-09-13 DIAGNOSIS — R51 Headache: Secondary | ICD-10-CM | POA: Insufficient documentation

## 2016-09-13 LAB — BASIC METABOLIC PANEL
Anion gap: 10 (ref 5–15)
BUN: 6 mg/dL (ref 6–20)
CALCIUM: 9.8 mg/dL (ref 8.9–10.3)
CO2: 25 mmol/L (ref 22–32)
CREATININE: 0.78 mg/dL (ref 0.44–1.00)
Chloride: 103 mmol/L (ref 101–111)
Glucose, Bld: 87 mg/dL (ref 65–99)
Potassium: 3.6 mmol/L (ref 3.5–5.1)
SODIUM: 138 mmol/L (ref 135–145)

## 2016-09-13 LAB — CBC
HCT: 40 % (ref 36.0–46.0)
Hemoglobin: 12.8 g/dL (ref 12.0–15.0)
MCH: 27.7 pg (ref 26.0–34.0)
MCHC: 32 g/dL (ref 30.0–36.0)
MCV: 86.6 fL (ref 78.0–100.0)
Platelets: 260 10*3/uL (ref 150–400)
RBC: 4.62 MIL/uL (ref 3.87–5.11)
RDW: 13.1 % (ref 11.5–15.5)
WBC: 11.9 10*3/uL — ABNORMAL HIGH (ref 4.0–10.5)

## 2016-09-13 LAB — CBG MONITORING, ED: GLUCOSE-CAPILLARY: 91 mg/dL (ref 65–99)

## 2016-09-13 LAB — VALPROIC ACID LEVEL: VALPROIC ACID LVL: 53 ug/mL (ref 50.0–100.0)

## 2016-09-13 MED ORDER — METOCLOPRAMIDE HCL 5 MG/ML IJ SOLN
10.0000 mg | Freq: Once | INTRAMUSCULAR | Status: AC
  Administered 2016-09-13: 10 mg via INTRAVENOUS
  Filled 2016-09-13: qty 2

## 2016-09-13 MED ORDER — SODIUM CHLORIDE 0.9 % IV BOLUS (SEPSIS)
1000.0000 mL | Freq: Once | INTRAVENOUS | Status: AC
  Administered 2016-09-13: 1000 mL via INTRAVENOUS

## 2016-09-13 MED ORDER — DIPHENHYDRAMINE HCL 50 MG/ML IJ SOLN
25.0000 mg | Freq: Once | INTRAMUSCULAR | Status: AC
  Administered 2016-09-13: 25 mg via INTRAVENOUS
  Filled 2016-09-13: qty 1

## 2016-09-13 NOTE — Discharge Instructions (Signed)
Continue your regular home medications. Follow-up with Dr. Krista Blue. Return here for new concerns.

## 2016-09-13 NOTE — ED Triage Notes (Signed)
Patient complains of possible seizure this am, no tongue or mouth trauma noted, hx of same. States that she was alone but now complains of headache, hx of migraines as well. On arrival alert and oriented.

## 2016-09-13 NOTE — ED Notes (Signed)
CBG 91 

## 2016-09-13 NOTE — ED Notes (Signed)
Unable to get labs off IV x 2. Phlebotomy made aware, will come try to get labs.

## 2016-09-13 NOTE — ED Provider Notes (Signed)
[facility] DEPT Provider Note   CSN: [id number] Arrival date & time: 09/13/16  1137     History   Chief Complaint Chief Complaint  Patient presents with  . Seizures    HPI [name] C Pretti is a 50 y.o. female.  The history is provided by the patient and medical records.    50 y.o. F with hx of migraine headaches, asthma, seizure disorder, Presenting to the ED for possible seizure. Patient states she was home alone I cannot remember she had a seizure or not. She denies any feelings of syncope, aura, dizziness, numbness, weakness, or confusion.  Now she complains of headache. States it is generalized, throbbing in nature. She has a history of chronic migraines. She is followed by neurology, Dr. Narda Amber. She is prescribed Imitrex for headaches, however did not take it this morning as she was feeling nauseated date. She's not had any vomiting. She is not currently on any type of anticoagulation. States she has been taking her Depakote for seizures as prescribed. No fever, chills, or neck pain.  Past Medical History:  Diagnosis Date  . Asthma   . Headache(784.0)   . Seizures [facility])     Patient Active Problem List   Diagnosis Date Noted  . Chronic migraine 09/07/2016  . Seizures ([place]) 08/21/2016  . Seizure ([place]) 08/20/2016  . Asthma 08/20/2016  . Nausea and vomiting 08/20/2016  . Elevated blood pressure 08/20/2016  . DENTAL PAIN 02/12/2009  . DVT, HX OF 01/20/2008  . FIBROIDS, UTERUS 01/16/2008  . COUGH 01/16/2008  . EMBOLISM/THROMBOSIS, DEEP VSL LWR EXTRM NOS 06/03/2007    Past Surgical History:  Procedure Laterality Date  . HERNIA REPAIR    . UTERINE FIBROID SURGERY      OB History    Gravida Para Term Preterm AB Living   3       1 2    SAB TAB Ectopic Multiple Live Births   1 0             Home Medications    Prior to Admission medications   Medication Sig Start Date End Date Taking? Authorizing Provider  acetaminophen (TYLENOL) 325 MG tablet Take 325 mg  by mouth every 6 (six) hours as needed for moderate pain or headache.    Historical Provider, MD  divalproex (DEPAKOTE ER) 500 MG 24 hr tablet Take 2 tablets (1,000 mg total) by mouth daily. 09/07/16   Marcial Pacas, MD  SUMAtriptan (IMITREX) 50 MG tablet Take 1 tablet (50 mg total) by mouth every 2 (two) hours as needed for migraine (at the onset of migraine). May repeat in 2 hours if headache persists or recurs. 09/07/16   Marcial Pacas, MD    Family History Family History  Problem Relation Age of Onset  . Heart disease Mother   . Migraines Mother   . Heart attack Father     Social History Social History  Substance Use Topics  . Smoking status: Never Smoker  . Smokeless tobacco: Never Used  . Alcohol use No     Allergies   Dilantin [phenytoin] and Aspirin   Review of Systems Review of Systems  Neurological: Positive for headaches.  All other systems reviewed and are negative.    Physical Exam Updated Vital Signs BP 153/87 (BP Location: Right Arm)   Pulse 84   Temp 98 F (36.7 C) (Oral)   Resp 18   LMP 03/08/2016   Physical Exam  Constitutional: She is oriented to person, place, and time. She  appears well-developed and well-nourished. No distress.  Lying in bed, head covered with blankets  HENT:  Head: Normocephalic and atraumatic.  Right Ear: Tympanic membrane and external ear normal.  Left Ear: Tympanic membrane and external ear normal.  Nose: Nose normal.  Mouth/Throat: Uvula is midline, oropharynx is clear and moist and mucous membranes are normal.  Head is atraumatic, no tongue laceration or abrasion, dentition intact  Eyes: Conjunctivae and EOM are normal. Pupils are equal, round, and reactive to light.  Neck: Normal range of motion and full passive range of motion without pain. Neck supple. No neck rigidity.  No rigidity, no meningismus  Cardiovascular: Normal rate, regular rhythm and normal heart sounds.   No murmur heard. Pulmonary/Chest: Effort normal and  breath sounds normal. No respiratory distress. She has no wheezes. She has no rhonchi.  Abdominal: Soft. Bowel sounds are normal. There is no tenderness. There is no guarding.  Musculoskeletal: Normal range of motion. She exhibits no edema.  Neurological: She is alert and oriented to person, place, and time. She has normal strength. She displays no tremor. No cranial nerve deficit or sensory deficit. She displays no seizure activity.  AAOx3, answering questions and following commands appropriately; equal strength UE and LE bilaterally; CN grossly intact; moves all extremities appropriately without ataxia; no focal neuro deficits or facial asymmetry appreciated  Skin: Skin is warm and dry. No rash noted. She is not diaphoretic.  Psychiatric: She has a normal mood and affect. Her behavior is normal. Thought content normal.  Nursing note and vitals reviewed.    ED Treatments / Results  Labs (all labs ordered are listed, but only abnormal results are displayed) Labs Reviewed  CBC - Abnormal; Notable for the following:       Result Value   WBC 11.9 (*)    All other components within normal limits  BASIC METABOLIC PANEL  VALPROIC ACID LEVEL  CBG MONITORING, ED    EKG  EKG Interpretation None       Radiology No results found.  Procedures Procedures (including critical care time)  Medications Ordered in ED Medications - No data to display   Initial Impression / Assessment and Plan / ED Course  I have reviewed the triage vital signs and the nursing notes.  Pertinent labs & imaging results that were available during my care of the patient were reviewed by me and considered in my medical decision making (see chart for details).  Clinical Course   50 year old female here with headache. Hx of migraines.  Questionable seizure this morning, however patient was alone. She does not remember if this happened or not. States she has been compliant with her depakote.  Her head is  atraumatic, no tongue laceration, dentition intact.  Here she is neurologically intact.  No clinical signs of meningitis.  Labs pending including depakote level.  IVF, benadryl, reglan given.  Labs are reassuring. Depakote level within normal limits. Headache resolved here with migraine cocktail. Remains neurologically intact. No tremors or seizure activity here. Given her benign labs and therapeutic Depakote level without signs of trauma on exam, feel active seizure this morning less likely. She was encouraged to continue home medications and follow-up with her neurologist.  Discussed plan with patient, she acknowledged understanding and agreed with plan of care.  Return precautions given for new or worsening symptoms.  Final Clinical Impressions(s) / ED Diagnoses   Final diagnoses:  Nonintractable headache, unspecified chronicity pattern, unspecified headache type    New Prescriptions Discharge Medication List  as of 09/13/2016  5:25 PM       Larene Pickett, PA-C 09/13/16 Lysle Morales, MD 09/14/16 [phone]

## 2016-10-11 MED FILL — ?DIVALPROEX SOD ER 500 MG T: 500 MG | 30 days supply | Qty: 60 | Fill #1

## 2016-11-07 ENCOUNTER — Telehealth: Payer: Self-pay | Admitting: *Deleted

## 2016-11-07 ENCOUNTER — Ambulatory Visit: Payer: Self-pay | Admitting: Adult Health

## 2016-11-07 NOTE — Telephone Encounter (Signed)
Received call from patient stating she attempted several times to call yesterday and reschedule her FU today. She currently has insurance issues, also has not had her MRI done due to possible fragments left from a previous surgery of a filter placement. She stated she will be calling [facility] today to schedule MRI now as she has obtained information they requested regarding her previous surgery. Rescheduled her follow up for mid Jan. Patient verbalized understanding, appreciation.

## 2016-11-14 MED FILL — ?DIVALPROEX SOD ER 500 MG T: 500 MG | 30 days supply | Qty: 60 | Fill #2

## 2016-11-15 ENCOUNTER — Emergency Department (HOSPITAL_COMMUNITY)
Admission: EM | Admit: 2016-11-15 | Discharge: 2016-11-15 | Disposition: A | Discharge status: Home / Self Care | Payer: No Typology Code available for payment source | Attending: Emergency Medicine | Admitting: Emergency Medicine

## 2016-11-15 ENCOUNTER — Encounter (HOSPITAL_COMMUNITY): Payer: Self-pay

## 2016-11-15 DIAGNOSIS — R569 Unspecified convulsions: Secondary | ICD-10-CM | POA: Insufficient documentation

## 2016-11-15 DIAGNOSIS — Z79899 Other long term (current) drug therapy: Secondary | ICD-10-CM | POA: Insufficient documentation

## 2016-11-15 DIAGNOSIS — J45909 Unspecified asthma, uncomplicated: Secondary | ICD-10-CM | POA: Insufficient documentation

## 2016-11-15 LAB — VALPROIC ACID LEVEL: VALPROIC ACID LVL: 40 ug/mL — AB (ref 50.0–100.0)

## 2016-11-15 LAB — I-STAT CHEM 8, ED
BUN: 16 mg/dL (ref 6–20)
CHLORIDE: 104 mmol/L (ref 101–111)
CREATININE: 0.9 mg/dL (ref 0.44–1.00)
Calcium, Ion: 1.19 mmol/L (ref 1.15–1.40)
Glucose, Bld: 87 mg/dL (ref 65–99)
HEMATOCRIT: 46 % (ref 36.0–46.0)
HEMOGLOBIN: 15.6 g/dL — AB (ref 12.0–15.0)
POTASSIUM: 4 mmol/L (ref 3.5–5.1)
Sodium: 141 mmol/L (ref 135–145)
TCO2: 28 mmol/L (ref 0–100)

## 2016-11-15 LAB — HEPATIC FUNCTION PANEL
ALT: 10 U/L — ABNORMAL LOW (ref 14–54)
AST: 21 U/L (ref 15–41)
Albumin: 4.6 g/dL (ref 3.5–5.0)
Alkaline Phosphatase: 62 U/L (ref 38–126)
TOTAL PROTEIN: 8.5 g/dL — AB (ref 6.5–8.1)
Total Bilirubin: 0.6 mg/dL (ref 0.3–1.2)

## 2016-11-15 LAB — CBG MONITORING, ED: GLUCOSE-CAPILLARY: 79 mg/dL (ref 65–99)

## 2016-11-15 MED ORDER — DIVALPROEX SODIUM ER 500 MG PO TB24: 500.0000 mg | ORAL_TABLET | ORAL | Status: DC

## 2016-11-15 MED ORDER — LORAZEPAM 2 MG/ML IJ SOLN
0.5000 mg | Freq: Once | INTRAMUSCULAR | Status: AC
  Administered 2016-11-15: 0.5 mg via INTRAVENOUS
  Filled 2016-11-15: qty 1

## 2016-11-15 NOTE — ED Notes (Signed)
Frederik Pear (daughter) wants to be contacted for pt discharge at [phone]

## 2016-11-15 NOTE — ED Notes (Signed)
Bed: WA06 Expected date:  Expected time:  Means of arrival:  Comments: EMS 50 yo female with multiple seizures-now awake and alert

## 2016-11-15 NOTE — ED Provider Notes (Signed)
[facility] DEPT Provider Note   CSN: [id number] Arrival date & time: 11/15/16  [id number]     History   Chief Complaint Chief Complaint  Patient presents with  . Seizures    HPI [name] is a 50 y.o. female.  HPI Patient is on Depakote for seizures and presents the emergency department after noted to have several seizures while in caps this morning.  She did not fall.  She is postictal on EMS arrival.  She sees Dr. Krista Blue with neurology.  She reports compliance with her Depakote which is 1000 mg of extended release Depakote every night.  She denies headache.  No recent illness.  No fevers or chills.  No neck pain or stiffness.  No injury.  She is slightly still postictal on arrival to the ER   Past Medical History:  Diagnosis Date  . Asthma   . Headache(784.0)   . Seizures [facility])     Patient Active Problem List   Diagnosis Date Noted  . Chronic migraine 09/07/2016  . Seizures ([place]) 08/21/2016  . Seizure ([place]) 08/20/2016  . Asthma 08/20/2016  . Nausea and vomiting 08/20/2016  . Elevated blood pressure 08/20/2016  . DENTAL PAIN 02/12/2009  . DVT, HX OF 01/20/2008  . FIBROIDS, UTERUS 01/16/2008  . COUGH 01/16/2008  . EMBOLISM/THROMBOSIS, DEEP VSL LWR EXTRM NOS 06/03/2007    Past Surgical History:  Procedure Laterality Date  . HERNIA REPAIR    . UTERINE FIBROID SURGERY      OB History    Gravida Para Term Preterm AB Living   3       1 2    SAB TAB Ectopic Multiple Live Births   1 0             Home Medications    Prior to Admission medications   Medication Sig Start Date End Date Taking? Authorizing Provider  acetaminophen (TYLENOL) 325 MG tablet Take 325 mg by mouth every 6 (six) hours as needed for moderate pain or headache.   Yes Historical Provider, MD  divalproex (DEPAKOTE ER) 500 MG 24 hr tablet Take 2 tablets (1,000 mg total) by mouth daily. 09/07/16  Yes Marcial Pacas, MD  SUMAtriptan (IMITREX) 50 MG tablet Take 1 tablet (50 mg total) by mouth every  2 (two) hours as needed for migraine (at the onset of migraine). May repeat in 2 hours if headache persists or recurs. 09/07/16  Yes Marcial Pacas, MD    Family History Family History  Problem Relation Age of Onset  . Heart disease Mother   . Migraines Mother   . Heart attack Father     Social History Social History  Substance Use Topics  . Smoking status: Never Smoker  . Smokeless tobacco: Never Used  . Alcohol use No     Allergies   Dilantin [phenytoin] and Aspirin   Review of Systems Review of Systems  All other systems reviewed and are negative.    Physical Exam Updated Vital Signs BP 121/87 (BP Location: Right Arm)   Pulse 73   Temp 98.3 F (36.8 C) (Oral)   Resp 20   LMP 03/08/2016   SpO2 100%   Physical Exam  Constitutional: She is oriented to person, place, and time. She appears well-developed and well-nourished. No distress.  HENT:  Head: Normocephalic and atraumatic.  Eyes: EOM are normal. Pupils are equal, round, and reactive to light.  Neck: Normal range of motion.  Cardiovascular: Normal rate, regular rhythm and normal heart sounds.  Pulmonary/Chest: Effort normal and breath sounds normal.  Abdominal: Soft. She exhibits no distension. There is no tenderness.  Musculoskeletal: Normal range of motion.  Neurological: She is alert and oriented to person, place, and time.  5/5 strength in major muscle groups of  bilateral upper and lower extremities. Speech normal. No facial asymetry.   Skin: Skin is warm and dry.  Psychiatric: She has a normal mood and affect. Judgment normal.  Nursing note and vitals reviewed.    ED Treatments / Results  Labs (all labs ordered are listed, but only abnormal results are displayed) Labs Reviewed  VALPROIC ACID LEVEL - Abnormal; Notable for the following:       Result Value   Valproic Acid Lvl 40 (*)    All other components within normal limits  HEPATIC FUNCTION PANEL - Abnormal; Notable for the following:     Total Protein 8.5 (*)    ALT 10 (*)    Bilirubin, Direct <0.1 (*)    All other components within normal limits  I-STAT CHEM 8, ED - Abnormal; Notable for the following:    Hemoglobin 15.6 (*)    All other components within normal limits  CBG MONITORING, ED    EKG  EKG Interpretation None       Radiology No results found.  Procedures Procedures (including critical care time)  Medications Ordered in ED Medications - No data to display   Initial Impression / Assessment and Plan / ED Course  I have reviewed the triage vital signs and the nursing notes.  Pertinent labs & imaging results that were available during my care of the patient were reviewed by me and considered in my medical decision making (see chart for details).  Clinical Course     Well-appearing.  Neurology follow-up.  Patient understands return to the ER for new or worsening symptoms.  Breakthrough seizure.  Patient will need to take her Depakote as prescribed.  I suspect they're slightly low Depakote level today that she may not be as compliant as she is stating.  I've asked that she restart her Depakote at her normal dose and frequency  Final Clinical Impressions(s) / ED Diagnoses   Final diagnoses:  Seizure [facility])    New Prescriptions New Prescriptions   No medications on file     Jola Schmidt, MD 11/15/16 1021

## 2016-11-15 NOTE — ED Notes (Signed)
Pt had multiple seizures while on the couch this am, was brought to the floor with several pillows under her, no fall Pt was postictal upon EMS arrival,  Pt has hx of seizures and hypertension

## 2016-11-15 NOTE — ED Notes (Signed)
Patient daughter called for transportation back home.

## 2016-12-08 IMAGING — DX DG CHEST 2V
2 series · 2 of 2 positions shown · non-contrast
Comparison: Chest x-ray dated 09/07/2013.

CLINICAL DATA: Tingling chest pain for 1 week. Also with tingling
over left side of body especially in left finger tips. Ex-smoker.

EXAM:
CHEST  2 VIEW

[chest pa]
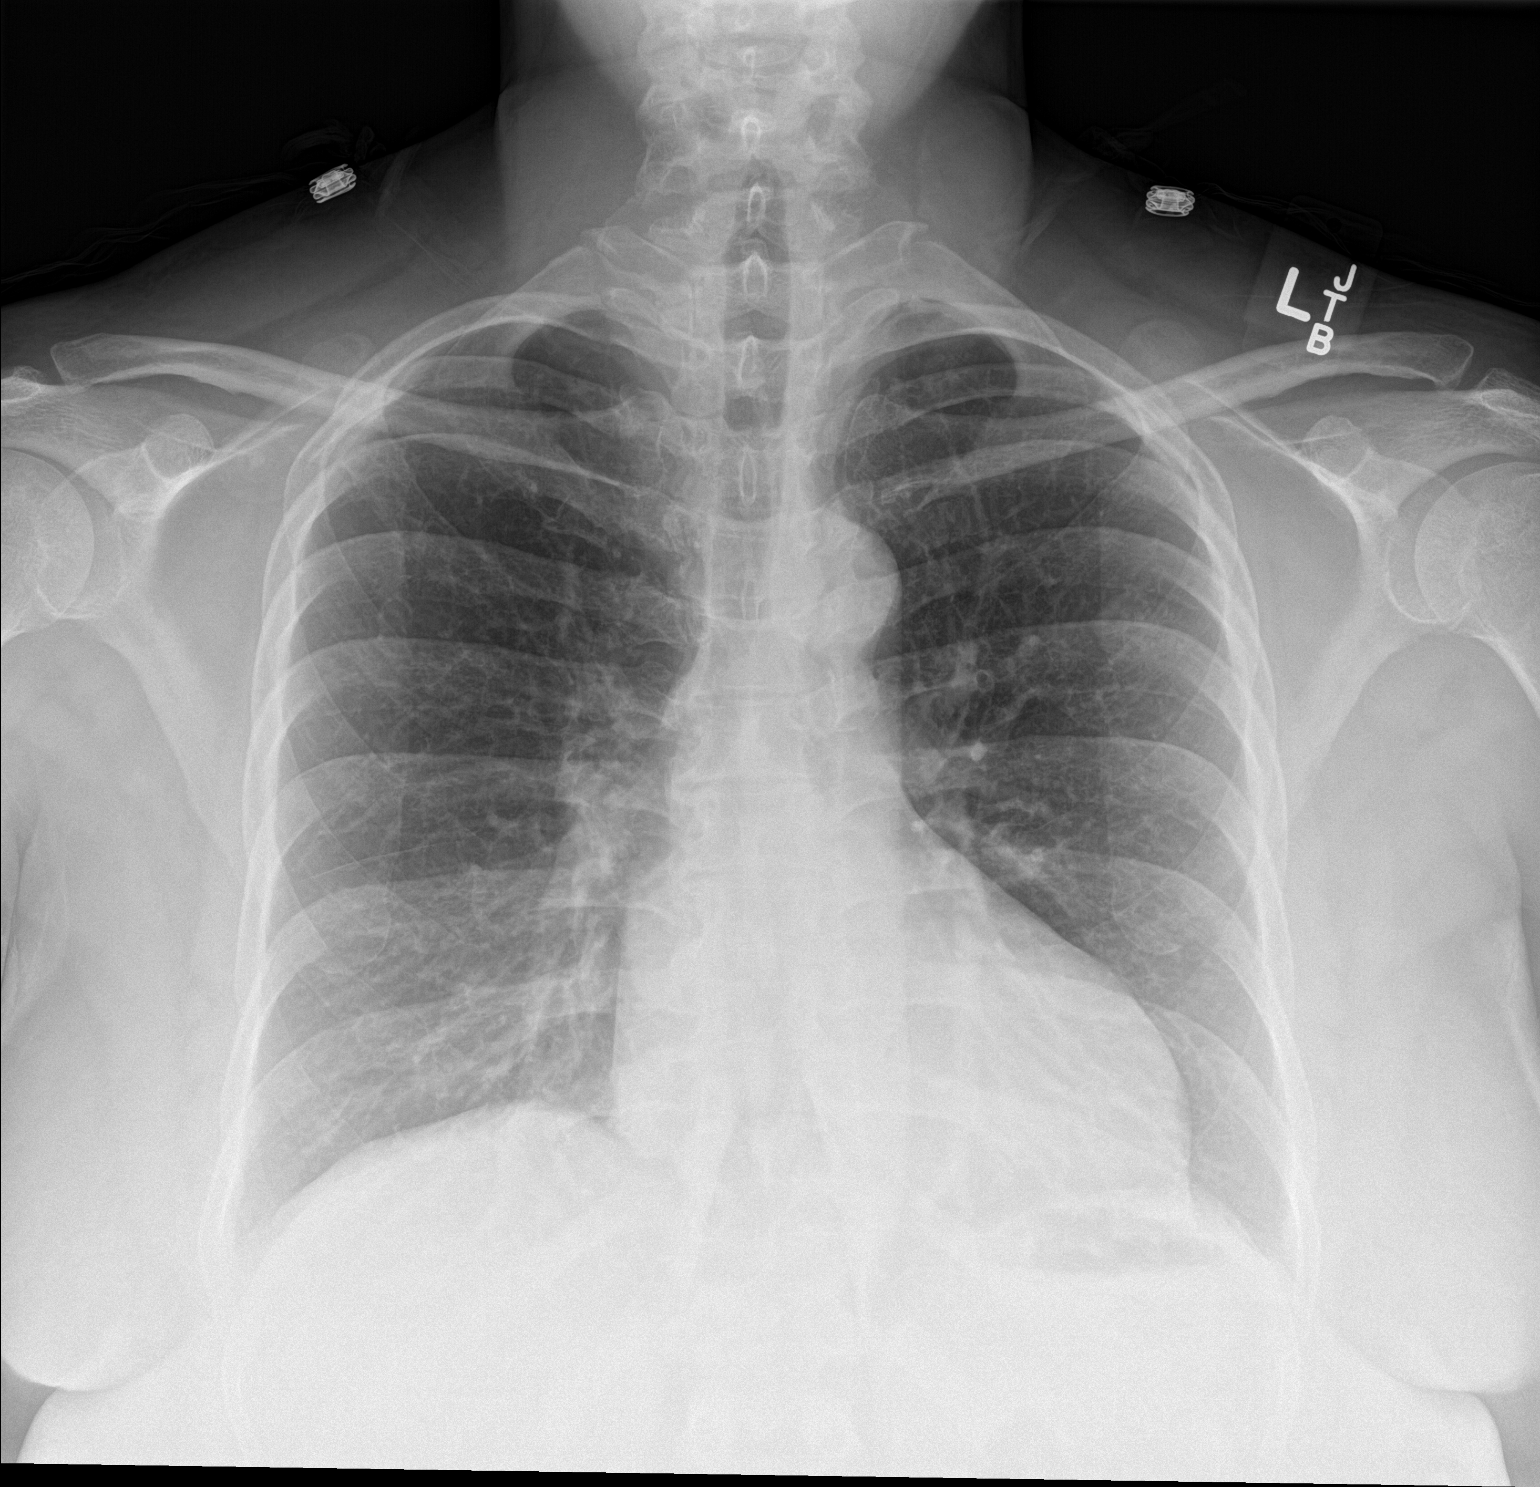

[chest lat]
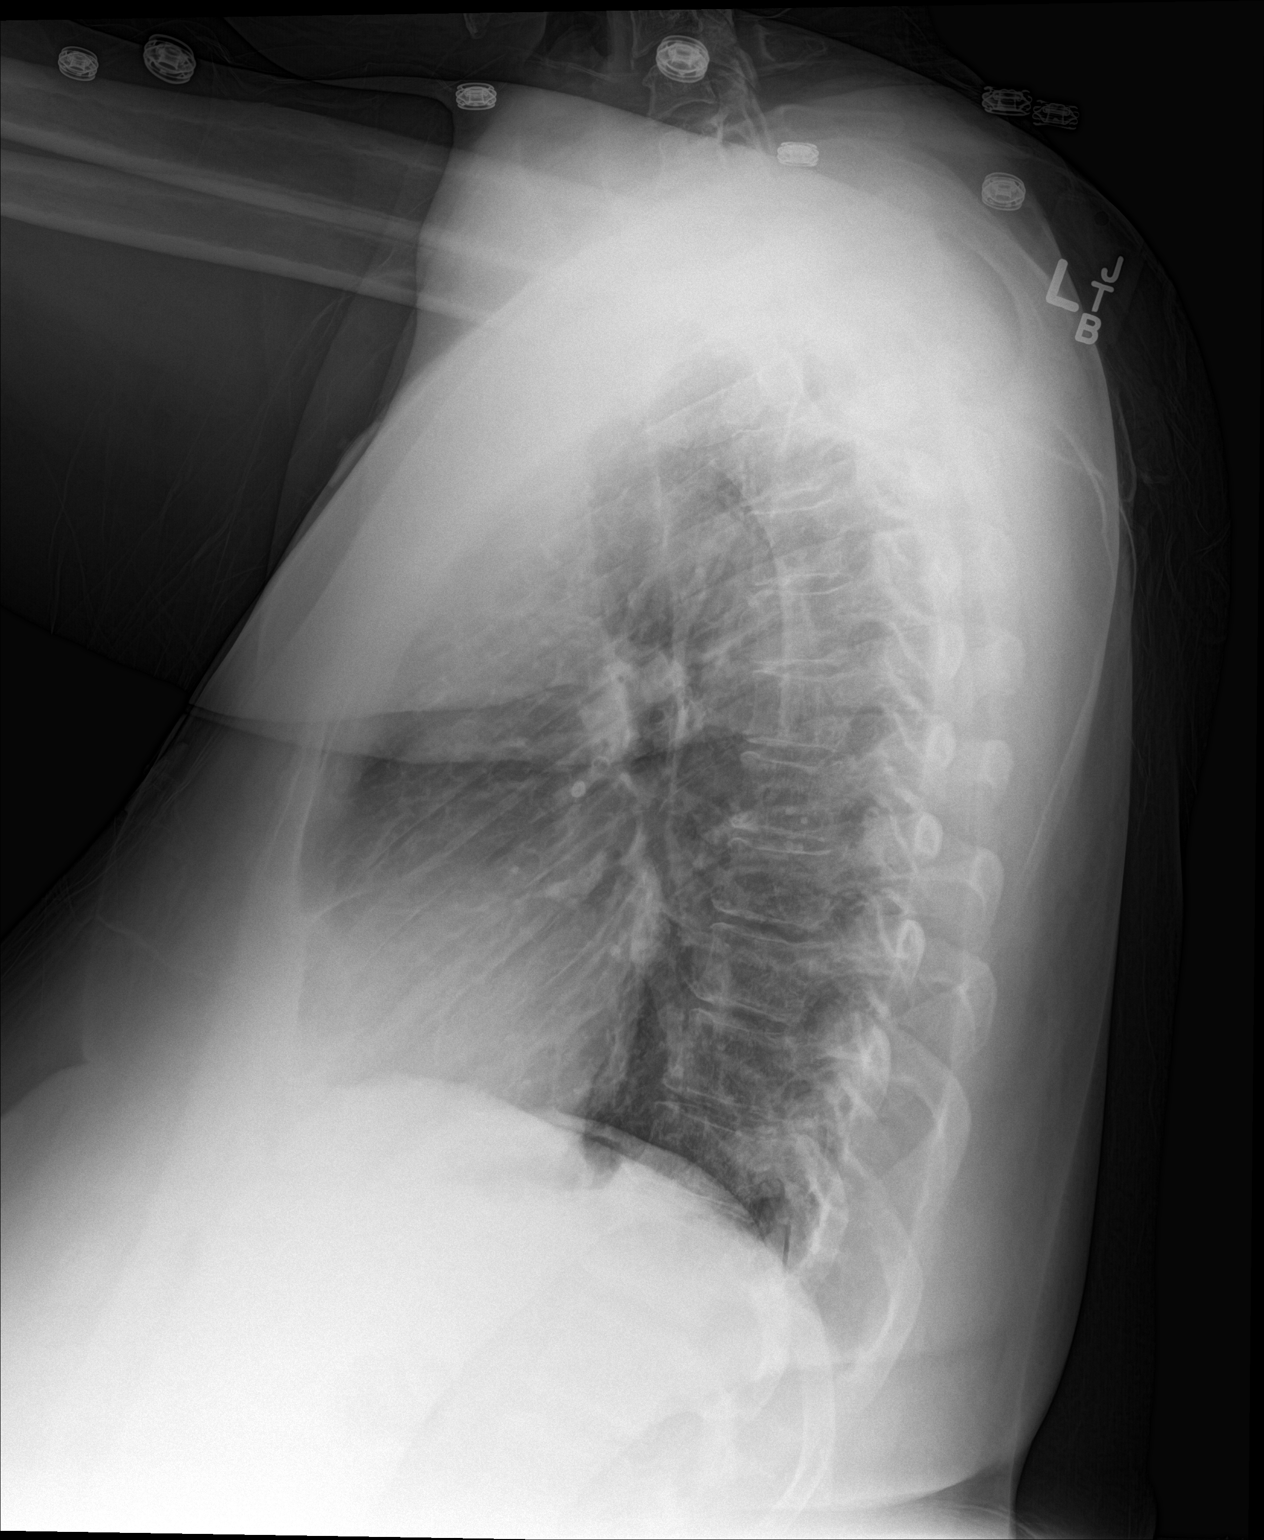

[2 of 2 positions shown; findings below may reference images not displayed]

FINDINGS: Heart size is upper normal, unchanged. Overall cardiomediastinal
silhouette is stable. Lungs are clear. Lung volumes are normal. No
pleural effusion. No pneumothorax.

Minimal degenerative change within the thoracic spine. No acute
osseous abnormality seen.
IMPRESSION: Stable chest x-ray. No evidence of acute cardiopulmonary
abnormality.

## 2016-12-11 ENCOUNTER — Telehealth: Payer: Self-pay | Admitting: *Deleted

## 2016-12-11 NOTE — Telephone Encounter (Signed)
Patient is scheduled to see Megan on 12/13/16 for a 68mth MRI f/u . Patient states she has not had her MRI yet. No one has called her. Her call back number is [phone]. Please advise. Thank you .

## 2016-12-11 NOTE — Telephone Encounter (Signed)
I called the patient back and informed her that it looked like she just needed to contact [facility] to scheduling because in the notes it said the patient was going to call [facility] to get information and call them back. She understood and took GI number [phone] and she rescheduled her appointment with Megan for 01/09/17 hoping to get her MRI before then.

## 2016-12-13 ENCOUNTER — Ambulatory Visit: Payer: Self-pay | Admitting: Adult Health

## 2016-12-20 MED FILL — ?DIVALPROEX SOD ER 500 MG T: 500 MG | 30 days supply | Qty: 60 | Fill #3

## 2016-12-26 ENCOUNTER — Encounter (HOSPITAL_COMMUNITY): Payer: Self-pay | Admitting: Emergency Medicine

## 2016-12-26 ENCOUNTER — Emergency Department (HOSPITAL_COMMUNITY)
Admission: EM | Admit: 2016-12-26 | Discharge: 2016-12-26 | Disposition: A | Discharge status: Home / Self Care | Payer: No Typology Code available for payment source | Attending: Emergency Medicine | Admitting: Emergency Medicine

## 2016-12-26 DIAGNOSIS — R569 Unspecified convulsions: Secondary | ICD-10-CM | POA: Insufficient documentation

## 2016-12-26 DIAGNOSIS — G43009 Migraine without aura, not intractable, without status migrainosus: Secondary | ICD-10-CM | POA: Insufficient documentation

## 2016-12-26 DIAGNOSIS — J45909 Unspecified asthma, uncomplicated: Secondary | ICD-10-CM | POA: Insufficient documentation

## 2016-12-26 DIAGNOSIS — Z79899 Other long term (current) drug therapy: Secondary | ICD-10-CM | POA: Insufficient documentation

## 2016-12-26 LAB — CBC
HEMATOCRIT: 40.8 % (ref 36.0–46.0)
HEMOGLOBIN: 13.2 g/dL (ref 12.0–15.0)
MCH: 28.4 pg (ref 26.0–34.0)
MCHC: 32.4 g/dL (ref 30.0–36.0)
MCV: 87.9 fL (ref 78.0–100.0)
Platelets: 224 10*3/uL (ref 150–400)
RBC: 4.64 MIL/uL (ref 3.87–5.11)
RDW: 13.7 % (ref 11.5–15.5)
WBC: 6.8 10*3/uL (ref 4.0–10.5)

## 2016-12-26 LAB — BASIC METABOLIC PANEL
Anion gap: 9 (ref 5–15)
BUN: 6 mg/dL (ref 6–20)
CHLORIDE: 107 mmol/L (ref 101–111)
CO2: 25 mmol/L (ref 22–32)
CREATININE: 0.78 mg/dL (ref 0.44–1.00)
Calcium: 9.4 mg/dL (ref 8.9–10.3)
GFR calc non Af Amer: >60 mL/min (ref >60)
GLUCOSE: 89 mg/dL (ref 65–99)
Potassium: 4 mmol/L (ref 3.5–5.1)
Sodium: 141 mmol/L (ref 135–145)

## 2016-12-26 LAB — CBG MONITORING, ED: Glucose-Capillary: 102 mg/dL — ABNORMAL HIGH (ref 65–99)

## 2016-12-26 LAB — VALPROIC ACID LEVEL: Valproic Acid Lvl: 56 ug/mL (ref 50.0–100.0)

## 2016-12-26 MED ORDER — SODIUM CHLORIDE 0.9 % IV BOLUS (SEPSIS)
1000.0000 mL | Freq: Once | INTRAVENOUS | Status: AC
  Administered 2016-12-26: 1000 mL via INTRAVENOUS

## 2016-12-26 MED ORDER — ONDANSETRON 4 MG PO TBDP: 4.0000 mg | ORAL_TABLET | Freq: Once | ORAL | Status: DC

## 2016-12-26 MED ORDER — DIPHENHYDRAMINE HCL 50 MG/ML IJ SOLN
25.0000 mg | Freq: Once | INTRAMUSCULAR | Status: AC
Start: 2016-12-26 — End: 2016-12-26
  Administered 2016-12-26: 25 mg via INTRAVENOUS
  Filled 2016-12-26: qty 1

## 2016-12-26 MED ORDER — METOCLOPRAMIDE HCL 5 MG/ML IJ SOLN
10.0000 mg | Freq: Once | INTRAMUSCULAR | Status: AC
  Administered 2016-12-26: 10 mg via INTRAVENOUS
  Filled 2016-12-26: qty 2

## 2016-12-26 NOTE — Discharge Instructions (Signed)
Please follow up with your already scheduled neurology appointment.

## 2016-12-26 NOTE — ED Triage Notes (Signed)
Pt states "i had a seizure, I woke up this morning and I had a seizure, I bit my tongue really, and I urinated on myself". Pt c/o headache. Pt takes depakote for seizures, has epilepsy. Pt has no neuro deficits, ambulating without issue.

## 2016-12-26 NOTE — ED Provider Notes (Signed)
Tammy [name] Arrival date & time: 12/26/16  1044     History   Chief Complaint Chief Complaint  Patient presents with  . Seizures    HPI Tammy [name] is a 51 y.o. female.  HPI 31yoF with hx of epilepsy and migraines with concern for a seizure approximately 4 hours ago. She states she woke up and went to the bathroom and "felt funny" so she laid back on the bed and then she woke up with a Ha, pain in her tongue and she had urinated on herself. Episode was unwitnessed. Unknown time of episode, Unknown post-ictal state. Was lying on the bed when this happened, did not fall or hit her head. No recent falls or trauma. Has had increased level of stress as she is helping take care of her 3 grandchildren. Takes 1000mg  dilantin BID and states that other than missing one dose on Sunday, she has been compliant with meds. Has a neurology f/u appointment scheduled for the beginning of February. Now complains of nausea and generalized sharp HA similar to past migraines. She states she typically gets N/V after a seizure and Ha.   Past Medical History:  Diagnosis Date  . Asthma   . Headache(784.0)   . Seizures [facility])     Patient Active Problem List   Diagnosis Date Noted  . Chronic migraine 09/07/2016  . Seizures ([place]) 08/21/2016  . Seizure ([place]) 08/20/2016  . Asthma 08/20/2016  . Nausea and vomiting 08/20/2016  . Elevated blood pressure 08/20/2016  . DENTAL PAIN 02/12/2009  . DVT, HX OF 01/20/2008  . FIBROIDS, UTERUS 01/16/2008  . COUGH 01/16/2008  . EMBOLISM/THROMBOSIS, DEEP VSL LWR EXTRM NOS 06/03/2007    Past Surgical History:  Procedure Laterality Date  . HERNIA REPAIR    . UTERINE FIBROID SURGERY      OB History    Gravida Para Term Preterm AB Living   3       1 2    SAB TAB Ectopic Multiple Live Births   1 0             Home Medications    Prior to Admission medications   Medication Sig Start Date End Date Taking?  Authorizing Provider  acetaminophen (TYLENOL) 325 MG tablet Take 325 mg by mouth every 6 (six) hours as needed for moderate pain or headache.    Historical Provider, MD  divalproex (DEPAKOTE ER) 500 MG 24 hr tablet Take 2 tablets (1,000 mg total) by mouth daily. 09/07/16   Marcial Pacas, MD  SUMAtriptan (IMITREX) 50 MG tablet Take 1 tablet (50 mg total) by mouth every 2 (two) hours as needed for migraine (at the onset of migraine). May repeat in 2 hours if headache persists or recurs. 09/07/16   Marcial Pacas, MD    Family History Family History  Problem Relation Age of Onset  . Heart disease Mother   . Migraines Mother   . Heart attack Father     Social History Social History  Substance Use Topics  . Smoking status: Never Smoker  . Smokeless tobacco: Never Used  . Alcohol use No     Allergies   Dilantin [phenytoin] and Aspirin   Review of Systems Review of Systems  Constitutional: Negative for chills and fever.  HENT: Negative for ear pain and sore throat.   Eyes: Negative for pain and visual disturbance.  Respiratory: Negative for cough and shortness of breath.   Cardiovascular: Negative for chest pain  and palpitations.  Gastrointestinal: Positive for nausea and vomiting. Negative for abdominal pain.  Genitourinary: Negative for dysuria and hematuria.  Musculoskeletal: Negative for arthralgias, back pain and neck pain.  Skin: Negative for color change and rash.  Neurological: Positive for seizures and headaches. Negative for syncope, facial asymmetry and weakness.  All other systems reviewed and are negative.    Physical Exam Updated Vital Signs BP (!) 144/101   Pulse 72   Temp 97.7 F (36.5 C) (Oral)   Resp 18   LMP 03/08/2016   SpO2 100%   Physical Exam  Constitutional: She is oriented to person, place, and time. She appears well-developed and well-nourished. No distress.  HENT:  Head: Normocephalic and atraumatic.  Small abrasion to R lateral tongue, hemostatic    Eyes: Conjunctivae are normal.  Neck: Normal range of motion. Neck supple.  Cardiovascular: Normal rate and regular rhythm.   No murmur Tammy. Pulmonary/Chest: Effort normal and breath sounds normal. No respiratory distress.  Abdominal: Soft. There is no tenderness.  Musculoskeletal: She exhibits no edema.  Neurological: She is alert and oriented to person, place, and time. She has normal strength and normal reflexes. No cranial nerve deficit or sensory deficit. Coordination and gait normal. GCS eye subscore is 4. GCS verbal subscore is 5. GCS motor subscore is 6.  CN 2-12 grossly intact. Sensation intact. 5/5 strength in all extremities.  Skin: Skin is warm and dry. Capillary refill takes less than 2 seconds.  Psychiatric: She has a normal mood and affect.  Nursing note and vitals reviewed.    ED Treatments / Results  Labs (all labs ordered are listed, but only abnormal results are displayed) Labs Reviewed  CBG MONITORING, ED - Abnormal; Notable for the following:       Result Value   Glucose-Capillary 102 (*)    All other components within normal limits  VALPROIC ACID LEVEL  CBC  BASIC METABOLIC PANEL    EKG  EKG Interpretation  Date/Time:  Tuesday December 26 2016 10:57:09 EST Ventricular Rate:  87 PR Interval:  164 QRS Duration: 78 QT Interval:  390 QTC Calculation: 469 R Axis:   74 Text Interpretation:  Sinus rhythm with frequent Premature ventricular complexes and Premature atrial complexes Confirmed by Ashok Cordia  MD, Lennette Bihari ([id number]) on 12/26/2016 1:46:41 PM       Radiology No results found.  Procedures Procedures (including critical care time)  Medications Ordered in ED Medications  sodium chloride 0.9 % bolus 1,000 mL (0 mLs Intravenous Stopped 12/26/16 1511)  metoCLOPramide (REGLAN) injection 10 mg (10 mg Intravenous Given 12/26/16 1424)  diphenhydrAMINE (BENADRYL) injection 25 mg (25 mg Intravenous Given 12/26/16 1424)     Initial Impression / Assessment and  Plan / ED Course  I have reviewed the triage vital signs and the nursing notes.  Pertinent labs & imaging results that were available during my care of the patient were reviewed by me and considered in my medical decision making (see chart for details).    51 year old female with a history of epilepsy on Dilantin states that she has been compliant with her medicine presenting with concerns for seizure. Seizure was not witnessed. She had urinary incontinence and tongue biting. No falls or recent traumas. Did not fall during this event. Patient is back to normal with a nonfocal neuro exam. Neuroimaging is not indicated at this time.  EKG with a sinus rhythm with frequent PVCs. Dilantin level therapeutic. CBC and BMP are grossly unremarkable with no signs of electrolyte  abnormalities.  Patient points of a severe generalized headache that is similar to her past migraines. Will be given headache cocktail for migraine and nausea.  HA now resolved. She states she has an already scheduled neuro appointment in approx a week. Will have her f/u with neuro for breakthrough seizure. No seizure activity in the Ed. Pt at baseline. Strict return precautions given.  Patient care discussed and supervised by my attending, Dr. Ashok Cordia. Drucie Ip, MD   Final Clinical Impressions(s) / ED Diagnoses   Final diagnoses:  Seizure ([place])  Migraine without aura and without status migrainosus, not intractable    New Prescriptions New Prescriptions   No medications on file     Jabari Swoveland [place] Izzabella Besse, MD 12/26/16 [place], MD 12/27/16 [phone]

## 2016-12-26 NOTE — ED Notes (Signed)
RN attempted IV 2x unsuccessfully. Will have another RN attempt.  

## 2017-01-09 ENCOUNTER — Ambulatory Visit: Payer: Self-pay | Admitting: Adult Health

## 2017-01-10 ENCOUNTER — Encounter: Payer: Self-pay | Admitting: Adult Health

## 2017-01-25 MED FILL — ?DIVALPROEX SOD ER 500 MG T: 500 MG | 30 days supply | Qty: 60 | Fill #4

## 2017-02-08 ENCOUNTER — Encounter (HOSPITAL_COMMUNITY): Payer: Self-pay

## 2017-02-08 ENCOUNTER — Emergency Department (HOSPITAL_COMMUNITY)
Admission: EM | Admit: 2017-02-08 | Discharge: 2017-02-08 | Disposition: A | Discharge status: Home / Self Care | Payer: Self-pay | Attending: Emergency Medicine | Admitting: Emergency Medicine

## 2017-02-08 ENCOUNTER — Emergency Department (HOSPITAL_COMMUNITY): Payer: Self-pay

## 2017-02-08 DIAGNOSIS — G4489 Other headache syndrome: Secondary | ICD-10-CM | POA: Insufficient documentation

## 2017-02-08 DIAGNOSIS — R569 Unspecified convulsions: Secondary | ICD-10-CM | POA: Insufficient documentation

## 2017-02-08 DIAGNOSIS — J45909 Unspecified asthma, uncomplicated: Secondary | ICD-10-CM | POA: Insufficient documentation

## 2017-02-08 DIAGNOSIS — Z79899 Other long term (current) drug therapy: Secondary | ICD-10-CM | POA: Insufficient documentation

## 2017-02-08 LAB — CBC WITH DIFFERENTIAL/PLATELET
BASOS PCT: 0 %
Basophils Absolute: 0 10*3/uL (ref 0.0–0.1)
EOS ABS: 0 10*3/uL (ref 0.0–0.7)
Eosinophils Relative: 0 %
HEMATOCRIT: 41.6 % (ref 36.0–46.0)
HEMOGLOBIN: 13.4 g/dL (ref 12.0–15.0)
Lymphocytes Relative: 9 %
Lymphs Abs: 1 10*3/uL (ref 0.7–4.0)
MCH: 28.1 pg (ref 26.0–34.0)
MCHC: 32.2 g/dL (ref 30.0–36.0)
MCV: 87.2 fL (ref 78.0–100.0)
MONOS PCT: 3 %
Monocytes Absolute: 0.4 10*3/uL (ref 0.1–1.0)
NEUTROS ABS: 9.7 10*3/uL — AB (ref 1.7–7.7)
NEUTROS PCT: 88 %
Platelets: 240 10*3/uL (ref 150–400)
RBC: 4.77 MIL/uL (ref 3.87–5.11)
RDW: 13.6 % (ref 11.5–15.5)
WBC: 11.1 10*3/uL — AB (ref 4.0–10.5)

## 2017-02-08 LAB — BASIC METABOLIC PANEL
ANION GAP: 9 (ref 5–15)
BUN: 12 mg/dL (ref 6–20)
CHLORIDE: 103 mmol/L (ref 101–111)
CO2: 25 mmol/L (ref 22–32)
Calcium: 9.6 mg/dL (ref 8.9–10.3)
Creatinine, Ser: 0.72 mg/dL (ref 0.44–1.00)
GFR calc non Af Amer: >60 mL/min (ref >60)
Glucose, Bld: 118 mg/dL — ABNORMAL HIGH (ref 65–99)
POTASSIUM: 3.9 mmol/L (ref 3.5–5.1)
SODIUM: 137 mmol/L (ref 135–145)

## 2017-02-08 LAB — URINALYSIS, ROUTINE W REFLEX MICROSCOPIC
BILIRUBIN URINE: NEGATIVE
Glucose, UA: NEGATIVE mg/dL
Hgb urine dipstick: NEGATIVE
KETONES UR: 5 mg/dL — AB
Leukocytes, UA: NEGATIVE
Nitrite: NEGATIVE
PH: 7 (ref 5.0–8.0)
Protein, ur: NEGATIVE mg/dL
SPECIFIC GRAVITY, URINE: 1.018 (ref 1.005–1.030)

## 2017-02-08 LAB — VALPROIC ACID LEVEL: VALPROIC ACID LVL: 61 ug/mL (ref 50.0–100.0)

## 2017-02-08 LAB — POC URINE PREG, ED: PREG TEST UR: NEGATIVE

## 2017-02-08 LAB — CBG MONITORING, ED: Glucose-Capillary: 87 mg/dL (ref 65–99)

## 2017-02-08 MED ORDER — ONDANSETRON 4 MG PO TBDP
ORAL_TABLET | ORAL | Status: AC
  Filled 2017-02-08: qty 1

## 2017-02-08 MED ORDER — DIPHENHYDRAMINE HCL 50 MG/ML IJ SOLN
25.0000 mg | Freq: Once | INTRAMUSCULAR | Status: AC
  Administered 2017-02-08: 25 mg via INTRAVENOUS
  Filled 2017-02-08: qty 1

## 2017-02-08 MED ORDER — PROCHLORPERAZINE EDISYLATE 5 MG/ML IJ SOLN
10.0000 mg | Freq: Once | INTRAMUSCULAR | Status: AC
  Administered 2017-02-08: 10 mg via INTRAVENOUS
  Filled 2017-02-08: qty 2

## 2017-02-08 MED ORDER — SODIUM CHLORIDE 0.9 % IV BOLUS (SEPSIS)
1000.0000 mL | Freq: Once | INTRAVENOUS | Status: AC
  Administered 2017-02-08: 1000 mL via INTRAVENOUS

## 2017-02-08 MED ORDER — ONDANSETRON 4 MG PO TBDP
4.0000 mg | ORAL_TABLET | Freq: Once | ORAL | Status: AC | PRN
  Administered 2017-02-08: 4 mg via ORAL

## 2017-02-08 NOTE — ED Notes (Signed)
Patient CBG was 87, the Nurse was informed, Patient unable to void at this time.

## 2017-02-08 NOTE — ED Notes (Signed)
Patient transported to CT 

## 2017-02-08 NOTE — ED Triage Notes (Signed)
Pt reports she had a seizure this morning "right before it got light outside." She states she was sleeping in the bed and woke up to find that she had urinated on herself and had bit her tongue. Hx of seizures. She reports "my whole body hurts" and states she has been vomiting.

## 2017-02-08 NOTE — ED Provider Notes (Signed)
[facility] DEPT Provider Note   CSN: [id number] Arrival date & time: 02/08/17  1124  By signing my name below, I, Ethelle Lyon Long, attest that this documentation has been prepared under the direction and in the presence of Courtney Paris, MD. Electronically Signed: Ethelle Lyon Long, Scribe. 02/08/2017. 1:38 PM.  History   Chief Complaint Chief Complaint  Patient presents with  . Seizures   The history is provided by the patient and medical records. No language interpreter was used.    HPI Comments:  Tammy [name] is a 51 y.o. female with a PMHx of Seizures, HA, and Asthma, who presents to the Emergency Department complaining of a seizure onset during the night last night and headache this AM. Pt reports she was sleeping and upon waking up noticed she had urinated on herself and bit her tongue. Her last reported seizure was the beginning of March. She has associated symptoms of generalized myalgias, 10/10 HA consistent with her chronic HA, nausea, and vomiting. Per pt, her young grandchildren keep her up at night and reduce the amount of sleep she gets including last night prior to the start of her seizure. She additionally states increased caffeine use of late, no consumption of EtOH. Bright lights exacerbate her HA. She states she has been keeping up with her seizure medication at home. Pt denies urgency, frequency, hematuria, dysuria, difficulty urinating, constipation, diarrhea, change in appetite, fever, chills, rhinorrhea, congestion, cough, and any other complaints at this time.   Past Medical History:  Diagnosis Date  . Asthma   . Headache(784.0)   . Seizures [facility])     Patient Active Problem List   Diagnosis Date Noted  . Chronic migraine 09/07/2016  . Seizures ([place]) 08/21/2016  . Seizure ([place]) 08/20/2016  . Asthma 08/20/2016  . Nausea and vomiting 08/20/2016  . Elevated blood pressure 08/20/2016  . DENTAL PAIN 02/12/2009  . DVT, HX OF 01/20/2008  . FIBROIDS,  UTERUS 01/16/2008  . COUGH 01/16/2008  . EMBOLISM/THROMBOSIS, DEEP VSL LWR EXTRM NOS 06/03/2007    Past Surgical History:  Procedure Laterality Date  . HERNIA REPAIR    . UTERINE FIBROID SURGERY      OB History    Gravida Para Term Preterm AB Living   3       1 2    SAB TAB Ectopic Multiple Live Births   1 0             Home Medications    Prior to Admission medications   Medication Sig Start Date End Date Taking? Authorizing Provider  acetaminophen (TYLENOL) 325 MG tablet Take 325 mg by mouth every 6 (six) hours as needed (headaches or pain).    Yes Historical Provider, MD  divalproex (DEPAKOTE ER) 500 MG 24 hr tablet Take 2 tablets (1,000 mg total) by mouth daily. Patient taking differently: Take 500 mg by mouth 2 (two) times daily.  09/07/16  Yes Marcial Pacas, MD  SUMAtriptan (IMITREX) 50 MG tablet Take 1 tablet (50 mg total) by mouth every 2 (two) hours as needed for migraine (at the onset of migraine). May repeat in 2 hours if headache persists or recurs. 09/07/16  Yes Marcial Pacas, MD    Family History Family History  Problem Relation Age of Onset  . Heart disease Mother   . Migraines Mother   . Heart attack Father     Social History Social History  Substance Use Topics  . Smoking status: Never Smoker  . Smokeless tobacco: Never  Used  . Alcohol use No     Allergies   Dilantin [phenytoin] and Aspirin   Review of Systems Review of Systems  Constitutional: Negative for appetite change, chills and fever.  HENT: Negative for congestion and rhinorrhea.   Eyes: Positive for photophobia. Negative for visual disturbance.  Respiratory: Negative for cough, chest tightness, shortness of breath and wheezing.   Cardiovascular: Negative for chest pain and palpitations.  Gastrointestinal: Positive for nausea and vomiting. Negative for abdominal pain, constipation and diarrhea.  Genitourinary: Negative for difficulty urinating, dysuria, frequency, hematuria and urgency.    Musculoskeletal: Positive for myalgias (generalized). Negative for back pain, neck pain and neck stiffness.  Skin: Negative for rash.  Neurological: Positive for seizures and headaches. Negative for facial asymmetry, weakness, light-headedness and numbness.  Psychiatric/Behavioral: Negative for agitation and confusion.  All other systems reviewed and are negative.   Physical Exam Updated Vital Signs BP 132/84   Pulse 75   Temp 97.5 F (36.4 C) (Oral)   Resp 17   LMP 03/08/2016   SpO2 97%   Physical Exam  Constitutional: She is oriented to person, place, and time. She appears well-developed and well-nourished. No distress.  HENT:  Head: Normocephalic and atraumatic.  Right Ear: External ear normal.  Left Ear: External ear normal.  Nose: Nose normal.  Mouth/Throat: Oropharynx is clear and moist. No oropharyngeal exudate.  Eyes: Conjunctivae and EOM are normal. Pupils are equal, round, and reactive to light.  Neck: Normal range of motion. Neck supple.  Cardiovascular: Normal rate, normal heart sounds and intact distal pulses.   No murmur heard. Pulmonary/Chest: Effort normal. No stridor. No respiratory distress. She has no wheezes. She exhibits no tenderness.  Abdominal: Soft. She exhibits no distension. There is no tenderness. There is no rebound.  Musculoskeletal: She exhibits no tenderness.  Neurological: She is alert and oriented to person, place, and time. She has normal reflexes. She is not disoriented. She displays no tremor. No cranial nerve deficit or sensory deficit. She exhibits normal muscle tone. Coordination and gait normal. GCS eye subscore is 4. GCS verbal subscore is 5. GCS motor subscore is 6.  Skin: Skin is warm. Capillary refill takes less than 2 seconds. No rash noted. She is not diaphoretic. No erythema.  Psychiatric: She has a normal mood and affect.  Nursing note and vitals reviewed.   ED Treatments / Results  DIAGNOSTIC STUDIES:  Oxygen Saturation is  97% on RA, normal by my interpretation.    COORDINATION OF CARE:  1:34 PM Discussed treatment plan with pt at bedside CXR, UA, blood work, CT head and pt agreed to plan.  Labs (all labs ordered are listed, but only abnormal results are displayed) Labs Reviewed  URINALYSIS, ROUTINE W REFLEX MICROSCOPIC - Abnormal; Notable for the following:       Result Value   Ketones, ur 5 (*)    All other components within normal limits  CBC WITH DIFFERENTIAL/PLATELET - Abnormal; Notable for the following:    WBC 11.1 (*)    Neutro Abs 9.7 (*)    All other components within normal limits  BASIC METABOLIC PANEL - Abnormal; Notable for the following:    Glucose, Bld 118 (*)    All other components within normal limits  VALPROIC ACID LEVEL  CBG MONITORING, ED  POC URINE PREG, ED    EKG  EKG Interpretation None       Radiology Dg Chest 2 View  Result Date: 02/08/2017 CLINICAL DATA:  Status post  seizure today. Headache, nausea and vomiting. EXAM: CHEST  2 VIEW COMPARISON:  PA and lateral chest 09/21/2015. FINDINGS: Lungs clear. Heart size normal. No pneumothorax or pleural effusion. No acute bony abnormality. IMPRESSION: No acute disease. Electronically Signed   By: Inge Rise M.D.   On: 02/08/2017 14:28   Ct Head Wo Contrast  Result Date: 02/08/2017 CLINICAL DATA:  51 year old female with seizure last night and continued headache. EXAM: CT HEAD WITHOUT CONTRAST TECHNIQUE: Contiguous axial images were obtained from the base of the skull through the vertex without intravenous contrast. COMPARISON:  08/20/2016 and prior CTs FINDINGS: Brain: No evidence of infarction, hemorrhage, hydrocephalus, extra-axial collection or mass lesion/mass effect. Vascular: No hyperdense vessel or unexpected calcification. Skull: Normal. Negative for fracture or focal lesion. Sinuses/Orbits: No acute finding. Other: None. IMPRESSION: Unremarkable noncontrast head CT. Electronically Signed   By: Margarette Canada M.D.    On: 02/08/2017 15:15    Procedures Procedures (including critical care time)  Medications Ordered in ED Medications  ondansetron (ZOFRAN-ODT) 4 MG disintegrating tablet (not administered)  ondansetron (ZOFRAN-ODT) disintegrating tablet 4 mg (4 mg Oral Given 02/08/17 1156)  sodium chloride 0.9 % bolus 1,000 mL (0 mLs Intravenous Stopped 02/08/17 1917)  prochlorperazine (COMPAZINE) injection 10 mg (10 mg Intravenous Given 02/08/17 1525)  diphenhydrAMINE (BENADRYL) injection 25 mg (25 mg Intravenous Given 02/08/17 1526)     Initial Impression / Assessment and Plan / ED Course  I have reviewed the triage vital signs and the nursing notes.  Pertinent labs & imaging results that were available during my care of the patient were reviewed by me and considered in my medical decision making (see chart for details).     SHARDEE DIEU is a 51 y.o. female with a PMHx of Seizures, HA, and Asthma, who presents to the Emergency Department complaining of a seizure onset during the night last night and headache this AM. Patient does report that she has been drinking coffee recently and has had decrease in her sleep regimen due to grandkids keeping her awake.  History and exam are seen above.   On exam, patient had no focal neurologic deficits. Patient lungs were clear abdomen was nontender. Patient did have photophobia on exam.   Based on patient's report of seizure overnight, visual workup to look for other things that could have caused it. Patient will have laboratory testing and imaging to look for occult infection. Given the continued severe headache with nausea today, patient will have CT of the head to look for intracranial abnormality.  Patient was given a headache cocktail during workup.  Patient's diagnostic results are seen above. Patient's laboratory testing grossly unremarkable aside from slight leukocytosis. Suspect this is from dehydration which the patient does report. Doubt infection at  this time given lack of infectious symptoms. Given lack of neck pain or neck stiffness and no nuchal rigidity, do not feel patient has meningitis.  Patient felt complete resolution of her headache after medications. CT head unremarkable and chest x-ray shows no pneumonia. Urinalysis did not show convincing evidence of UTI given lack of urinary symptoms.  Patient will follow-up with her PCP for further management of her epilepsy. Suspect her seizure overnight was secondary to decrease in her sleep with grandkids now staying in her bed as well as increasing coffee intake. Patient instructed to return if any new or worsening symptoms developed. Patient's Depakote level was normal and patient was instructed to continue taking her medications.  Patient had no other questions or concerns  and understood return precautions and plan of care. Patient discharged in good condition with improvement in symptoms. Patient's gait was normal on discharge.   Final Clinical Impressions(s) / ED Diagnoses   Final diagnoses:  Seizure ([place])  Other headache syndrome    New Prescriptions New Prescriptions   No medications on file   I personally performed the services described in this documentation, which was scribed in my presence. The recorded information has been reviewed and is accurate.  Clinical Impression: 1. Seizure ([place])   2. Other headache syndrome     Disposition: Discharge  Condition: Good  I have discussed the results, Dx and Tx plan with the pt(& family if present). He/she/they expressed understanding and agree(s) with the plan. Discharge instructions discussed at great length. Strict return precautions discussed and pt &/or family have verbalized understanding of the instructions. No further questions at time of discharge.    New Prescriptions   No medications on file    Follow Up: [place] [address] [place]  [address] [phone] Schedule an appointment as soon as possible for a visit    [place] [address] [id number] [place] [phone]  If symptoms worsen       Courtney Paris, MD 02/08/17 2012

## 2017-02-08 NOTE — Discharge Instructions (Signed)
Please continue taking your seizure medicine as normal. Please stay hydrated. Please try to sleep as well as he can. Please schedule a follow-up appointment with both your primary care physician and your neurologist. If any symptoms return or worsen, please return to the nearest emergency department.

## 2017-03-05 MED FILL — DIVALPROEX SOD ER 500 MG TA: 500 | 30 days supply | Qty: 60 | Fill #5

## 2017-04-09 MED FILL — DIVALPROEX SOD ER 500 MG TA: 500 | 30 days supply | Qty: 60 | Fill #6

## 2017-05-17 MED FILL — DIVALPROEX SOD ER 500 MG TA: 500 | 30 days supply | Qty: 60 | Fill #7

## 2017-05-23 ENCOUNTER — Emergency Department (HOSPITAL_COMMUNITY)
Admission: EM | Admit: 2017-05-23 | Discharge: 2017-05-23 | Disposition: A | Discharge status: Home / Self Care | Payer: No Typology Code available for payment source | Attending: Emergency Medicine | Admitting: Emergency Medicine

## 2017-05-23 DIAGNOSIS — J45909 Unspecified asthma, uncomplicated: Secondary | ICD-10-CM | POA: Insufficient documentation

## 2017-05-23 DIAGNOSIS — R569 Unspecified convulsions: Secondary | ICD-10-CM | POA: Insufficient documentation

## 2017-05-23 LAB — CBC WITH DIFFERENTIAL/PLATELET
BASOS ABS: 0 10*3/uL (ref 0.0–0.1)
BASOS PCT: 0 %
Eosinophils Absolute: 0 10*3/uL (ref 0.0–0.7)
Eosinophils Relative: 0 %
HEMATOCRIT: 38.9 % (ref 36.0–46.0)
HEMOGLOBIN: 12.8 g/dL (ref 12.0–15.0)
LYMPHS PCT: 8 %
Lymphs Abs: 0.9 10*3/uL (ref 0.7–4.0)
MCH: 28.1 pg (ref 26.0–34.0)
MCHC: 32.9 g/dL (ref 30.0–36.0)
MCV: 85.3 fL (ref 78.0–100.0)
Monocytes Absolute: 0.4 10*3/uL (ref 0.1–1.0)
Monocytes Relative: 4 %
NEUTROS PCT: 88 %
Neutro Abs: 9.5 10*3/uL — ABNORMAL HIGH (ref 1.7–7.7)
Platelets: 214 10*3/uL (ref 150–400)
RBC: 4.56 MIL/uL (ref 3.87–5.11)
RDW: 13.5 % (ref 11.5–15.5)
WBC: 10.8 10*3/uL — AB (ref 4.0–10.5)

## 2017-05-23 LAB — COMPREHENSIVE METABOLIC PANEL
ALBUMIN: 3.8 g/dL (ref 3.5–5.0)
ALT: 10 U/L — AB (ref 14–54)
AST: 22 U/L (ref 15–41)
Alkaline Phosphatase: 54 U/L (ref 38–126)
Anion gap: 10 (ref 5–15)
BILIRUBIN TOTAL: 0.4 mg/dL (ref 0.3–1.2)
BUN: 11 mg/dL (ref 6–20)
CO2: 27 mmol/L (ref 22–32)
CREATININE: 0.72 mg/dL (ref 0.44–1.00)
Calcium: 9.4 mg/dL (ref 8.9–10.3)
Chloride: 102 mmol/L (ref 101–111)
GFR calc Af Amer: >60 mL/min (ref >60)
GLUCOSE: 98 mg/dL (ref 65–99)
Potassium: 3.7 mmol/L (ref 3.5–5.1)
Sodium: 139 mmol/L (ref 135–145)
TOTAL PROTEIN: 7.4 g/dL (ref 6.5–8.1)

## 2017-05-23 LAB — URINALYSIS, ROUTINE W REFLEX MICROSCOPIC
BILIRUBIN URINE: NEGATIVE
Bacteria, UA: NONE SEEN
Glucose, UA: NEGATIVE mg/dL
Hgb urine dipstick: NEGATIVE
KETONES UR: 5 mg/dL — AB
LEUKOCYTES UA: NEGATIVE
Nitrite: NEGATIVE
PH: 7 (ref 5.0–8.0)
Protein, ur: 30 mg/dL — AB
SPECIFIC GRAVITY, URINE: 1.02 (ref 1.005–1.030)

## 2017-05-23 LAB — LIPASE, BLOOD: LIPASE: 24 U/L (ref 11–51)

## 2017-05-23 MED ORDER — LACTATED RINGERS IV BOLUS (SEPSIS)
1000.0000 mL | Freq: Once | INTRAVENOUS | Status: AC
  Administered 2017-05-23: 1000 mL via INTRAVENOUS

## 2017-05-23 MED ORDER — DIVALPROEX SODIUM 500 MG PO DR TAB
500.0000 mg | DELAYED_RELEASE_TABLET | Freq: Once | ORAL | Status: AC
  Administered 2017-05-23: 500 mg via ORAL
  Filled 2017-05-23: qty 1

## 2017-05-23 MED ORDER — ONDANSETRON HCL 4 MG/2ML IJ SOLN
4.0000 mg | Freq: Once | INTRAMUSCULAR | Status: AC
  Administered 2017-05-23: 4 mg via INTRAVENOUS
  Filled 2017-05-23: qty 2

## 2017-05-23 MED ORDER — ACETAMINOPHEN 325 MG PO TABS
650.0000 mg | ORAL_TABLET | Freq: Once | ORAL | Status: AC
  Administered 2017-05-23: 650 mg via ORAL
  Filled 2017-05-23: qty 2

## 2017-05-23 NOTE — ED Triage Notes (Signed)
Per EMS, pt is coming from her daughter house after experiencing a seizure. Pt daughter reports coming in her room and pt had been incontinent and post ictal. EMS reports that pt was post ictal for approx. 15 min. Pt reports taking depakote. Pt complains of a headache and belly pain.

## 2017-05-23 NOTE — ED Notes (Signed)
Bed: WA15 Expected date:  Expected time:  Means of arrival:  Comments: EMS- seizure 

## 2017-05-23 NOTE — ED Notes (Signed)
ATTEMPTED BLOOD DRAW X2 UNSUCCESSFUL 

## 2017-05-23 NOTE — ED Notes (Signed)
Pt ambulatory and independent at discharge.  Verbalized understanding of discharge instructions 

## 2017-05-23 NOTE — ED Provider Notes (Signed)
[facility] DE[facility] Provider Note   CSN: [id number] Arrival date & time: 05/23/17  0825     History   Chief Complaint Chief Complaint  Patient presents with  . Seizures    HPI [name] [name] is a 51 y.o. female.   Seizures   This is a recurrent problem. The current episode started 1 to 2 hours ago. The problem has been gradually improving. Number of times: unsure. Duration: unsure. Associated symptoms include vomiting. Characteristics include bladder incontinence and loss of consciousness. The episode was not witnessed. There was no sensation of an aura present. The seizures did not continue in the ED. Possible causes include missed seizure meds.    Past Medical History:  Diagnosis Date  . Asthma   . Headache(784.0)   . Seizures [facility])     Patient Active Problem List   Diagnosis Date Noted  . Chronic migraine 09/07/2016  . Seizures ([place]) 08/21/2016  . Seizure ([place]) 08/20/2016  . Asthma 08/20/2016  . Nausea and vomiting 08/20/2016  . Elevated blood pressure 08/20/2016  . DENTAL PAIN 02/12/2009  . DVT, HX OF 01/20/2008  . FIBROIDS, UTERUS 01/16/2008  . COUGH 01/16/2008  . EMBOLISM/THROMBOSIS, DEEP VSL LWR EXTRM NOS 06/03/2007    Past Surgical History:  Procedure Laterality Date  . HERNIA REPAIR    . UTERINE FIBROID SURGERY      OB History    Gravida Para Term Preterm AB Living   3       1 2    SAB TAB Ectopic Multiple Live Births   1 0             Home Medications    Prior to Admission medications   Medication Sig Start Date End Date Taking? Authorizing Provider  divalproex (DEPAKOTE ER) 500 MG 24 hr tablet Take 2 tablets (1,000 mg total) by mouth daily. Patient taking differently: Take 500 mg by mouth 2 (two) times daily.  09/07/16  Yes Marcial Pacas, MD  SUMAtriptan (IMITREX) 50 MG tablet Take 1 tablet (50 mg total) by mouth every 2 (two) hours as needed for migraine (at the onset of migraine). May repeat in 2 hours if headache persists or recurs.  09/07/16  Yes Marcial Pacas, MD    Family History Family History  Problem Relation Age of Onset  . Heart disease Mother   . Migraines Mother   . Heart attack Father     Social History Social History  Substance Use Topics  . Smoking status: Never Smoker  . Smokeless tobacco: Never Used  . Alcohol use No     Allergies   Dilantin [phenytoin] and Aspirin   Review of Systems Review of Systems  Gastrointestinal: Positive for vomiting.  Endocrine: Positive for polyuria.  Genitourinary: Positive for bladder incontinence.  Neurological: Positive for seizures and loss of consciousness.  All other systems reviewed and are negative.    Physical Exam Updated Vital Signs BP (!) 159/109 (BP Location: Left Arm)   Pulse 76   Temp 97.8 F (36.6 C) (Oral)   Resp 17   LMP 03/08/2016   SpO2 91%   Physical Exam  Constitutional: She is oriented to person, place, and time. She appears well-developed and well-nourished.  HENT:  Head: Normocephalic and atraumatic.  Eyes: Conjunctivae and EOM are normal.  Neck: Normal range of motion.  Cardiovascular: Normal rate and regular rhythm.   Pulmonary/Chest: No stridor. No respiratory distress.  Abdominal: She exhibits no distension. There is tenderness (suprapubic). There is no guarding.  Neurological: She is alert and oriented to person, place, and time.  Skin: Skin is warm and dry. No erythema. No pallor.  Nursing note and vitals reviewed.    ED Treatments / Results  Labs (all labs ordered are listed, but only abnormal results are displayed) Labs Reviewed  CBC WITH DIFFERENTIAL/PLATELET - Abnormal; Notable for the following:       Result Value   WBC 10.8 (*)    Neutro Abs 9.5 (*)    All other components within normal limits  COMPREHENSIVE METABOLIC PANEL - Abnormal; Notable for the following:    ALT 10 (*)    All other components within normal limits  URINALYSIS, ROUTINE W REFLEX MICROSCOPIC - Abnormal; Notable for the following:     Ketones, ur 5 (*)    Protein, ur 30 (*)    Squamous Epithelial / LPF 0-5 (*)    All other components within normal limits  LIPASE, BLOOD    EKG  EKG Interpretation None       Radiology No results found.  Procedures Procedures (including critical care time)  Medications Ordered in ED Medications  divalproex (DEPAKOTE) DR tablet 500 mg (not administered)  lactated ringers bolus 1,000 mL (1,000 mLs Intravenous New Bag/Given 05/23/17 0910)  acetaminophen (TYLENOL) tablet 650 mg (650 mg Oral Given 05/23/17 0910)  ondansetron (ZOFRAN) injection 4 mg (4 mg Intravenous Given 05/23/17 0910)     Initial Impression / Assessment and Plan / ED Course  I have reviewed the triage vital signs and the nursing notes.  Pertinent labs & imaging results that were available during my care of the patient were reviewed by me and considered in my medical decision making (see chart for details).      Missed depakote because of vomiting last nght. Likely seizure today when daughter saw her have decreased LOC, incontinence and post ictal state. H/o same, no aura.  Will workup for vomiting/suprapubic ttp and observe for return to [place]. If labs ok will give depakote and reeval.   Improved. Tolerating PO.  Ambulating without difficulty. Workup negative. Likely breakthrough seizure. Will continue depakote and follow up with PCP/neurologist.   Final Clinical Impressions(s) / ED Diagnoses   Final diagnoses:  Seizure [facility])    New Prescriptions New Prescriptions   No medications on file     Lara Palinkas, Corene Cornea, MD 05/23/17 1415

## 2017-06-01 ENCOUNTER — Ambulatory Visit: Payer: Self-pay | Attending: Internal Medicine | Admitting: Physician Assistant

## 2017-06-01 VITALS — BP 150/90 | HR 60 | Temp 97.8°F | Ht 63.0 in | Wt 207.0 lb

## 2017-06-01 DIAGNOSIS — M7989 Other specified soft tissue disorders: Secondary | ICD-10-CM | POA: Insufficient documentation

## 2017-06-01 DIAGNOSIS — R030 Elevated blood-pressure reading, without diagnosis of hypertension: Secondary | ICD-10-CM | POA: Insufficient documentation

## 2017-06-01 DIAGNOSIS — Z79899 Other long term (current) drug therapy: Secondary | ICD-10-CM | POA: Insufficient documentation

## 2017-06-01 DIAGNOSIS — G40909 Epilepsy, unspecified, not intractable, without status epilepticus: Secondary | ICD-10-CM | POA: Insufficient documentation

## 2017-06-01 DIAGNOSIS — I8392 Asymptomatic varicose veins of left lower extremity: Secondary | ICD-10-CM | POA: Insufficient documentation

## 2017-06-01 DIAGNOSIS — E049 Nontoxic goiter, unspecified: Secondary | ICD-10-CM | POA: Insufficient documentation

## 2017-06-01 DIAGNOSIS — M79605 Pain in left leg: Secondary | ICD-10-CM | POA: Insufficient documentation

## 2017-06-01 DIAGNOSIS — I8390 Asymptomatic varicose veins of unspecified lower extremity: Secondary | ICD-10-CM

## 2017-06-01 DIAGNOSIS — I803 Phlebitis and thrombophlebitis of lower extremities, unspecified: Secondary | ICD-10-CM | POA: Insufficient documentation

## 2017-06-01 DIAGNOSIS — R569 Unspecified convulsions: Secondary | ICD-10-CM

## 2017-06-01 NOTE — Patient Instructions (Signed)
ACE wrap before exercise the left leg Wear support hose/TED hose daily Cool compresses 2 times per day

## 2017-06-01 NOTE — Progress Notes (Signed)
Chief Complaint: ED follow up  Subjective: This is a 51 year old female with a past medical history migraines, asthma and seizure disorder. She last reported to the emergency department last month. That was her fourth emergency department visit for the same. She is had seizures. She has been cared for in the past by Dr. Krista Blue, but this was costing her $50 each time, she does not have the financial resources. She has been able to get Depakote 1000 mg daily field from our office it appears. Usually her seizures are followed by bladder incontinence and loss of consciousness. She does have the post ictal state. In the emergency department her blood pressure was 159/109. Her labs were okay. She recently in March had a CT scan of her head which was normal so imaging was not repeated. At some point Dr. Krista Blue had wanted her to have an MRI of her brain but this was not done secondary to cost.  Since release from the emergency department she's not had a recurrence of seizures. We were able to get her some refills on her medications. She has been compliant.  Today she's complaining of some left lower extremity pain. She also has some neck swelling.   ROS:  GEN: denies fever or chills, denies change in weight Skin: denies lesions or rashes HEENT: denies headache, earache, epistaxis, sore throat, or neck pain LUNGS: denies SHOB, dyspnea, PND, orthopnea CV: denies CP or palpitations ABD: denies abd pain, N or V EXT: denies muscle spasms or swelling; +pain in left lower ext, no weakness NEURO: denies numbness or tingling, + sz, stroke or TIA   Objective:  Vitals:   06/01/17 1028  BP: (!) 150/90  Pulse: 60  Temp: 97.8 F (36.6 C)  TempSrc: Oral  SpO2: 100%  Weight: 207 lb (93.9 kg)  Height: 5\' 3"  (1.6 m)    Physical Exam:  General: in no acute distress. HEENT: no pallor, no icterus, moist oral mucosa, no JVD, no lymphadenopathy-protruding tissue midline, mobile with no lumps Heart: Normal  s1 &s2   Regular rate and rhythm, without murmurs, rubs, gallops. Lungs: Clear to auscultation bilaterally. Abdomen: Soft, nontender, nondistended, positive bowel sounds. Extremities: No clubbing cyanosis or edema with positive pedal pulses; left leg with severe varicosities-tender to touch Neuro: Alert, awake, oriented x3, nonfocal.    Medications: Prior to Admission medications   Medication Sig Start Date End Date Taking? Authorizing Provider  divalproex (DEPAKOTE ER) 500 MG 24 hr tablet Take 2 tablets (1,000 mg total) by mouth daily. Patient taking differently: Take 500 mg by mouth 2 (two) times daily.  09/07/16  Yes Marcial Pacas, MD  SUMAtriptan (IMITREX) 50 MG tablet Take 1 tablet (50 mg total) by mouth every 2 (two) hours as needed for migraine (at the onset of migraine). May repeat in 2 hours if headache persists or recurs. 09/07/16  Yes Marcial Pacas, MD    Assessment: 1. Seizure D/O 2. Varicose Veins/phlebitis 3. Elevated blood pressure 4. Goiter  Plan: Cont Depakote, ok on refills Referral to Neuro  Cool compresses, elevation ACE wrap/TED hose Referral to vascular once insurance/orange card in place  Re check BP in 1 week, may need meds DASH diet  Check TSH  Follow up:1 week  The patient was given clear instructions to go to ER or return to medical center if symptoms don't improve, worsen or new problems develop. The patient verbalized understanding. The patient was told to call to get lab results if they haven't heard anything in the next  week.   This note has been created with Surveyor, quantity. Any transcriptional errors are unintentional.   Zettie Pho, PA-C 06/01/2017, 10:54 AM

## 2017-06-02 LAB — TSH: TSH: 1.13 u[IU]/mL (ref 0.450–4.500)

## 2017-06-15 ENCOUNTER — Ambulatory Visit: Payer: Self-pay | Admitting: Family Medicine

## 2017-06-19 MED FILL — DIVALPROEX SOD ER 500 MG TA: 500 | 30 days supply | Qty: 60 | Fill #8

## 2017-06-20 ENCOUNTER — Ambulatory Visit: Payer: Self-pay | Attending: Family Medicine | Admitting: Family Medicine

## 2017-06-20 ENCOUNTER — Encounter: Payer: Self-pay | Admitting: Family Medicine

## 2017-06-20 VITALS — BP 137/92 | HR 69 | Temp 97.9°F | Resp 18 | Ht 63.0 in | Wt 204.4 lb

## 2017-06-20 DIAGNOSIS — E049 Nontoxic goiter, unspecified: Secondary | ICD-10-CM | POA: Insufficient documentation

## 2017-06-20 DIAGNOSIS — J4520 Mild intermittent asthma, uncomplicated: Secondary | ICD-10-CM

## 2017-06-20 DIAGNOSIS — Z8669 Personal history of other diseases of the nervous system and sense organs: Secondary | ICD-10-CM

## 2017-06-20 DIAGNOSIS — I10 Essential (primary) hypertension: Secondary | ICD-10-CM | POA: Insufficient documentation

## 2017-06-20 DIAGNOSIS — R32 Unspecified urinary incontinence: Secondary | ICD-10-CM | POA: Insufficient documentation

## 2017-06-20 DIAGNOSIS — Z23 Encounter for immunization: Secondary | ICD-10-CM

## 2017-06-20 DIAGNOSIS — Z79899 Other long term (current) drug therapy: Secondary | ICD-10-CM | POA: Insufficient documentation

## 2017-06-20 DIAGNOSIS — H538 Other visual disturbances: Secondary | ICD-10-CM | POA: Insufficient documentation

## 2017-06-20 DIAGNOSIS — G40909 Epilepsy, unspecified, not intractable, without status epilepticus: Secondary | ICD-10-CM | POA: Insufficient documentation

## 2017-06-20 DIAGNOSIS — G43909 Migraine, unspecified, not intractable, without status migrainosus: Secondary | ICD-10-CM | POA: Insufficient documentation

## 2017-06-20 DIAGNOSIS — J45909 Unspecified asthma, uncomplicated: Secondary | ICD-10-CM | POA: Insufficient documentation

## 2017-06-20 DIAGNOSIS — Z0000 Encounter for general adult medical examination without abnormal findings: Secondary | ICD-10-CM

## 2017-06-20 DIAGNOSIS — F1290 Cannabis use, unspecified, uncomplicated: Secondary | ICD-10-CM | POA: Insufficient documentation

## 2017-06-20 LAB — POCT UA - MICROALBUMIN
Albumin/Creatinine Ratio, Urine, POC: <30
Creatinine, POC: 200 mg/dL
Microalbumin Ur, POC: 10 mg/L

## 2017-06-20 MED ORDER — AMLODIPINE BESYLATE 5 MG PO TABS: 5.0000 mg | ORAL_TABLET | Freq: Every day | ORAL | 2 refills | Status: DC

## 2017-06-20 MED ORDER — ALBUTEROL SULFATE HFA 108 (90 BASE) MCG/ACT IN AERS: 2.0000 | INHALATION_SPRAY | Freq: Four times a day (QID) | RESPIRATORY_TRACT | 2 refills | Status: DC | PRN

## 2017-06-20 MED ORDER — ACETAMINOPHEN 500 MG PO TABS: 1000.0000 mg | ORAL_TABLET | Freq: Three times a day (TID) | ORAL | 0 refills | Status: DC | PRN

## 2017-06-20 MED FILL — AMLODIPINE BESYLATE 5 MG TA: 5 | 30 days supply | Qty: 30 | Fill #0

## 2017-06-20 MED FILL — !VENTOLIN HFA INHALER: 108 (90 BAS | 16 days supply | Qty: 18 | Fill #0

## 2017-06-20 NOTE — Progress Notes (Signed)
Subjective:  Patient ID: [facility] [name], female    DOB: 09/06/1966  Age: 51 y.o. MRN: [id number]  CC: Hypertension   HPI [name] [name] presents for here for follow-up of elevated blood pressure. She is not exercising and is adherent to low salt diet.  She does not check BP at home. Cardiac symptoms none. Patient denies chest pain, claudication, dyspnea and palpitations.  Cardiovascular risk factors: hypertension, sedentary lifestyle and smoking/ tobacco exposure. Use of agents associated with hypertension: none. History of target organ damage: none. History of seizure disorder. She reports last episode of seizure was 05/23/17.  She reports seizures started in adulthood.  She has had multiple episodes within the last 7 months. She does not know episode duration. She reports seizures usually occur at night. Premonitory symptoms include confusion. She does not know what seizures appear to be precipitated by. Symptoms associated with seizures are incontinence of urine, incontinence of stool, falling, and confusion. Patient does not have a history of head trauma.  She does not have a history of CVA. She does not have a history of alcohol abuse. She does report marijuana use. Last use was two days ago.She does not have a history of developmental delay.Work up thus far has been: CT scan of head. Treatment thus far has been: valproic acid (Depakote). History of asthma. She denies any dyspnea, wheezing and cough. She denies any daily rescue inhaler use. History of migraine headaches.The headaches are usually throbbing and are bilateral, temporal in location. Home treatment has included acetaminophen with adequate relief. She reports taking sumatriptan but states she discontinued because " it was too strong". History of goiter. Reports 2 year history. Denies any history of thyroid disease. She denies tender neck / sore throat. Prior studies include TSH.      Outpatient Medications Prior to Visit  Medication  Sig Dispense Refill  . divalproex (DEPAKOTE ER) 500 MG 24 hr tablet Take 2 tablets (1,000 mg total) by mouth daily. (Patient taking differently: Take 500 mg by mouth 2 (two) times daily. ) 60 tablet 11  . SUMAtriptan (IMITREX) 50 MG tablet Take 1 tablet (50 mg total) by mouth every 2 (two) hours as needed for migraine (at the onset of migraine). May repeat in 2 hours if headache persists or recurs. 10 tablet 6   No facility-administered medications prior to visit.     ROS Review of Systems  Constitutional: Negative.   HENT:       Neck enlargement  Eyes: Positive for visual disturbance (blurred).  Respiratory: Negative.   Cardiovascular: Negative.   Gastrointestinal: Negative.   Musculoskeletal: Negative.   Neurological: Positive for seizures (history of seizure disorder).    Objective:  BP (!) 137/92 (BP Location: Left Arm, Patient Position: Sitting, Cuff Size: Normal)   Pulse 69   Temp 97.9 F (36.6 C) (Oral)   Resp 18   Ht 5\' 3"  (1.6 m)   Wt 204 lb 6.4 oz (92.7 kg)   LMP 03/08/2016   SpO2 98%   BMI 36.21 kg/m   BP/Weight 06/20/2017 06/01/2017 0/11/7492  Systolic BP 496 759 163  Diastolic BP 92 90 81  Wt. (Lbs) 204.4 207 204  BMI 36.21 36.67 36.14   Physical Exam  Constitutional: She is oriented to person, place, and time. She appears well-developed and well-nourished.  HENT:  Head: Normocephalic and atraumatic.  Right Ear: External ear normal.  Left Ear: External ear normal.  Nose: Nose normal.  Mouth/Throat: Oropharynx is clear and moist.  Eyes: Pupils are equal, round, and reactive to light. Conjunctivae are normal.  Neck: No JVD present. Thyromegaly present.  Cardiovascular: Normal rate, regular rhythm, normal heart sounds and intact distal pulses.   Pulmonary/Chest: Effort normal and breath sounds normal.  Abdominal: Soft. Bowel sounds are normal. There is no tenderness.  Neurological: She is alert and oriented to person, place, and time.  Skin: Skin is warm  and dry.  Nursing note and vitals reviewed.   Assessment & Plan:   Problem List Items Addressed This Visit      Respiratory   Asthma   Relevant Medications   albuterol (PROVENTIL HFA;VENTOLIN HFA) 108 (90 Base) MCG/ACT inhaler    Other Visit Diagnoses    Seizure disorder ([place])    -  Primary   Relevant Orders   Ambulatory referral to Neurology   Essential hypertension       Relevant Medications   amLODipine (NORVASC) 5 MG tablet   Other Relevant Orders   POCT UA - Microalbumin (Completed)   Goiter       Patient declines labs today she is request future labs.   Relevant Orders   Thyroid Peroxidase Antibody   Healthcare maintenance       Relevant Orders   Tdap vaccine greater than or equal to 7yo IM (Completed)   HIV antibody (with reflex)   History of migraine       Relevant Medications   acetaminophen (TYLENOL) 500 MG tablet   Blurred vision, bilateral       Vision acuity screen   Relevant Orders   Ambulatory referral to Ophthalmology      Meds ordered this encounter  Medications  . amLODipine (NORVASC) 5 MG tablet    Sig: Take 1 tablet (5 mg total) by mouth daily.    Dispense:  30 tablet    Refill:  2    Order Specific Question:   Supervising Provider    Answer:   Tresa Garter [id number]  . albuterol (PROVENTIL HFA;VENTOLIN HFA) 108 (90 Base) MCG/ACT inhaler    Sig: Inhale 2 puffs into the lungs every 6 (six) hours as needed for wheezing or shortness of breath.    Dispense:  1 Inhaler    Refill:  2    Order Specific Question:   Supervising Provider    Answer:   Tresa Garter [id number]  . acetaminophen (TYLENOL) 500 MG tablet    Sig: Take 2 tablets (1,000 mg total) by mouth every 8 (eight) hours as needed.    Dispense:  30 tablet    Refill:  0    Order Specific Question:   Supervising Provider    Answer:   Tresa Garter [id number]    Follow-up: Return in about 2 weeks (around 07/04/2017) for HTN/ PAP/Labs.   Alfonse Spruce  FNP

## 2017-06-20 NOTE — Progress Notes (Signed)
Patient is here for HTN  

## 2017-06-20 NOTE — Patient Instructions (Addendum)
Apply for orange card. Follow up for labs.    DASH Eating Plan DASH stands for "Dietary Approaches to Stop Hypertension." The DASH eating plan is a healthy eating plan that has been shown to reduce high blood pressure (hypertension). It may also reduce your risk for type 2 diabetes, heart disease, and stroke. The DASH eating plan may also help with weight loss. What are tips for following this plan? General guidelines  Avoid eating more than 2,300 mg (milligrams) of salt (sodium) a day. If you have hypertension, you may need to reduce your sodium intake to 1,500 mg a day.  Limit alcohol intake to no more than 1 drink a day for nonpregnant women and 2 drinks a day for men. One drink equals 12 oz of beer, 5 oz of wine, or 1 oz of hard liquor.  Work with your health care provider to maintain a healthy body weight or to lose weight. Ask what an ideal weight is for you.  Get at least 30 minutes of exercise that causes your heart to beat faster (aerobic exercise) most days of the week. Activities may include walking, swimming, or biking.  Work with your health care provider or diet and nutrition specialist (dietitian) to adjust your eating plan to your individual calorie needs. Reading food labels  Check food labels for the amount of sodium per serving. Choose foods with less than 5 percent of the Daily Value of sodium. Generally, foods with less than 300 mg of sodium per serving fit into this eating plan.  To find whole grains, look for the word "whole" as the first word in the ingredient list. Shopping  Buy products labeled as "low-sodium" or "no salt added."  Buy fresh foods. Avoid canned foods and premade or frozen meals. Cooking  Avoid adding salt when cooking. Use salt-free seasonings or herbs instead of table salt or sea salt. Check with your health care provider or pharmacist before using salt substitutes.  Do not fry foods. Cook foods using healthy methods such as baking,  boiling, grilling, and broiling instead.  Cook with heart-healthy oils, such as olive, canola, soybean, or sunflower oil. Meal planning   Eat a balanced diet that includes: ? 5 or more servings of fruits and vegetables each day. At each meal, try to fill half of your plate with fruits and vegetables. ? Up to 6-8 servings of whole grains each day. ? Less than 6 oz of lean meat, poultry, or fish each day. A 3-oz serving of meat is about the same size as a deck of cards. One egg equals 1 oz. ? 2 servings of low-fat dairy each day. ? A serving of nuts, seeds, or beans 5 times each week. ? Heart-healthy fats. Healthy fats called Omega-3 fatty acids are found in foods such as flaxseeds and coldwater fish, like sardines, salmon, and mackerel.  Limit how much you eat of the following: ? Canned or prepackaged foods. ? Food that is high in trans fat, such as fried foods. ? Food that is high in saturated fat, such as fatty meat. ? Sweets, desserts, sugary drinks, and other foods with added sugar. ? Full-fat dairy products.  Do not salt foods before eating.  Try to eat at least 2 vegetarian meals each week.  Eat more home-cooked food and less restaurant, buffet, and fast food.  When eating at a restaurant, ask that your food be prepared with less salt or no salt, if possible. What foods are recommended? The items listed may  not be a complete list. Talk with your dietitian about what dietary choices are best for you. Grains Whole-grain or whole-wheat bread. Whole-grain or whole-wheat pasta. Brown rice. [name]. Bulgur. Whole-grain and low-sodium cereals. Pita bread. Low-fat, low-sodium crackers. Whole-wheat flour tortillas. Vegetables Fresh or frozen vegetables (raw, steamed, roasted, or grilled). Low-sodium or reduced-sodium tomato and vegetable juice. Low-sodium or reduced-sodium tomato sauce and tomato paste. Low-sodium or reduced-sodium canned vegetables. Fruits All fresh, dried, or  frozen fruit. Canned fruit in natural juice (without added sugar). Meat and other protein foods Skinless chicken or [place]. Ground chicken or [place]. Pork with fat trimmed off. Fish and seafood. Egg whites. Dried beans, peas, or lentils. Unsalted nuts, nut butters, and seeds. Unsalted canned beans. Lean cuts of beef with fat trimmed off. Low-sodium, lean deli meat. Dairy Low-fat (1%) or fat-free (skim) milk. Fat-free, low-fat, or reduced-fat cheeses. Nonfat, low-sodium ricotta or cottage cheese. Low-fat or nonfat yogurt. Low-fat, low-sodium cheese. Fats and oils Soft margarine without trans fats. Vegetable oil. Low-fat, reduced-fat, or light mayonnaise and salad dressings (reduced-sodium). Canola, safflower, olive, soybean, and sunflower oils. Avocado. Seasoning and other foods Herbs. Spices. Seasoning mixes without salt. Unsalted popcorn and pretzels. Fat-free sweets. What foods are not recommended? The items listed may not be a complete list. Talk with your dietitian about what dietary choices are best for you. Grains Baked goods made with fat, such as croissants, muffins, or some breads. Dry pasta or rice meal packs. Vegetables Creamed or fried vegetables. Vegetables in a cheese sauce. Regular canned vegetables (not low-sodium or reduced-sodium). Regular canned tomato sauce and paste (not low-sodium or reduced-sodium). Regular tomato and vegetable juice (not low-sodium or reduced-sodium). Angie Fava. Olives. Fruits Canned fruit in a light or heavy syrup. Fried fruit. Fruit in cream or butter sauce. Meat and other protein foods Fatty cuts of meat. Ribs. Fried meat. Berniece Salines. Sausage. Bologna and other processed lunch meats. Salami. Fatback. Hotdogs. Bratwurst. Salted nuts and seeds. Canned beans with added salt. Canned or smoked fish. Whole eggs or egg yolks. Chicken or [place] with skin. Dairy Whole or 2% milk, cream, and half-and-half. Whole or full-fat cream cheese. Whole-fat or sweetened yogurt.  Full-fat cheese. Nondairy creamers. Whipped toppings. Processed cheese and cheese spreads. Fats and oils Butter. Stick margarine. Lard. Shortening. Ghee. Bacon fat. Tropical oils, such as coconut, palm kernel, or palm oil. Seasoning and other foods Salted popcorn and pretzels. Onion salt, garlic salt, seasoned salt, table salt, and sea salt. Worcestershire sauce. Tartar sauce. Barbecue sauce. Teriyaki sauce. Soy sauce, including reduced-sodium. Steak sauce. Canned and packaged gravies. Fish sauce. Oyster sauce. Cocktail sauce. Horseradish that you find on the shelf. Ketchup. Mustard. Meat flavorings and tenderizers. Bouillon cubes. Hot sauce and Tabasco sauce. Premade or packaged marinades. Premade or packaged taco seasonings. Relishes. Regular salad dressings. Where to find more information:  National Heart, Lung, and [place]: https://wilson-eaton.com/  American Heart Association: www.heart.org Summary  The DASH eating plan is a healthy eating plan that has been shown to reduce high blood pressure (hypertension). It may also reduce your risk for type 2 diabetes, heart disease, and stroke.  With the DASH eating plan, you should limit salt (sodium) intake to 2,300 mg a day. If you have hypertension, you may need to reduce your sodium intake to 1,500 mg a day.  When on the DASH eating plan, aim to eat more fresh fruits and vegetables, whole grains, lean proteins, low-fat dairy, and heart-healthy fats.  Work with your health care provider or diet and  nutrition specialist (dietitian) to adjust your eating plan to your individual calorie needs. This information is not intended to replace advice given to you by your health care provider. Make sure you discuss any questions you have with your health care provider. Document Released: 11/02/2011 Document Revised: 11/06/2016 Document Reviewed: 11/06/2016 Elsevier Interactive Patient Education  2017 [employer].

## 2017-07-04 ENCOUNTER — Ambulatory Visit: Payer: Self-pay | Admitting: Family Medicine

## 2017-07-23 MED FILL — DIVALPROEX SOD ER 500 MG TA: 500 | 30 days supply | Qty: 60 | Fill #9

## 2017-07-23 MED FILL — AMLODIPINE BESYLATE 5 MG TA: 5 | 30 days supply | Qty: 30 | Fill #1

## 2017-08-29 MED FILL — DIVALPROEX SOD ER 500 MG TA: 500 | 30 days supply | Qty: 60 | Fill #10

## 2017-09-09 ENCOUNTER — Encounter (HOSPITAL_BASED_OUTPATIENT_CLINIC_OR_DEPARTMENT_OTHER): Payer: Self-pay | Admitting: Emergency Medicine

## 2017-09-09 ENCOUNTER — Emergency Department (HOSPITAL_BASED_OUTPATIENT_CLINIC_OR_DEPARTMENT_OTHER)
Admission: EM | Admit: 2017-09-09 | Discharge: 2017-09-09 | Disposition: A | Discharge status: Home / Self Care | Payer: Self-pay | Attending: Emergency Medicine | Admitting: Emergency Medicine

## 2017-09-09 DIAGNOSIS — Z79899 Other long term (current) drug therapy: Secondary | ICD-10-CM | POA: Insufficient documentation

## 2017-09-09 DIAGNOSIS — J45909 Unspecified asthma, uncomplicated: Secondary | ICD-10-CM | POA: Insufficient documentation

## 2017-09-09 DIAGNOSIS — G43709 Chronic migraine without aura, not intractable, without status migrainosus: Secondary | ICD-10-CM | POA: Insufficient documentation

## 2017-09-09 DIAGNOSIS — I10 Essential (primary) hypertension: Secondary | ICD-10-CM | POA: Insufficient documentation

## 2017-09-09 DIAGNOSIS — G43909 Migraine, unspecified, not intractable, without status migrainosus: Secondary | ICD-10-CM

## 2017-09-09 HISTORY — DX: Essential (primary) hypertension: I10

## 2017-09-09 MED ORDER — SODIUM CHLORIDE 0.9 % IV BOLUS (SEPSIS)
500.0000 mL | Freq: Once | INTRAVENOUS | Status: AC
  Administered 2017-09-09: 500 mL via INTRAVENOUS

## 2017-09-09 MED ORDER — PROMETHAZINE HCL 25 MG/ML IJ SOLN
12.5000 mg | Freq: Once | INTRAMUSCULAR | Status: AC
  Administered 2017-09-09: 12.5 mg via INTRAVENOUS
  Filled 2017-09-09: qty 1

## 2017-09-09 MED ORDER — SUMATRIPTAN SUCCINATE 50 MG PO TABS: 50.0000 mg | ORAL_TABLET | ORAL | 0 refills | Status: DC | PRN

## 2017-09-09 NOTE — ED Triage Notes (Signed)
HA and vomiting today. Hx of migraines, ran out of medication for that.

## 2017-09-09 NOTE — ED Notes (Signed)
Pt given d/c instructions as per chart. Rx x1. Verbalizes understanding. No questions. Instructed not to drive.

## 2017-09-09 NOTE — ED Provider Notes (Signed)
[place] DEPT [facility] Provider Note   CSN: [id number] Arrival date & time: 09/09/17  1758     History   Chief Complaint Chief Complaint  Patient presents with  . Headache  . Emesis    HPI [name] Tammy [name] is a 51 y.o. female.  HPI Patient presents with a one dayhistory of headache nausea and vomiting. His history of migraines and states this is typical for her.previously on Imitrex for it but has been out of the medicine. Throbbing frontal headache. Does have some photophobia. No fevers. No diarrhea. States she's been on her other medications. Reportedly has an allergy to aspirin Denies possibility of pregnancy. No localizing numbness or weakness. Past Medical History:  Diagnosis Date  . Asthma   . Headache(784.0)   . Hypertension   . Seizures [facility])     Patient Active Problem List   Diagnosis Date Noted  . Chronic migraine 09/07/2016  . Seizures ([place]) 08/21/2016  . Seizure ([place]) 08/20/2016  . Asthma 08/20/2016  . Nausea and vomiting 08/20/2016  . Elevated blood pressure 08/20/2016  . DENTAL PAIN 02/12/2009  . DVT, HX OF 01/20/2008  . FIBROIDS, UTERUS 01/16/2008  . COUGH 01/16/2008  . EMBOLISM/THROMBOSIS, DEEP VSL LWR EXTRM NOS 06/03/2007    Past Surgical History:  Procedure Laterality Date  . HERNIA REPAIR    . UTERINE FIBROID SURGERY      OB History    Gravida Para Term Preterm AB Living   3       1 2    SAB TAB Ectopic Multiple Live Births   1 0             Home Medications    Prior to Admission medications   Medication Sig Start Date End Date Taking? Authorizing Provider  acetaminophen (TYLENOL) 500 MG tablet Take 2 tablets (1,000 mg total) by mouth every 8 (eight) hours as needed. 06/20/17   Alfonse Spruce, FNP  albuterol (PROVENTIL HFA;VENTOLIN HFA) 108 (90 Base) MCG/ACT inhaler Inhale 2 puffs into the lungs every 6 (six) hours as needed for wheezing or shortness of breath. 06/20/17   Hairston, Maylon Peppers, FNP  amLODipine (NORVASC) 5 MG  tablet Take 1 tablet (5 mg total) by mouth daily. 06/20/17   Alfonse Spruce, FNP  divalproex (DEPAKOTE ER) 500 MG 24 hr tablet Take 2 tablets (1,000 mg total) by mouth daily. Patient taking differently: Take 500 mg by mouth 2 (two) times daily.  09/07/16   Marcial Pacas, MD  SUMAtriptan (IMITREX) 50 MG tablet Take 1 tablet (50 mg total) by mouth every 2 (two) hours as needed for migraine (at the onset of migraine). May repeat in 2 hours if headache persists or recurs. 09/09/17   Davonna Belling, MD    Family History Family History  Problem Relation Age of Onset  . Heart disease Mother   . Migraines Mother   . Heart attack Father     Social History Social History  Substance Use Topics  . Smoking status: Never Smoker  . Smokeless tobacco: Never Used  . Alcohol use No     Allergies   Dilantin [phenytoin] and Aspirin   Review of Systems Review of Systems  Constitutional: Negative for appetite change.  HENT: Negative for congestion.   Eyes: Positive for photophobia.  Respiratory: Negative for shortness of breath.   Cardiovascular: Negative for chest pain.  Gastrointestinal: Positive for nausea and vomiting.  Genitourinary: Negative for dyspareunia.  Musculoskeletal: Negative for back pain.  Skin: Negative for rash  and wound.  Neurological: Positive for headaches. Negative for syncope and light-headedness.  Hematological: Negative for adenopathy.  Psychiatric/Behavioral: Negative for confusion.     Physical Exam Updated Vital Signs BP (!) 145/89 (BP Location: Left Arm)   Pulse 65   Temp 98.4 F (36.9 C) (Oral)   Resp 16   Ht 5\' 3"  (1.6 m)   Wt 90.7 kg (200 lb)   LMP 03/08/2016   SpO2 97%   BMI 35.43 kg/m   Physical Exam  Constitutional: She appears well-developed.  HENT:  Head: Atraumatic.  Neck: Neck supple.  Cardiovascular: Normal rate.   Pulmonary/Chest: Effort normal.  Abdominal: Soft.  Neurological: She displays normal reflexes.     ED  Treatments / Results  Labs (all labs ordered are listed, but only abnormal results are displayed) Labs Reviewed - No data to display  EKG  EKG Interpretation None       Radiology No results found.  Procedures Procedures (including critical care time)  Medications Ordered in ED Medications  promethazine (PHENERGAN) injection 12.5 mg (12.5 mg Intravenous Given 09/09/17 1848)  sodium chloride 0.9 % bolus 500 mL (500 mLs Intravenous New Bag/Given 09/09/17 1842)     Initial Impression / Assessment and Plan / ED Course  I have reviewed the triage vital signs and the nursing notes.  Pertinent labs & imaging results that were available during my care of the patient were reviewed by me and considered in my medical decision making (see chart for details).     Patient with migraine headache. History of same.Out of her triptan. Feels better after treatment. Will discharge home.  Final Clinical Impressions(s) / ED Diagnoses   Final diagnoses:  Migraine without status migrainosus, not intractable, unspecified migraine type    New Prescriptions Current Discharge Medication List       Davonna Belling, MD 09/09/17 2028

## 2017-09-09 NOTE — ED Notes (Signed)
Pt c/o "typical migraine". States she ran out of her "Sumatriptan". Also c/o nausea. PERRL Neuro WNL.

## 2017-09-09 NOTE — ED Notes (Signed)
RN rounded on pt. Pt covered entirely with blanket. States her HA is "a little better" and nausea is somewhat improved.

## 2017-09-09 NOTE — ED Notes (Signed)
ED Provider at bedside. 

## 2017-10-15 ENCOUNTER — Other Ambulatory Visit: Payer: Self-pay | Admitting: Neurology

## 2017-10-16 ENCOUNTER — Other Ambulatory Visit: Payer: Self-pay | Admitting: Family Medicine

## 2017-10-16 ENCOUNTER — Other Ambulatory Visit: Payer: Self-pay | Admitting: Neurology

## 2017-10-16 DIAGNOSIS — I10 Essential (primary) hypertension: Secondary | ICD-10-CM

## 2017-10-16 MED FILL — AMLODIPINE BESYLATE 5 MG TA: 5 | 30 days supply | Qty: 30 | Fill #2

## 2017-10-17 MED FILL — DIVALPROEX SOD ER 500 MG TA: 500 | 30 days supply | Qty: 60 | Fill #0

## 2017-12-13 ENCOUNTER — Ambulatory Visit: Payer: Self-pay | Attending: Family Medicine | Admitting: Family Medicine

## 2017-12-13 ENCOUNTER — Encounter: Payer: Self-pay | Admitting: Family Medicine

## 2017-12-13 VITALS — BP 150/100 | HR 68 | Temp 98.0°F | Resp 18 | Ht 63.0 in | Wt 196.0 lb

## 2017-12-13 DIAGNOSIS — Z8669 Personal history of other diseases of the nervous system and sense organs: Secondary | ICD-10-CM

## 2017-12-13 DIAGNOSIS — I10 Essential (primary) hypertension: Secondary | ICD-10-CM | POA: Insufficient documentation

## 2017-12-13 DIAGNOSIS — Z79899 Other long term (current) drug therapy: Secondary | ICD-10-CM | POA: Insufficient documentation

## 2017-12-13 DIAGNOSIS — Z760 Encounter for issue of repeat prescription: Secondary | ICD-10-CM | POA: Insufficient documentation

## 2017-12-13 DIAGNOSIS — G43909 Migraine, unspecified, not intractable, without status migrainosus: Secondary | ICD-10-CM | POA: Insufficient documentation

## 2017-12-13 DIAGNOSIS — G40909 Epilepsy, unspecified, not intractable, without status epilepticus: Secondary | ICD-10-CM | POA: Insufficient documentation

## 2017-12-13 MED ORDER — AMLODIPINE BESYLATE 10 MG PO TABS: 10.0000 mg | ORAL_TABLET | Freq: Every day | ORAL | 2 refills | Status: DC

## 2017-12-13 MED ORDER — DIVALPROEX SODIUM ER 500 MG PO TB24: 1000.0000 mg | ORAL_TABLET | Freq: Every day | ORAL | 0 refills | Status: DC

## 2017-12-13 MED FILL — AMLODIPINE BESYLATE 10 MG T: 10 | 30 days supply | Qty: 30 | Fill #0

## 2017-12-13 MED FILL — ?DIVALPROEX SOD ER 500MG TA: 500 | 30 days supply | Qty: 60 | Fill #0

## 2017-12-13 NOTE — Patient Instructions (Signed)
Epilepsy °Epilepsy is when a person keeps having seizures. A seizure is unusual activity in the brain. A seizure can change how you think or behave, and it can make it hard to be aware of what is happening. °This condition can cause problems, such as: °· Falls, accidents, and injury. °· Depression. °· Poor memory. °· Sudden unexplained death in epilepsy (SUDEP). This is rare. Its cause is not known. ° °Most people with epilepsy lead normal lives. °Follow these instructions at home: °Medicines ° °· Take medicines only as told by your doctor. °· Avoid anything that may keep your medicine from working, such as alcohol. °Activity °· Get enough rest. Lack of sleep can make seizures more likely to occur. °· Follow your doctor’s advice about driving, swimming, and doing anything else that would be dangerous if you had a seizure. °Teaching others °Teach friends and family what to do if you have a seizure. They should: °· Lay you on the ground to prevent a fall. °· Cushion your head and body. °· Loosen any tight clothing around your neck. °· Turn you on your side. °· Stay with you until you are better. °· Not hold you down. °· Not put anything in your mouth. °· Know whether or not you need emergency care. ° °General instructions °· Avoid anything that causes you to have seizures. °· Keep a seizure diary. Write down what you remember about each seizure, and especially what might have caused it. °· Keep all follow-up visits as told by your doctor. This is important. °Contact a doctor if: °· You have a change in your seizure pattern. °· You get an infection or start to feel sick. You may have more seizures when you are sick. °Get help right away if: °· A seizure does not stop after 5 minutes. °· You have more than one seizure in a row, and you do not have enough time between the seizures to feel better. °· A seizure makes it harder to breathe. °· A seizure is different from other seizures you have had. °· A seizure makes you  unable to speak or use a part of your body. °· You did not wake up right after a seizure. °This information is not intended to replace advice given to you by your health care provider. Make sure you discuss any questions you have with your health care provider. °Document Released: 09/10/2009 Document Revised: 06/19/2016 Document Reviewed: 05/23/2016 °Elsevier Interactive Patient Education © 2018 [employer] ° °

## 2017-12-13 NOTE — Progress Notes (Signed)
Subjective:  Patient ID: Tammy [name], female    DOB: June 03, 1966  Age: 52 y.o. MRN: [id number]  CC: Medication Refill   HPI [name] C Mogle presents for medication refills. PMH of hypertension, migraines, and seizure disorder. HTN:She is not exercising and is adherent to low salt diet.  She does not check BP at home. Cardiac symptoms none. Patient denies chest pain, claudication, dyspnea and palpitations.  Cardiovascular risk factors: hypertension, sedentary lifestyle and smoking/ tobacco exposure. Use of agents associated with hypertension: none. History of target organ damage: none. History of seizure disorder. She reports last episode of seizure was 11/25/17 history of ED visit 11/26/17.  She reports seizures started in adulthood.  She has had multiple episodes within the last 7 months. She does not know episode duration. She reports seizures usually occur at night. Premonitory symptoms include confusion. She does not know what seizures appear to be precipitated by. Symptoms associated with seizures are incontinence of urine, incontinence of stool, falling, and confusion. Patient does not have a history of head trauma.  She does not have a history of CVA. She does not have a history of alcohol abuse. She has reported marijuana use in the past. She does not have a history of developmental delay.Work up thus far has been: CT scan of head. Treatment thus far has been: valproic acid (Depakote). She is under the care of a neurologist. She reports not following up regularly due to financial constraints and co-pay. History of asthma. She denies any dyspnea, wheezing and cough. She denies any daily rescue inhaler use. History of migraine headaches.The headaches are usually throbbing and are bilateral, temporal in location. Home treatment has included acetaminophen with adequate relief. She reports taking sumatriptan but states she discontinued because " it was too strong". History of goiter. Reports 2 year  history. Denies any history of thyroid disease. She denies tender neck / sore throat. Prior studies include TSH.       Outpatient Medications Prior to Visit  Medication Sig Dispense Refill  . acetaminophen (TYLENOL) 500 MG tablet Take 2 tablets (1,000 mg total) by mouth every 8 (eight) hours as needed. 30 tablet 0  . albuterol (PROVENTIL HFA;VENTOLIN HFA) 108 (90 Base) MCG/ACT inhaler Inhale 2 puffs into the lungs every 6 (six) hours as needed for wheezing or shortness of breath. 1 Inhaler 2  . SUMAtriptan (IMITREX) 50 MG tablet Take 1 tablet (50 mg total) by mouth every 2 (two) hours as needed for migraine (at the onset of migraine). May repeat in 2 hours if headache persists or recurs. 10 tablet 0  . amLODipine (NORVASC) 5 MG tablet TAKE 1 TABLET BY MOUTH DAILY. 30 tablet 0  . divalproex (DEPAKOTE ER) 500 MG 24 hr tablet TAKE 2 TABLETS BY MOUTH DAILY. 60 tablet 0   No facility-administered medications prior to visit.     ROS Review of Systems  Constitutional: Negative.   Respiratory: Negative.   Cardiovascular: Negative.   Skin: Negative.   Neurological: Seizures: history of seizure disorder. Headaches: history of migraine.    Objective:  BP (!) 150/100 (BP Location: Right Arm, Cuff Size: Large)   Pulse 68   Temp 98 F (36.7 C) (Oral)   Resp 18   Ht 5\' 3"  (1.6 m)   Wt 196 lb (88.9 kg)   LMP 03/08/2016   SpO2 99%   BMI 34.72 kg/m   BP/Weight 12/13/2017 09/09/2017 2/44/0102  Systolic BP 725 366 440  Diastolic BP 347 89 92  Wt. (  Lbs) 196 200 204.4  BMI 34.72 35.43 36.21     Physical Exam  Constitutional: She is oriented to person, place, and time. She appears well-developed and well-nourished.  HENT:  Head: Normocephalic and atraumatic.  Right Ear: External ear normal.  Left Ear: External ear normal.  Nose: Nose normal.  Mouth/Throat: Oropharynx is clear and moist.  Eyes: Conjunctivae and EOM are normal. Pupils are equal, round, and reactive to light.  Neck:  Normal range of motion.  Cardiovascular: Normal rate, regular rhythm, normal heart sounds and intact distal pulses.  Pulmonary/Chest: Effort normal and breath sounds normal.  Abdominal: Soft. Bowel sounds are normal.  Neurological: She is alert and oriented to person, place, and time.  Skin: Skin is warm and dry.  Psychiatric: She has a normal mood and affect.  Nursing note and vitals reviewed.    Assessment & Plan:    1. Essential hypertension  - amLODipine (NORVASC) 10 MG tablet; Take 1 tablet (10 mg total) by mouth daily.  Dispense: 30 tablet; Refill: 2  2. History of migraine Recommend she follow up with her neurologist specialist.  - Ambulatory referral to Neurology  3. History of seizure disorder Recommend she follow up with her neurologist specialist. - Valproic acid level - divalproex (DEPAKOTE ER) 500 MG 24 hr tablet; Take 2 tablets (1,000 mg total) by mouth daily.  Dispense: 60 tablet; Refill: 0 - CMP and Liver - CBC - Ambulatory referral to Neurology  4. Medication refill  - Valproic acid level - CMP and Liver - CBC      Follow-up: Return in about 2 weeks (around 12/27/2017) for BP check.   Alfonse Spruce FNP

## 2017-12-27 ENCOUNTER — Encounter: Payer: Self-pay | Admitting: Family Medicine

## 2017-12-27 ENCOUNTER — Other Ambulatory Visit: Payer: Self-pay

## 2018-01-01 ENCOUNTER — Other Ambulatory Visit: Payer: Self-pay | Admitting: Family Medicine

## 2018-01-01 ENCOUNTER — Ambulatory Visit: Payer: Self-pay | Attending: Family Medicine

## 2018-01-01 VITALS — BP 152/92 | HR 77 | Temp 97.4°F | Ht 63.0 in

## 2018-01-01 DIAGNOSIS — I10 Essential (primary) hypertension: Secondary | ICD-10-CM | POA: Insufficient documentation

## 2018-01-01 DIAGNOSIS — Z760 Encounter for issue of repeat prescription: Secondary | ICD-10-CM

## 2018-01-01 DIAGNOSIS — Z0130 Encounter for examination of blood pressure without abnormal findings: Secondary | ICD-10-CM

## 2018-01-01 MED ORDER — LOSARTAN POTASSIUM 50 MG PO TABS: 50.0000 mg | ORAL_TABLET | Freq: Every day | ORAL | 2 refills | Status: DC

## 2018-01-01 MED ORDER — SUMATRIPTAN SUCCINATE 50 MG PO TABS
50.0000 mg | ORAL_TABLET | ORAL | 0 refills | Status: DC | PRN
Start: 2018-01-01 — End: 2018-07-25

## 2018-01-01 NOTE — Progress Notes (Signed)
Patient is here today for a blood pressure check. Patient blood pressure reading is 152/92. Ms. Tammy [name] was informed and Losartan 50 mg was prescribed. Patient is to return in 2 weeks for BP check with Carilyn Goodpasture.

## 2018-01-02 LAB — CMP AND LIVER
ALT: 9 IU/L (ref 0–32)
AST: 15 IU/L (ref 0–40)
Albumin: 4.4 g/dL (ref 3.5–5.5)
Alkaline Phosphatase: 60 IU/L (ref 39–117)
BUN: 19 mg/dL (ref 6–24)
Bilirubin Total: 0.2 mg/dL (ref 0.0–1.2)
Bilirubin, Direct: 0.06 mg/dL (ref 0.00–0.40)
CO2: 24 mmol/L (ref 20–29)
CREATININE: 0.88 mg/dL (ref 0.57–1.00)
Calcium: 9.7 mg/dL (ref 8.7–10.2)
Chloride: 102 mmol/L (ref 96–106)
GFR calc Af Amer: 89 mL/min/{1.73_m2} (ref >59)
GFR calc non Af Amer: 77 mL/min/{1.73_m2} (ref >59)
Glucose: 83 mg/dL (ref 65–99)
Potassium: 4.3 mmol/L (ref 3.5–5.2)
Sodium: 142 mmol/L (ref 134–144)
TOTAL PROTEIN: 7.5 g/dL (ref 6.0–8.5)

## 2018-01-02 LAB — CBC
Hematocrit: 40 % (ref 34.0–46.6)
Hemoglobin: 12.4 g/dL (ref 11.1–15.9)
MCH: 27.9 pg (ref 26.6–33.0)
MCHC: 31 g/dL — AB (ref 31.5–35.7)
MCV: 90 fL (ref 79–97)
PLATELETS: 281 10*3/uL (ref 150–379)
RBC: 4.45 x10E6/uL (ref 3.77–5.28)
RDW: 13.9 % (ref 12.3–15.4)
WBC: 7.9 10*3/uL (ref 3.4–10.8)

## 2018-01-02 LAB — VALPROIC ACID LEVEL: VALPROIC ACID LVL: 96 ug/mL (ref 50–100)

## 2018-01-04 ENCOUNTER — Telehealth (INDEPENDENT_AMBULATORY_CARE_PROVIDER_SITE_OTHER): Payer: Self-pay | Admitting: *Deleted

## 2018-01-04 NOTE — Telephone Encounter (Signed)
Medical Assistant left message on patient's home and cell voicemail. Voicemail states to give a call back to [place] with [facility] at [phone]. Patient is aware of labs being normal including kidney and liver function. Patient advised to continue with current Depakote medication, levels are normal on this dosing.

## 2018-01-04 NOTE — Telephone Encounter (Signed)
-----   Message from Alfonse Spruce, [place] sent at 01/04/2018  9:00 AM EST ----- Depakote levels normal on current dosage.  Labs normal. Kidney function normal Liver function normal

## 2018-01-15 ENCOUNTER — Encounter: Payer: Self-pay | Admitting: Pharmacist

## 2018-01-29 ENCOUNTER — Other Ambulatory Visit: Payer: Self-pay | Admitting: *Deleted

## 2018-01-29 DIAGNOSIS — Z8669 Personal history of other diseases of the nervous system and sense organs: Secondary | ICD-10-CM

## 2018-01-29 MED ORDER — DIVALPROEX SODIUM ER 500 MG PO TB24: 1000.0000 mg | ORAL_TABLET | Freq: Every day | ORAL | 0 refills | Status: DC

## 2018-01-29 MED FILL — DIVALPROEX SOD ER 500 MG TA: 500 | 30 days supply | Qty: 60 | Fill #0

## 2018-07-15 ENCOUNTER — Emergency Department (HOSPITAL_COMMUNITY)
Admission: EM | Admit: 2018-07-15 | Discharge: 2018-07-15 | Disposition: A | Discharge status: Home / Self Care | Payer: Self-pay | Attending: Emergency Medicine | Admitting: Emergency Medicine

## 2018-07-15 ENCOUNTER — Encounter (HOSPITAL_COMMUNITY): Payer: Self-pay | Admitting: Emergency Medicine

## 2018-07-15 ENCOUNTER — Other Ambulatory Visit: Payer: Self-pay

## 2018-07-15 DIAGNOSIS — R569 Unspecified convulsions: Secondary | ICD-10-CM | POA: Insufficient documentation

## 2018-07-15 DIAGNOSIS — J45909 Unspecified asthma, uncomplicated: Secondary | ICD-10-CM | POA: Insufficient documentation

## 2018-07-15 DIAGNOSIS — I10 Essential (primary) hypertension: Secondary | ICD-10-CM | POA: Insufficient documentation

## 2018-07-15 DIAGNOSIS — Z79899 Other long term (current) drug therapy: Secondary | ICD-10-CM | POA: Insufficient documentation

## 2018-07-15 LAB — CBC WITH DIFFERENTIAL/PLATELET
BASOS ABS: 0 10*3/uL (ref 0.0–0.1)
BASOS PCT: 0 %
Eosinophils Absolute: 0.1 10*3/uL (ref 0.0–0.7)
Eosinophils Relative: 1 %
HEMATOCRIT: 37.2 % (ref 36.0–46.0)
Hemoglobin: 11.9 g/dL — ABNORMAL LOW (ref 12.0–15.0)
Lymphocytes Relative: 16 %
Lymphs Abs: 1.2 10*3/uL (ref 0.7–4.0)
MCH: 28.2 pg (ref 26.0–34.0)
MCHC: 32 g/dL (ref 30.0–36.0)
MCV: 88.2 fL (ref 78.0–100.0)
Monocytes Absolute: 0.5 10*3/uL (ref 0.1–1.0)
Monocytes Relative: 7 %
NEUTROS ABS: 5.7 10*3/uL (ref 1.7–7.7)
NEUTROS PCT: 76 %
PLATELETS: 257 10*3/uL (ref 150–400)
RBC: 4.22 MIL/uL (ref 3.87–5.11)
RDW: 13.4 % (ref 11.5–15.5)
WBC: 7.5 10*3/uL (ref 4.0–10.5)

## 2018-07-15 LAB — BASIC METABOLIC PANEL
ANION GAP: 8 (ref 5–15)
BUN: 14 mg/dL (ref 6–20)
CO2: 26 mmol/L (ref 22–32)
Calcium: 9.2 mg/dL (ref 8.9–10.3)
Chloride: 107 mmol/L (ref 98–111)
Creatinine, Ser: 0.73 mg/dL (ref 0.44–1.00)
Glucose, Bld: 99 mg/dL (ref 70–99)
POTASSIUM: 3.9 mmol/L (ref 3.5–5.1)
SODIUM: 141 mmol/L (ref 135–145)

## 2018-07-15 MED ORDER — ONDANSETRON HCL 4 MG/2ML IJ SOLN
4.0000 mg | Freq: Once | INTRAMUSCULAR | Status: AC
  Administered 2018-07-15: 4 mg via INTRAVENOUS
  Filled 2018-07-15: qty 2

## 2018-07-15 MED ORDER — DIVALPROEX SODIUM ER 500 MG PO TB24: 1000.0000 mg | ORAL_TABLET | Freq: Every day | ORAL | 0 refills | Status: DC

## 2018-07-15 MED ORDER — SUMATRIPTAN SUCCINATE 6 MG/0.5ML ~~LOC~~ SOLN
6.0000 mg | Freq: Once | SUBCUTANEOUS | Status: AC
  Administered 2018-07-15: 6 mg via SUBCUTANEOUS
  Filled 2018-07-15: qty 0.5

## 2018-07-15 MED ORDER — SUMATRIPTAN SUCCINATE 50 MG PO TABS
50.0000 mg | ORAL_TABLET | Freq: Once | ORAL | Status: DC
  Filled 2018-07-15: qty 1

## 2018-07-15 MED ORDER — SUMATRIPTAN SUCCINATE 50 MG PO TABS: 50.0000 mg | ORAL_TABLET | ORAL | 0 refills | Status: DC | PRN

## 2018-07-15 MED ORDER — ACETAMINOPHEN 325 MG PO TABS
650.0000 mg | ORAL_TABLET | Freq: Once | ORAL | Status: AC
  Administered 2018-07-15: 650 mg via ORAL
  Filled 2018-07-15: qty 2

## 2018-07-15 MED ORDER — VALPROATE SODIUM 500 MG/5ML IV SOLN
1000.0000 mg | Freq: Once | INTRAVENOUS | Status: AC
  Administered 2018-07-15: 1000 mg via INTRAVENOUS
  Filled 2018-07-15: qty 10

## 2018-07-15 MED FILL — SUMAtriptan SUCCINATE 50 MG: 50 | 30 days supply | Qty: 9 | Fill #0

## 2018-07-15 MED FILL — DIVALPROEX SOD DR 500 MG TA: 500 | 30 days supply | Qty: 60 | Fill #0

## 2018-07-15 NOTE — Care Management Note (Signed)
Case Management Note  CM consulted for no ins and need for medication assistance.  CM noted pt has been MATCHed in 2017 but qualifies for it again.  CM noted pt is active at the [facility].  CM MATCHed pt.  Placed [place] information on AVS with pharmacy and financial counseling information.  Placed pt's PCP on AVS for follow up.  CM spoke with pt and daughter at bedside and provided the [facility] letter.  Pt and daughter understood instructions for follow up.  Updated [place], [place].  Sent a message to [facility] CM to pass on pt's needs.  No further CM needs noted at this time.  Leone Putman, Benjaman Lobe, RN 07/15/2018, 10:09 AM

## 2018-07-15 NOTE — ED Triage Notes (Signed)
Per EMS, pt. From home with complaint of witnessed seizure at around 5am this morning . Pt. Was on her bed with her sister and seizures noted ,claimed to last about 5-7 mins. Post ictal . Pt. Stated that she run out of seizure meds for several weeks now. Pt. A/O x 4 upon arrival to ED.

## 2018-07-15 NOTE — ED Notes (Signed)
WARM COMPRESS GIVEN

## 2018-07-15 NOTE — ED Notes (Signed)
Bed: SP19 Expected date:  Expected time:  Means of arrival:  Comments: EMS 52 yo female from home/seizure-has not taken Dilantin in several weeks

## 2018-07-15 NOTE — Discharge Instructions (Addendum)
Please follow-up with your primary care provider on this matter.  Be sure to begin taking your medications again. Call the clinic today and have them refill these medications.   Per [facility] statutes, patients with seizures are not allowed to drive until  they have been seizure-free for six months. Use caution when using heavy equipment or power tools. Avoid working on ladders or at heights. Take showers instead of baths. Ensure the water temperature is not too high on the home water heater. Do not go swimming alone. When caring for infants or small children, sit down when holding, feeding, or changing them to minimize risk of injury to the child in the event you have a seizure.   Also, Maintain good sleep hygiene. Avoid alcohol.   --> Call 911 and bring the patient back to the ED if:                   A.  The seizure lasts longer than 5 minutes.                  B.  The patient doesn't awaken shortly after the seizure             C.  The patient has new problems such as difficulty seeing, speaking or moving             D.  The patient was injured during the seizure             E.  The patient has a temperature over 102 F (39C)             F.  The patient vomited and now is having trouble breathing

## 2018-07-15 NOTE — ED Notes (Signed)
EDPA Provider at bedside. [name]

## 2018-07-15 NOTE — ED Provider Notes (Signed)
[facility] DEPT Provider Note   CSN: [id number] Arrival date & time: 07/15/18  1601     History   Chief Complaint Chief Complaint  Patient presents with  . Seizures    HPI Tammy [name] is a 52 y.o. female.  HPI   Tammy [name] is a 52 y.o. female, with a history of seizures and HTN, presenting to the ED with a seizure this morning. Full body shaking witnessed by patient's sister and reportedly lasted 5 to 7 minutes.  Postictal upon EMS arrival. Patient states she has been out of her Depakote for a few months, stating financial concerns.  She states her last seizure was around May or June 2019. Complains of a bilateral frontal headache, throbbing, moderate, nonradiating.  States she typically has migraines following her seizures and headache is typical of her migraine headaches. Denies confusion, falls/trauma, neck/back pain, weakness/numbness/other neuro deficits, chest pain, fever/chills, recent illness, or any other complaints.     Past Medical History:  Diagnosis Date  . Asthma   . Headache(784.0)   . Hypertension   . Seizures [facility])     Patient Active Problem List   Diagnosis Date Noted  . Chronic migraine 09/07/2016  . Seizures ([place]) 08/21/2016  . Seizure ([place]) 08/20/2016  . Asthma 08/20/2016  . Nausea and vomiting 08/20/2016  . Elevated blood pressure 08/20/2016  . DENTAL PAIN 02/12/2009  . DVT, HX OF 01/20/2008  . FIBROIDS, UTERUS 01/16/2008  . COUGH 01/16/2008  . EMBOLISM/THROMBOSIS, DEEP VSL LWR EXTRM NOS 06/03/2007    Past Surgical History:  Procedure Laterality Date  . HERNIA REPAIR    . UTERINE FIBROID SURGERY       OB History    Gravida  3   Para      Term      Preterm      AB  1   Living  2     SAB  1   TAB  0   Ectopic      Multiple      Live Births               Home Medications    Prior to Admission medications   Medication Sig Start Date End Date Taking? Authorizing  Provider  acetaminophen (TYLENOL) 500 MG tablet Take 2 tablets (1,000 mg total) by mouth every 8 (eight) hours as needed. 06/20/17  Yes Alfonse Spruce, FNP  divalproex (DEPAKOTE ER) 500 MG 24 hr tablet Take 2 tablets (1,000 mg total) by mouth daily. 01/29/18  Yes Tresa Garter, MD  SUMAtriptan (IMITREX) 50 MG tablet Take 1 tablet (50 mg total) by mouth every 2 (two) hours as needed for migraine (at the onset of migraine). May repeat in 2 hours if headache persists or recurs. 01/01/18  Yes Hairston, Maylon Peppers, FNP  albuterol (PROVENTIL HFA;VENTOLIN HFA) 108 (90 Base) MCG/ACT inhaler Inhale 2 puffs into the lungs every 6 (six) hours as needed for wheezing or shortness of breath. Patient not taking: Reported on 07/15/2018 06/20/17   Alfonse Spruce, FNP  amLODipine (NORVASC) 10 MG tablet Take 1 tablet (10 mg total) by mouth daily. Patient not taking: Reported on 07/15/2018 12/13/17   Alfonse Spruce, FNP  divalproex (DEPAKOTE ER) 500 MG 24 hr tablet Take 2 tablets (1,000 mg total) by mouth daily. 07/15/18 08/14/18  Joy, [name] C, PA-C  losartan (COZAAR) 50 MG tablet Take 1 tablet (50 mg total) by mouth daily. Patient not taking: Reported  on 07/15/2018 01/01/18   Alfonse Spruce, FNP  SUMAtriptan (IMITREX) 50 MG tablet Take 1 tablet (50 mg total) by mouth every 2 (two) hours as needed for migraine. May repeat in 2 hours if headache persists or recurs. 07/15/18   Joy, Helane Gunther, PA-C    Family History Family History  Problem Relation Age of Onset  . Heart disease Mother   . Migraines Mother   . Heart attack Father     Social History Social History   Tobacco Use  . Smoking status: Never Smoker  . Smokeless tobacco: Never Used  Substance Use Topics  . Alcohol use: No  . Drug use: Yes    Types: Marijuana     Allergies   Dilantin [phenytoin]; Aspirin; and Tramadol   Review of Systems Review of Systems  Constitutional: Negative for chills and fever.  Respiratory: Negative  for shortness of breath.   Cardiovascular: Negative for chest pain.  Gastrointestinal: Negative for abdominal pain, diarrhea, nausea and vomiting.  Musculoskeletal: Negative for back pain and neck pain.  Neurological: Positive for seizures. Negative for dizziness, weakness, light-headedness and numbness.  All other systems reviewed and are negative.    Physical Exam Updated Vital Signs BP (!) 136/95 (BP Location: Left Arm)   Pulse 69   Temp 98 F (36.7 C) (Oral)   Resp 17   LMP 03/08/2016   SpO2 98%   Physical Exam  Constitutional: She is oriented to person, place, and time. She appears well-developed and well-nourished. No distress.  HENT:  Head: Normocephalic.  Some abrasions to the left side of the tongue.  No noted lacerations.  Eyes: Pupils are equal, round, and reactive to light. Conjunctivae and EOM are normal.  Neck: Neck supple.  Cardiovascular: Normal rate, regular rhythm, normal heart sounds and intact distal pulses.  Pulmonary/Chest: Effort normal and breath sounds normal. No respiratory distress.  Abdominal: Soft. There is no tenderness. There is no guarding.  Musculoskeletal: She exhibits no edema.  Lymphadenopathy:    She has no cervical adenopathy.  Neurological: She is alert and oriented to person, place, and time.  Sensation grossly intact to light touch in the extremities. Strength 5/5 in all extremities. No gait disturbance. Coordination intact. Cranial nerves III-XII grossly intact. No facial droop.   Skin: Skin is warm and dry. She is not diaphoretic.  Psychiatric: She has a normal mood and affect. Her behavior is normal.  Nursing note and vitals reviewed.    ED Treatments / Results  Labs (all labs ordered are listed, but only abnormal results are displayed) Labs Reviewed  CBC WITH DIFFERENTIAL/PLATELET - Abnormal; Notable for the following components:      Result Value   Hemoglobin 11.9 (*)    All other components within normal limits  BASIC  METABOLIC PANEL    EKG None  Radiology No results found.  Procedures Procedures (including critical care time)  Medications Ordered in ED Medications  valproate (DEPACON) 1,000 mg in dextrose 5 % 50 mL IVPB (0 mg Intravenous Stopped 07/15/18 0852)  acetaminophen (TYLENOL) tablet 650 mg (650 mg Oral Given 07/15/18 0853)  ondansetron (ZOFRAN) injection 4 mg (4 mg Intravenous Given 07/15/18 1040)  SUMAtriptan (IMITREX) injection 6 mg (6 mg Subcutaneous Given 07/15/18 1040)     Initial Impression / Assessment and Plan / ED Course  I have reviewed the triage vital signs and the nursing notes.  Pertinent labs & imaging results that were available during my care of the patient were reviewed by  me and considered in my medical decision making (see chart for details).  Clinical Course as of Jul 15 1637  Mon Jul 15, 2018  0630 Spoke with Merrilee Seashore, pharmacist, for recommendation on converting patient was previously prescribed Depakote ER to IV valproic acid.  Recommends 1000 mg valproate IV.   [SJ]  1015 Spoke with Levada Dy, case Freight forwarder.  States patient has had assistance with her medications in the past, but is repeatedly noncompliant.  She has been referred to community health and wellness and can still follow-up with them. States the best avenue for her to get her medications is to have them refilled by the health and wellness providers.   [SJ]    Clinical Course User Index [SJ] Lorayne Bender, PA-C    Patient presents following seizure.  Patient has been noncompliant with her medications.  Patient was counseled on this matter.  She was observed here in the ED without recurrence of seizure.  She was represcribed her seizure and migraine medications.  She was placed on seizure precautions and counseled on restrictions. The patient was given instructions for home care as well as return precautions. Patient voices understanding of these instructions, accepts the plan, and is comfortable with  discharge.  Vitals:   07/15/18 0830 07/15/18 0930 07/15/18 1100 07/15/18 1118  BP: (!) 139/92 (!) 132/93 (!) 158/98 (!) 158/98  Pulse: 71 76 (!) 54 69  Resp:  14  15  Temp:      TempSrc:      SpO2: 100% 98% 92% 98%  Weight:      Height:         Final Clinical Impressions(s) / ED Diagnoses   Final diagnoses:  Seizure [facility])    ED Discharge Orders         Ordered    divalproex (DEPAKOTE ER) 500 MG 24 hr tablet  Daily     07/15/18 1135    SUMAtriptan (IMITREX) 50 MG tablet  Every 2 hours PRN     07/15/18 [place], [name] C, PA-C 07/15/18 1640    Ripley Fraise, MD 07/16/18 [phone]

## 2018-07-15 NOTE — ED Notes (Signed)
TTS AT BEDSIDE 

## 2018-07-15 NOTE — ED Notes (Signed)
NO RX GIVEN 

## 2018-07-15 NOTE — ED Notes (Signed)
RX X 2 GIVEN- PT AND DAUGHTER AWARE OF NEED TO PICK UP MEDS AND MAKE APPT. PT AND DAUGHTER VERBALIZED UNDERSTANDING

## 2018-07-24 NOTE — Progress Notes (Signed)
Patient ID: Tammy [name], female   DOB: 04-Nov-1966, 52 y.o.   MRN: [id number]       Tammy [name], is a 52 y.o. female  YHC:[id number]  [id number]  DOB - 05-02-1966  Subjective:  Chief Complaint and HPI: Tammy [name] is a 52 y.o. female here today for a follow up visit After being seen in the ED for seizure on 07/15/2018.  No further seizures.  She was restarted on her meds.  Saw neurologist about 1 year ago and there were no changes to her regimen.  As long as she is on meds, she does fine.  Cant afford to go to the neurologist right now.  She says neurologist is ok for PCP to follow as long as there are no changes.  She hasn't taken BP meds in a long time.  When she checks BP OOO it is normal or borderline.     Social:  Lives with daughter bc cant live alone due to seizures.   From ED note:  52 y.o. female, with a history of seizures and HTN, presenting to the ED with a seizure this morning. Full body shaking witnessed by patient's sister and reportedly lasted 5 to 7 minutes.  Postictal upon EMS arrival. Patient states she has been out of her Depakote for a few months, stating financial concerns.  She states her last seizure was around May or June 2019. Complains of a bilateral frontal headache, throbbing, moderate, nonradiating.  States she typically has migraines following her seizures and headache is typical of her migraine headaches. Denies confusion, falls/trauma, neck/back pain, weakness/numbness/other neuro deficits, chest pain, fever/chills, recent illness, or any other complaints  From A/P: Patient presents following seizure.  Patient has been noncompliant with her medications.  Patient was counseled on this matter.  She was observed here in the ED without recurrence of seizure.  She was represcribed her seizure and migraine medications.  She was placed on seizure precautions and counseled on restrictions. The patient was given instructions for home care as well as return  precautions. Patient voices understanding of these instructions, accepts the plan, and is comfortable with discharge.  ED/Hospital notes reviewed.  BMP/CBC WNL  ROS:   Constitutional:  No f/c, No night sweats, No unexplained weight loss. EENT:  No vision changes, No blurry vision, No hearing changes. No mouth, throat, or ear problems.  Respiratory: No cough, No SOB Cardiac: No CP, no palpitations GI:  No abd pain, No N/V/D. GU: No Urinary s/sx Musculoskeletal: No joint pain Neuro: No headache, no dizziness, no motor weakness.  Skin: No rash Endocrine:  No polydipsia. No polyuria.  Psych: Denies SI/HI  No problems updated.  ALLERGIES: Allergies  Allergen Reactions  . Dilantin [Phenytoin] Swelling  . Aspirin Other (See Comments)    Patient unsure of reaction   . Tramadol Itching    Pt states she ins unsure of reaction Pt states she ins unsure of reaction    PAST MEDICAL HISTORY: Past Medical History:  Diagnosis Date  . Asthma   . Headache(784.0)   . Hypertension   . Seizures ([place])     MEDICATIONS AT HOME: Prior to Admission medications   Medication Sig Start Date End Date Taking? Authorizing Provider  acetaminophen (TYLENOL) 500 MG tablet Take 2 tablets (1,000 mg total) by mouth every 8 (eight) hours as needed. 06/20/17  Yes Hairston, Maylon Peppers, FNP  albuterol (PROVENTIL HFA;VENTOLIN HFA) 108 (90 Base) MCG/ACT inhaler Inhale 2 puffs into the lungs every 6 (six) hours  as needed for wheezing or shortness of breath. 06/20/17  Yes Hairston, Mandesia R, FNP  divalproex (DEPAKOTE ER) 500 MG 24 hr tablet Take 2 tablets (1,000 mg total) by mouth daily. 07/25/18 08/24/18 Yes Shaylie Eklund, Dionne Bucy, PA-C  SUMAtriptan (IMITREX) 50 MG tablet Take 1 tablet (50 mg total) by mouth every 2 (two) hours as needed for migraine. May repeat in 2 hours if headache persists or recurs. 07/15/18  Yes Joy, [name] C, PA-C     Objective:  EXAM:   Vitals:   07/25/18 1002  BP: 139/86  Pulse: 69  Resp:  18  Temp: 97.9 F (36.6 C)  TempSrc: Oral  SpO2: 98%  Weight: 202 lb 3.2 oz (91.7 kg)  Height: 5\' 3"  (1.6 m)    General appearance : A&OX3. NAD. Non-toxic-appearing HEENT: Atraumatic and Normocephalic.  PERRLA. EOM intact.  Neck: supple, no JVD. No cervical lymphadenopathy. No thyromegaly Chest/Lungs:  Breathing-non-labored, Good air entry bilaterally, breath sounds normal without rales, rhonchi, or wheezing  CVS: S1 S2 regular, no murmurs, gallops, rubs  Extremities: Bilateral Lower Ext shows no edema, both legs are warm to touch with = pulse throughout Neurology:  CN II-XII grossly intact, Non focal.   Psych:  TP linear. J/I WNL. Normal speech. Appropriate eye contact and affect.  Skin:  No Rash  Data Review No results found for: HGBA1C   Assessment & Plan   1. Seizures ([place]) Stable as long as she is on meds - divalproex (DEPAKOTE ER) 500 MG 24 hr tablet; Take 2 tablets (1,000 mg total) by mouth daily.  Dispense: 60 tablet; Refill: 5 - Valproic acid level  2. BP check BP is high normal today.  Check BP OOO and record and bring to next visit.    3. Hospital discharge follow-up Stable.       Patient have been counseled extensively about nutrition and exercise  Return in about 3 months (around 10/25/2018) for assign PCP; f/up seizures and BP check.  The patient was given clear instructions to go to ER or return to medical center if symptoms don't improve, worsen or new problems develop. The patient verbalized understanding. The patient was told to call to get lab results if they haven't Tammy anything in the next week.     Freeman Caldron, PA-C [facility] and [facility], [place]   07/25/2018, 10:22 AM

## 2018-07-25 ENCOUNTER — Ambulatory Visit: Payer: Self-pay | Attending: Physician Assistant | Admitting: Physician Assistant

## 2018-07-25 VITALS — BP 139/86 | HR 69 | Temp 97.9°F | Resp 18 | Ht 63.0 in | Wt 202.2 lb

## 2018-07-25 DIAGNOSIS — J45909 Unspecified asthma, uncomplicated: Secondary | ICD-10-CM | POA: Insufficient documentation

## 2018-07-25 DIAGNOSIS — Z9114 Patient's other noncompliance with medication regimen: Secondary | ICD-10-CM | POA: Insufficient documentation

## 2018-07-25 DIAGNOSIS — Z0130 Encounter for examination of blood pressure without abnormal findings: Secondary | ICD-10-CM

## 2018-07-25 DIAGNOSIS — I10 Essential (primary) hypertension: Secondary | ICD-10-CM | POA: Insufficient documentation

## 2018-07-25 DIAGNOSIS — Z888 Allergy status to other drugs, medicaments and biological substances status: Secondary | ICD-10-CM | POA: Insufficient documentation

## 2018-07-25 DIAGNOSIS — G43909 Migraine, unspecified, not intractable, without status migrainosus: Secondary | ICD-10-CM | POA: Insufficient documentation

## 2018-07-25 DIAGNOSIS — Z886 Allergy status to analgesic agent status: Secondary | ICD-10-CM | POA: Insufficient documentation

## 2018-07-25 DIAGNOSIS — R569 Unspecified convulsions: Secondary | ICD-10-CM | POA: Insufficient documentation

## 2018-07-25 DIAGNOSIS — Z79899 Other long term (current) drug therapy: Secondary | ICD-10-CM | POA: Insufficient documentation

## 2018-07-25 DIAGNOSIS — Z09 Encounter for follow-up examination after completed treatment for conditions other than malignant neoplasm: Secondary | ICD-10-CM | POA: Insufficient documentation

## 2018-07-25 MED ORDER — DIVALPROEX SODIUM ER 500 MG PO TB24: 1000.0000 mg | ORAL_TABLET | Freq: Every day | ORAL | 5 refills | Status: DC

## 2018-07-25 NOTE — Patient Instructions (Signed)
Check Blood pressure outside of the office 3 times/week and record and bring to next visit.

## 2018-07-26 LAB — VALPROIC ACID LEVEL: VALPROIC ACID LVL: 98 ug/mL (ref 50–100)

## 2018-07-30 ENCOUNTER — Telehealth (INDEPENDENT_AMBULATORY_CARE_PROVIDER_SITE_OTHER): Payer: Self-pay | Admitting: *Deleted

## 2018-07-30 NOTE — Telephone Encounter (Signed)
-----   Message from [place] Donovan, [place] sent at 07/27/2018  8:14 AM EDT ----- Your levels are therapeutic since resuming your medication.  Continue current dose.  Thanks, Freeman Caldron, PA-C

## 2018-07-30 NOTE — Telephone Encounter (Signed)
Medical Assistant left message on patient's home and cell voicemail. Patient is aware of being within the therapeutic range and to continue the current dosage. Voicemail states to give a call back to [place] with [facility] at [phone] if there are any questions.

## 2018-08-07 MED FILL — DIVALPROEX SOD ER 500 MG TA: 500 | 30 days supply | Qty: 60 | Fill #0

## 2018-09-10 MED FILL — DIVALPROEX SOD ER 500 MG TA: 500 | 30 days supply | Qty: 60 | Fill #1

## 2018-10-16 MED FILL — DIVALPROEX SOD ER 500 MG TA: 500 | 30 days supply | Qty: 60 | Fill #2

## 2018-10-28 ENCOUNTER — Ambulatory Visit: Payer: Self-pay | Admitting: Family Medicine

## 2018-12-10 MED FILL — DIVALPROEX SOD ER 500 MG TA: 500 | 30 days supply | Qty: 60 | Fill #3

## 2018-12-12 ENCOUNTER — Ambulatory Visit: Payer: Self-pay | Attending: Family Medicine | Admitting: Physician Assistant

## 2018-12-12 VITALS — BP 134/88 | HR 72 | Temp 97.7°F | Resp 18 | Ht 62.0 in | Wt 214.0 lb

## 2018-12-12 DIAGNOSIS — IMO0002 Reserved for concepts with insufficient information to code with codable children: Secondary | ICD-10-CM

## 2018-12-12 DIAGNOSIS — M7989 Other specified soft tissue disorders: Secondary | ICD-10-CM

## 2018-12-12 DIAGNOSIS — M79605 Pain in left leg: Secondary | ICD-10-CM

## 2018-12-12 DIAGNOSIS — R569 Unspecified convulsions: Secondary | ICD-10-CM

## 2018-12-12 DIAGNOSIS — G43709 Chronic migraine without aura, not intractable, without status migrainosus: Secondary | ICD-10-CM

## 2018-12-12 MED ORDER — SUMATRIPTAN SUCCINATE 50 MG PO TABS: 50.0000 mg | ORAL_TABLET | ORAL | 2 refills | Status: DC | PRN

## 2018-12-12 MED ORDER — DIVALPROEX SODIUM ER 500 MG PO TB24: 1000.0000 mg | ORAL_TABLET | Freq: Every day | ORAL | 5 refills | Status: DC

## 2018-12-12 NOTE — Progress Notes (Signed)
Patient ID: Tammy [name], female   DOB: 1965-11-29, 53 y.o.   MRN: [id number]   Tammy [name], is a 53 y.o. female  CVE:[id number]  [id number]  DOB - 05-09-1966  Subjective:  Chief Complaint and HPI: [name] Tammy [name] is a 53 y.o. female here today with L calf and shin pain with bruising and swelling.  NKI.  Started about 5 days ago.  Started with bruising at shin and now extending to back of calf and feels warm to the touch.  No CP.  No SOB.  No h/o clotting issues.     ROS:   Constitutional:  No f/c, No night sweats, No unexplained weight loss. EENT:  No vision changes, No blurry vision, No hearing changes. No mouth, throat, or ear problems.  Respiratory: No cough, No SOB Cardiac: No CP, no palpitations GI:  No abd pain, No N/V/D. GU: No Urinary s/sx Musculoskeletal: + L lower leg pain Neuro: No headache, no dizziness, no motor weakness.  Skin: No rash Endocrine:  No polydipsia. No polyuria.  Psych: Denies SI/HI  No problems updated.  ALLERGIES: Allergies  Allergen Reactions  . Dilantin [Phenytoin] Swelling  . Aspirin Other (See Comments)    Patient unsure of reaction   . Tramadol Itching    Pt states she ins unsure of reaction Pt states she ins unsure of reaction    PAST MEDICAL HISTORY: Past Medical History:  Diagnosis Date  . Asthma   . Headache(784.0)   . Hypertension   . Seizures ([place])     MEDICATIONS AT HOME: Prior to Admission medications   Medication Sig Start Date End Date Taking? Authorizing Provider  acetaminophen (TYLENOL) 500 MG tablet Take 2 tablets (1,000 mg total) by mouth every 8 (eight) hours as needed. 06/20/17   Alfonse Spruce, FNP  albuterol (PROVENTIL HFA;VENTOLIN HFA) 108 (90 Base) MCG/ACT inhaler Inhale 2 puffs into the lungs every 6 (six) hours as needed for wheezing or shortness of breath. 06/20/17   Alfonse Spruce, FNP  divalproex (DEPAKOTE ER) 500 MG 24 hr tablet Take 2 tablets (1,000 mg total) by mouth daily for 30  days. 12/12/18 01/11/19  Tammy Donovan, PA-C  SUMAtriptan (IMITREX) 50 MG tablet Take 1 tablet (50 mg total) by mouth every 2 (two) hours as needed for migraine. May repeat in 2 hours if headache persists or recurs. 12/12/18   Tammy Donovan, PA-C     Objective:  EXAM:   Vitals:   12/12/18 1425  BP: 134/88  Pulse: 72  Resp: 18  Temp: 97.7 F (36.5 C)  TempSrc: Oral  SpO2: 97%  Weight: 214 lb (97.1 kg)  Height: 5\' 2"  (1.575 m)    General appearance : A&OX3. NAD. Non-toxic-appearing HEENT: Atraumatic and Normocephalic.  PERRLA. EOM intact.   Neck: supple, no JVD. No cervical lymphadenopathy. No thyromegaly Chest/Lungs:  Breathing-non-labored, Good air entry bilaterally, breath sounds normal without rales, rhonchi, or wheezing  CVS: S1 S2 regular, no murmurs, gallops, rubs  Extremities: Bilateral Lower Ext examined.  R calf 42.5cm in diameter.  L calf 45cm in diameter. +L Homan's There is ecchymoses present on the shin and calf.  Mildly warm and  TTP over L calf with minimal erythema. BP pulses=B Neurology:  CN II-XII grossly intact, Non focal.   Psych:  TP linear. J/I WNL. Normal speech. Appropriate eye contact and affect.  Skin:  No Rash  Data Review No results found for: HGBA1C   Assessment & Plan   1. Left leg  swelling Call 911 if CP/SOB.  Patient agrees - VAS [place] LOWER EXTREMITY VENOUS (DVT); Future  2. Seizures ([facility]) stable - divalproex (DEPAKOTE ER) 500 MG 24 hr tablet; Take 2 tablets (1,000 mg total) by mouth daily for 30 days.  Dispense: 60 tablet; Refill: 5  3. Chronic migraine Stable-no changes - SUMAtriptan (IMITREX) 50 MG tablet; Take 1 tablet (50 mg total) by mouth every 2 (two) hours as needed for migraine. May repeat in 2 hours if headache persists or recurs.  Dispense: 10 tablet; Refill: 2  4. Left leg pain - VAS [place] LOWER EXTREMITY VENOUS (DVT); stat and ABI ordered   Patient have been counseled extensively about nutrition and exercise  Return  in about 1 month (around 01/12/2019) for assign PCP; fasting bloodwork.  The patient was given clear instructions to go to ER or return to medical center if symptoms don't improve, worsen or new problems develop. The patient verbalized understanding. The patient was told to call to get lab results if they haven't heard anything in the next week.     Tammy Caldron, PA-C [facility] and [place], [place]   12/12/2018, 2:37 PM

## 2018-12-13 ENCOUNTER — Telehealth: Payer: Self-pay | Admitting: Family Medicine

## 2018-12-13 ENCOUNTER — Ambulatory Visit: Payer: Self-pay | Attending: Internal Medicine | Admitting: Internal Medicine

## 2018-12-13 ENCOUNTER — Ambulatory Visit (HOSPITAL_COMMUNITY)
Admission: RE | Admit: 2018-12-13 | Discharge: 2018-12-13 | Disposition: A | Discharge status: Home / Self Care | Payer: Self-pay | Source: Ambulatory Visit | Attending: Physician Assistant | Admitting: Physician Assistant

## 2018-12-13 VITALS — BP 168/112 | HR 71 | Temp 97.6°F | Resp 18 | Ht 68.0 in | Wt 221.0 lb

## 2018-12-13 DIAGNOSIS — R569 Unspecified convulsions: Secondary | ICD-10-CM | POA: Insufficient documentation

## 2018-12-13 DIAGNOSIS — M7989 Other specified soft tissue disorders: Secondary | ICD-10-CM | POA: Insufficient documentation

## 2018-12-13 DIAGNOSIS — M79605 Pain in left leg: Secondary | ICD-10-CM | POA: Insufficient documentation

## 2018-12-13 DIAGNOSIS — Z886 Allergy status to analgesic agent status: Secondary | ICD-10-CM | POA: Insufficient documentation

## 2018-12-13 DIAGNOSIS — I10 Essential (primary) hypertension: Secondary | ICD-10-CM | POA: Insufficient documentation

## 2018-12-13 DIAGNOSIS — Z888 Allergy status to other drugs, medicaments and biological substances status: Secondary | ICD-10-CM | POA: Insufficient documentation

## 2018-12-13 DIAGNOSIS — Z8249 Family history of ischemic heart disease and other diseases of the circulatory system: Secondary | ICD-10-CM | POA: Insufficient documentation

## 2018-12-13 DIAGNOSIS — Z79899 Other long term (current) drug therapy: Secondary | ICD-10-CM | POA: Insufficient documentation

## 2018-12-13 DIAGNOSIS — J45909 Unspecified asthma, uncomplicated: Secondary | ICD-10-CM | POA: Insufficient documentation

## 2018-12-13 DIAGNOSIS — I82812 Embolism and thrombosis of superficial veins of left lower extremities: Secondary | ICD-10-CM | POA: Insufficient documentation

## 2018-12-13 MED ORDER — RIVAROXABAN 10 MG PO TABS: 10.0000 mg | ORAL_TABLET | Freq: Every day | ORAL | 0 refills | Status: DC

## 2018-12-13 MED ORDER — LISINOPRIL-HYDROCHLOROTHIAZIDE 10-12.5 MG PO TABS: 1.0000 | ORAL_TABLET | Freq: Every day | ORAL | 1 refills | Status: DC

## 2018-12-13 MED ORDER — RIVAROXABAN 15 MG PO TABS: 15.0000 mg | ORAL_TABLET | Freq: Every day | ORAL | 0 refills | Status: DC

## 2018-12-13 NOTE — Patient Instructions (Signed)
Try to keep the left leg elevated as much as possible. Start Xarelto for blood thinner as we discussed today.  Your blood pressure is not controlled.  This can increase your risk for heart attacks and strokes.  Try to limit salt in the foods as discussed today. Start the blood pressure lowering medication called lisinopril/hydrochlorothiazide.  This prescription was sent to your pharmacy.

## 2018-12-13 NOTE — Progress Notes (Signed)
RLE venous duplex       has been completed. Preliminary results can be found under CV proc through chart review. June Leap, BS, RDMS, RVT    Called results to [facility] health and wellness. They will call patient to set up appointment for Monday.

## 2018-12-13 NOTE — Telephone Encounter (Signed)
Received call on the after-hours line patient was attempting to fill prescription written for Xarelto 10 mg however the voucher she was given from provider was only for 15 and 20 mg tablets.  Sent over prescription for 15 mg of Xarelto once daily.  Will CC initial ordering provider and Dr. follow-up as patient will be a patient of record of hers to review and case they recommend any additional changes.  Patient was unable to get medications today as called only came in moments before the pharmacy was scheduled to close.  E prescribed 15 mg of Xarelto.

## 2018-12-13 NOTE — Progress Notes (Signed)
MA took report for patients DVT test at the hospital. Tammy [name] reported no DVT was noted but a superficial thrombus radiating up the entire leg just about going into deep vein. MA shared this with Dr. Wynetta Emery who was the only provider available during the time of report. Dr. Wynetta Emery was willing to see patient same day to address the results and next step for patient care.

## 2018-12-13 NOTE — Progress Notes (Signed)
Patient ID: Tammy [name], female    DOB: [birth year]  MRN: [id number]  CC: Left leg pain and swelling Subjective: Tammy [name] is a 53 y.o. female who presents for same-day visit.  Patient has appointment to establish care with Dr. Chapman Fitch next week but was put in with me today because of the results of Doppler ultrasound of the lower extremity that was done today Her concerns today include:   Patient seen by PA yesterday with complaint of left calf and shin pain with bruising and swelling.  She notes tenderness in the superficial veins that are located anterior and laterally on the lower leg.  Patient reports having a seizure the end of December and several days later she noted some bruising on the left leg with swelling.  Denies any previous history of blood clots.  She had a grandmother and a grand aunt who had blood clots.  Doppler ultrasound today revealed negative DVT but acute superficial thrombus of GSV that extends from the proximal calf up to 1 cm from SFJ/CFV.  Blood pressure noted to be elevated today.  She has history of elevated blood pressure and states she was told before that she needs to be on medication but she has been trying to control it with limiting salt.  She denies any headaches. Patient Active Problem List   Diagnosis Date Noted  . Chronic migraine 09/07/2016  . Seizures ([place]) 08/21/2016  . Seizure ([place]) 08/20/2016  . Asthma 08/20/2016  . Nausea and vomiting 08/20/2016  . Elevated blood pressure 08/20/2016  . DENTAL PAIN 02/12/2009  . DVT, HX OF 01/20/2008  . FIBROIDS, UTERUS 01/16/2008  . COUGH 01/16/2008  . EMBOLISM/THROMBOSIS, DEEP VSL LWR EXTRM NOS 06/03/2007     Current Outpatient Medications on File Prior to Visit  Medication Sig Dispense Refill  . acetaminophen (TYLENOL) 500 MG tablet Take 2 tablets (1,000 mg total) by mouth every 8 (eight) hours as needed. 30 tablet 0  . albuterol (PROVENTIL HFA;VENTOLIN HFA) 108 (90 Base) MCG/ACT inhaler Inhale 2  puffs into the lungs every 6 (six) hours as needed for wheezing or shortness of breath. 1 Inhaler 2  . divalproex (DEPAKOTE ER) 500 MG 24 hr tablet Take 2 tablets (1,000 mg total) by mouth daily for 30 days. 60 tablet 5  . SUMAtriptan (IMITREX) 50 MG tablet Take 1 tablet (50 mg total) by mouth every 2 (two) hours as needed for migraine. May repeat in 2 hours if headache persists or recurs. 10 tablet 2   No current facility-administered medications on file prior to visit.     Allergies  Allergen Reactions  . Dilantin [Phenytoin] Swelling  . Aspirin Other (See Comments)    Patient unsure of reaction   . Tramadol Itching    Pt states she ins unsure of reaction Pt states she ins unsure of reaction    Social History   Socioeconomic History  . Marital status: Single    Spouse name: Not on file  . Number of children: Not on file  . Years of education: Not on file  . Highest education level: Not on file  Occupational History  . Not on file  Social Needs  . Financial resource strain: Not on file  . Food insecurity:    Worry: Not on file    Inability: Not on file  . Transportation needs:    Medical: Not on file    Non-medical: Not on file  Tobacco Use  . Smoking status: Never Smoker  .  Smokeless tobacco: Never Used  Substance and Sexual Activity  . Alcohol use: No  . Drug use: Yes    Types: Marijuana  . Sexual activity: Not on file  Lifestyle  . Physical activity:    Days per week: Not on file    Minutes per session: Not on file  . Stress: Not on file  Relationships  . Social connections:    Talks on phone: Not on file    Gets together: Not on file    Attends religious service: Not on file    Active member of club or organization: Not on file    Attends meetings of clubs or organizations: Not on file    Relationship status: Not on file  . Intimate partner violence:    Fear of current or ex partner: Not on file    Emotionally abused: Not on file    Physically abused:  Not on file    Forced sexual activity: Not on file  Other Topics Concern  . Not on file  Social History Narrative  . Not on file    Family History  Problem Relation Age of Onset  . Heart disease Mother   . Migraines Mother   . Heart attack Father     Past Surgical History:  Procedure Laterality Date  . HERNIA REPAIR    . UTERINE FIBROID SURGERY      ROS: Review of Systems Neuro: Seen in the emergency room at [facility] the end of December with seizure episode.  Valproic acid level was low at 26.  She received a loading dose of this medication and was told to continue her current dose of 500 mg twice a day.  She reports compliance with her medication.  She has not had any further seizures. PHYSICAL EXAM: BP (!) 168/112   Pulse 71   Temp 97.6 F (36.4 C) (Oral)   Resp 18   Ht 5\' 8"  (1.727 m)   Wt 221 lb (100.2 kg)   LMP 03/08/2016   SpO2 99%   BMI 33.60 kg/m   Physical Exam  General appearance - alert, well appearing, middle-aged African-American female and in no distress Mental status - normal mood, behavior, speech, dress, motor activity, and thought processes Chest - clear to auscultation, no wheezes, rales or rhonchi, symmetric air entry Heart - normal rate, regular rhythm, normal S1, S2, no murmurs, rubs, clicks or gallops Extremities -left calf is notably larger than the right.  She has prominent superficial veins that are tender to touch over the shin extending medially.  Varicose veins noted on both legs.  She has mild bilateral lower extremity edema.  ASSESSMENT AND PLAN: 1. Acute superficial venous thrombosis of lower extremity, left Given that this clot extends very close to the deep veins, I recommend that we start her on an anticoagulant in the form of Xarelto 10 mg daily for at least 6 weeks.  At that time a repeat ultrasound can be done.  Stressed the importance of compliance with the medication.  Advised to observe for any excessive bruising and to come in  if she has any bleeding. -Discussed the importance of taking her seizure medicine consistently every day to try to avoid having a seizure while on blood thinner.  She expressed understanding. -Patient given a voucher for Xarelto which she will get at an outside pharmacy.  She can then get subsequent refill at our pharmacy.  2. Essential hypertension DASH diet discussed and encouraged.  Start lisinopril/HCTZ.  She  will keep the follow-up appointment with Dr. Chapman Fitch in about 2 weeks to establish with her    Patient was given the opportunity to ask questions.  Patient verbalized understanding of the plan and was able to repeat key elements of the plan.   No orders of the defined types were placed in this encounter.    Requested Prescriptions   Signed Prescriptions Disp Refills  . rivaroxaban (XARELTO) 10 MG TABS tablet 30 tablet 0    Sig: Take 1 tablet (10 mg total) by mouth daily.  Marland Kitchen lisinopril-hydrochlorothiazide (PRINZIDE,ZESTORETIC) 10-12.5 MG tablet 30 tablet 1    Sig: Take 1 tablet by mouth daily.    Return in about 2 weeks (around 12/27/2018) for [place].  Karle Plumber, MD, FACP

## 2018-12-14 NOTE — Telephone Encounter (Signed)
On-call note reviewed.  Okay for pt to get the 15 mg tab of Xarelto for now as it is the wkend.  Message sent to our clinical pharmacist.

## 2018-12-14 NOTE — Telephone Encounter (Signed)
reviewed

## 2018-12-16 ENCOUNTER — Telehealth: Payer: Self-pay | Admitting: Internal Medicine

## 2018-12-16 ENCOUNTER — Ambulatory Visit: Payer: Self-pay | Admitting: Family Medicine

## 2018-12-16 NOTE — Progress Notes (Signed)
Report was called in and was reported differently. MA shared report with MD in office who saw the patient same day and had her evaluated by CPP (over coumadin clinic) to start patient on the right plan of care.

## 2018-12-16 NOTE — Telephone Encounter (Signed)
Phone call placed to patient this morning as follow-up from the on-call note that I received from the NP.  Patient reports that the coupon that was given her had expired for the 10 mg tablet but she was able to get the 15 mg tablet on the coupon.  She was given a 30-day supply.  Patient told to go ahead and take the 15 mg tablet and I will speak with our clinical pharmacist to let her know when the 10 mg tablet is available.  However I reminded her that she will be on the anticoagulant for only about 6 weeks total.

## 2019-01-03 ENCOUNTER — Ambulatory Visit: Payer: Self-pay | Admitting: Family Medicine

## 2019-01-03 ENCOUNTER — Telehealth: Payer: Self-pay | Admitting: Family Medicine

## 2019-01-03 NOTE — Telephone Encounter (Signed)
Patient is concerned that she can not get her blood thinners and cannot afford their medications. Patient has not established with a PCP and was not sure who she could contact/speak with.Please follow up .

## 2019-01-03 NOTE — Telephone Encounter (Signed)
Pt states initially she called to reschedule this morning because she ate and was due for fasting lab test.  Advised patient in future to keep appointment discuss with the provider and could bring her back for lab test only that needed to be fasting.  Pt verbalized understanding.   An appointment was scheduled in March.  She states she would be out of  Her medication by then. Appointment was scheduled for on 01/24/2019 at 10:00.  Pt verbalized understanding.

## 2019-01-14 MED FILL — DIVALPROEX SOD ER 500 MG TA: 500 | 30 days supply | Qty: 60 | Fill #4

## 2019-01-15 ENCOUNTER — Other Ambulatory Visit: Payer: Self-pay | Admitting: Pharmacist

## 2019-01-15 ENCOUNTER — Telehealth: Payer: Self-pay | Admitting: Pharmacist

## 2019-01-15 ENCOUNTER — Ambulatory Visit: Payer: Self-pay | Admitting: Family Medicine

## 2019-01-15 MED ORDER — RIVAROXABAN 15 MG PO TABS: 15.0000 mg | ORAL_TABLET | Freq: Every day | ORAL | 0 refills | Status: DC

## 2019-01-15 MED FILL — XARELTO 15 MG TABLET: 15 | 16 days supply | Qty: 16 | Fill #0

## 2019-01-15 NOTE — Telephone Encounter (Signed)
Received notice from [facility] pharmacy that Ms. Pyka has not completed her required paperwork to receive assistance for Xarelto. She is taking Xarelto for an acute superficial venous thrombosis. It was recommended by patient's PCP that she complete 6 weeks of therapy on the 10 mg dose. Our pharmacy does not carry the 10 mg dose, therefore, we gave her a month supply of the 15 mg to allow time for completion of assistance paperwork.  We would need ~1 month to process paperwork and receive the 10 mg tablet. She has already completed 1 month of the 15 mg dose, and we would not have time to get in the 10 mg dose before the end of her treatment duration.   I have provided the pharmacy with another prescription for the 15 mg. Will forward this information to patient's PCP.

## 2019-01-23 ENCOUNTER — Ambulatory Visit: Payer: Self-pay | Admitting: Family Medicine

## 2019-01-24 ENCOUNTER — Ambulatory Visit: Payer: Self-pay | Attending: Family Medicine | Admitting: Family Medicine

## 2019-01-24 VITALS — BP 158/98 | HR 71 | Temp 98.9°F | Resp 18 | Ht 63.0 in | Wt 209.0 lb

## 2019-01-24 DIAGNOSIS — E010 Iodine-deficiency related diffuse (endemic) goiter: Secondary | ICD-10-CM

## 2019-01-24 DIAGNOSIS — I10 Essential (primary) hypertension: Secondary | ICD-10-CM

## 2019-01-24 DIAGNOSIS — I82812 Embolism and thrombosis of superficial veins of left lower extremities: Secondary | ICD-10-CM

## 2019-01-24 MED ORDER — LISINOPRIL-HYDROCHLOROTHIAZIDE 10-12.5 MG PO TABS: 2.0000 | ORAL_TABLET | Freq: Every day | ORAL | 3 refills | Status: DC

## 2019-01-24 MED ORDER — RIVAROXABAN 20 MG PO TABS: 20.0000 mg | ORAL_TABLET | Freq: Every day | ORAL | 4 refills | Status: DC

## 2019-01-24 MED ORDER — RIVAROXABAN 20 MG PO TABS: 20.0000 mg | ORAL_TABLET | Freq: Every day | ORAL | 0 refills | Status: DC

## 2019-01-24 MED FILL — LISINOPRIL-HCTZ 10-12.5 MG: 10-12.5 | 30 days supply | Qty: 60 | Fill #0

## 2019-01-24 NOTE — Progress Notes (Signed)
Subjective:    Patient ID: Tammy [name], female    DOB: 26-Sep-1966, 53 y.o.   MRN: [id number]  HPI       53 yo female who was seen in the office of 12/13/2018 due to new diagnosis of DVT, Hypertension with elevated blood pressure and patient with seizure disorder.  Patient has started the use of Xarelto.  She denies any unusual bruising or bleeding since starting the medication.  Patient denies any bleeding from the gums, no nosebleeds, no blood in the urine and no blood in the stool.  Patient states that her left lower leg is not as painful as it was prior to starting the use of Xarelto.  Patient is not sure if she hit her leg on something when she had a seizure in January.  Patient reports history of seizure disorder and is taking her Depakote.  She does not drive or operate a motor vehicle.  Patient believes that she likely had a seizure secondary to stress.  Patient reports that her nephew was killed last year and his body was left in a cemetery.  Patient's sister had been very stressed out over the death of her son and trying to find out who killed her son but patient's sister passed away very recently from a heart attack.  Patient thinks that her sisters heart attack was likely brought on by stress and grief.  Patient is trying to be there for her 2 nieces.  Patient believes that this is taking a toll on her health as well.  Patient reports that she is taking her blood pressure medication daily however recently her blood pressure has remained elevated.  Patient reports that she feels anxious/nervous as well as grieving for her sister and nephew.  Patient reports that she is not sleeping well.  She does not feel that she needs medication to help with her anxiety and lack of sleep at this time but she will consider contacting the social worker here regarding counseling at some point.  Past Medical History:  Diagnosis Date  . Asthma   . Headache(784.0)   . Hypertension   . Seizures ([place])     Past Surgical History:  Procedure Laterality Date  . HERNIA REPAIR    . UTERINE FIBROID SURGERY     Family History  Problem Relation Age of Onset  . Heart disease Mother   . Migraines Mother   . Heart attack Father    Social History   Tobacco Use  . Smoking status: Never Smoker  . Smokeless tobacco: Never Used  Substance Use Topics  . Alcohol use: No  . Drug use: Yes    Types: Marijuana   Allergies  Allergen Reactions  . Dilantin [Phenytoin] Swelling  . Aspirin Other (See Comments)    Patient unsure of reaction   . Tramadol Itching    Pt states she ins unsure of reaction Pt states she ins unsure of reaction      Review of Systems  Constitutional: Positive for fatigue. Negative for chills and fever.  HENT: Negative for sore throat and trouble swallowing.   Respiratory: Negative for cough and shortness of breath.   Cardiovascular: Positive for leg swelling (improved). Negative for chest pain and palpitations.  Gastrointestinal: Negative for abdominal pain, blood in stool, constipation, diarrhea and nausea.  Endocrine: Negative for polydipsia, polyphagia and polyuria.  Genitourinary: Negative for dysuria and frequency.  Musculoskeletal: Positive for arthralgias (left shin/calf pain-improving).  Neurological: Negative for dizziness and headaches.  Does have a history of migraines but no current headaches  Hematological: Negative for adenopathy. Does not bruise/bleed easily.  Psychiatric/Behavioral: Positive for sleep disturbance. Negative for self-injury and suicidal ideas. The patient is nervous/anxious.        Objective:   Physical Exam BP (!) 158/98 (BP Location: Left Arm, Patient Position: Sitting, Cuff Size: Large)   Pulse 71   Temp 98.9 F (37.2 C) (Oral)   Resp 18   Ht 5\' 3"  (1.6 m)   Wt 209 lb (94.8 kg)   LMP 03/08/2016   SpO2 99%   BMI 37.02 kg/m Nurse's notes and vital signs reviewed General- well-nourished well-developed older female in no  acute distress but became slightly tearful when talking about her sister's recent death and other life stressors Neck- patient with visible as well as palpable enlargement of the thyroid and patient with a right posterior lateral nodule which may represent lymph node.  Neck is otherwise supple Lungs-clear to auscultation bilaterally, breathing is nonlabored Cardiovascular-regular rate and rhythm Abdomen-soft, nontender Back-no CVA tenderness Extremities- patient with mild bilateral varicose veins and mild edema.  Patient with area on the left medial upper shin just below the knee with slightly tender, distended thrombosed superficial varicose veins.  Mild tenderness with palpation of the posterior lower leg below the knee along the upper calf Psych- patient with tearfulness regarding recent death of her sister and a slightly flattened affect but otherwise interacts appropriately and exhibits normal judgment and thought processes       Assessment & Plan:  1. Acute superficial venous thrombosis of lower extremity, left On review of patient's chart, patient was seen on 12/12/2018 secondary to complaint of pain in her left calf and shin along with bruising and swelling which started about 5 days prior to her visit.  Patient was scheduled for Doppler ultrasound to look for possible DVT.  Patient was then seen again on 12/13/2018 in follow-up of her ultrasound results which showed acute superficial thrombus of the great saphenous vein.  Patient was placed on Xarelto for which she was told to remain on this for 6 weeks due to concern that the superficial vein was close to the deep venous system.  Patient was placed on a Xarelto starter pack which she continues to take but the starter pack is only for 4 weeks therefore new prescription provided so that patient can continue an additional 2 weeks after the starter pack for 6-week total.  Patient denies any unusual bruising or bleeding. - rivaroxaban (XARELTO) 20  MG TABS tablet; Take 1 tablet (20 mg total) by mouth daily with supper. Start after completing the started pack  Dispense: 30 tablet; Refill: 0  2. Essential hypertension Blood pressure elevated at most recent visit as well as at today's visit despite being on lisinopril HCTZ 10-12 0.5 therefore patient is to increase the medication to 2 pills daily and return in a few weeks to have her blood pressure rechecked. - lisinopril-hydrochlorothiazide (PRINZIDE,ZESTORETIC) 10-12.5 MG tablet; Take 2 tablets by mouth daily. To lower blood pressure  Dispense: 60 tablet; Refill: 3  3. Thyromegaly Patient with visible enlargement of the thyroid area as well as palpable enlargement.  Patient states that she is noticed this for more than a year and a half but her prior primary care physician at a different practice was unconcerned and told her that she did not need an ultrasound.  Discussed with the patient that I would like to schedule her for an ultrasound for further evaluation.  Hopefully patient has simple goiter and not malignancy as the cause of her continued enlargement of the thyroid and patient also reports that she now has an enlarged nodule in the right posterior neck which she has noticed over the past few weeks. - US THYROID; Future  An After Visit Summary was printed and given to the patient.  Allergies as of 01/24/2019      Reactions   Dilantin [phenytoin] Swelling   Aspirin Other (See Comments)   Patient unsure of reaction    Tramadol Itching   Pt states she ins unsure of reaction Pt states she ins unsure of reaction      Medication List       Accurate as of January 24, 2019 11:59 PM. Always use your most recent med list.        acetaminophen 500 MG tablet Commonly known as:  TYLENOL Take 2 tablets (1,000 mg total) by mouth every 8 (eight) hours as needed.   albuterol 108 (90 Base) MCG/ACT inhaler Commonly known as:  PROVENTIL HFA;VENTOLIN HFA Inhale 2 puffs into the lungs every  6 (six) hours as needed for wheezing or shortness of breath.   divalproex 500 MG 24 hr tablet Commonly known as:  DEPAKOTE ER Take 2 tablets (1,000 mg total) by mouth daily for 30 days.   lisinopril-hydrochlorothiazide 10-12.5 MG tablet Commonly known as:  PRINZIDE,ZESTORETIC Take 2 tablets by mouth daily. To lower blood pressure   rivaroxaban 20 MG Tabs tablet Commonly known as:  XARELTO Take 1 tablet (20 mg total) by mouth daily with supper. Start after completing the started pack   SUMAtriptan 50 MG tablet Commonly known as:  IMITREX Take 1 tablet (50 mg total) by mouth every 2 (two) hours as needed for migraine. May repeat in 2 hours if headache persists or recurs.      Return in about 6 weeks (around 03/07/2019) for blood pressure/thyroid.

## 2019-01-24 NOTE — Patient Instructions (Signed)
DASH Eating Plan  DASH stands for "Dietary Approaches to Stop Hypertension." The DASH eating plan is a healthy eating plan that has been shown to reduce high blood pressure (hypertension). It may also reduce your risk for type 2 diabetes, heart disease, and stroke. The DASH eating plan may also help with weight loss.  What are tips for following this plan?    General guidelines   Avoid eating more than 2,300 mg (milligrams) of salt (sodium) a day. If you have hypertension, you may need to reduce your sodium intake to 1,500 mg a day.   Limit alcohol intake to no more than 1 drink a day for nonpregnant women and 2 drinks a day for men. One drink equals 12 oz of beer, 5 oz of wine, or 1 oz of hard liquor.   Work with your health care provider to maintain a healthy body weight or to lose weight. Ask what an ideal weight is for you.   Get at least 30 minutes of exercise that causes your heart to beat faster (aerobic exercise) most days of the week. Activities may include walking, swimming, or biking.   Work with your health care provider or diet and nutrition specialist (dietitian) to adjust your eating plan to your individual calorie needs.  Reading food labels     Check food labels for the amount of sodium per serving. Choose foods with less than 5 percent of the Daily Value of sodium. Generally, foods with less than 300 mg of sodium per serving fit into this eating plan.   To find whole grains, look for the word "whole" as the first word in the ingredient list.  Shopping   Buy products labeled as "low-sodium" or "no salt added."   Buy fresh foods. Avoid canned foods and premade or frozen meals.  Cooking   Avoid adding salt when cooking. Use salt-free seasonings or herbs instead of table salt or sea salt. Check with your health care provider or pharmacist before using salt substitutes.   Do not fry foods. Cook foods using healthy methods such as baking, boiling, grilling, and broiling instead.   Cook with  heart-healthy oils, such as olive, canola, soybean, or sunflower oil.  Meal planning   Eat a balanced diet that includes:  ? 5 or more servings of fruits and vegetables each day. At each meal, try to fill half of your plate with fruits and vegetables.  ? Up to 6-8 servings of whole grains each day.  ? Less than 6 oz of lean meat, poultry, or fish each day. A 3-oz serving of meat is about the same size as a deck of cards. One egg equals 1 oz.  ? 2 servings of low-fat dairy each day.  ? A serving of nuts, seeds, or beans 5 times each week.  ? Heart-healthy fats. Healthy fats called Omega-3 fatty acids are found in foods such as flaxseeds and coldwater fish, like sardines, salmon, and mackerel.   Limit how much you eat of the following:  ? Canned or prepackaged foods.  ? Food that is high in trans fat, such as fried foods.  ? Food that is high in saturated fat, such as fatty meat.  ? Sweets, desserts, sugary drinks, and other foods with added sugar.  ? Full-fat dairy products.   Do not salt foods before eating.   Try to eat at least 2 vegetarian meals each week.   Eat more home-cooked food and less restaurant, buffet, and fast food.     When eating at a restaurant, ask that your food be prepared with less salt or no salt, if possible.  What foods are recommended?  The items listed may not be a complete list. Talk with your dietitian about what dietary choices are best for you.  Grains  Whole-grain or whole-wheat bread. Whole-grain or whole-wheat pasta. Brown rice. Oatmeal. Quinoa. Bulgur. Whole-grain and low-sodium cereals. Pita bread. Low-fat, low-sodium crackers. Whole-wheat flour tortillas.  Vegetables  Fresh or frozen vegetables (raw, steamed, roasted, or grilled). Low-sodium or reduced-sodium tomato and vegetable juice. Low-sodium or reduced-sodium tomato sauce and tomato paste. Low-sodium or reduced-sodium canned vegetables.  Fruits  All fresh, dried, or frozen fruit. Canned fruit in natural juice (without  added sugar).  Meat and other protein foods  Skinless chicken or turkey. Ground chicken or turkey. Pork with fat trimmed off. Fish and seafood. Egg whites. Dried beans, peas, or lentils. Unsalted nuts, nut butters, and seeds. Unsalted canned beans. Lean cuts of beef with fat trimmed off. Low-sodium, lean deli meat.  Dairy  Low-fat (1%) or fat-free (skim) milk. Fat-free, low-fat, or reduced-fat cheeses. Nonfat, low-sodium ricotta or cottage cheese. Low-fat or nonfat yogurt. Low-fat, low-sodium cheese.  Fats and oils  Soft margarine without trans fats. Vegetable oil. Low-fat, reduced-fat, or light mayonnaise and salad dressings (reduced-sodium). Canola, safflower, olive, soybean, and sunflower oils. Avocado.  Seasoning and other foods  Herbs. Spices. Seasoning mixes without salt. Unsalted popcorn and pretzels. Fat-free sweets.  What foods are not recommended?  The items listed may not be a complete list. Talk with your dietitian about what dietary choices are best for you.  Grains  Baked goods made with fat, such as croissants, muffins, or some breads. Dry pasta or rice meal packs.  Vegetables  Creamed or fried vegetables. Vegetables in a cheese sauce. Regular canned vegetables (not low-sodium or reduced-sodium). Regular canned tomato sauce and paste (not low-sodium or reduced-sodium). Regular tomato and vegetable juice (not low-sodium or reduced-sodium). Pickles. Olives.  Fruits  Canned fruit in a light or heavy syrup. Fried fruit. Fruit in cream or butter sauce.  Meat and other protein foods  Fatty cuts of meat. Ribs. Fried meat. Bacon. Sausage. Bologna and other processed lunch meats. Salami. Fatback. Hotdogs. Bratwurst. Salted nuts and seeds. Canned beans with added salt. Canned or smoked fish. Whole eggs or egg yolks. Chicken or turkey with skin.  Dairy  Whole or 2% milk, cream, and half-and-half. Whole or full-fat cream cheese. Whole-fat or sweetened yogurt. Full-fat cheese. Nondairy creamers. Whipped toppings.  Processed cheese and cheese spreads.  Fats and oils  Butter. Stick margarine. Lard. Shortening. Ghee. Bacon fat. Tropical oils, such as coconut, palm kernel, or palm oil.  Seasoning and other foods  Salted popcorn and pretzels. Onion salt, garlic salt, seasoned salt, table salt, and sea salt. Worcestershire sauce. Tartar sauce. Barbecue sauce. Teriyaki sauce. Soy sauce, including reduced-sodium. Steak sauce. Canned and packaged gravies. Fish sauce. Oyster sauce. Cocktail sauce. Horseradish that you find on the shelf. Ketchup. Mustard. Meat flavorings and tenderizers. Bouillon cubes. Hot sauce and Tabasco sauce. Premade or packaged marinades. Premade or packaged taco seasonings. Relishes. Regular salad dressings.  Where to find more information:   National Heart, Lung, and Blood Institute: www.nhlbi.nih.gov   American Heart Association: www.heart.org  Summary   The DASH eating plan is a healthy eating plan that has been shown to reduce high blood pressure (hypertension). It may also reduce your risk for type 2 diabetes, heart disease, and stroke.   With the   DASH eating plan, you should limit salt (sodium) intake to 2,300 mg a day. If you have hypertension, you may need to reduce your sodium intake to 1,500 mg a day.   When on the DASH eating plan, aim to eat more fresh fruits and vegetables, whole grains, lean proteins, low-fat dairy, and heart-healthy fats.   Work with your health care provider or diet and nutrition specialist (dietitian) to adjust your eating plan to your individual calorie needs.  This information is not intended to replace advice given to you by your health care provider. Make sure you discuss any questions you have with your health care provider.  Document Released: 11/02/2011 Document Revised: 11/06/2016 Document Reviewed: 11/06/2016  Elsevier Interactive Patient Education  2019 [employer]

## 2019-01-25 ENCOUNTER — Encounter: Payer: Self-pay | Admitting: Family Medicine

## 2019-01-31 ENCOUNTER — Ambulatory Visit: Payer: Self-pay | Attending: Family Medicine

## 2019-02-04 ENCOUNTER — Ambulatory Visit: Payer: Self-pay | Admitting: Family Medicine

## 2019-02-04 ENCOUNTER — Ambulatory Visit: Payer: Self-pay | Admitting: Internal Medicine

## 2019-02-10 MED FILL — DIVALPROEX SOD ER 500 MG TA: 500 | 30 days supply | Qty: 60 | Fill #5

## 2019-02-13 ENCOUNTER — Telehealth: Payer: Self-pay | Admitting: Family Medicine

## 2019-02-13 NOTE — Telephone Encounter (Signed)
Pt called in and wanted to ask financial counselor questions about her cafa application

## 2019-02-13 NOTE — Telephone Encounter (Signed)
Unable to speak Pt, line was always busy, Pt need to schedule a new Financial appt with new documents and new application

## 2019-03-24 MED FILL — DIVALPROEX SOD ER 500 MG TA: 500 | 30 days supply | Qty: 60 | Fill #0

## 2019-04-29 MED FILL — DIVALPROEX SOD ER 500 MG TA: 500 | 30 days supply | Qty: 60 | Fill #1

## 2019-06-03 MED FILL — DIVALPROEX SOD ER 500 MG TA: 500 | 30 days supply | Qty: 60 | Fill #2

## 2019-07-07 MED FILL — DIVALPROEX SOD ER 500 MG TA: 500 | 30 days supply | Qty: 60 | Fill #3

## 2019-08-18 MED FILL — DIVALPROEX SOD ER 500 MG TA: 500 | 30 days supply | Qty: 60 | Fill #4

## 2019-09-01 ENCOUNTER — Ambulatory Visit: Payer: Self-pay | Admitting: Internal Medicine

## 2019-09-09 ENCOUNTER — Encounter (HOSPITAL_BASED_OUTPATIENT_CLINIC_OR_DEPARTMENT_OTHER): Payer: Self-pay | Admitting: Emergency Medicine

## 2019-09-09 ENCOUNTER — Emergency Department (HOSPITAL_BASED_OUTPATIENT_CLINIC_OR_DEPARTMENT_OTHER)
Admission: EM | Admit: 2019-09-09 | Discharge: 2019-09-09 | Disposition: A | Discharge status: Home / Self Care | Payer: Self-pay | Attending: Emergency Medicine | Admitting: Emergency Medicine

## 2019-09-09 ENCOUNTER — Other Ambulatory Visit: Payer: Self-pay

## 2019-09-09 DIAGNOSIS — I10 Essential (primary) hypertension: Secondary | ICD-10-CM | POA: Insufficient documentation

## 2019-09-09 DIAGNOSIS — Z79899 Other long term (current) drug therapy: Secondary | ICD-10-CM | POA: Insufficient documentation

## 2019-09-09 DIAGNOSIS — L723 Sebaceous cyst: Secondary | ICD-10-CM | POA: Insufficient documentation

## 2019-09-09 DIAGNOSIS — J45909 Unspecified asthma, uncomplicated: Secondary | ICD-10-CM | POA: Insufficient documentation

## 2019-09-09 DIAGNOSIS — L089 Local infection of the skin and subcutaneous tissue, unspecified: Secondary | ICD-10-CM

## 2019-09-09 MED ORDER — SULFAMETHOXAZOLE-TRIMETHOPRIM 800-160 MG PO TABS: 1.0000 | ORAL_TABLET | Freq: Two times a day (BID) | ORAL | 0 refills | Status: AC

## 2019-09-09 MED FILL — SULFAMETHOXAZOLE-TMP DS TAB: 800-160 | 7 days supply | Qty: 14 | Fill #0

## 2019-09-09 NOTE — ED Provider Notes (Signed)
[facility] EMERGENCY DEPARTMENT Provider Note   CSN: [id number] Arrival date & time: 09/09/19  1103     History   Chief Complaint Chief Complaint  Patient presents with  . Skin Problem    HPI [name] Tammy [name] is a 53 y.o. female.     Patient presents the emergency department with a draining lump between the upper portions of her breasts.  Patient states that this has been present for approximately 1 week.  It has been draining spontaneously and with manual pressure applied at home.  Last drainage just prior to arrival.  Patient reports foul odor.  No nausea or vomiting.  No fevers or chills.  No history of diabetes or immunocompromise.  No history of similar lesion in this area.     Past Medical History:  Diagnosis Date  . Asthma   . Headache(784.0)   . Hypertension   . Seizures [facility])     Patient Active Problem List   Diagnosis Date Noted  . Acute superficial venous thrombosis of lower extremity, left 12/13/2018  . Essential hypertension 12/13/2018  . Chronic migraine 09/07/2016  . Seizures ([place]) 08/21/2016  . Seizure ([place]) 08/20/2016  . Asthma 08/20/2016  . Nausea and vomiting 08/20/2016  . Elevated blood pressure 08/20/2016  . DENTAL PAIN 02/12/2009  . DVT, HX OF 01/20/2008  . FIBROIDS, UTERUS 01/16/2008  . COUGH 01/16/2008  . EMBOLISM/THROMBOSIS, DEEP VSL LWR EXTRM NOS 06/03/2007    Past Surgical History:  Procedure Laterality Date  . HERNIA REPAIR    . UTERINE FIBROID SURGERY       OB History    Gravida  3   Para      Term      Preterm      AB  1   Living  2     SAB  1   TAB  0   Ectopic      Multiple      Live Births               Home Medications    Prior to Admission medications   Medication Sig Start Date End Date Taking? Authorizing Provider  acetaminophen (TYLENOL) 500 MG tablet Take 2 tablets (1,000 mg total) by mouth every 8 (eight) hours as needed. 06/20/17   Alfonse Spruce, FNP  albuterol  (PROVENTIL HFA;VENTOLIN HFA) 108 (90 Base) MCG/ACT inhaler Inhale 2 puffs into the lungs every 6 (six) hours as needed for wheezing or shortness of breath. 06/20/17   Alfonse Spruce, FNP  divalproex (DEPAKOTE ER) 500 MG 24 hr tablet Take 2 tablets (1,000 mg total) by mouth daily for 30 days. 12/12/18 01/11/19  Argentina Donovan, PA-C  lisinopril-hydrochlorothiazide (PRINZIDE,ZESTORETIC) 10-12.5 MG tablet Take 2 tablets by mouth daily. To lower blood pressure 01/24/19   Fulp, Cammie, MD  rivaroxaban (XARELTO) 20 MG TABS tablet Take 1 tablet (20 mg total) by mouth daily with supper. Start after completing the started pack 01/24/19   Fulp, Cammie, MD  sulfamethoxazole-trimethoprim (BACTRIM DS) 800-160 MG tablet Take 1 tablet by mouth 2 (two) times daily for 7 days. 09/09/19 09/16/19  Carlisle Cater, PA-C  SUMAtriptan (IMITREX) 50 MG tablet Take 1 tablet (50 mg total) by mouth every 2 (two) hours as needed for migraine. May repeat in 2 hours if headache persists or recurs. 12/12/18   Argentina Donovan, PA-C    Family History Family History  Problem Relation Age of Onset  . Heart disease Mother   .  Migraines Mother   . Heart attack Father     Social History Social History   Tobacco Use  . Smoking status: Never Smoker  . Smokeless tobacco: Never Used  Substance Use Topics  . Alcohol use: No  . Drug use: Yes    Types: Marijuana     Allergies   Dilantin [phenytoin], Aspirin, and Tramadol   Review of Systems Review of Systems  Constitutional: Negative for fever.  Gastrointestinal: Negative for nausea and vomiting.  Skin: Negative for color change.       Positive for abscess, small amount of white-colored purulent drainage.  Foul odor.  Hematological: Negative for adenopathy.     Physical Exam Updated Vital Signs BP (!) 181/123   Pulse 77   Temp 98.4 F (36.9 C) (Oral)   Resp 16   Ht 5\' 3"  (1.6 m)   Wt 92.5 kg   LMP 03/08/2016   SpO2 100%   BMI 36.14 kg/m   Physical  Exam Vitals signs and nursing note reviewed.  Constitutional:      Appearance: She is well-developed.  HENT:     Head: Normocephalic and atraumatic.  Eyes:     Conjunctiva/sclera: Conjunctivae normal.  Neck:     Musculoskeletal: Normal range of motion and neck supple.  Pulmonary:     Effort: No respiratory distress.  Skin:    General: Skin is warm and dry.     Comments: There is a small pea-sized nodule noted between the breasts on the superior aspect.    Neurological:     Mental Status: She is alert.      ED Treatments / Results  Labs (all labs ordered are listed, but only abnormal results are displayed) Labs Reviewed - No data to display  EKG None  Radiology No results found.  Procedures Procedures (including critical care time)  Medications Ordered in ED Medications - No data to display   Initial Impression / Assessment and Plan / ED Course  I have reviewed the triage vital signs and the nursing notes.  Pertinent labs & imaging results that were available during my care of the patient were reviewed by me and considered in my medical decision making (see chart for details).        Patient seen and examined. We discussed I&D versus continue warm compresses, drainage and antibiotics.  Patient would like to continue on compresses and trial of antibiotics.  Patient prescribed Bactrim.  Vital signs reviewed and are as follows: BP (!) 181/123   Pulse 77   Temp 98.4 F (36.9 C) (Oral)   Resp 16   Ht 5\' 3"  (1.6 m)   Wt 92.5 kg   LMP 03/08/2016   SpO2 100%   BMI 36.14 kg/m   The patient was urged to return to the Emergency Department urgently with worsening pain, swelling, expanding erythema especially if it streaks away from the affected area, fever, or if they have any other concerns.   The patient was urged to return to the Emergency Department or go to their PCP in 48 hours for wound recheck if the area is not significantly improved.  The patient  verbalized understanding and stated agreement with this plan.    Final Clinical Impressions(s) / ED Diagnoses   Final diagnoses:  Infected sebaceous cyst   Patient with a very small sebaceous cyst on her mid chest area with some active drainage.  Suspect infected sebaceous cyst.  The area is already well drained and spontaneously draining.  I  am not sure that incision and drainage at this point would yield better drainage and the area is about the size of a pea.  Patient would like to defer I&D at this time.  Return instructions as above.  ED Discharge Orders         Ordered    sulfamethoxazole-trimethoprim (BACTRIM DS) 800-160 MG tablet  2 times daily     09/09/19 1350           Carlisle Cater, PA-C 09/09/19 1402    Blanchie Dessert, MD 09/09/19 1603

## 2019-09-09 NOTE — Discharge Instructions (Signed)
Please read and follow all provided instructions.  Your diagnoses today include:  1. Infected sebaceous cyst     Tests performed today include:  Vital signs. See below for your results today.   Medications prescribed:   Bactrim (trimethoprim/sulfamethoxazole) - antibiotic  You have been prescribed an antibiotic medicine: take the entire course of medicine even if you are feeling better. Stopping early can cause the antibiotic not to work.  Take any prescribed medications only as directed.   Home care instructions:   Follow any educational materials contained in this packet  Follow-up instructions: Return to the Emergency Department in 48 hours for a recheck if your symptoms are not significantly improved.  Return instructions:  Return to the Emergency Department if you have:  Fever  Worsening symptoms  Worsening pain  Worsening swelling  Redness of the skin that moves away from the affected area, especially if it streaks away from the affected area   Any other emergent concerns  Your vital signs today were: BP (!) 181/123    Pulse 77    Temp 98.4 F (36.9 C) (Oral)    Resp 16    Ht 5\' 3"  (1.6 m)    Wt 92.5 kg    LMP 03/08/2016    SpO2 100%    BMI 36.14 kg/m  If your blood pressure (BP) was elevated above 135/85 this visit, please have this repeated by your doctor within one month. --------------

## 2019-09-09 NOTE — ED Triage Notes (Addendum)
Pt having skin infection to left breast for one week.  No known fever.  Pt states it is draining foul smelling drainage.  Pt has appt with pcp in two days.

## 2019-09-11 ENCOUNTER — Ambulatory Visit: Payer: Self-pay | Admitting: Family Medicine

## 2019-09-17 MED FILL — DIVALPROEX SOD ER 500 MG TA: 500 | 30 days supply | Qty: 60 | Fill #5

## 2019-10-27 ENCOUNTER — Telehealth: Payer: Self-pay | Admitting: Family Medicine

## 2019-10-27 NOTE — Telephone Encounter (Signed)
Patient called requesting a med refill on divalproex (DEPAKOTE ER) 500 MG 24 hr tablet  Patient states she is out and would like medication until her appt on 12/9. Please f/u

## 2019-10-28 NOTE — Telephone Encounter (Signed)
Denied.

## 2019-10-30 ENCOUNTER — Emergency Department (HOSPITAL_BASED_OUTPATIENT_CLINIC_OR_DEPARTMENT_OTHER)
Admission: EM | Admit: 2019-10-30 | Discharge: 2019-10-30 | Disposition: A | Discharge status: Home / Self Care | Payer: Self-pay | Attending: Emergency Medicine | Admitting: Emergency Medicine

## 2019-10-30 ENCOUNTER — Encounter (HOSPITAL_BASED_OUTPATIENT_CLINIC_OR_DEPARTMENT_OTHER): Payer: Self-pay | Admitting: *Deleted

## 2019-10-30 ENCOUNTER — Other Ambulatory Visit: Payer: Self-pay

## 2019-10-30 ENCOUNTER — Emergency Department (HOSPITAL_BASED_OUTPATIENT_CLINIC_OR_DEPARTMENT_OTHER): Payer: Self-pay

## 2019-10-30 DIAGNOSIS — G40909 Epilepsy, unspecified, not intractable, without status epilepticus: Secondary | ICD-10-CM | POA: Insufficient documentation

## 2019-10-30 DIAGNOSIS — Z79899 Other long term (current) drug therapy: Secondary | ICD-10-CM | POA: Insufficient documentation

## 2019-10-30 DIAGNOSIS — J45909 Unspecified asthma, uncomplicated: Secondary | ICD-10-CM | POA: Insufficient documentation

## 2019-10-30 DIAGNOSIS — R569 Unspecified convulsions: Secondary | ICD-10-CM

## 2019-10-30 DIAGNOSIS — I10 Essential (primary) hypertension: Secondary | ICD-10-CM | POA: Insufficient documentation

## 2019-10-30 DIAGNOSIS — R221 Localized swelling, mass and lump, neck: Secondary | ICD-10-CM | POA: Insufficient documentation

## 2019-10-30 LAB — COMPREHENSIVE METABOLIC PANEL
ALT: 10 U/L (ref 0–44)
AST: 17 U/L (ref 15–41)
Albumin: 4 g/dL (ref 3.5–5.0)
Alkaline Phosphatase: 41 U/L (ref 38–126)
Anion gap: 8 (ref 5–15)
BUN: 17 mg/dL (ref 6–20)
CO2: 26 mmol/L (ref 22–32)
Calcium: 9.3 mg/dL (ref 8.9–10.3)
Chloride: 104 mmol/L (ref 98–111)
Creatinine, Ser: 0.77 mg/dL (ref 0.44–1.00)
GFR calc Af Amer: >60 mL/min (ref >60)
GFR calc non Af Amer: >60 mL/min (ref >60)
Glucose, Bld: 82 mg/dL (ref 70–99)
Potassium: 3.8 mmol/L (ref 3.5–5.1)
Sodium: 138 mmol/L (ref 135–145)
Total Bilirubin: 0.6 mg/dL (ref 0.3–1.2)
Total Protein: 7.6 g/dL (ref 6.5–8.1)

## 2019-10-30 LAB — CBC WITH DIFFERENTIAL/PLATELET
Abs Immature Granulocytes: 0.02 10*3/uL (ref 0.00–0.07)
Basophils Absolute: 0 10*3/uL (ref 0.0–0.1)
Basophils Relative: 1 %
Eosinophils Absolute: 0.1 10*3/uL (ref 0.0–0.5)
Eosinophils Relative: 2 %
HCT: 40.5 % (ref 36.0–46.0)
Hemoglobin: 12.8 g/dL (ref 12.0–15.0)
Immature Granulocytes: 0 %
Lymphocytes Relative: 31 %
Lymphs Abs: 2.1 10*3/uL (ref 0.7–4.0)
MCH: 29 pg (ref 26.0–34.0)
MCHC: 31.6 g/dL (ref 30.0–36.0)
MCV: 91.6 fL (ref 80.0–100.0)
Monocytes Absolute: 0.5 10*3/uL (ref 0.1–1.0)
Monocytes Relative: 8 %
Neutro Abs: 3.9 10*3/uL (ref 1.7–7.7)
Neutrophils Relative %: 58 %
Platelets: 197 10*3/uL (ref 150–400)
RBC: 4.42 MIL/uL (ref 3.87–5.11)
RDW: 13.6 % (ref 11.5–15.5)
WBC: 6.7 10*3/uL (ref 4.0–10.5)
nRBC: 0 % (ref 0.0–0.2)

## 2019-10-30 LAB — T4, FREE: Free T4: 0.89 ng/dL (ref 0.61–1.12)

## 2019-10-30 LAB — VALPROIC ACID LEVEL: Valproic Acid Lvl: 67 ug/mL (ref 50.0–100.0)

## 2019-10-30 LAB — TSH: TSH: 1.73 u[IU]/mL (ref 0.350–4.500)

## 2019-10-30 MED ORDER — DIVALPROEX SODIUM ER 250 MG PO TB24
500.0000 mg | ORAL_TABLET | Freq: Every day | ORAL | Status: DC
  Administered 2019-10-30: 17:00:00 500 mg via ORAL
  Filled 2019-10-30: qty 2

## 2019-10-30 MED ORDER — DIVALPROEX SODIUM ER 500 MG PO TB24: 1000.0000 mg | ORAL_TABLET | Freq: Every day | ORAL | 0 refills | Status: DC

## 2019-10-30 NOTE — ED Notes (Signed)
Pt ambulatory to [facility]- no complaints at this time- steady gait.

## 2019-10-30 NOTE — ED Provider Notes (Signed)
[facility] EMERGENCY DEPARTMENT Provider Note   CSN: [id number] Arrival date & time: 10/30/19  1541     History   Chief Complaint Chief Complaint  Patient presents with  . Seizures    HPI [name] Tammy [name] is a 53 y.o. female.     Patient with history of seizure disorder on Depakote here with suspected seizure last night.  States she found that she bit her tongue this morning and was incontinent of urine assumed that she had a seizure.  Her last seizure was about 2 months ago.  States she missed her nighttime dose of Depakote and this morning because she is out.  Otherwise she has not missed any.  No fever or recent illness.  No chest pain, shortness of breath, no abdominal pain, no nausea or vomiting. She did not fall of bed or hit her head. She is no longer on Xarelto though is still on her medication list.  The history is provided by the patient.  Seizures   Past Medical History:  Diagnosis Date  . Asthma   . Headache(784.0)   . Hypertension   . Seizures [facility])     Patient Active Problem List   Diagnosis Date Noted  . Acute superficial venous thrombosis of lower extremity, left 12/13/2018  . Essential hypertension 12/13/2018  . Chronic migraine 09/07/2016  . Seizures ([place]) 08/21/2016  . Seizure ([place]) 08/20/2016  . Asthma 08/20/2016  . Nausea and vomiting 08/20/2016  . Elevated blood pressure 08/20/2016  . DENTAL PAIN 02/12/2009  . DVT, HX OF 01/20/2008  . FIBROIDS, UTERUS 01/16/2008  . COUGH 01/16/2008  . EMBOLISM/THROMBOSIS, DEEP VSL LWR EXTRM NOS 06/03/2007    Past Surgical History:  Procedure Laterality Date  . HERNIA REPAIR    . UTERINE FIBROID SURGERY       OB History    Gravida  3   Para      Term      Preterm      AB  1   Living  2     SAB  1   TAB  0   Ectopic      Multiple      Live Births               Home Medications    Prior to Admission medications   Medication Sig Start Date End Date Taking?  Authorizing Provider  acetaminophen (TYLENOL) 500 MG tablet Take 2 tablets (1,000 mg total) by mouth every 8 (eight) hours as needed. 06/20/17   Alfonse Spruce, FNP  albuterol (PROVENTIL HFA;VENTOLIN HFA) 108 (90 Base) MCG/ACT inhaler Inhale 2 puffs into the lungs every 6 (six) hours as needed for wheezing or shortness of breath. 06/20/17   Alfonse Spruce, FNP  divalproex (DEPAKOTE ER) 500 MG 24 hr tablet Take 2 tablets (1,000 mg total) by mouth daily for 30 days. 12/12/18 01/11/19  Argentina Donovan, PA-C  lisinopril-hydrochlorothiazide (PRINZIDE,ZESTORETIC) 10-12.5 MG tablet Take 2 tablets by mouth daily. To lower blood pressure 01/24/19   Fulp, Cammie, MD  rivaroxaban (XARELTO) 20 MG TABS tablet Take 1 tablet (20 mg total) by mouth daily with supper. Start after completing the started pack 01/24/19   Fulp, Cammie, MD  SUMAtriptan (IMITREX) 50 MG tablet Take 1 tablet (50 mg total) by mouth every 2 (two) hours as needed for migraine. May repeat in 2 hours if headache persists or recurs. 12/12/18   Argentina Donovan, PA-C    Family History Family History  Problem Relation Age of Onset  . Heart disease Mother   . Migraines Mother   . Heart attack Father     Social History Social History   Tobacco Use  . Smoking status: Never Smoker  . Smokeless tobacco: Never Used  Substance Use Topics  . Alcohol use: No  . Drug use: Yes    Types: Marijuana     Allergies   Dilantin [phenytoin], Aspirin, and Tramadol   Review of Systems Review of Systems  Constitutional: Negative for activity change, appetite change and fever.  HENT: Negative for congestion.   Eyes: Negative for visual disturbance.  Respiratory: Negative for cough, chest tightness and shortness of breath.   Gastrointestinal: Negative for abdominal pain, nausea and vomiting.  Genitourinary: Negative for dysuria, hematuria, vaginal bleeding and vaginal discharge.  Musculoskeletal: Negative for arthralgias and myalgias.   Skin: Positive for wound.  Neurological: Positive for seizures. Negative for dizziness, numbness and headaches.   all other systems are negative except as noted in the HPI and PMH.     Physical Exam Updated Vital Signs BP (!) 135/100 (BP Location: Right Arm)   Pulse 77   Temp 98.3 F (36.8 C) (Oral)   Resp 18   Ht 5\' 3"  (1.6 m)   Wt 88.5 kg   LMP 03/08/2016   SpO2 98%   BMI 34.54 kg/m   Physical Exam Vitals signs and nursing note reviewed.  Constitutional:      General: She is not in acute distress.    Appearance: She is well-developed. She is obese.  HENT:     Head: Normocephalic and atraumatic.     Mouth/Throat:     Pharynx: No oropharyngeal exudate.     Comments: Positive tongue biting Eyes:     Conjunctiva/sclera: Conjunctivae normal.     Pupils: Pupils are equal, round, and reactive to light.  Neck:     Musculoskeletal: Normal range of motion and neck supple.     Comments: No meningismus.  Enlarged anterior neck mass that is nontender, no erythema or fluctuance. Cardiovascular:     Rate and Rhythm: Normal rate and regular rhythm.     Heart sounds: Normal heart sounds. No murmur.  Pulmonary:     Effort: Pulmonary effort is normal. No respiratory distress.     Breath sounds: Normal breath sounds.  Abdominal:     Palpations: Abdomen is soft.     Tenderness: There is no abdominal tenderness. There is no guarding or rebound.  Musculoskeletal: Normal range of motion.        General: No tenderness.  Skin:    General: Skin is warm.     Capillary Refill: Capillary refill takes less than 2 seconds.  Neurological:     General: No focal deficit present.     Mental Status: She is alert and oriented to person, place, and time. Mental status is at baseline.     Cranial Nerves: No cranial nerve deficit.     Motor: No abnormal muscle tone.     Coordination: Coordination normal.     Comments: CN 2-12 intact, no ataxia on finger to nose, no nystagmus, 5/5 strength  throughout, no pronator drift, Romberg negative, normal gait.   Psychiatric:        Behavior: Behavior normal.      ED Treatments / Results  Labs (all labs ordered are listed, but only abnormal results are displayed) Labs Reviewed  CBC WITH DIFFERENTIAL/PLATELET  COMPREHENSIVE METABOLIC PANEL  TSH  T4, FREE  VALPROIC ACID LEVEL    EKG EKG Interpretation  Date/Time:  Thursday October 30 2019 16:03:42 EST Ventricular Rate:  72 PR Interval:    QRS Duration: 90 QT Interval:  387 QTC Calculation: 424 R Axis:   -2 Text Interpretation: Sinus rhythm Probable left atrial enlargement Borderline T abnormalities, lateral leads No significant change was found Confirmed by Ezequiel Essex [phone]) on 10/30/2019 4:11:12 PM   Radiology No results found.  Procedures Procedures (including critical care time)  Medications Ordered in ED Medications  divalproex (DEPAKOTE ER) 24 hr tablet 500 mg (has no administration in time range)     Initial Impression / Assessment and Plan / ED Course  I have reviewed the triage vital signs and the nursing notes.  Pertinent labs & imaging results that were available during my care of the patient were reviewed by me and considered in my medical decision making (see chart for details).       Suspected seizure with known seizure disorder.  Denies head trauma.  She is given a dose of her Depakote.  Nonfocal neurological exam.  Does have a large neck mass concerning for goiter.  She is never had this addressed.  She denies any difficulty breathing or difficulty swallowing.  Depakote level therapeutic at 67.  Will restart home medications at previous dosage.  Patient appears to be at her baseline without evidence of traumatic injury.  She is willing to proceed with ultrasound of her thyroid today that was previously not done.  Patient not willing to to wait for results of ultrasound. She understands and will evaluate the results of the ultrasound  on my chart  Discussed there is concern for mass and will refer to surgery for potential biopsy.  We will refill her seizure medication. Return precautions discussed.  Final Clinical Impressions(s) / ED Diagnoses   Final diagnoses:  Neck swelling  Seizure disorder [facility])  Neck mass    ED Discharge Orders    None       Donny Heffern, Annie Main, MD 10/31/19 [phone]

## 2019-10-30 NOTE — Discharge Instructions (Signed)
Take your seizure medication as prescribed and follow-up with your doctor.  He did not want to wait for the results of your ultrasound but you should be able to check this online.  You should see the surgeon because you likely may need to have a biopsy of this to ensure there is nothing cancerous.  Return to the ED with difficulty breathing or difficulty swallowing any concerns.

## 2019-10-30 NOTE — ED Triage Notes (Signed)
Pt c/o unwitnessed seizure last pm with incont and tongue lac.

## 2019-10-30 NOTE — ED Notes (Signed)
Patient transported to Ultrasound 

## 2019-10-31 MED FILL — DIVALPROEX SOD ER 500 MG TA: 500 | 30 days supply | Qty: 60 | Fill #0

## 2019-11-04 NOTE — Progress Notes (Signed)
Patient ID: Tammy [name], female   DOB: 13-Mar-1966, 53 y.o.   MRN: [id number]   Virtual Visit via Telephone Note  I connected with Tammy [name] on 11/05/19 at 10:30 AM EST by telephone and verified that I am speaking with the correct person using two identifiers.   I discussed the limitations, risks, security and privacy concerns of performing an evaluation and management service by telephone and the availability of in person appointments. I also discussed with the patient that there may be a patient responsible charge related to this service. The patient expressed understanding and agreed to proceed.  Patient location:  home My Location:  [facility] office Persons on the call:  Me and the patient  History of Present Illness: After ED 10/30/2019 visit for seizure and thyroid enlargement.  She is also having an occasional folliculitis of her breat that is like an inflamed pimple.  This comes and goes.  She was supposed to have U/S of thyroid ~ February of this year but never went.  Thyroid U/S was done at ED visit and 2 nodules were identified that were larger than previously and biopsy is recommended.    From U/S report: IMPRESSION: 1. Large isthmic nodule measuring up to 4.0 cm. Based on the previous report, this nodule has significantly enlarged since 2015. This nodule meets criteria for ultrasound-guided biopsy. If the nodule has been biopsied in the past, recommend correlation with previous cytology. 2. Left thyroid nodule measuring up to 3.6 cm. This nodule appears to have enlarged based on the prior report. This nodule meets criteria for ultrasound-guided biopsy. If the nodule has been biopsied in the past, recommend correlation with previous cytology.   From A/P: Suspected seizure with known seizure disorder.  Denies head trauma.  She is given a dose of her Depakote.  Nonfocal neurological exam.  Does have a large neck mass concerning for goiter.  She is never had this  addressed.  She denies any difficulty breathing or difficulty swallowing.  Depakote level therapeutic at 67.  Will restart home medications at previous dosage.  Patient appears to be at her baseline without evidence of traumatic injury.  She is willing to proceed with ultrasound of her thyroid today that was previously not done.  Patient not willing to to wait for results of ultrasound. She understands and will evaluate the results of the ultrasound on my chart  Discussed there is concern for mass and will refer to surgery for potential biopsy.  We will refill her seizure medication. Return precautions discussed   Observations/Objective:  NAD.  A&Ox3   Assessment and Plan: 1. Seizures (HCC) Levels of depakote therapeutic on current dose - divalproex (DEPAKOTE ER) 500 MG 24 hr tablet; Take 2 tablets (1,000 mg total) by mouth daily.  Dispense: 60 tablet; Refill: 3  2. Cellulitis, unspecified cellulitis site - mupirocin ointment (BACTROBAN) 2 %; Apply to AA bid prn  Dispense: 22 g; Refill: 0  3. Multiple thyroid nodules - US Thyroid Biopsy; Future  4. Encounter for examination following treatment at hospital  Follow Up Instructions: See PCP in 2 months;  Sooner if needed   I discussed the assessment and treatment plan with the patient. The patient was provided an opportunity to ask questions and all were answered. The patient agreed with the plan and demonstrated an understanding of the instructions.   The patient was advised to call back or seek an in-person evaluation if the symptoms worsen or if the condition fails to improve as  anticipated.  I provided 15 minutes of non-face-to-face time during this encounter.   Freeman Caldron, PA-C

## 2019-11-05 ENCOUNTER — Ambulatory Visit: Payer: Self-pay | Attending: Family Medicine | Admitting: Physician Assistant

## 2019-11-05 ENCOUNTER — Other Ambulatory Visit: Payer: Self-pay

## 2019-11-05 DIAGNOSIS — E042 Nontoxic multinodular goiter: Secondary | ICD-10-CM

## 2019-11-05 DIAGNOSIS — R569 Unspecified convulsions: Secondary | ICD-10-CM

## 2019-11-05 DIAGNOSIS — L03313 Cellulitis of chest wall: Secondary | ICD-10-CM

## 2019-11-05 DIAGNOSIS — Z09 Encounter for follow-up examination after completed treatment for conditions other than malignant neoplasm: Secondary | ICD-10-CM

## 2019-11-05 DIAGNOSIS — L0390 Cellulitis, unspecified: Secondary | ICD-10-CM

## 2019-11-05 MED ORDER — MUPIROCIN 2 % EX OINT: TOPICAL_OINTMENT | CUTANEOUS | 0 refills | Status: DC

## 2019-11-05 MED ORDER — DIVALPROEX SODIUM ER 500 MG PO TB24: 1000.0000 mg | ORAL_TABLET | Freq: Every day | ORAL | 3 refills | Status: DC

## 2019-11-05 MED FILL — MUPIROCIN 2% OINTMENT: 2 | 10 days supply | Qty: 22 | Fill #0

## 2019-11-11 ENCOUNTER — Ambulatory Visit (HOSPITAL_COMMUNITY): Payer: Self-pay

## 2019-11-24 ENCOUNTER — Other Ambulatory Visit: Payer: Self-pay | Admitting: Physician Assistant

## 2019-11-24 DIAGNOSIS — E042 Nontoxic multinodular goiter: Secondary | ICD-10-CM

## 2019-12-01 MED FILL — DIVALPROEX SOD ER 500 MG TA: 500 | 30 days supply | Qty: 60 | Fill #0

## 2019-12-01 MED FILL — MUPIROCIN 2% OINTMENT: 2 | 10 days supply | Qty: 22 | Fill #0

## 2019-12-04 ENCOUNTER — Ambulatory Visit
Admission: RE | Admit: 2019-12-04 | Discharge: 2019-12-04 | Disposition: A | Discharge status: Home / Self Care | Payer: Self-pay | Source: Ambulatory Visit | Attending: Physician Assistant | Admitting: Physician Assistant

## 2019-12-04 ENCOUNTER — Other Ambulatory Visit (HOSPITAL_COMMUNITY)
Admission: RE | Admit: 2019-12-04 | Discharge: 2019-12-04 | Disposition: A | Discharge status: Home / Self Care | Payer: Self-pay | Source: Ambulatory Visit | Attending: Physician Assistant | Admitting: Physician Assistant

## 2019-12-04 DIAGNOSIS — E042 Nontoxic multinodular goiter: Secondary | ICD-10-CM

## 2019-12-05 LAB — CYTOLOGY - NON PAP

## 2019-12-08 ENCOUNTER — Telehealth: Payer: Self-pay | Admitting: *Deleted

## 2019-12-08 NOTE — Telephone Encounter (Signed)
-----   Message from Argentina Donovan, [place] sent at 12/08/2019 11:49 AM EST ----- The biopsy of your thyroid lesions are benign(no cancer).  Follow-up as planned.  Thank, Freeman Caldron, PA-C

## 2019-12-08 NOTE — Telephone Encounter (Signed)
Patient verified DOB Patient is aware of biopsy being benign and to follow up as planned.

## 2020-01-02 MED FILL — ?DIVALPROEX SOD ER 500 MG T: 500 | 30 days supply | Qty: 60 | Fill #1

## 2020-01-07 ENCOUNTER — Ambulatory Visit: Payer: Self-pay | Admitting: Family Medicine

## 2020-01-23 ENCOUNTER — Ambulatory Visit: Payer: Self-pay | Admitting: Family Medicine

## 2020-02-05 MED FILL — DIVALPROEX SOD ER 500 MG TA: 500 | 30 days supply | Qty: 60 | Fill #2

## 2020-02-06 ENCOUNTER — Ambulatory Visit: Payer: Self-pay | Admitting: Family Medicine

## 2020-03-04 ENCOUNTER — Ambulatory Visit: Payer: Self-pay | Admitting: Family Medicine

## 2020-03-09 ENCOUNTER — Encounter: Payer: Self-pay | Admitting: Family

## 2020-03-09 ENCOUNTER — Ambulatory Visit: Payer: Self-pay | Attending: Family | Admitting: Family

## 2020-03-09 ENCOUNTER — Other Ambulatory Visit: Payer: Self-pay

## 2020-03-09 VITALS — BP 157/80 | HR 70 | Temp 98.0°F | Resp 16 | Ht 62.0 in | Wt 176.0 lb

## 2020-03-09 DIAGNOSIS — I10 Essential (primary) hypertension: Secondary | ICD-10-CM

## 2020-03-09 DIAGNOSIS — R569 Unspecified convulsions: Secondary | ICD-10-CM

## 2020-03-09 MED ORDER — DIVALPROEX SODIUM ER 500 MG PO TB24: 1000.0000 mg | ORAL_TABLET | Freq: Every day | ORAL | 2 refills | Status: DC

## 2020-03-09 MED FILL — DIVALPROEX SOD ER 500 MG TA: 500 | 30 days supply | Qty: 60 | Fill #0

## 2020-03-09 NOTE — Patient Instructions (Addendum)
Continue Depakote. Collect valproic lab during today's visit. Follow-up with primary doctor in 3 months. Seizure, Adult A seizure is a sudden burst of abnormal electrical activity in the brain. Seizures usually last from 30 seconds to 2 minutes. They can cause many different symptoms. Usually, seizures are not harmful unless they last a long time. What are the causes? Common causes of this condition include:  Fever or infection.  Conditions that affect the brain, such as: ? A brain abnormality that you were born with. ? A brain or head injury. ? Bleeding in the brain. ? A tumor. ? Stroke. ? Brain disorders such as autism or cerebral palsy.  Low blood sugar.  Conditions that are passed from parent to child (are inherited).  Problems with substances, such as: ? Having a reaction to a drug or a medicine. ? Suddenly stopping the use of a substance (withdrawal). In some cases, the cause may not be known. A person who has repeated seizures over time without a clear cause has a condition called epilepsy. What increases the risk? You are more likely to get this condition if you have:  A family history of epilepsy.  Had a seizure in the past.  A brain disorder.  A history of head injury, lack of oxygen at birth, or strokes. What are the signs or symptoms? There are many types of seizures. The symptoms vary depending on the type of seizure you have. Examples of symptoms during a seizure include:  Shaking (convulsions).  Stiffness in the body.  Passing out (losing consciousness).  Head nodding.  Staring.  Not responding to sound or touch.  Loss of bladder control and bowel control. Some people have symptoms right before and right after a seizure happens. Symptoms before a seizure may include:  Fear.  Worry (anxiety).  Feeling like you may vomit (nauseous).  Feeling like the room is spinning (vertigo).  Feeling like you saw or heard something before (dj vu).  Odd  tastes or smells.  Changes in how you see. You may see flashing lights or spots. Symptoms after a seizure happens can include:  Confusion.  Sleepiness.  Headache.  Weakness on one side of the body. How is this treated? Most seizures will stop on their own in under 5 minutes. In these cases, no treatment is needed. Seizures that last longer than 5 minutes will usually need treatment. Treatment can include:  Medicines given through an IV tube.  Avoiding things that are known to cause your seizures. These can include medicines that you take for another condition.  Medicines to treat epilepsy.  Surgery to stop the seizures. This may be needed if medicines do not help. Follow these instructions at home: Medicines  Take over-the-counter and prescription medicines only as told by your doctor.  Do not eat or drink anything that may keep your medicine from working, such as alcohol. Activity  Do not do any activities that would be dangerous if you had another seizure, like driving or swimming. Wait until your doctor says it is safe for you to do them.  If you live in the [place] ask your local DMV (department of motor vehicles) when you can drive.  Get plenty of rest. Teaching others Teach friends and family what to do when you have a seizure. They should:  Lay you on the ground.  Protect your head and body.  Loosen any tight clothing around your neck.  Turn you on your side.  Not hold you down.  Not put  anything into your mouth.  Know whether or not you need emergency care.  Stay with you until you are better.  General instructions  Contact your doctor each time you have a seizure.  Avoid anything that gives you seizures.  Keep a seizure diary. Write down: ? What you think caused each seizure. ? What you remember about each seizure.  Keep all follow-up visits as told by your doctor. This is important. Contact a doctor if:  You have another seizure.  You have  seizures more often.  There is any change in what happens during your seizures.  You keep having seizures with treatment.  You have symptoms of being sick or having an infection. Get help right away if:  You have a seizure that: ? Lasts longer than 5 minutes. ? Is different than seizures you had before. ? Makes it harder to breathe. ? Happens after you hurt your head.  You have any of these symptoms after a seizure: ? Not being able to speak. ? Not being able to use a part of your body. ? Confusion. ? A bad headache.  You have two or more seizures in a row.  You do not wake up right after a seizure.  You get hurt during a seizure. These symptoms may be an emergency. Do not wait to see if the symptoms will go away. Get medical help right away. Call your local emergency services (911 in the [place] Do not drive yourself to the hospital. Summary  Seizures usually last from 30 seconds to 2 minutes. Usually, they are not harmful unless they last a long time.  Do not eat or drink anything that may keep your medicine from working, such as alcohol.  Teach friends and family what to do when you have a seizure.  Contact your doctor each time you have a seizure. This information is not intended to replace advice given to you by your health care provider. Make sure you discuss any questions you have with your health care provider. Document Revised: 01/31/2019 Document Reviewed: 01/31/2019 Elsevier Patient Education  [employer].

## 2020-03-09 NOTE — Progress Notes (Signed)
Here for f /u and meds refills

## 2020-03-09 NOTE — Progress Notes (Signed)
Patient ID: Tammy [name], female    DOB: 1966/06/07  MRN: [id number]  CC: Medication Refill  Subjective: [name] Tammy [name] is a 54 y.o. female with history of  Embolism/thrombosis, chronic migraine, essential hypertension, asthma, nausea and vomiting, uterus fibroids, cough, and seizures who presents for medication refill.  1. SEIZURES FOLLOW-UP: Reports taking Depakote daily as prescribed without any side effects. Reports last seizure was about a week and a half ago and doesn't recall exactly what happened. She denies falling or hitting her head during that time. States she lives alone. Reports most of her seizures happen during the night and that she awakens to find some blood on her pillow where she has bitten her tongue or she will notice that she had urinary incontinence on the floor of the bathroom.Reports when she notices a tired feeling she will more than likely have a seizure soon. Reports does not see a neurologist. Requesting medication refill. Last visit December 2020 with physician assistant Tammy [name]. During that encounter Depakote was continued at current dose and refilled.  Patient Active Problem List   Diagnosis Date Noted  . Acute superficial venous thrombosis of lower extremity, left 12/13/2018  . Essential hypertension 12/13/2018  . Chronic migraine 09/07/2016  . Seizures ([place]) 08/21/2016  . Seizure ([place]) 08/20/2016  . Asthma 08/20/2016  . Nausea and vomiting 08/20/2016  . Elevated blood pressure 08/20/2016  . DENTAL PAIN 02/12/2009  . DVT, HX OF 01/20/2008  . FIBROIDS, UTERUS 01/16/2008  . COUGH 01/16/2008  . EMBOLISM/THROMBOSIS, DEEP VSL LWR EXTRM NOS 06/03/2007     Current Outpatient Medications on File Prior to Visit  Medication Sig Dispense Refill  . acetaminophen (TYLENOL) 500 MG tablet Take 2 tablets (1,000 mg total) by mouth every 8 (eight) hours as needed. 30 tablet 0  . albuterol (PROVENTIL HFA;VENTOLIN HFA) 108 (90 Base) MCG/ACT inhaler Inhale 2  puffs into the lungs every 6 (six) hours as needed for wheezing or shortness of breath. (Patient not taking: Reported on 11/05/2019) 1 Inhaler 2  . divalproex (DEPAKOTE ER) 500 MG 24 hr tablet Take 2 tablets (1,000 mg total) by mouth daily. 60 tablet 3  . lisinopril-hydrochlorothiazide (PRINZIDE,ZESTORETIC) 10-12.5 MG tablet Take 2 tablets by mouth daily. To lower blood pressure (Patient not taking: Reported on 11/05/2019) 60 tablet 3  . mupirocin ointment (BACTROBAN) 2 % Apply to AA bid prn (Patient not taking: Reported on 03/09/2020) 22 g 0  . rivaroxaban (XARELTO) 20 MG TABS tablet Take 1 tablet (20 mg total) by mouth daily with supper. Start after completing the started pack (Patient not taking: Reported on 11/05/2019) 30 tablet 0  . SUMAtriptan (IMITREX) 50 MG tablet Take 1 tablet (50 mg total) by mouth every 2 (two) hours as needed for migraine. May repeat in 2 hours if headache persists or recurs. (Patient not taking: Reported on 11/05/2019) 10 tablet 2   No current facility-administered medications on file prior to visit.    Allergies  Allergen Reactions  . Dilantin [Phenytoin] Swelling  . Aspirin Other (See Comments)    Patient unsure of reaction   . Tramadol Itching and Other (See Comments)    Pt states she ins unsure of reaction Pt states she ins unsure of reaction Pt states she ins unsure of reaction Pt states she ins unsure of reaction    Social History   Socioeconomic History  . Marital status: Single    Spouse name: Not on file  . Number of children: Not on file  .  Years of education: Not on file  . Highest education level: Not on file  Occupational History  . Not on file  Tobacco Use  . Smoking status: Never Smoker  . Smokeless tobacco: Never Used  Substance and Sexual Activity  . Alcohol use: No  . Drug use: Yes    Types: Marijuana  . Sexual activity: Not on file  Other Topics Concern  . Not on file  Social History Narrative  . Not on file   Social  Determinants of Health   Financial Resource Strain:   . Difficulty of Paying Living Expenses:   Food Insecurity:   . Worried About Charity fundraiser in the Last Year:   . Arboriculturist in the Last Year:   Transportation Needs:   . Film/video editor (Medical):   Marland Kitchen Lack of Transportation (Non-Medical):   Physical Activity:   . Days of Exercise per Week:   . Minutes of Exercise per Session:   Stress:   . Feeling of Stress :   Social Connections:   . Frequency of Communication with Friends and Family:   . Frequency of Social Gatherings with Friends and Family:   . Attends Religious Services:   . Active Member of Clubs or Organizations:   . Attends Archivist Meetings:   Marland Kitchen Marital Status:   Intimate Partner Violence:   . Fear of Current or Ex-Partner:   . Emotionally Abused:   Marland Kitchen Physically Abused:   . Sexually Abused:     Family History  Problem Relation Age of Onset  . Heart disease Mother   . Migraines Mother   . Heart attack Father     Past Surgical History:  Procedure Laterality Date  . HERNIA REPAIR    . UTERINE FIBROID SURGERY      ROS: Review of Systems Negative except as stated above  PHYSICAL EXAM: BP (!) 157/80 (BP Location: Left Arm)   Pulse 70   Temp 98 F (36.7 C)   Resp 16   Ht 5\' 2"  (1.575 m)   Wt 176 lb (79.8 kg)   LMP 03/08/2016   SpO2 99%   BMI 32.19 kg/m   Physical Exam General appearance - alert, well appearing, and in no distress and oriented to person, place, and time Mental status - alert, oriented to person, place, and time, normal mood, behavior, speech, dress, motor activity, and thought processes Eyes - pupils equal and reactive, extraocular eye movements intact Neck - supple, no significant adenopathy Lymphatics - no palpable lymphadenopathy, no hepatosplenomegaly Chest - clear to auscultation, no wheezes, rales or rhonchi, symmetric air entry, no tachypnea, retractions or cyanosis Heart - normal rate, regular  rhythm, normal S1, S2, no murmurs, rubs, clicks or gallops Neurological - alert, oriented, normal speech, no focal findings or movement disorder noted, cranial nerves II through XII intact, funduscopic exam normal, discs flat and sharp, DTR's normal and symmetric, motor and sensory grossly normal bilaterally, normal muscle tone, no tremors, strength 5/5, Romberg sign negative, normal gait and station  ASSESSMENT AND PLAN: 1. Seizures ([place]): -Depakote continued at current dose and refilled.  -Will collect valproic acid level during today's visit considering patient's recent seizure 1 week ago as well as considering patient does not see neurologist regularly. -Follow-up with primary doctor in 3 months or sooner if needed. -Offered patient [place] financial discount/orange card application. Patient agreeable. - divalproex (DEPAKOTE ER) 500 MG 24 hr tablet; Take 2 tablets (1,000 mg total) by  mouth daily.  Dispense: 60 tablet; Refill: 2 - Valproic acid level  2. Essential hypertension: -Patient did not present today for management of blood pressure however, it was noted that her blood pressure is elevated during today's visit. Reports she does not take blood pressure medications as prescribed because she has too many side effects from it and would rather monitor what she eats and incorporate exercise. -Counseled patient to follow-up with primary doctor as scheduled for this.  Patient was given the opportunity to ask questions.  Patient verbalized understanding of the plan and was able to repeat key elements of the plan. Patient was given clear instructions to go to Emergency Department or return to medical center if symptoms don't improve, worsen, or new problems develop.The patient verbalized understanding.    Requested Prescriptions    No prescriptions requested or ordered in this encounter    Andrian Urbach Zachery Dauer, NP

## 2020-03-10 LAB — VALPROIC ACID LEVEL: Valproic Acid Lvl: 93 ug/mL (ref 50–100)

## 2020-03-10 NOTE — Progress Notes (Signed)
Valproic acid level normal.

## 2020-04-19 MED FILL — DIVALPROEX SOD ER 500 MG TA: 500 | 30 days supply | Qty: 60 | Fill #1

## 2020-05-24 MED FILL — DIVALPROEX SOD ER 500 MG TA: 500 | 30 days supply | Qty: 60 | Fill #2

## 2020-06-09 ENCOUNTER — Ambulatory Visit: Payer: Self-pay | Admitting: Family Medicine

## 2020-06-22 MED FILL — DIVALPROEX SOD ER 500 MG TA: 500 | 30 days supply | Qty: 60 | Fill #3

## 2020-07-26 ENCOUNTER — Telehealth: Payer: Self-pay | Admitting: Physician Assistant

## 2020-07-26 DIAGNOSIS — R569 Unspecified convulsions: Secondary | ICD-10-CM

## 2020-07-26 NOTE — Telephone Encounter (Signed)
Requested medication (s) are due for refill today -yes  Requested medication (s) are on the active medication list -yes  Future visit scheduled -yes  Last refill: 06/22/20  Notes to clinic: Request for non delegated Rx  Requested Prescriptions  Pending Prescriptions Disp Refills   divalproex (DEPAKOTE ER) 500 MG 24 hr tablet [Pharmacy Med Name: DIVALPROEX SOD ER 500 MG TA 500 Tablet] 60 tablet 3    Sig: Take 2 tablets (1,000 mg total) by mouth daily.      Not Delegated - Neurology:  Anticonvulsants - Valproates Failed - 07/26/2020  9:42 AM      Failed - This refill cannot be delegated      Passed - AST in normal range and within 360 days    AST  Date Value Ref Range Status  10/30/2019 17 15 - 41 U/L Final          Passed - ALT in normal range and within 360 days    ALT  Date Value Ref Range Status  10/30/2019 10 0 - 44 U/L Final          Passed - HGB in normal range and within 360 days    Hemoglobin  Date Value Ref Range Status  10/30/2019 12.8 12.0 - 15.0 g/dL Final  01/01/2018 12.4 11.1 - 15.9 g/dL Final   Hemoglobin Other  Date Value Ref Range Status  05/23/2007 0.0 0.0 - 0.0 Final          Passed - PLT in normal range and within 360 days    Platelets  Date Value Ref Range Status  10/30/2019 197 150 - 400 K/uL Final  01/01/2018 281 150 - 379 x10E3/uL Final          Passed - WBC in normal range and within 360 days    WBC  Date Value Ref Range Status  10/30/2019 6.7 4.0 - 10.5 K/uL Final          Passed - HCT in normal range and within 360 days    HCT  Date Value Ref Range Status  10/30/2019 40.5 36 - 46 % Final   Hematocrit  Date Value Ref Range Status  01/01/2018 40.0 34.0 - 46.6 % Final          Passed - Valproic Acid (serum) in normal range and within 360 days    Valproic Acid Lvl  Date Value Ref Range Status  03/09/2020 93 50 - 100 ug/mL Final    Comment:                                    Detection Limit = 4                             <4 indicates None Detected Toxicity may occur at levels of 100-500. Measurements of free unbound valproic acid may improve the assess- ment of clinical response.           Passed - Valid encounter within last 12 months    Recent Outpatient Visits           4 months ago Seizures [facility])   [place], [place], NP   8 months ago Cellulitis, unspecified cellulitis site   [place], [place]   1 year ago Acute superficial venous thrombosis  of lower extremity, left   [place], [place]   1 year ago Acute superficial venous thrombosis of lower extremity, left   [place], MD   1 year ago Chronic migraine   [place], [place] M, [place]       Future Appointments             In 2 weeks Camillia Herter, NP [place]                Requested Prescriptions  Pending Prescriptions Disp Refills   divalproex (DEPAKOTE ER) 500 MG 24 hr tablet [Pharmacy Med Name: DIVALPROEX SOD ER 500 MG TA 500 Tablet] 60 tablet 3    Sig: Take 2 tablets (1,000 mg total) by mouth daily.      Not Delegated - Neurology:  Anticonvulsants - Valproates Failed - 07/26/2020  9:42 AM      Failed - This refill cannot be delegated      Passed - AST in normal range and within 360 days    AST  Date Value Ref Range Status  10/30/2019 17 15 - 41 U/L Final          Passed - ALT in normal range and within 360 days    ALT  Date Value Ref Range Status  10/30/2019 10 0 - 44 U/L Final          Passed - HGB in normal range and within 360 days    Hemoglobin  Date Value Ref Range Status  10/30/2019 12.8 12.0 - 15.0 g/dL Final  01/01/2018 12.4 11.1 - 15.9 g/dL Final   Hemoglobin Other  Date Value Ref Range Status  05/23/2007 0.0 0.0 - 0.0 Final          Passed - PLT  in normal range and within 360 days    Platelets  Date Value Ref Range Status  10/30/2019 197 150 - 400 K/uL Final  01/01/2018 281 150 - 379 x10E3/uL Final          Passed - WBC in normal range and within 360 days    WBC  Date Value Ref Range Status  10/30/2019 6.7 4.0 - 10.5 K/uL Final          Passed - HCT in normal range and within 360 days    HCT  Date Value Ref Range Status  10/30/2019 40.5 36 - 46 % Final   Hematocrit  Date Value Ref Range Status  01/01/2018 40.0 34.0 - 46.6 % Final          Passed - Valproic Acid (serum) in normal range and within 360 days    Valproic Acid Lvl  Date Value Ref Range Status  03/09/2020 93 50 - 100 ug/mL Final    Comment:                                    Detection Limit = 4                            <4 indicates None Detected Toxicity may occur at levels of 100-500. Measurements of free unbound valproic acid may improve the assess- ment of clinical response.           Passed - Valid  encounter within last 12 months    Recent Outpatient Visits           4 months ago Seizures [facility])   [place], [place], NP   8 months ago Cellulitis, unspecified cellulitis site   [place], [place], [place]   1 year ago Acute superficial venous thrombosis of lower extremity, left   [place], [place]   1 year ago Acute superficial venous thrombosis of lower extremity, left   [place], [place]   1 year ago Chronic migraine   [place], [place]ne [place], [place]       Future Appointments             In 2 weeks Camillia Herter, NP [place]

## 2020-07-28 ENCOUNTER — Other Ambulatory Visit: Payer: Self-pay | Admitting: Family Medicine

## 2020-07-28 MED FILL — DIVALPROEX SOD ER 500 MG TA: 500 | 30 days supply | Qty: 60 | Fill #0

## 2020-08-05 NOTE — Telephone Encounter (Signed)
Pt seems frustrated as this med was turned down with reason of needs appt and she made appt and was told refill would be there for PU and states she has cked for two weeks. Wants a refill as has no meds, will be ok if it is just enough to get her to nxt appt wants a call back from nurse at [phone]

## 2020-08-06 MED FILL — DIVALPROEX SOD ER 500 MG TA: 500 | 30 days supply | Qty: 60 | Fill #0

## 2020-08-06 NOTE — Telephone Encounter (Signed)
Rx was refilled and sent to [facility] pharmacy on 07/28/2020

## 2020-08-12 ENCOUNTER — Other Ambulatory Visit: Payer: Self-pay

## 2020-08-12 ENCOUNTER — Ambulatory Visit: Payer: Self-pay | Attending: Family | Admitting: Family

## 2020-08-12 ENCOUNTER — Encounter: Payer: Self-pay | Admitting: Family

## 2020-08-12 ENCOUNTER — Other Ambulatory Visit: Payer: Self-pay | Admitting: Family

## 2020-08-12 VITALS — BP 159/109 | HR 67 | Temp 98.1°F | Resp 16 | Ht 62.5 in | Wt 158.8 lb

## 2020-08-12 DIAGNOSIS — R569 Unspecified convulsions: Secondary | ICD-10-CM

## 2020-08-12 DIAGNOSIS — I10 Essential (primary) hypertension: Secondary | ICD-10-CM

## 2020-08-12 MED ORDER — LISINOPRIL-HYDROCHLOROTHIAZIDE 10-12.5 MG PO TABS: 2.0000 | ORAL_TABLET | Freq: Every day | ORAL | 2 refills | Status: DC

## 2020-08-12 NOTE — Patient Instructions (Addendum)
Continue Depakote for seizures. Lisinopril-Hydrochlorothiazide for high blood pressure. Lab today. Follow-up in 1 month with pharmacist for blood pressure check. Follow-up with primary physician in 3 months or sooner if needed. Hypertension, Adult Hypertension is another name for high blood pressure. High blood pressure forces your heart to work harder to pump blood. This can cause problems over time. There are two numbers in a blood pressure reading. There is a top number (systolic) over a bottom number (diastolic). It is best to have a blood pressure that is below 120/80. Healthy choices can help lower your blood pressure, or you may need medicine to help lower it. What are the causes? The cause of this condition is not known. Some conditions may be related to high blood pressure. What increases the risk?  Smoking.  Having type 2 diabetes mellitus, high cholesterol, or both.  Not getting enough exercise or physical activity.  Being overweight.  Having too much fat, sugar, calories, or salt (sodium) in your diet.  Drinking too much alcohol.  Having long-term (chronic) kidney disease.  Having a family history of high blood pressure.  Age. Risk increases with age.  Race. You may be at higher risk if you are African American.  Gender. Men are at higher risk than women before age 9. After age 43, women are at higher risk than men.  Having obstructive sleep apnea.  Stress. What are the signs or symptoms?  High blood pressure may not cause symptoms. Very high blood pressure (hypertensive crisis) may cause: ? Headache. ? Feelings of worry or nervousness (anxiety). ? Shortness of breath. ? Nosebleed. ? A feeling of being sick to your stomach (nausea). ? Throwing up (vomiting). ? Changes in how you see. ? Very bad chest pain. ? Seizures. How is this treated?  This condition is treated by making healthy lifestyle changes, such as: ? Eating healthy foods. ? Exercising  more. ? Drinking less alcohol.  Your health care provider may prescribe medicine if lifestyle changes are not enough to get your blood pressure under control, and if: ? Your top number is above 130. ? Your bottom number is above 80.  Your personal target blood pressure may vary. Follow these instructions at home: Eating and drinking   If told, follow the DASH eating plan. To follow this plan: ? Fill one half of your plate at each meal with fruits and vegetables. ? Fill one fourth of your plate at each meal with whole grains. Whole grains include whole-wheat pasta, brown rice, and whole-grain bread. ? Eat or drink low-fat dairy products, such as skim milk or low-fat yogurt. ? Fill one fourth of your plate at each meal with low-fat (lean) proteins. Low-fat proteins include fish, chicken without skin, eggs, beans, and tofu. ? Avoid fatty meat, cured and processed meat, or chicken with skin. ? Avoid pre-made or processed food.  Eat less than 1,500 mg of salt each day.  Do not drink alcohol if: ? Your doctor tells you not to drink. ? You are pregnant, may be pregnant, or are planning to become pregnant.  If you drink alcohol: ? Limit how much you use to:  0-1 drink a day for women.  0-2 drinks a day for men. ? Be aware of how much alcohol is in your drink. In the [place] one drink equals one 12 oz bottle of beer (355 mL), one 5 oz glass of wine (148 mL), or one 1 oz glass of hard liquor (44 mL). Lifestyle   Work  with your doctor to stay at a healthy weight or to lose weight. Ask your doctor what the best weight is for you.  Get at least 30 minutes of exercise most days of the week. This may include walking, swimming, or biking.  Get at least 30 minutes of exercise that strengthens your muscles (resistance exercise) at least 3 days a week. This may include lifting weights or doing Pilates.  Do not use any products that contain nicotine or tobacco, such as cigarettes, e-cigarettes,  and chewing tobacco. If you need help quitting, ask your doctor.  Check your blood pressure at home as told by your doctor.  Keep all follow-up visits as told by your doctor. This is important. Medicines  Take over-the-counter and prescription medicines only as told by your doctor. Follow directions carefully.  Do not skip doses of blood pressure medicine. The medicine does not work as well if you skip doses. Skipping doses also puts you at risk for problems.  Ask your doctor about side effects or reactions to medicines that you should watch for. Contact a doctor if you:  Think you are having a reaction to the medicine you are taking.  Have headaches that keep coming back (recurring).  Feel dizzy.  Have swelling in your ankles.  Have trouble with your vision. Get help right away if you:  Get a very bad headache.  Start to feel mixed up (confused).  Feel weak or numb.  Feel faint.  Have very bad pain in your: ? Chest. ? Belly (abdomen).  Throw up more than once.  Have trouble breathing. Summary  Hypertension is another name for high blood pressure.  High blood pressure forces your heart to work harder to pump blood.  For most people, a normal blood pressure is less than 120/80.  Making healthy choices can help lower blood pressure. If your blood pressure does not get lower with healthy choices, you may need to take medicine. This information is not intended to replace advice given to you by your health care provider. Make sure you discuss any questions you have with your health care provider. Document Revised: 07/24/2018 Document Reviewed: 07/24/2018 Elsevier Patient Education  2020 [employer].

## 2020-08-12 NOTE — Progress Notes (Signed)
Patient ID: Tammy [name], female    DOB: Nov 12, 1966  MRN: [id number]  CC: Chronic Conditions Follow-Up  Subjective: Tammy [name] is a 54 y.o. female with history of embolism/thrombosis deep vascular lower extremity, chronic migraine, essential hypertension, and seizures who presents for chronic conditions follow-up.  1. HYPERTENSION FOLLOW-UP: 03/09/2020: Patient with elevated blood pressure. Reports does not take blood pressure medications because she has too many side effects and would rather monitor what she eats and incorporate exercise.  08/12/2020: Currently taking: see medication list Have you taken your blood pressure medication today: []  Yes [x]  No  Med Adherence: []  Yes    [x]  No Medication side effects: []  Yes    []  No Adherence with salt restriction: [x]  Yes    []  No Exercise: Yes [x]  trying Home Monitoring?: []  Yes    [x]  No Smoking [x]  Yes, marijuana SOB? []  Yes    [x]  No Chest Pain?: []  Yes    [x]  No Leg swelling?: [x]  Yes, left leg comes and goes for years  Headaches?: [x]  Yes    []  No Dizziness? []  Yes    [x]  No Comments: Patient reports she has not taken blood pressure medication since before April 2021. Reports she does not like taking pills and doesn't feel that she needs them. Reports she does have difficulty swallowing the pill as well.  2. SEIZURE FOLLOW-UP: 03/09/2020: Continued on Depakote. Valproic acid level obtained.  08/12/2020: Reports last seizure was 08/07/2020 and that she had mini-seizure during her sleep. Reports she had urine in the bed when she awakened and that's how she found out she has a mini-seizure during the night. Denies injury and head trauma. Patient reports she does not see a neurologist. Reports taking Depakote daily without missing doses.    Patient Active Problem List   Diagnosis Date Noted  . Acute superficial venous thrombosis of lower extremity, left 12/13/2018  . Essential hypertension 12/13/2018  . Chronic migraine  09/07/2016  . Seizures ([place]) 08/21/2016  . Seizure ([place]) 08/20/2016  . Asthma 08/20/2016  . Nausea and vomiting 08/20/2016  . Elevated blood pressure 08/20/2016  . DENTAL PAIN 02/12/2009  . DVT, HX OF 01/20/2008  . FIBROIDS, UTERUS 01/16/2008  . COUGH 01/16/2008  . EMBOLISM/THROMBOSIS, DEEP VSL LWR EXTRM NOS 06/03/2007     Current Outpatient Medications on File Prior to Visit  Medication Sig Dispense Refill  . acetaminophen (TYLENOL) 500 MG tablet Take 2 tablets (1,000 mg total) by mouth every 8 (eight) hours as needed. 30 tablet 0  . albuterol (PROVENTIL HFA;VENTOLIN HFA) 108 (90 Base) MCG/ACT inhaler Inhale 2 puffs into the lungs every 6 (six) hours as needed for wheezing or shortness of breath. (Patient not taking: Reported on 11/05/2019) 1 Inhaler 2  . divalproex (DEPAKOTE ER) 500 MG 24 hr tablet Take 2 tablets (1,000 mg total) by mouth daily. 60 tablet 1  . lisinopril-hydrochlorothiazide (PRINZIDE,ZESTORETIC) 10-12.5 MG tablet Take 2 tablets by mouth daily. To lower blood pressure (Patient not taking: Reported on 11/05/2019) 60 tablet 3  . mupirocin ointment (BACTROBAN) 2 % Apply to AA bid prn (Patient not taking: Reported on 03/09/2020) 22 g 0  . rivaroxaban (XARELTO) 20 MG TABS tablet Take 1 tablet (20 mg total) by mouth daily with supper. Start after completing the started pack (Patient not taking: Reported on 11/05/2019) 30 tablet 0  . SUMAtriptan (IMITREX) 50 MG tablet Take 1 tablet (50 mg total) by mouth every 2 (two) hours as needed for migraine. May repeat  in 2 hours if headache persists or recurs. (Patient not taking: Reported on 11/05/2019) 10 tablet 2   No current facility-administered medications on file prior to visit.    Allergies  Allergen Reactions  . Dilantin [Phenytoin] Swelling  . Aspirin Other (See Comments)    Patient unsure of reaction   . Tramadol Itching and Other (See Comments)    Pt states she ins unsure of reaction Pt states she ins unsure of reaction Pt  states she ins unsure of reaction Pt states she ins unsure of reaction    Social History   Socioeconomic History  . Marital status: Single    Spouse name: Not on file  . Number of children: Not on file  . Years of education: Not on file  . Highest education level: Not on file  Occupational History  . Not on file  Tobacco Use  . Smoking status: Never Smoker  . Smokeless tobacco: Never Used  Substance and Sexual Activity  . Alcohol use: No  . Drug use: Yes    Types: Marijuana  . Sexual activity: Not on file  Other Topics Concern  . Not on file  Social History Narrative  . Not on file   Social Determinants of Health   Financial Resource Strain:   . Difficulty of Paying Living Expenses: Not on file  Food Insecurity:   . Worried About Charity fundraiser in the Last Year: Not on file  . Ran Out of Food in the Last Year: Not on file  Transportation Needs:   . Lack of Transportation (Medical): Not on file  . Lack of Transportation (Non-Medical): Not on file  Physical Activity:   . Days of Exercise per Week: Not on file  . Minutes of Exercise per Session: Not on file  Stress:   . Feeling of Stress : Not on file  Social Connections:   . Frequency of Communication with Friends and Family: Not on file  . Frequency of Social Gatherings with Friends and Family: Not on file  . Attends Religious Services: Not on file  . Active Member of Clubs or Organizations: Not on file  . Attends Archivist Meetings: Not on file  . Marital Status: Not on file  Intimate Partner Violence:   . Fear of Current or Ex-Partner: Not on file  . Emotionally Abused: Not on file  . Physically Abused: Not on file  . Sexually Abused: Not on file    Family History  Problem Relation Age of Onset  . Heart disease Mother   . Migraines Mother   . Heart attack Father     Past Surgical History:  Procedure Laterality Date  . HERNIA REPAIR    . UTERINE FIBROID SURGERY      ROS: Review of  Systems Negative except as stated above  PHYSICAL EXAM: LMP 03/08/2016  Vitals with BMI 08/12/2020 03/09/2020 10/30/2019  Height 5' 2.5" 5\' 2"  -  Weight 158 lbs 13 oz 176 lbs -  BMI 16.10 96.04 -  Systolic 540 981 -  Diastolic 191 80 -  Pulse 67 70 61   Wt Readings from Last 3 Encounters:  08/12/20 158 lb 12.8 oz (72 kg)  03/09/20 176 lb (79.8 kg)  10/30/19 195 lb (88.5 kg)   Physical Exam General appearance - alert, well appearing, and in no distress Mental status - alert, oriented to person, place, and time, normal mood, behavior, speech, dress, motor activity, and thought processes Eyes - pupils equal and  reactive, extraocular eye movements intact Ears - bilateral TM's and external ear canals normal Nose - normal and patent, no erythema, discharge or polyps Mouth - mucous membranes moist, pharynx normal without lesions Neck - thyroid exam: thyroid enlarged, smooth, nontender, no nodules; neck otherwise supple  Chest - clear to auscultation, no wheezes, rales or rhonchi, symmetric air entry, no tachypnea, retractions or cyanosis Heart - normal rate, regular rhythm, normal S1, S2, no murmurs, rubs, clicks or gallops Neurological - alert, oriented, normal speech, no focal findings or movement disorder noted, cranial nerves II through XII intact, DTR's normal and symmetric, motor and sensory grossly normal bilaterally, normal muscle tone, no tremors, strength 5/5  ASSESSMENT AND PLAN: 1. Seizures ([place]): - Continue Divalproex as prescribed for seizures. Last refilled on 08/06/2020 with additional refills available on file.  - Will obtain valproic acid level during today's visit. - Counseled patient on things that can lower the seizure threshold such as fatigue/lack of sleep and alcohol use which patient should avoid. - Patient was given clear instructions to go to Emergency Department or return to medical center if symptoms don't improve, worsen, or new problems develop.The patient  verbalized understanding. - Follow-up with primary physician in 3 months or sooner if needed.  - Valproic acid level  2. Essential hypertension: - Blood pressure not at goal during today's visit. Patient asymptomatic without chest pressure, chest pain, palpitations, and shortness of breath.  - Patient reports she has not taken blood pressure medication since before April 2021 because she does not enjoy taking pills and does not feel as if she needs them. Reports she will try to begin taking them.  - Lisinopril-Hydrochlorothiazide for high blood pressure as prescribed.  - Follow-up with in 4 weeks with clinical pharmacist for blood pressure check. Write down your blood pressure readings each day and bring those results along with your home blood pressure monitor to your appointment. Medications may be adjusted at that time if needed. - Follow-up with primary physician in 3 months or sooner if needed. - lisinopril-hydrochlorothiazide (ZESTORETIC) 10-12.5 MG tablet; Take 2 tablets by mouth daily. To lower blood pressure  Dispense: 60 tablet; Refill: 2 - Amb Referral to Clinical Pharmacist  Patient was given the opportunity to ask questions.  Patient verbalized understanding of the plan and was able to repeat key elements of the plan. Patient was given clear instructions to go to Emergency Department or return to medical center if symptoms don't improve, worsen, or new problems develop.The patient verbalized understanding.  Camillia Herter, NP

## 2020-08-13 LAB — VALPROIC ACID LEVEL: Valproic Acid Lvl: 104 ug/mL — ABNORMAL HIGH (ref 50–100)

## 2020-08-13 MED FILL — LISINOPRIL-HCTZ 10-12.5 MG: 10-12.5 | 30 days supply | Qty: 60 | Fill #0

## 2020-08-16 NOTE — Addendum Note (Signed)
Addended by: Camillia Herter on: 08/16/2020 07:46 AM   Modules accepted: Orders

## 2020-08-16 NOTE — Progress Notes (Signed)
Please call patient with update.   Valproic acid level elevated.   If patient develops any dizziness, drowsiness, insomnia or double vision which can be indicative of high levels she should return to have levels repeated immediately.    Otherwise return to lab for repeat valproic acid level in 3 to 4 weeks.  Continue Depakote as prescribed.   Follow-up with primary physician as scheduled.

## 2020-08-17 ENCOUNTER — Telehealth: Payer: Self-pay

## 2020-08-17 NOTE — Telephone Encounter (Signed)
Sent pt a MyChart message

## 2020-09-06 MED FILL — DIVALPROEX SOD ER 500 MG TA: 500 | 30 days supply | Qty: 60 | Fill #1

## 2020-09-06 MED FILL — LISINOPRIL-HCTZ 10-12.5 MG: 10-12.5 | 30 days supply | Qty: 60 | Fill #0

## 2020-10-12 ENCOUNTER — Other Ambulatory Visit: Payer: Self-pay | Admitting: Family Medicine

## 2020-10-12 DIAGNOSIS — R569 Unspecified convulsions: Secondary | ICD-10-CM

## 2020-10-12 MED ORDER — DIVALPROEX SODIUM ER 500 MG PO TB24: 1000.0000 mg | ORAL_TABLET | Freq: Every day | ORAL | 1 refills | Status: DC

## 2020-10-12 MED FILL — DIVALPROEX SOD ER 500 MG TA: 500 | 30 days supply | Qty: 60 | Fill #0

## 2020-10-12 NOTE — Telephone Encounter (Signed)
Patient requesting divalproex (DEPAKOTE ER) 500 MG 24 hr tablet, informed please allow 48 to 72 hour turn around time   [place], [place] [employer] Phone:  [phone]  Fax:  [phone]

## 2020-10-12 NOTE — Telephone Encounter (Signed)
Requested medication (s) are due for refill today: no  Requested medication (s) are on the active medication list: yes    Future visit scheduled: yes   Notes to clinic:  script has expired  Review for refill    Requested Prescriptions  Pending Prescriptions Disp Refills   divalproex (DEPAKOTE ER) 500 MG 24 hr tablet 60 tablet 1    Sig: Take 2 tablets (1,000 mg total) by mouth daily.      Not Delegated - Neurology:  Anticonvulsants - Valproates Failed - 10/12/2020  8:48 AM      Failed - This refill cannot be delegated      Failed - Valproic Acid (serum) in normal range and within 360 days    Valproic Acid Lvl  Date Value Ref Range Status  08/12/2020 104 (H) 50 - 100 ug/mL Final    Comment:                                    Detection Limit = 4                            <4 indicates None Detected Toxicity may occur at levels of 100-500. Measurements of free unbound valproic acid may improve the assess- ment of clinical response. Patient drug level exceeds published reference range.  Evaluate clinically for signs of potential toxicity.           Passed - AST in normal range and within 360 days    AST  Date Value Ref Range Status  10/30/2019 17 15 - 41 U/L Final          Passed - ALT in normal range and within 360 days    ALT  Date Value Ref Range Status  10/30/2019 10 0 - 44 U/L Final          Passed - HGB in normal range and within 360 days    Hemoglobin  Date Value Ref Range Status  10/30/2019 12.8 12.0 - 15.0 g/dL Final  01/01/2018 12.4 11.1 - 15.9 g/dL Final   Hemoglobin Other  Date Value Ref Range Status  05/23/2007 0.0 0.0 - 0.0 Final          Passed - PLT in normal range and within 360 days    Platelets  Date Value Ref Range Status  10/30/2019 197 150 - 400 K/uL Final  01/01/2018 281 150 - 379 x10E3/uL Final          Passed - WBC in normal range and within 360 days    WBC  Date Value Ref Range Status  10/30/2019 6.7 4.0 - 10.5 K/uL Final           Passed - HCT in normal range and within 360 days    HCT  Date Value Ref Range Status  10/30/2019 40.5 36 - 46 % Final   Hematocrit  Date Value Ref Range Status  01/01/2018 40.0 34.0 - 46.6 % Final          Passed - Valid encounter within last 12 months    Recent Outpatient Visits           2 months ago Seizures [facility])   [place], [place], NP   7 months ago Seizures [facility])   [place], [place], NP  11 months ago Cellulitis, unspecified cellulitis site   [place], [place], [place]   1 year ago Acute superficial venous thrombosis of lower extremity, left   [place], MD   1 year ago Acute superficial venous thrombosis of lower extremity, left   [place], MD       Future Appointments             In 1 month Antony Blackbird, MD [place]

## 2020-10-15 ENCOUNTER — Other Ambulatory Visit: Payer: Self-pay | Admitting: Internal Medicine

## 2020-10-15 DIAGNOSIS — R569 Unspecified convulsions: Secondary | ICD-10-CM

## 2020-10-15 NOTE — Telephone Encounter (Signed)
Medication Refill - Medication: Pt called to report that she needs this rerouted to pharmacy listed below. She is completely out  divalproex (DEPAKOTE ER) 500 MG 24 hr tablet   Has the patient contacted their pharmacy? Yes.   (Agent: If no, request that the patient contact the pharmacy for the refill.) (Agent: If yes, when and what did the pharmacy advise?)  Preferred Pharmacy (with phone number or street name):  [facility] [address] [place], [place] - [address]  [address] [place] [address]  Phone: [phone] Fax: [phone]     Agent: Please be advised that RX refills may take up to 3 business days. We ask that you follow-up with your pharmacy.

## 2020-10-15 NOTE — Telephone Encounter (Signed)
Requested medication (s) are due for refill today: Yes  Requested medication (s) are on the active medication list: Yes  Last refill: 10/12/20-sent to wrong pharmacy  Future visit scheduled: Yes  Notes to clinic:  Unable to refill per protocol cannot delegate. Resend to another pharmacy     Requested Prescriptions  Pending Prescriptions Disp Refills   divalproex (DEPAKOTE ER) 500 MG 24 hr tablet 60 tablet 1    Sig: Take 2 tablets (1,000 mg total) by mouth daily.      Not Delegated - Neurology:  Anticonvulsants - Valproates Failed - 10/15/2020  5:28 PM      Failed - This refill cannot be delegated      Failed - Valproic Acid (serum) in normal range and within 360 days    Valproic Acid Lvl  Date Value Ref Range Status  08/12/2020 104 (H) 50 - 100 ug/mL Final    Comment:                                    Detection Limit = 4                            <4 indicates None Detected Toxicity may occur at levels of 100-500. Measurements of free unbound valproic acid may improve the assess- ment of clinical response. Patient drug level exceeds published reference range.  Evaluate clinically for signs of potential toxicity.           Passed - AST in normal range and within 360 days    AST  Date Value Ref Range Status  10/30/2019 17 15 - 41 U/L Final          Passed - ALT in normal range and within 360 days    ALT  Date Value Ref Range Status  10/30/2019 10 0 - 44 U/L Final          Passed - HGB in normal range and within 360 days    Hemoglobin  Date Value Ref Range Status  10/30/2019 12.8 12.0 - 15.0 g/dL Final  01/01/2018 12.4 11.1 - 15.9 g/dL Final   Hemoglobin Other  Date Value Ref Range Status  05/23/2007 0.0 0.0 - 0.0 Final          Passed - PLT in normal range and within 360 days    Platelets  Date Value Ref Range Status  10/30/2019 197 150 - 400 K/uL Final  01/01/2018 281 150 - 379 x10E3/uL Final          Passed - WBC in normal range and within 360  days    WBC  Date Value Ref Range Status  10/30/2019 6.7 4.0 - 10.5 K/uL Final          Passed - HCT in normal range and within 360 days    HCT  Date Value Ref Range Status  10/30/2019 40.5 36 - 46 % Final   Hematocrit  Date Value Ref Range Status  01/01/2018 40.0 34.0 - 46.6 % Final          Passed - Valid encounter within last 12 months    Recent Outpatient Visits           2 months ago Seizures [facility])   [place], [place], NP   7 months ago Seizures [facility])   [place]  [place], [place], NP   11 months ago Cellulitis, unspecified cellulitis site   [place], [place], [place]   1 year ago Acute superficial venous thrombosis of lower extremity, left   [place], MD   1 year ago Acute superficial venous thrombosis of lower extremity, left   [place], MD       Future Appointments             In 3 weeks Camillia Herter, NP [place]

## 2020-11-04 ENCOUNTER — Telehealth: Payer: Self-pay

## 2020-11-04 NOTE — Telephone Encounter (Signed)
Called patient and LVM letting patient know that her appointment for 12/16 with Amy Minette Brine has been cancelled due to Amy no longer being at our office location. Advised patient to call back and schedule with another provider or to schedule with Amy at [facility]. [phone].

## 2020-11-11 ENCOUNTER — Ambulatory Visit: Payer: Self-pay | Admitting: Family

## 2020-11-11 ENCOUNTER — Ambulatory Visit: Payer: Self-pay | Admitting: Family Medicine

## 2020-11-16 MED FILL — DIVALPROEX SOD ER 500 MG TA: 500 | 30 days supply | Qty: 60 | Fill #1

## 2020-11-18 ENCOUNTER — Ambulatory Visit: Payer: Self-pay | Admitting: Internal Medicine

## 2020-11-23 MED FILL — ?DIVALPROEX SOD ER 500MG TA: 500 | 30 days supply | Qty: 60 | Fill #1

## 2020-12-01 ENCOUNTER — Ambulatory Visit: Payer: Self-pay | Admitting: Physician Assistant

## 2020-12-05 ENCOUNTER — Other Ambulatory Visit: Payer: Self-pay

## 2020-12-05 ENCOUNTER — Encounter (HOSPITAL_COMMUNITY): Payer: Self-pay

## 2020-12-05 ENCOUNTER — Emergency Department (HOSPITAL_COMMUNITY): Payer: Self-pay

## 2020-12-05 ENCOUNTER — Inpatient Hospital Stay (HOSPITAL_COMMUNITY)
Admission: EM | Admit: 2020-12-05 | Discharge: 2020-12-08 | DRG: 100 | Disposition: A | Discharge status: Home / Self Care | Payer: Self-pay | Attending: Internal Medicine | Admitting: Internal Medicine

## 2020-12-05 DIAGNOSIS — I10 Essential (primary) hypertension: Secondary | ICD-10-CM | POA: Diagnosis present

## 2020-12-05 DIAGNOSIS — R569 Unspecified convulsions: Secondary | ICD-10-CM

## 2020-12-05 DIAGNOSIS — G40901 Epilepsy, unspecified, not intractable, with status epilepticus: Secondary | ICD-10-CM

## 2020-12-05 DIAGNOSIS — R40243 Glasgow coma scale score 3-8, unspecified time: Secondary | ICD-10-CM

## 2020-12-05 DIAGNOSIS — I160 Hypertensive urgency: Secondary | ICD-10-CM | POA: Diagnosis present

## 2020-12-05 DIAGNOSIS — Z86718 Personal history of other venous thrombosis and embolism: Secondary | ICD-10-CM

## 2020-12-05 DIAGNOSIS — E876 Hypokalemia: Secondary | ICD-10-CM | POA: Diagnosis not present

## 2020-12-05 DIAGNOSIS — Z885 Allergy status to narcotic agent status: Secondary | ICD-10-CM

## 2020-12-05 DIAGNOSIS — E162 Hypoglycemia, unspecified: Secondary | ICD-10-CM | POA: Diagnosis present

## 2020-12-05 DIAGNOSIS — Z79899 Other long term (current) drug therapy: Secondary | ICD-10-CM

## 2020-12-05 DIAGNOSIS — G40201 Localization-related (focal) (partial) symptomatic epilepsy and epileptic syndromes with complex partial seizures, not intractable, with status epilepticus: Principal | ICD-10-CM | POA: Diagnosis present

## 2020-12-05 DIAGNOSIS — Z7901 Long term (current) use of anticoagulants: Secondary | ICD-10-CM

## 2020-12-05 DIAGNOSIS — G9341 Metabolic encephalopathy: Secondary | ICD-10-CM | POA: Diagnosis present

## 2020-12-05 DIAGNOSIS — J1282 Pneumonia due to coronavirus disease 2019: Secondary | ICD-10-CM | POA: Diagnosis present

## 2020-12-05 DIAGNOSIS — Z886 Allergy status to analgesic agent status: Secondary | ICD-10-CM

## 2020-12-05 DIAGNOSIS — J45909 Unspecified asthma, uncomplicated: Secondary | ICD-10-CM | POA: Diagnosis present

## 2020-12-05 DIAGNOSIS — U071 COVID-19: Secondary | ICD-10-CM | POA: Diagnosis present

## 2020-12-05 LAB — CBC WITH DIFFERENTIAL/PLATELET
Abs Immature Granulocytes: 0.12 10*3/uL — ABNORMAL HIGH (ref 0.00–0.07)
Basophils Absolute: 0 10*3/uL (ref 0.0–0.1)
Basophils Relative: 0 %
Eosinophils Absolute: 0 10*3/uL (ref 0.0–0.5)
Eosinophils Relative: 0 %
HCT: 42.6 % (ref 36.0–46.0)
Hemoglobin: 13.8 g/dL (ref 12.0–15.0)
Immature Granulocytes: 1 %
Lymphocytes Relative: 4 %
Lymphs Abs: 0.3 10*3/uL — ABNORMAL LOW (ref 0.7–4.0)
MCH: 30 pg (ref 26.0–34.0)
MCHC: 32.4 g/dL (ref 30.0–36.0)
MCV: 92.6 fL (ref 80.0–100.0)
Monocytes Absolute: 0.6 10*3/uL (ref 0.1–1.0)
Monocytes Relative: 7 %
Neutro Abs: 7.4 10*3/uL (ref 1.7–7.7)
Neutrophils Relative %: 88 %
Platelets: 184 10*3/uL (ref 150–400)
RBC: 4.6 MIL/uL (ref 3.87–5.11)
RDW: 13 % (ref 11.5–15.5)
WBC: 8.4 10*3/uL (ref 4.0–10.5)
nRBC: 0 % (ref 0.0–0.2)

## 2020-12-05 LAB — COMPREHENSIVE METABOLIC PANEL
ALT: 11 U/L (ref 0–44)
AST: 33 U/L (ref 15–41)
Albumin: 3.9 g/dL (ref 3.5–5.0)
Alkaline Phosphatase: 39 U/L (ref 38–126)
Anion gap: 14 (ref 5–15)
BUN: 13 mg/dL (ref 6–20)
CO2: 24 mmol/L (ref 22–32)
Calcium: 9.5 mg/dL (ref 8.9–10.3)
Chloride: 99 mmol/L (ref 98–111)
Creatinine, Ser: 1.01 mg/dL — ABNORMAL HIGH (ref 0.44–1.00)
GFR, Estimated: >60 mL/min (ref >60)
Glucose, Bld: 93 mg/dL (ref 70–99)
Potassium: 3.5 mmol/L (ref 3.5–5.1)
Sodium: 137 mmol/L (ref 135–145)
Total Bilirubin: 0.4 mg/dL (ref 0.3–1.2)
Total Protein: 7.4 g/dL (ref 6.5–8.1)

## 2020-12-05 LAB — I-STAT BETA HCG BLOOD, ED (MC, WL, AP ONLY): I-stat hCG, quantitative: <5 m[IU]/mL (ref <5)

## 2020-12-05 LAB — URINALYSIS, ROUTINE W REFLEX MICROSCOPIC
Bilirubin Urine: NEGATIVE
Glucose, UA: NEGATIVE mg/dL
Hgb urine dipstick: NEGATIVE
Ketones, ur: 5 mg/dL — AB
Nitrite: NEGATIVE
Protein, ur: NEGATIVE mg/dL
Specific Gravity, Urine: 1.008 (ref 1.005–1.030)
pH: 6 (ref 5.0–8.0)

## 2020-12-05 LAB — I-STAT VENOUS BLOOD GAS, ED
Acid-Base Excess: 4 mmol/L — ABNORMAL HIGH (ref 0.0–2.0)
Bicarbonate: 29.5 mmol/L — ABNORMAL HIGH (ref 20.0–28.0)
Calcium, Ion: 1.16 mmol/L (ref 1.15–1.40)
HCT: 45 % (ref 36.0–46.0)
Hemoglobin: 15.3 g/dL — ABNORMAL HIGH (ref 12.0–15.0)
O2 Saturation: 73 %
Potassium: 3.4 mmol/L — ABNORMAL LOW (ref 3.5–5.1)
Sodium: 139 mmol/L (ref 135–145)
TCO2: 31 mmol/L (ref 22–32)
pCO2, Ven: 45.2 mmHg (ref 44.0–60.0)
pH, Ven: 7.422 (ref 7.250–7.430)
pO2, Ven: 38 mmHg (ref 32.0–45.0)

## 2020-12-05 LAB — CBG MONITORING, ED: Glucose-Capillary: 100 mg/dL — ABNORMAL HIGH (ref 70–99)

## 2020-12-05 LAB — VALPROIC ACID LEVEL: Valproic Acid Lvl: 68 ug/mL (ref 50.0–100.0)

## 2020-12-05 LAB — CK: Total CK: 534 U/L — ABNORMAL HIGH (ref 38–234)

## 2020-12-05 LAB — SARS CORONAVIRUS 2 (TAT 6-24 HRS): SARS Coronavirus 2: POSITIVE — AB

## 2020-12-05 LAB — TSH: TSH: 0.541 u[IU]/mL (ref 0.350–4.500)

## 2020-12-05 LAB — AMMONIA: Ammonia: 21 umol/L (ref 9–35)

## 2020-12-05 MED ORDER — VALPROATE SODIUM 100 MG/ML IV SOLN
500.0000 mg | Freq: Two times a day (BID) | INTRAVENOUS | Status: DC
  Administered 2020-12-05: 500 mg via INTRAVENOUS
  Filled 2020-12-05 (×3): qty 5

## 2020-12-05 MED ORDER — ACETAMINOPHEN 650 MG RE SUPP: 650.0000 mg | Freq: Four times a day (QID) | RECTAL | Status: DC | PRN

## 2020-12-05 MED ORDER — SODIUM CHLORIDE 0.9 % IV SOLN
200.0000 mg | Freq: Once | INTRAVENOUS | Status: AC
  Administered 2020-12-06: 200 mg via INTRAVENOUS
  Filled 2020-12-05: qty 40

## 2020-12-05 MED ORDER — SODIUM CHLORIDE 0.9 % IV SOLN: INTRAVENOUS | Status: DC

## 2020-12-05 MED ORDER — ACETAMINOPHEN 325 MG PO TABS: 650.0000 mg | ORAL_TABLET | Freq: Four times a day (QID) | ORAL | Status: DC | PRN

## 2020-12-05 MED ORDER — DIVALPROEX SODIUM 250 MG PO DR TAB: 500.0000 mg | DELAYED_RELEASE_TABLET | Freq: Two times a day (BID) | ORAL | Status: DC

## 2020-12-05 MED ORDER — HEPARIN SODIUM (PORCINE) 5000 UNIT/ML IJ SOLN
5000.0000 [IU] | Freq: Three times a day (TID) | INTRAMUSCULAR | Status: DC
  Administered 2020-12-06 – 2020-12-07 (×6): 5000 [IU] via SUBCUTANEOUS
  Filled 2020-12-05 (×6): qty 1

## 2020-12-05 MED ORDER — LEVETIRACETAM IN NACL 1500 MG/100ML IV SOLN
1500.0000 mg | Freq: Once | INTRAVENOUS | Status: AC
  Administered 2020-12-05: 1500 mg via INTRAVENOUS
  Filled 2020-12-05: qty 100

## 2020-12-05 MED ORDER — SODIUM CHLORIDE 0.9 % IV SOLN: 100.0000 mg | Freq: Every day | INTRAVENOUS | Status: DC

## 2020-12-05 NOTE — ED Provider Notes (Signed)
55 year old female with a chief complaint of altered mental status.  I received the patient in signout from Dr. Kathrynn Humble please see his note for full history and physical.  Briefly the patient found to be altered this morning by her family.  Persistent altered mental status here.  CT of the head negative for acute abnormality.  And plan for laboratory evaluation and for possible status epilepticus.  Neurology to see and perform a stat EEG.  With continued encephalopathy, neuro recommending continuous eeg, will discuss with medicine for admission.   CRITICAL CARE Performed by: Cecilio Asper   Total critical care time: 35 minutes  Critical care time was exclusive of separately billable procedures and treating other patients.  Critical care was necessary to treat or prevent imminent or life-threatening deterioration.  Critical care was time spent personally by me on the following activities: development of treatment plan with patient and/or surrogate as well as nursing, discussions with consultants, evaluation of patient's response to treatment, examination of patient, obtaining history from patient or surrogate, ordering and performing treatments and interventions, ordering and review of laboratory studies, ordering and review of radiographic studies, pulse oximetry and re-evaluation of patient's condition.     Deno Etienne, DO 12/05/20 [id number]

## 2020-12-05 NOTE — ED Provider Notes (Signed)
[facility] EMERGENCY DEPARTMENT Provider Note   CSN: [id number] Arrival date & time: 12/05/20  1228     History Chief Complaint  Patient presents with  . Altered Mental Status    [name] C Kevorkian is a 55 y.o. female.  HPI    Level 5 caveat for altered mental status.  55 year old female with history of seizures on Depakote comes in a chief complaint of altered mental status.  Patient also has history of superficial venous thrombosis and possibly on Xarelto.  I spoke with patient's daughter over the phone.  She reports that patient was last seen normal at 11:00 when she dropped her off at her home.  This morning family found her wedged between her bed and the wall.  Patient was unresponsive and EMS was called.  Family reports that patient's seizure is generally speaking well controlled and she is compliant with her medications.  Last night was normal, they did not suspect any illness.  Past Medical History:  Diagnosis Date  . Asthma   . Headache(784.0)   . Hypertension   . Seizures [facility])     Patient Active Problem List   Diagnosis Date Noted  . Acute superficial venous thrombosis of lower extremity, left 12/13/2018  . Essential hypertension 12/13/2018  . Chronic migraine 09/07/2016  . Seizures ([place]) 08/21/2016  . Seizure ([place]) 08/20/2016  . Asthma 08/20/2016  . Nausea and vomiting 08/20/2016  . Elevated blood pressure 08/20/2016  . DENTAL PAIN 02/12/2009  . DVT, HX OF 01/20/2008  . FIBROIDS, UTERUS 01/16/2008  . COUGH 01/16/2008  . EMBOLISM/THROMBOSIS, DEEP VSL LWR EXTRM NOS 06/03/2007    Past Surgical History:  Procedure Laterality Date  . HERNIA REPAIR    . UTERINE FIBROID SURGERY       OB History    Gravida  3   Para      Term      Preterm      AB  1   Living  2     SAB  1   IAB  0   Ectopic      Multiple      Live Births              Family History  Problem Relation Age of Onset  . Heart disease Mother   .  Migraines Mother   . Heart attack Father     Social History   Tobacco Use  . Smoking status: Never Smoker  . Smokeless tobacco: Never Used  Substance Use Topics  . Alcohol use: No  . Drug use: Yes    Types: Marijuana    Home Medications Prior to Admission medications   Medication Sig Start Date End Date Taking? Authorizing Provider  acetaminophen (TYLENOL) 500 MG tablet Take 2 tablets (1,000 mg total) by mouth every 8 (eight) hours as needed. 06/20/17   Alfonse Spruce, FNP  albuterol (PROVENTIL HFA;VENTOLIN HFA) 108 (90 Base) MCG/ACT inhaler Inhale 2 puffs into the lungs every 6 (six) hours as needed for wheezing or shortness of breath. Patient not taking: Reported on 11/05/2019 06/20/17   Alfonse Spruce, FNP  divalproex (DEPAKOTE ER) 500 MG 24 hr tablet Take 2 tablets (1,000 mg total) by mouth daily. 10/12/20 12/11/20  Fulp, Cammie, MD  lisinopril-hydrochlorothiazide (ZESTORETIC) 10-12.5 MG tablet Take 2 tablets by mouth daily. To lower blood pressure 08/12/20   Camillia Herter, NP  mupirocin ointment (BACTROBAN) 2 % Apply to AA bid prn Patient not taking: Reported on  03/09/2020 11/05/19   Argentina Donovan, PA-C  rivaroxaban (XARELTO) 20 MG TABS tablet Take 1 tablet (20 mg total) by mouth daily with supper. Start after completing the started pack Patient not taking: Reported on 11/05/2019 01/24/19   Fulp, Ander Gaster, MD  SUMAtriptan (IMITREX) 50 MG tablet Take 1 tablet (50 mg total) by mouth every 2 (two) hours as needed for migraine. May repeat in 2 hours if headache persists or recurs. Patient not taking: Reported on 11/05/2019 12/12/18   Argentina Donovan, PA-C    Allergies    Dilantin [phenytoin], Aspirin, and Tramadol  Review of Systems   Review of Systems  Unable to perform ROS: Patient unresponsive    Physical Exam Updated Vital Signs BP (!) 164/100   Pulse 63   Temp 98.3 F (36.8 C) (Temporal)   Resp 14   Ht 5' (1.524 m)   Wt 72 kg   LMP 03/08/2016   SpO2 98%    BMI 31.00 kg/m   Physical Exam Vitals and nursing note reviewed.  Constitutional:      Appearance: She is well-developed.     Comments: Unresponsive  HENT:     Head: Atraumatic.  Eyes:     Extraocular Movements: EOM normal.  Cardiovascular:     Rate and Rhythm: Normal rate.  Pulmonary:     Effort: Pulmonary effort is normal.  Abdominal:     General: Bowel sounds are normal.  Musculoskeletal:     Cervical back: Normal range of motion and neck supple.  Skin:    General: Skin is warm.     Findings: Erythema present.  Neurological:     Comments: GCS 7 (1-1-5). Positive gag reflex.     ED Results / Procedures / Treatments   Labs (all labs ordered are listed, but only abnormal results are displayed) Labs Reviewed  CBG MONITORING, ED - Abnormal; Notable for the following components:      Result Value   Glucose-Capillary 100 (*)    All other components within normal limits  SARS CORONAVIRUS 2 (TAT 6-24 HRS)  VALPROIC ACID LEVEL  CBC WITH DIFFERENTIAL/PLATELET  COMPREHENSIVE METABOLIC PANEL  URINALYSIS, ROUTINE W REFLEX MICROSCOPIC  CK  AMMONIA  I-STAT BETA HCG BLOOD, ED (MC, [facility], AP ONLY)  CBG MONITORING, ED  I-STAT VENOUS BLOOD GAS, ED    EKG None  Radiology CT Head Wo Contrast  Result Date: 12/05/2020 CLINICAL DATA:  Mental status change EXAM: CT HEAD WITHOUT CONTRAST TECHNIQUE: Contiguous axial images were obtained from the base of the skull through the vertex without intravenous contrast. COMPARISON:  11/24/2018 FINDINGS: Brain: No evidence of acute infarction, hemorrhage, hydrocephalus, extra-axial collection or mass lesion/mass effect. Vascular: No hyperdense vessel or unexpected calcification. Skull: Normal. Negative for fracture or focal lesion. Sinuses/Orbits: No acute finding. Other: None. IMPRESSION: Normal head CT without contrast for age Electronically Signed   By: Jerilynn Mages.  Shick M.D.   On: 12/05/2020 14:01    Procedures .Critical Care Performed by:  Varney Biles, MD Authorized by: Varney Biles, MD   Critical care provider statement:    Critical care time (minutes):  62   Critical care was necessary to treat or prevent imminent or life-threatening deterioration of the following conditions:  CNS failure or compromise   Critical care was time spent personally by me on the following activities:  Discussions with consultants, evaluation of patient's response to treatment, examination of patient, ordering and performing treatments and interventions, ordering and review of laboratory studies, ordering and review of  radiographic studies, pulse oximetry, re-evaluation of patient's condition, obtaining history from patient or surrogate and review of old charts   (including critical care time)  Medications Ordered in ED Medications  levETIRAcetam (KEPPRA) IVPB 1500 mg/ 100 mL premix (has no administration in time range)    ED Course  I have reviewed the triage vital signs and the nursing notes.  Pertinent labs & imaging results that were available during my care of the patient were reviewed by me and considered in my medical decision making (see chart for details).  Clinical Course as of 12/05/20 1508  Nancy Fetter Dec 05, 2020  1507 Patient has been reassessed on 2 separate occasions.  On the second occasion, the sister was at the bedside.  Results updated to her.  Labs are pending at this time.  Patient's mental status has not significantly improved, therefore I will consult neurology at this time to see if patient needs evaluation for status epilepticus.  Keppra load has been ordered. [AN]    Clinical Course User Index [AN] Varney Biles, MD   MDM Rules/Calculators/A&P                          55 year old female comes in a chief complaint of unresponsiveness.  She has history of seizures. Looking at her labs, it appears that she had high Depakote levels in the recent past.  Differential diagnosis includes seizures, status epilepticus,  Depakote toxicity. From the trauma, brain bleed also possible.  Elevated CK/rhabdo possible as well along with electrolyte abnormalities.  Appropriate labs ordered.  Final Clinical Impression(s) / ED Diagnoses Final diagnoses:  Nonintractable epilepsy with status epilepticus, unspecified epilepsy type ([place])  Glasgow coma scale total score 3-8, unspecified coma timing [facility])    Rx / DC Orders ED Discharge Orders    None       Varney Biles, MD 12/05/20 [phone]

## 2020-12-05 NOTE — ED Notes (Signed)
Eula Flax (AUNT) [phone]

## 2020-12-05 NOTE — ED Notes (Signed)
Neurology at bedside.

## 2020-12-05 NOTE — H&P (Signed)
History and Physical    Tammy [name] [id number] DOB: 1966-08-16 DOA: 12/05/2020  PCP: Patient, No Pcp Per  Patient coming from: Home.  History obtained from ER physician.  Chief Complaint: Altered mental status.  HPI: Tammy [name] is a 55 y.o. female with history of seizures and hypertension was brought to the ER by family as patient was found to be confused.  Per family patient also has been having subjective feeling of fever chills last couple of days with muscle aches.  This morning patient was found to be confused and was brought to the ER.  ED Course: In the ER patient is found to have a tongue bite on exam by the neurologist CT it was unremarkable.  Patient remained confused lethargic but arousable.  Temperature was 99.4 otherwise no leukocytosis CK was 534 ammonia level is 21 and valproic acid was therapeutic.  On-call neurologist was consulted and patient was given Keppra loading dose and started on Depakote IV.  Patient admitted for encephalopathy and continuous EEG monitoring.  Patient was found to be Covid test positive but not hypoxic.  Chest x-ray is pending.  Review of Systems: As per HPI, rest all negative.   Past Medical History:  Diagnosis Date  . Asthma   . Headache(784.0)   . Hypertension   . Seizures ([place])     Past Surgical History:  Procedure Laterality Date  . HERNIA REPAIR    . UTERINE FIBROID SURGERY       reports that she has never smoked. She has never used smokeless tobacco. She reports current drug use. Drug: Marijuana. She reports that she does not drink alcohol.  Allergies  Allergen Reactions  . Dilantin [Phenytoin] Swelling  . Aspirin Other (See Comments)    Patient unsure of reaction   . Tramadol Itching and Other (See Comments)    Pt states she ins unsure of reaction Pt states she ins unsure of reaction Pt states she ins unsure of reaction Pt states she ins unsure of reaction    Family History  Problem Relation Age of Onset   . Heart disease Mother   . Migraines Mother   . Heart attack Father     Prior to Admission medications   Medication Sig Start Date End Date Taking? Authorizing Provider  divalproex (DEPAKOTE ER) 500 MG 24 hr tablet Take 2 tablets (1,000 mg total) by mouth daily. Patient taking differently: Take 500 mg by mouth in the morning and at bedtime. 10/12/20 12/11/20 Yes Fulp, Cammie, MD  albuterol (PROVENTIL HFA;VENTOLIN HFA) 108 (90 Base) MCG/ACT inhaler Inhale 2 puffs into the lungs every 6 (six) hours as needed for wheezing or shortness of breath. Patient not taking: No sig reported 06/20/17   Alfonse Spruce, FNP  lisinopril-hydrochlorothiazide (ZESTORETIC) 10-12.5 MG tablet Take 2 tablets by mouth daily. To lower blood pressure Patient not taking: Reported on 12/05/2020 08/12/20   Camillia Herter, NP  mupirocin ointment (BACTROBAN) 2 % Apply to AA bid prn Patient not taking: No sig reported 11/05/19   Argentina Donovan, PA-C  rivaroxaban (XARELTO) 20 MG TABS tablet Take 1 tablet (20 mg total) by mouth daily with supper. Start after completing the started pack Patient not taking: No sig reported 01/24/19   Fulp, Cammie, MD  SUMAtriptan (IMITREX) 50 MG tablet Take 1 tablet (50 mg total) by mouth every 2 (two) hours as needed for migraine. May repeat in 2 hours if headache persists or recurs. Patient not taking: No sig reported  12/12/18   Argentina Donovan, PA-C    Physical Exam: Constitutional: Moderately built and nourished. Vitals:   12/05/20 1800 12/05/20 1815 12/05/20 1900 12/05/20 2115  BP: (!) 154/98 (!) 143/92 138/75   Pulse: 68 (!) 26 (!) 53 (!) 55  Resp: 17 20 16 17   Temp:      TempSrc:      SpO2: 100% 100% 98% 98%  Weight:      Height:       Eyes: Anicteric no pallor. ENMT: No discharge from the ears eyes nose or mouth. Neck: No mass felt.  No neck rigidity. Respiratory: No rhonchi or crepitations. Cardiovascular: S1-S2 heard. Abdomen: Soft nontender bowel sounds  present. Musculoskeletal: No edema. Skin: No rash. Neurologic: Patient is lethargic pupils are reactive But does not follow commands. Psychiatric: Lethargic.   Labs on Admission: I have personally reviewed following labs and imaging studies  CBC: Recent Labs  Lab 12/05/20 1441 12/05/20 1508  WBC 8.4  --   NEUTROABS 7.4  --   HGB 13.8 15.3*  HCT 42.6 45.0  MCV 92.6  --   PLT 184  --    Basic Metabolic Panel: Recent Labs  Lab 12/05/20 1441 12/05/20 1508  NA 137 139  K 3.5 3.4*  CL 99  --   CO2 24  --   GLUCOSE 93  --   BUN 13  --   CREATININE 1.01*  --   CALCIUM 9.5  --    GFR: Estimated Creatinine Clearance: 57.7 mL/min (A) (by C-G formula based on SCr of 1.01 mg/dL (H)). Liver Function Tests: Recent Labs  Lab 12/05/20 1441  AST 33  ALT 11  ALKPHOS 39  BILITOT 0.4  PROT 7.4  ALBUMIN 3.9   No results for input(s): LIPASE, AMYLASE in the last 168 hours. Recent Labs  Lab 12/05/20 1441  AMMONIA 21   Coagulation Profile: No results for input(s): INR, PROTIME in the last 168 hours. Cardiac Enzymes: Recent Labs  Lab 12/05/20 1441  CKTOTAL 534*   BNP (last 3 results) No results for input(s): PROBNP in the last 8760 hours. HbA1C: No results for input(s): HGBA1C in the last 72 hours. CBG: Recent Labs  Lab 12/05/20 1237  GLUCAP 100*   Lipid Profile: No results for input(s): CHOL, HDL, LDLCALC, TRIG, CHOLHDL, LDLDIRECT in the last 72 hours. Thyroid Function Tests: Recent Labs    12/05/20 2006  TSH 0.541   Anemia Panel: No results for input(s): VITAMINB12, FOLATE, FERRITIN, TIBC, IRON, RETICCTPCT in the last 72 hours. Urine analysis:    Component Value Date/Time   COLORURINE YELLOW 12/05/2020 2154   APPEARANCEUR CLEAR 12/05/2020 2154   LABSPEC 1.008 12/05/2020 2154   PHURINE 6.0 12/05/2020 2154   GLUCOSEU NEGATIVE 12/05/2020 2154   HGBUR NEGATIVE 12/05/2020 2154   BILIRUBINUR NEGATIVE 12/05/2020 2154   KETONESUR 5 (A) 12/05/2020 2154    PROTEINUR NEGATIVE 12/05/2020 2154   UROBILINOGEN 1.0 02/05/2014 1852   NITRITE NEGATIVE 12/05/2020 2154   LEUKOCYTESUR SMALL (A) 12/05/2020 2154   Sepsis Labs: @LABRCNTIP (procalcitonin:4,lacticidven:4) ) Recent Results (from the past 240 hour(s))  SARS CORONAVIRUS 2 (TAT 6-24 HRS) Nasopharyngeal Nasopharyngeal Swab     Status: Abnormal   Collection Time: 12/05/20  2:41 PM   Specimen: Nasopharyngeal Swab  Result Value Ref Range Status   SARS Coronavirus 2 POSITIVE (A) NEGATIVE Final    Comment: (NOTE) SARS-CoV-2 target nucleic acids are DETECTED.  The SARS-CoV-2 RNA is generally detectable in upper and lower respiratory specimens during  the acute phase of infection. Positive results are indicative of the presence of SARS-CoV-2 RNA. Clinical correlation with patient history and other diagnostic information is  necessary to determine patient infection status. Positive results do not rule out bacterial infection or co-infection with other viruses.  The expected result is Negative.  Fact Sheet for Patients: SugarRoll.be  Fact Sheet for Healthcare Providers: https://www.woods-mathews.com/  This test is not yet approved or cleared by the [place] FDA and  has been authorized for detection and/or diagnosis of SARS-CoV-2 by FDA under an Emergency Use Authorization (EUA). This EUA will remain  in effect (meaning this test can be used) for the duration of the COVID-19 declaration under Section 564(b)(1) of the Act, 21 U. S.C. section 360bbb-3(b)(1), unless the authorization is terminated or revoked sooner.   Performed at [facility], [place] [address], [place], [place] [address]      Radiological Exams on Admission: CT Head Wo Contrast  Result Date: 12/05/2020 CLINICAL DATA:  Mental status change EXAM: CT HEAD WITHOUT CONTRAST TECHNIQUE: Contiguous axial images were obtained from the base of the skull through the vertex without  intravenous contrast. COMPARISON:  11/24/2018 FINDINGS: Brain: No evidence of acute infarction, hemorrhage, hydrocephalus, extra-axial collection or mass lesion/mass effect. Vascular: No hyperdense vessel or unexpected calcification. Skull: Normal. Negative for fracture or focal lesion. Sinuses/Orbits: No acute finding. Other: None. IMPRESSION: Normal head CT without contrast for age Electronically Signed   By: Jerilynn Mages.  Shick M.D.   On: 12/05/2020 14:01     Assessment/Plan Principal Problem:   Seizure ([place]) Active Problems:   COVID-19 virus infection    1. Acute encephalopathy likely postictal -appreciate neurology consult.  Plan is to get continuous EEG monitoring.  Patient wakes up on calling her name.  Otherwise does not follow commands.  On Depakote IV at this time.  Did receive Keppra loading dose.  Will follow further recommendations per neurology. 2. COVID-19 infection presently not hypoxic.  Per family patient was having some myalgias and body aches for last few days.  Will start patient on remdesivir.  Will get chest x-ray closely monitor respiratory status following.  Markers. 3. Hypertension -patient has elevated blood pressure.  Per records patient has not taken her antihypertensives and wanted to manage nonmedically.  We will keep patient on as needed IV hydralazine for now while patient n.p.o.  Follow blood pressure trends.   DVT prophylaxis: Lovenox. Code Status: Full code. Family Communication: We will need to discuss with family. Disposition Plan: To be determined. Consults called: Neurology. Admission status: Inpatient.   Rise Patience MD [facility] Pager [phone].  If 7PM-7AM, please contact night-coverage [email or web address] Password [facility]  12/05/2020, 11:26 PM

## 2020-12-05 NOTE — ED Triage Notes (Signed)
Pt BIB [facility] from home, family called 43 for cardiac arrest, upon EMS arrival pt was wedged between the bed and wall. Pt seen at a hospital yesterday, not pulling up in our records. Pt has a hx of seizure, takes Depakote. Pt is clamping down on her teeth, unable to assess for oral trauma. No obvious signs of bowel or bladder incontinence. However pt found with an partially open water bottle behind her, wet from posterior to feet but no signs of urine incontinence. GCS 7 no corneal reflexes, no verbal response, only tactile response.  LKW 2345 last night. Family has been calling her all morning with no answer.   BP 140/62 HR 67 RR 17 98% RA  CBG 116 98.6   22G RH

## 2020-12-05 NOTE — Consult Note (Signed)
NEUROLOGY CONSULTATION NOTE   Date of service: December 05, 2020 Patient Name: Tammy [name] MRN:  [id number] DOB:  21-Jul-1966 Reason for consult: "AMS" _ _ _   _ __   _ __ _ _  __ __   _ __   __ _  History of Present Illness  [name] [name] is a 55 y.o. female with PMH significant for complex partial seizure with secondary generalization who is on Depakote extended release tablet 500mg  BID who is admitted with acute onset AMS.  Patient's family at the bedside reports the patient was dropped off at her home at approximately 11 AM this morning.  She was found wedged between her bed and the wall unresponsive.  They called paramedics and patient was brought into the emergency department.  She has persistent altered mental status and fever consulted for further work-up.  Work-up in the emergency department with hypertension but otherwise no significant abnormalities on the vitals.  Labs with no significant abnormality on the CBC and chemistry.  Family at bedside reports that patient has been lethargic for the last few days. Has been reporting muscle aches and pain, has been covering her self in blankets reporting that she is feeling cold. She does     ROS   Unable to provide any ROS dueto encephalopathy.  Past History   Past Medical History:  Diagnosis Date  . Asthma   . Headache(784.0)   . Hypertension   . Seizures ([place])    Past Surgical History:  Procedure Laterality Date  . HERNIA REPAIR    . UTERINE FIBROID SURGERY     Family History  Problem Relation Age of Onset  . Heart disease Mother   . Migraines Mother   . Heart attack Father    Social History   Socioeconomic History  . Marital status: Single    Spouse name: Not on file  . Number of children: Not on file  . Years of education: Not on file  . Highest education level: Not on file  Occupational History  . Not on file  Tobacco Use  . Smoking status: Never Smoker  . Smokeless tobacco: Never Used  Substance  and Sexual Activity  . Alcohol use: No  . Drug use: Yes    Types: Marijuana  . Sexual activity: Not on file  Other Topics Concern  . Not on file  Social History Narrative  . Not on file   Social Determinants of Health   Financial Resource Strain: Not on file  Food Insecurity: Not on file  Transportation Needs: Not on file  Physical Activity: Not on file  Stress: Not on file  Social Connections: Not on file   Allergies  Allergen Reactions  . Dilantin [Phenytoin] Swelling  . Aspirin Other (See Comments)    Patient unsure of reaction   . Tramadol Itching and Other (See Comments)    Pt states she ins unsure of reaction Pt states she ins unsure of reaction Pt states she ins unsure of reaction Pt states she ins unsure of reaction    Medications  (Not in a hospital admission)    Vitals   Vitals:   12/05/20 1330 12/05/20 1345 12/05/20 1400 12/05/20 1415  BP: (!) 148/78 (!) 160/93 (!) 158/95 (!) 164/100  Pulse: 71 64 65 63  Resp: 13 18 16 14   Temp:      TempSrc:      SpO2: 98% 98% 99% 98%  Weight:      Height:  Body mass index is 31 kg/m.  Physical Exam   General: Laying comfortably in bed; in no acute distress. HENT: Normal oropharynx and mucosa. Normal external appearance of ears and nose. Neck: Supple, no pain or tenderness CV: No JVD. No peripheral edema. Pulmonary: Symmetric Chest rise. Normal respiratory effort. Abdomen: Soft to touch, non-tender. Ext: No cyanosis, edema, or deformity Skin: No rash. Normal palpation of skin.  Musculoskeletal: Normal digits and nails by inspection. No clubbing.  Neurologic Examination  Mental status/Cognition: eyes closed, does not open eyes to voice or loud clap, resists eye opening. Does not answer questions or follow commands. Grimace to Qtip in nostril. Speech/language: mute Cranial nerves:  Pupils 68mm BL and reactive to light, noted to have increased tearing and redness of conjunctiva. EOMI: Unable to  assess as patient resists eye opening Face: symmetric facial grimace Left anterolateral tongue bite.  Motor:  Muscle bulk: normal, tone normal Moves all extremities spontaneously but  Not on commands.  Reflexes: Symmetric reflexes throughout  Sensation:  Light touch Withdraws to pain in all extremities.   Pin prick    Temperature    Vibration   Proprioception    Coordination/Complex Motor:  Unable to assess.  Labs   CBC:  Recent Labs  Lab 12/05/20 1508  HGB 15.3*  HCT 24.4    Basic Metabolic Panel:  Lab Results  Component Value Date   NA 139 12/05/2020   K 3.4 (L) 12/05/2020   CO2 26 10/30/2019   GLUCOSE 82 10/30/2019   BUN 17 10/30/2019   CREATININE 0.77 10/30/2019   CALCIUM 9.3 10/30/2019   GFRNONAA >60 10/30/2019   GFRAA >60 10/30/2019   Lipid Panel: No results found for: LDLCALC HgbA1c: No results found for: HGBA1C Urine Drug Screen:     Component Value Date/Time   LABOPIA NONE DETECTED 07/13/2014 0517   Blackgum DETECTED 07/13/2014 0517   LABBENZ NONE DETECTED 07/13/2014 0517   AMPHETMU NONE DETECTED 07/13/2014 0517   THCU POSITIVE (A) 07/13/2014 0517   LABBARB NONE DETECTED 07/13/2014 0517    Alcohol Level     Component Value Date/Time   ETH <5 06/28/2016 0758    CT Head without contrast: CTH was negative for a large hypodensity concerning for a large territory infarct or hyperdensity concerning for an ICH  rEEG: pending  Impression   [name] [name] is a 55 y.o. female with with PMH significant for complex partial seizure with secondary generalization who is on Depakote extended release tablet 500mg  BID who is admitted with acute onset AMS. With the noted tongue bite and found to have wet herself on presentation and a bruise on her cheek suspect that she had a seizure with prolonged post ictal period. However, she has been persistently encephalopathic in the ED for the last few hours, so we will get a cEEG to rule out  status. Unclear if she had a clear provoking factor thou. She does seem to have had close contact with family members with diagnosed influenza 4 days ago, appears to have a goiter on exam.  Recommendations  - CBC, CMP, UA, CXR, UDS, Ammonia, TSH, VPA levels, COVID testing and influenza testing pending. - Keppra 20mg /Kg IV once - cEEG - continue VPA 500mg  BID - Further AED management per cEEG. - seizure precautions - MRI Brain after cEEG if she continues to be encephalopathic  ______________________________________________________________________   Thank you for the opportunity to take part in the care of this patient. If you have any further questions,  please contact the neurology consultation attending.  Signed,  [place] Pager Number [phone] _ _ _   _ __   _ __ _ _  __ __   _ __   __ _

## 2020-12-06 ENCOUNTER — Observation Stay (HOSPITAL_COMMUNITY): Payer: Self-pay

## 2020-12-06 DIAGNOSIS — G9341 Metabolic encephalopathy: Secondary | ICD-10-CM | POA: Diagnosis present

## 2020-12-06 DIAGNOSIS — R569 Unspecified convulsions: Secondary | ICD-10-CM

## 2020-12-06 LAB — CBG MONITORING, ED
Glucose-Capillary: 65 mg/dL — ABNORMAL LOW (ref 70–99)
Glucose-Capillary: 78 mg/dL (ref 70–99)
Glucose-Capillary: 61 mg/dL — ABNORMAL LOW (ref 70–99)
Glucose-Capillary: 82 mg/dL (ref 70–99)
Glucose-Capillary: 83 mg/dL (ref 70–99)

## 2020-12-06 LAB — COMPREHENSIVE METABOLIC PANEL
ALT: 15 U/L (ref 0–44)
AST: 50 U/L — ABNORMAL HIGH (ref 15–41)
Albumin: 4 g/dL (ref 3.5–5.0)
Alkaline Phosphatase: 39 U/L (ref 38–126)
Anion gap: 13 (ref 5–15)
BUN: 12 mg/dL (ref 6–20)
CO2: 25 mmol/L (ref 22–32)
Calcium: 9.8 mg/dL (ref 8.9–10.3)
Chloride: 101 mmol/L (ref 98–111)
Creatinine, Ser: 0.99 mg/dL (ref 0.44–1.00)
GFR, Estimated: >60 mL/min (ref >60)
Glucose, Bld: 83 mg/dL (ref 70–99)
Potassium: 3.4 mmol/L — ABNORMAL LOW (ref 3.5–5.1)
Sodium: 139 mmol/L (ref 135–145)
Total Bilirubin: 0.6 mg/dL (ref 0.3–1.2)
Total Protein: 7.5 g/dL (ref 6.5–8.1)

## 2020-12-06 LAB — VALPROIC ACID LEVEL: Valproic Acid Lvl: 74 ug/mL (ref 50.0–100.0)

## 2020-12-06 LAB — CBC WITH DIFFERENTIAL/PLATELET
Abs Immature Granulocytes: 0.02 10*3/uL (ref 0.00–0.07)
Basophils Absolute: 0 10*3/uL (ref 0.0–0.1)
Basophils Relative: 0 %
Eosinophils Absolute: 0 10*3/uL (ref 0.0–0.5)
Eosinophils Relative: 0 %
HCT: 41.2 % (ref 36.0–46.0)
Hemoglobin: 13.5 g/dL (ref 12.0–15.0)
Immature Granulocytes: 0 %
Lymphocytes Relative: 15 %
Lymphs Abs: 1 10*3/uL (ref 0.7–4.0)
MCH: 29.9 pg (ref 26.0–34.0)
MCHC: 32.8 g/dL (ref 30.0–36.0)
MCV: 91.2 fL (ref 80.0–100.0)
Monocytes Absolute: 0.9 10*3/uL (ref 0.1–1.0)
Monocytes Relative: 13 %
Neutro Abs: 4.8 10*3/uL (ref 1.7–7.7)
Neutrophils Relative %: 72 %
Platelets: 204 10*3/uL (ref 150–400)
RBC: 4.52 MIL/uL (ref 3.87–5.11)
RDW: 13.1 % (ref 11.5–15.5)
WBC: 6.7 10*3/uL (ref 4.0–10.5)
nRBC: 0 % (ref 0.0–0.2)

## 2020-12-06 LAB — GLUCOSE, CAPILLARY: Glucose-Capillary: 111 mg/dL — ABNORMAL HIGH (ref 70–99)

## 2020-12-06 LAB — CBC
HCT: 41.2 % (ref 36.0–46.0)
Hemoglobin: 13.2 g/dL (ref 12.0–15.0)
MCH: 29.3 pg (ref 26.0–34.0)
MCHC: 32 g/dL (ref 30.0–36.0)
MCV: 91.4 fL (ref 80.0–100.0)
Platelets: 201 10*3/uL (ref 150–400)
RBC: 4.51 MIL/uL (ref 3.87–5.11)
RDW: 13 % (ref 11.5–15.5)
WBC: 7.7 10*3/uL (ref 4.0–10.5)
nRBC: 0 % (ref 0.0–0.2)

## 2020-12-06 LAB — D-DIMER, QUANTITATIVE: D-Dimer, Quant: 0.77 ug/mL-FEU — ABNORMAL HIGH (ref 0.00–0.50)

## 2020-12-06 LAB — CREATININE, SERUM
Creatinine, Ser: 0.94 mg/dL (ref 0.44–1.00)
GFR, Estimated: >60 mL/min (ref >60)

## 2020-12-06 LAB — HIV ANTIBODY (ROUTINE TESTING W REFLEX): HIV Screen 4th Generation wRfx: NONREACTIVE

## 2020-12-06 LAB — C-REACTIVE PROTEIN: CRP: 2.2 mg/dL — ABNORMAL HIGH (ref <1.0)

## 2020-12-06 MED ORDER — POTASSIUM CHLORIDE 10 MEQ/100ML IV SOLN
10.0000 meq | Freq: Once | INTRAVENOUS | Status: AC
  Administered 2020-12-06: 10 meq via INTRAVENOUS
  Filled 2020-12-06: qty 100

## 2020-12-06 MED ORDER — LORAZEPAM 2 MG/ML IJ SOLN
2.0000 mg | Freq: Once | INTRAMUSCULAR | Status: AC
  Administered 2020-12-06: 2 mg via INTRAVENOUS
  Filled 2020-12-06: qty 1

## 2020-12-06 MED ORDER — HYDRALAZINE HCL 20 MG/ML IJ SOLN: 10.0000 mg | INTRAMUSCULAR | Status: DC | PRN

## 2020-12-06 MED ORDER — DEXTROSE 10 % IV SOLN: INTRAVENOUS | Status: DC

## 2020-12-06 MED ORDER — LABETALOL HCL 5 MG/ML IV SOLN: 5.0000 mg | Freq: Four times a day (QID) | INTRAVENOUS | Status: DC | PRN

## 2020-12-06 MED ORDER — DEXTROSE 50 % IV SOLN
1.0000 | Freq: Once | INTRAVENOUS | Status: AC
  Administered 2020-12-06: 50 mL via INTRAVENOUS
  Filled 2020-12-06: qty 50

## 2020-12-06 MED ORDER — SODIUM CHLORIDE 0.9 % IV SOLN
2000.0000 mg | Freq: Once | INTRAVENOUS | Status: AC
  Administered 2020-12-06: 2000 mg via INTRAVENOUS
  Filled 2020-12-06: qty 20

## 2020-12-06 MED ORDER — LEVETIRACETAM IN NACL 1000 MG/100ML IV SOLN
1000.0000 mg | Freq: Two times a day (BID) | INTRAVENOUS | Status: DC
  Administered 2020-12-06 – 2020-12-08 (×4): 1000 mg via INTRAVENOUS
  Filled 2020-12-06 (×4): qty 100

## 2020-12-06 MED ORDER — VALPROATE SODIUM 100 MG/ML IV SOLN
2000.0000 mg | Freq: Once | INTRAVENOUS | Status: DC
  Filled 2020-12-06: qty 20

## 2020-12-06 MED ORDER — KCL IN DEXTROSE-NACL 20-5-0.45 MEQ/L-%-% IV SOLN
INTRAVENOUS | Status: DC
  Filled 2020-12-06 (×2): qty 1000

## 2020-12-06 MED ORDER — VALPROATE SODIUM 100 MG/ML IV SOLN
500.0000 mg | Freq: Two times a day (BID) | INTRAVENOUS | Status: DC
  Administered 2020-12-06 – 2020-12-08 (×5): 500 mg via INTRAVENOUS
  Filled 2020-12-06 (×7): qty 5

## 2020-12-06 NOTE — ED Notes (Signed)
Updated pts family on pt's care. Pt was able to see family on facetime. Pt responds to name, but does not speak and goes back to sleep.

## 2020-12-06 NOTE — Progress Notes (Signed)
[facility] PROGRESS NOTE    RUTHEL MARTINE  OEV:[id number] DOB: [birth year] DOA: 12/05/2020 PCP: Patient, No Pcp Per      Brief Narrative:  Mrs. Foell is a 55 y.o. F with epilepsy who presented with confusion brought by family.   In the ER patient is found to have a tongue bite on exam by the neurologist CT it was unremarkable.  Patient remained confused lethargic but arousable.  Temperature was 99.4 otherwise no leukocytosis CK was 534 ammonia level is 21 and valproic acid was therapeutic.  On-call neurologist was consulted and patient was given Keppra loading dose and started on Depakote IV.  Patient admitted for encephalopathy and continuous EEG monitoring.  Patient was found to be Covid test positive but not hypoxic.      Assessment & Plan:  Acute metabolic encephalopathy due to COVID, post-ictal state  Admitted and neurology consulted.  Loaded with IV anti-epileptics and placed on continuous EEG.    Today, still with PLEDs, Neurology increasing AEDs.  Still confused.    COVID present, but do not suspect superimposed bacterial infection.  Hypokalemia mild, no other severe electrolyte derangement, no hepatic disease, do not suspect toxic ingestion.  Query stroke, but MRI incompatible with EEG which is of primary urgency.  -Continue Depakote, Keppra -Consult Neurology, appreciate cares -Continue cEEG and titrate AEDs as needed  -MRI brain when off EEG  -Continue IV fluids since unable to take PO   COVID-19 This is an incidental finding.  CXR shows mild pneumonia.  Given she is asymptomatic, data do not support the use of remdesivir or steroids.   Hypertensive urgency BP 170s, not on meds at home -IV labetalol PRN for severe range hypertension  Hypokalemia -Supplement K  Hypoglycemia Unclear cause.   BP elevated, electrolytes normal, do not suspect Addison's. -Start IV dextrose infusion -Trend accuchecks      Disposition: Status is:  Inpatient  Remains inpatient appropriate because:Altered mental status   Dispo: The patient is from: Home              Anticipated d/c is to: Home              Anticipated d/c date is: > 3 days              Patient currently is not medically stable to d/c.                  MDM: The below labs and imaging reports were reviewed and summarized above.  Medication management as above.  Persistent acute delirium   DVT prophylaxis: heparin injection 5,000 Units Start: 12/05/20 2330  Code Status: FULL Family Communication: called daughter by phone, no answer    Consultants:   Neurology  Procedures:   1/10 -- cEEG        Subjective: Patient still densely encephalopathic.  No fever.  No vomiting.    Objective: Vitals:   12/06/20 1417 12/06/20 1422 12/06/20 1500 12/06/20 1515  BP:  100/71 (!) 154/99   Pulse:  77 (!) 39   Resp:  (!) 23  19  Temp: 98 F (36.7 C)     TempSrc: Oral     SpO2:  96%  100%  Weight:      Height:        Intake/Output Summary (Last 24 hours) at 12/06/2020 1555 Last data filed at 12/06/2020 0641 Gross per 24 hour  Intake 655 ml  Output --  Net 655 ml   Danley Danker  Weights   12/05/20 1236  Weight: 72 kg    Examination: General appearance:  adult female, somnolent, opens eyes briefly then falls back asleep HEENT: Anicteric, conjunctiva pink, lids and lashes normal. No nasal deformity, discharge, epistaxis.  Lips moist, does not open mouth for me.   Skin: Warm and dry.  No jaundice.  No suspicious rashes or lesions. Cardiac: RRR, nl S1-S2, no murmurs appreciated.  No LE edema.  Radial pulses 2+ and symmetric. Respiratory: Normal respiratory rate and rhythm.  CTAB without rales or wheezes. Abdomen: Abdomen soft.  Limited exam as will not cooperate with exam, no obviousl distension, no obvious pain MSK: No deformities or effusions. Neuro: Somnolent, does not follow commands.  Mumbles inappropriate phrase, once or twice, does not  answer quesitons, no spontaneous verbalizations.    Psych:      Data Reviewed: I have personally reviewed following labs and imaging studies:  CBC: Recent Labs  Lab 12/05/20 1441 12/05/20 1508 12/06/20 0122 12/06/20 0514  WBC 8.4  --  7.7 6.7  NEUTROABS 7.4  --   --  4.8  HGB 13.8 15.3* 13.2 13.5  HCT 42.6 45.0 41.2 41.2  MCV 92.6  --  91.4 91.2  PLT 184  --  201 0000000   Basic Metabolic Panel: Recent Labs  Lab 12/05/20 1441 12/05/20 1508 12/06/20 0122 12/06/20 0514  NA 137 139  --  139  K 3.5 3.4*  --  3.4*  CL 99  --   --  101  CO2 24  --   --  25  GLUCOSE 93  --   --  83  BUN 13  --   --  12  CREATININE 1.01*  --  0.94 0.99  CALCIUM 9.5  --   --  9.8   GFR: Estimated Creatinine Clearance: 58.9 mL/min (by C-G formula based on SCr of 0.99 mg/dL). Liver Function Tests: Recent Labs  Lab 12/05/20 1441 12/06/20 0514  AST 33 50*  ALT 11 15  ALKPHOS 39 39  BILITOT 0.4 0.6  PROT 7.4 7.5  ALBUMIN 3.9 4.0   No results for input(s): LIPASE, AMYLASE in the last 168 hours. Recent Labs  Lab 12/05/20 1441  AMMONIA 21   Coagulation Profile: No results for input(s): INR, PROTIME in the last 168 hours. Cardiac Enzymes: Recent Labs  Lab 12/05/20 1441  CKTOTAL 534*   BNP (last 3 results) No results for input(s): PROBNP in the last 8760 hours. HbA1C: No results for input(s): HGBA1C in the last 72 hours. CBG: Recent Labs  Lab 12/05/20 1237 12/06/20 1001 12/06/20 1154 12/06/20 1354 12/06/20 1514  GLUCAP 100* 65* 78 61* 82   Lipid Profile: No results for input(s): CHOL, HDL, LDLCALC, TRIG, CHOLHDL, LDLDIRECT in the last 72 hours. Thyroid Function Tests: Recent Labs    12/05/20 2006  TSH 0.541   Anemia Panel: No results for input(s): VITAMINB12, FOLATE, FERRITIN, TIBC, IRON, RETICCTPCT in the last 72 hours. Urine analysis:    Component Value Date/Time   COLORURINE YELLOW 12/05/2020 2154   APPEARANCEUR CLEAR 12/05/2020 2154   LABSPEC 1.008 12/05/2020  2154   PHURINE 6.0 12/05/2020 2154   GLUCOSEU NEGATIVE 12/05/2020 2154   HGBUR NEGATIVE 12/05/2020 2154   BILIRUBINUR NEGATIVE 12/05/2020 2154   KETONESUR 5 (A) 12/05/2020 2154   PROTEINUR NEGATIVE 12/05/2020 2154   UROBILINOGEN 1.0 02/05/2014 1852   NITRITE NEGATIVE 12/05/2020 2154   LEUKOCYTESUR SMALL (A) 12/05/2020 2154   Sepsis Labs: @LABRCNTIP (procalcitonin:4,lacticacidven:4)  ) Recent Results (from  the past 240 hour(s))  SARS CORONAVIRUS 2 (TAT 6-24 HRS) Nasopharyngeal Nasopharyngeal Swab     Status: Abnormal   Collection Time: 12/05/20  2:41 PM   Specimen: Nasopharyngeal Swab  Result Value Ref Range Status   SARS Coronavirus 2 POSITIVE (A) NEGATIVE Final    Comment: (NOTE) SARS-CoV-2 target nucleic acids are DETECTED.  The SARS-CoV-2 RNA is generally detectable in upper and lower respiratory specimens during the acute phase of infection. Positive results are indicative of the presence of SARS-CoV-2 RNA. Clinical correlation with patient history and other diagnostic information is  necessary to determine patient infection status. Positive results do not rule out bacterial infection or co-infection with other viruses.  The expected result is Negative.  Fact Sheet for Patients: SugarRoll.be  Fact Sheet for Healthcare Providers: https://www.woods-mathews.com/  This test is not yet approved or cleared by the [place] FDA and  has been authorized for detection and/or diagnosis of SARS-CoV-2 by FDA under an Emergency Use Authorization (EUA). This EUA will remain  in effect (meaning this test can be used) for the duration of the COVID-19 declaration under Section 564(b)(1) of the Act, 21 U. S.C. section 360bbb-3(b)(1), unless the authorization is terminated or revoked sooner.   Performed at [facility], [place] [address], [place], [place] [address]          Radiology Studies: CT HEAD WO CONTRAST  Result Date:  12/06/2020 CLINICAL DATA:  Mental status change, persistent or worsening. EXAM: CT HEAD WITHOUT CONTRAST TECHNIQUE: Contiguous axial images were obtained from the base of the skull through the vertex without intravenous contrast. COMPARISON:  Yesterday FINDINGS: Brain: No evidence of acute infarction, hemorrhage, hydrocephalus, extra-axial collection or mass lesion/mass effect. Superior cerebellar atrophy which may be related to history of seizures. Vascular: No hyperdense vessel or unexpected calcification. Skull: Normal. Negative for fracture or focal lesion. Sinuses/Orbits: No acute finding. Other: Motion degraded at the level of the caudal brain. IMPRESSION: Stable motion degraded head CT.  No acute finding. Electronically Signed   By: Monte Fantasia M.D.   On: 12/06/2020 06:08   CT Head Wo Contrast  Result Date: 12/05/2020 CLINICAL DATA:  Mental status change EXAM: CT HEAD WITHOUT CONTRAST TECHNIQUE: Contiguous axial images were obtained from the base of the skull through the vertex without intravenous contrast. COMPARISON:  11/24/2018 FINDINGS: Brain: No evidence of acute infarction, hemorrhage, hydrocephalus, extra-axial collection or mass lesion/mass effect. Vascular: No hyperdense vessel or unexpected calcification. Skull: Normal. Negative for fracture or focal lesion. Sinuses/Orbits: No acute finding. Other: None. IMPRESSION: Normal head CT without contrast for age Electronically Signed   By: Jerilynn Mages.  Shick M.D.   On: 12/05/2020 14:01   DG CHEST PORT 1 VIEW  Result Date: 12/06/2020 CLINICAL DATA:  COVID-19. EXAM: PORTABLE CHEST 1 VIEW COMPARISON:  02/08/2017 FINDINGS: Limited positioning in the setting of confusion. Prominent heart size but accentuated by rotation and portable technique. Hazy density at the bases which could be infiltrate or atelectasis. No edema, visible effusion, or pneumothorax. IMPRESSION: Limited chest with hazy infiltrate or atelectasis at the bases. Electronically Signed   By:  Monte Fantasia M.D.   On: 12/06/2020 05:02   DG Abd Portable 1 View  Result Date: 12/06/2020 CLINICAL DATA:  Concern for potential metallic foreign body. Altered mental status EXAM: PORTABLE ABDOMEN - 1 VIEW COMPARISON:  None. FINDINGS: No radiopaque foreign body appreciable in visualized portions of abdomen. No bowel dilatation or air-fluid level to suggest bowel obstruction. No free air. Visualized lung  bases clear. IMPRESSION: No bowel obstruction or free air. No radiopaque foreign body within the visualized portions of abdomen. Portion of abdomen toward the right not visualized due to difficulty in patient positioning. Electronically Signed   By: Lowella Grip III M.D.   On: 12/06/2020 08:52        Scheduled Meds: . heparin  5,000 Units Subcutaneous Q8H   Continuous Infusions: . dextrose 100 mL/hr at 12/06/20 1412  . levETIRAcetam    . valproate sodium Stopped (12/06/20 1111)     LOS: 0 days    Time spent: 35 minutes    Edwin Dada, MD [facility] 12/06/2020, 3:55 PM     Please page though [place] or Epic secure chat:  For [employer], Adult nurse

## 2020-12-06 NOTE — ED Notes (Signed)
Attempted report x1. 

## 2020-12-06 NOTE — Progress Notes (Signed)
RE-evaluated patient, now with gaze deviation, not following commands. She has purposeful movements bilaterally. I have high concern for ongoing seizure and will treat with ativan 2mg  IV x 1. Also will load with keppra 2g x 1. Repeat head Ct.   Roland Rack, MD [facility] [phone]  If 7pm- 7am, please page neurology on call as listed in [place].

## 2020-12-06 NOTE — Progress Notes (Signed)
Neurology Progress Note   S:// Patient seen and examined.  Still in the ER at this time. Hooked up to long-term EEG. Preliminary read of the long-term EEG-left anterior temporal spikes along with at times PLEDs-like appearance.   O:// Current vital signs: BP (!) 162/84   Pulse 72   Temp 99.7 F (37.6 C) (Temporal)   Resp (!) 21   Ht 5' (1.524 m)   Wt 72 kg   LMP 03/08/2016   SpO2 100%   BMI 31.00 kg/m  Vital signs in last 24 hours: Temp:  [98.3 F (36.8 C)-99.7 F (37.6 C)] 99.7 F (37.6 C) (01/10 0507) Pulse Rate:  [26-89] 72 (01/10 0845) Resp:  [13-23] 21 (01/10 0900) BP: (112-174)/(75-114) 162/84 (01/10 0900) SpO2:  [95 %-100 %] 100 % (01/10 0845) Weight:  [72 kg] 72 kg (01/09 1236) General: Drowsy, opens eyes to voice. HEENT: Normocephalic atraumatic Lungs: Clear Cardiovascular: Regular rate rhythm Abdomen soft nondistended nontender Extremities warm well perfused Neurological exam She is drowsy, opens eyes to voice, does not follow any commands.  Does not track the examiner consistently but at times does appear to track. Does not follow any commands No verbal output Cranial nerves: Pupils equal round react light, extraocular movements intact, visual fields appear full to threat, face appears symmetric. Motor exam: Equal withdrawal to noxious stimulation in all fours. Sensory: Grimaces and yells out in pain to noxious stimulation in all fours Coordination difficult to assess given her mentation    Medications  Current Facility-Administered Medications:  .  0.9 %  sodium chloride infusion, , Intravenous, Continuous, Rise Patience, MD, Last Rate: 75 mL/hr at 12/06/20 0110, New Bag at 12/06/20 0110 .  acetaminophen (TYLENOL) tablet 650 mg, 650 mg, Oral, Q6H PRN **OR** acetaminophen (TYLENOL) suppository 650 mg, 650 mg, Rectal, Q6H PRN, Rise Patience, MD .  heparin injection 5,000 Units, 5,000 Units, Subcutaneous, Q8H, Rise Patience, MD,  5,000 Units at 12/06/20 [id number] .  hydrALAZINE (APRESOLINE) injection 10 mg, 10 mg, Intravenous, Q4H PRN, Rise Patience, MD .  valproate (DEPACON) 500 mg in dextrose 5 % 50 mL IVPB, 500 mg, Intravenous, Q12H, Amie Portland, MD  Current Outpatient Medications:  .  divalproex (DEPAKOTE ER) 500 MG 24 hr tablet, Take 2 tablets (1,000 mg total) by mouth daily. (Patient taking differently: Take 500 mg by mouth in the morning and at bedtime.), Disp: 60 tablet, Rfl: 1 .  albuterol (PROVENTIL HFA;VENTOLIN HFA) 108 (90 Base) MCG/ACT inhaler, Inhale 2 puffs into the lungs every 6 (six) hours as needed for wheezing or shortness of breath. (Patient not taking: No sig reported), Disp: 1 Inhaler, Rfl: 2 .  lisinopril-hydrochlorothiazide (ZESTORETIC) 10-12.5 MG tablet, Take 2 tablets by mouth daily. To lower blood pressure (Patient not taking: Reported on 12/05/2020), Disp: 60 tablet, Rfl: 2 .  mupirocin ointment (BACTROBAN) 2 %, Apply to AA bid prn (Patient not taking: No sig reported), Disp: 22 g, Rfl: 0 .  rivaroxaban (XARELTO) 20 MG TABS tablet, Take 1 tablet (20 mg total) by mouth daily with supper. Start after completing the started pack (Patient not taking: No sig reported), Disp: 30 tablet, Rfl: 0 .  SUMAtriptan (IMITREX) 50 MG tablet, Take 1 tablet (50 mg total) by mouth every 2 (two) hours as needed for migraine. May repeat in 2 hours if headache persists or recurs. (Patient not taking: No sig reported), Disp: 10 tablet, Rfl: 2 Labs CBC    Component Value Date/Time   WBC 6.7 12/06/2020 0514  RBC 4.52 12/06/2020 0514   HGB 13.5 12/06/2020 0514   HGB 12.4 01/01/2018 1519   HCT 41.2 12/06/2020 0514   HCT 40.0 01/01/2018 1519   PLT 204 12/06/2020 0514   PLT 281 01/01/2018 1519   MCV 91.2 12/06/2020 0514   MCV 90 01/01/2018 1519   MCH 29.9 12/06/2020 0514   MCHC 32.8 12/06/2020 0514   RDW 13.1 12/06/2020 0514   RDW 13.9 01/01/2018 1519   LYMPHSABS 1.0 12/06/2020 0514   MONOABS 0.9 12/06/2020  0514   EOSABS 0.0 12/06/2020 0514   BASOSABS 0.0 12/06/2020 0514    CMP     Component Value Date/Time   NA 139 12/06/2020 0514   NA 142 01/01/2018 1519   K 3.4 (L) 12/06/2020 0514   CL 101 12/06/2020 0514   CO2 25 12/06/2020 0514   GLUCOSE 83 12/06/2020 0514   BUN 12 12/06/2020 0514   BUN 19 01/01/2018 1519   CREATININE 0.99 12/06/2020 0514   CALCIUM 9.8 12/06/2020 0514   PROT 7.5 12/06/2020 0514   PROT 7.5 01/01/2018 1519   ALBUMIN 4.0 12/06/2020 0514   ALBUMIN 4.4 01/01/2018 1519   AST 50 (H) 12/06/2020 0514   ALT 15 12/06/2020 0514   ALKPHOS 39 12/06/2020 0514   BILITOT 0.6 12/06/2020 0514   BILITOT 0.2 01/01/2018 1519   GFRNONAA >60 12/06/2020 0514   GFRAA >60 10/30/2019 1656    glycosylated hemoglobin  Lipid Panel  No results found for: CHOL, TRIG, HDL, CHOLHDL, VLDL, LDLCALC, LDLDIRECT   Imaging I have reviewed images in epic and the results pertinent to this consultation are: Initial CT head unremarkable  Assessment: 55 year old woman past medical history significant for complex partial seizure with secondary generalization, on Depakote at home, admitted with new onset altered mental status.  Found to have tongue bite and also had bladder incontinence upon presentation.  Presumption that she had a seizure with prolonged postictal state.  Loaded with Keppra and Depakote.  Remains encephalopathic. Continuous EEG shows left anterior temporal spikes at times with PLEDs like pattern. Due to her consistently poor mentation, would continue LTM EEG and also would escalate antiepileptics. Of note, she also was positive for COVID 19  Impression: -Breakthrough seizure -COVID-19 infection could have lowered seizure threshold -Multifactorial toxic metabolic encephalopathy -Evaluate for stroke-given recent COVID-19 infection  Recommendations: -I would recommend continuing Keppra.  Already loaded last night.  Start 1000 mg twice daily. -I would also continue her  Depakote-changed to IV as she is still unable to take p.o. medications. -Continuous EEG -We will continue to monitor from a neurological perspective -Supportive treatment and COVID-19 treatment per primary team as you are. -At some point.  Currently has none MRI safe leads for EEG.  We will hold off on the MRI till tomorrow morning.  D/W Dr. Loleta Books -- Amie Portland, MD Neurologist [facility] Pager: [phone]

## 2020-12-06 NOTE — Progress Notes (Signed)
EEG with no ongoing seizure, just generalized encephalopathy. will continue to monitor.   Roland Rack, MD [facility] [phone]  If 7pm- 7am, please page neurology on call as listed in [place].

## 2020-12-06 NOTE — ED Notes (Signed)
CRITICAL VALUE ALERT  Critical Value:  CBG 61  Date & Time Notied:  12/06/2020  1354   Provider Notified: Dr. Loleta Books

## 2020-12-06 NOTE — ED Notes (Signed)
Pt continues to be confused and lethargic, pt trying to climb out of the bed, disoriented x 4, taking off gown and monitor.

## 2020-12-06 NOTE — Plan of Care (Signed)
  Problem: Health Behavior/Discharge Planning: Goal: Ability to manage health-related needs will improve Outcome: Not Progressing   Problem: Clinical Measurements: Goal: Ability to maintain clinical measurements within normal limits will improve Outcome: Not Progressing   Problem: Nutrition: Goal: Adequate nutrition will be maintained Outcome: Not Progressing   Problem: Coping: Goal: Level of anxiety will decrease Outcome: Not Progressing   Problem: Education: Goal: Knowledge of risk factors and measures for prevention of condition will improve Outcome: Not Progressing   Problem: Respiratory: Goal: Will maintain a patent airway Outcome: Not Progressing Goal: Complications related to the disease process, condition or treatment will be avoided or minimized Outcome: Not Progressing

## 2020-12-06 NOTE — Progress Notes (Signed)
STAT LTM EEG hooked up and running - no initial skin breakdown - push button tested - neuro notified.  

## 2020-12-06 NOTE — Procedures (Signed)
Patient Name: KEYETTA HOLLINGWORTH  MRN: [id number]  Epilepsy Attending: Lora Havens  Referring Physician/Provider: Dr Gean Birchwood Date: 12/06/2020 0600 to 12/07/2020 0600  Patient history: 55 year old female with history of epilepsy presenting with altered mental status.  EEG to eval for seizures.  Level of alertness: lethargicm asleep  AEDs during EEG study: Depakote, LEV  Technical aspects: This EEG study was done with scalp electrodes positioned according to the 10-20 International system of electrode placement. Electrical activity was acquired at a sampling rate of 500Hz  and reviewed with a high frequency filter of 70Hz  and a low frequency filter of 1Hz . EEG data were recorded continuously and digitally stored.   Description: No clear posterior dominant rhythm was seen.  Sleep was characterized by sleep spindles (12 to 14 Hz), maximal frontocentral region.  EEG showed continuous generalized 3 to 5 Hz theta-delta slowing.  Abundant left anterior temporal polyspikes were noted which at times were PLED like at 0.5Hz .   After Keppra was added the frequency of spikes improved and did not appear to be PLED like anymore.     ABNORMALITY -Polyspikes, left anterior temporal region -Continued slow, generalized  IMPRESSION: This study showed evidence of epileptogenicity arising from left anterior temporal region as well as moderate diffuse encephalopathy, nonspecific etiology but likely secondary to ictal-postictal state.  No seizures were seen throughout the recording.    [place] Barbra Sarks

## 2020-12-06 NOTE — Progress Notes (Signed)
Arrived to room pt not in room pt in CT

## 2020-12-07 ENCOUNTER — Inpatient Hospital Stay (HOSPITAL_COMMUNITY): Payer: Self-pay

## 2020-12-07 LAB — COMPREHENSIVE METABOLIC PANEL
ALT: 19 U/L (ref 0–44)
AST: 61 U/L — ABNORMAL HIGH (ref 15–41)
Albumin: 3.3 g/dL — ABNORMAL LOW (ref 3.5–5.0)
Alkaline Phosphatase: 36 U/L — ABNORMAL LOW (ref 38–126)
Anion gap: 11 (ref 5–15)
BUN: 7 mg/dL (ref 6–20)
CO2: 26 mmol/L (ref 22–32)
Calcium: 9.5 mg/dL (ref 8.9–10.3)
Chloride: 100 mmol/L (ref 98–111)
Creatinine, Ser: 0.86 mg/dL (ref 0.44–1.00)
GFR, Estimated: >60 mL/min (ref >60)
Glucose, Bld: 90 mg/dL (ref 70–99)
Potassium: 3.3 mmol/L — ABNORMAL LOW (ref 3.5–5.1)
Sodium: 137 mmol/L (ref 135–145)
Total Bilirubin: 0.3 mg/dL (ref 0.3–1.2)
Total Protein: 6.4 g/dL — ABNORMAL LOW (ref 6.5–8.1)

## 2020-12-07 LAB — CBC WITH DIFFERENTIAL/PLATELET
Abs Immature Granulocytes: 0.02 10*3/uL (ref 0.00–0.07)
Basophils Absolute: 0 10*3/uL (ref 0.0–0.1)
Basophils Relative: 0 %
Eosinophils Absolute: 0 10*3/uL (ref 0.0–0.5)
Eosinophils Relative: 0 %
HCT: 42.1 % (ref 36.0–46.0)
Hemoglobin: 14.1 g/dL (ref 12.0–15.0)
Immature Granulocytes: 0 %
Lymphocytes Relative: 22 %
Lymphs Abs: 1.4 10*3/uL (ref 0.7–4.0)
MCH: 30.2 pg (ref 26.0–34.0)
MCHC: 33.5 g/dL (ref 30.0–36.0)
MCV: 90.1 fL (ref 80.0–100.0)
Monocytes Absolute: 1 10*3/uL (ref 0.1–1.0)
Monocytes Relative: 16 %
Neutro Abs: 3.8 10*3/uL (ref 1.7–7.7)
Neutrophils Relative %: 62 %
Platelets: 175 10*3/uL (ref 150–400)
RBC: 4.67 MIL/uL (ref 3.87–5.11)
RDW: 13.1 % (ref 11.5–15.5)
WBC: 6.1 10*3/uL (ref 4.0–10.5)
nRBC: 0 % (ref 0.0–0.2)

## 2020-12-07 LAB — GLUCOSE, CAPILLARY
Glucose-Capillary: 83 mg/dL (ref 70–99)
Glucose-Capillary: 78 mg/dL (ref 70–99)
Glucose-Capillary: 164 mg/dL — ABNORMAL HIGH (ref 70–99)
Glucose-Capillary: 92 mg/dL (ref 70–99)
Glucose-Capillary: 95 mg/dL (ref 70–99)

## 2020-12-07 LAB — C-REACTIVE PROTEIN: CRP: 1.4 mg/dL — ABNORMAL HIGH (ref <1.0)

## 2020-12-07 LAB — D-DIMER, QUANTITATIVE: D-Dimer, Quant: 0.38 ug/mL-FEU (ref 0.00–0.50)

## 2020-12-07 MED ORDER — AMLODIPINE BESYLATE 5 MG PO TABS
5.0000 mg | ORAL_TABLET | Freq: Every day | ORAL | Status: DC
  Administered 2020-12-07 – 2020-12-08 (×2): 5 mg via ORAL
  Filled 2020-12-07 (×2): qty 1

## 2020-12-07 MED ORDER — POTASSIUM CHLORIDE CRYS ER 20 MEQ PO TBCR
40.0000 meq | EXTENDED_RELEASE_TABLET | Freq: Once | ORAL | Status: AC
  Administered 2020-12-07: 40 meq via ORAL
  Filled 2020-12-07: qty 2

## 2020-12-07 MED ORDER — ENOXAPARIN SODIUM 40 MG/0.4ML ~~LOC~~ SOLN
40.0000 mg | SUBCUTANEOUS | Status: DC
  Administered 2020-12-07: 40 mg via SUBCUTANEOUS
  Filled 2020-12-07: qty 0.4

## 2020-12-07 MED ORDER — POTASSIUM CHLORIDE CRYS ER 20 MEQ PO TBCR: 40.0000 meq | EXTENDED_RELEASE_TABLET | Freq: Once | ORAL | Status: DC

## 2020-12-07 MED ORDER — KCL IN DEXTROSE-NACL 40-5-0.45 MEQ/L-%-% IV SOLN
INTRAVENOUS | Status: DC
  Administered 2020-12-07: 100 mL/h via INTRAVENOUS
  Filled 2020-12-07: qty 1000

## 2020-12-07 MED ORDER — DEXTROSE 50 % IV SOLN
1.0000 | Freq: Once | INTRAVENOUS | Status: AC
  Administered 2020-12-07: 50 mL via INTRAVENOUS
  Filled 2020-12-07: qty 50

## 2020-12-07 MED ORDER — GADOBUTROL 1 MMOL/ML IV SOLN
6.0000 mL | Freq: Once | INTRAVENOUS | Status: AC | PRN
  Administered 2020-12-07: 6 mL via INTRAVENOUS

## 2020-12-07 NOTE — Progress Notes (Signed)
Contacted about EEG monitor not recording by 3w nurse. Connection issue corrected,  all events accounted.

## 2020-12-07 NOTE — Progress Notes (Addendum)
Neurology Progress Note   S:// Patient seen and examined. Hooked up to long-term EEG. Overnight EEG with no seizures.   O:// Current vital signs: BP 137/83 (BP Location: Right Arm)   Pulse 63   Temp 97.9 F (36.6 C) (Oral)   Resp 18   Ht 5' (1.524 m)   Wt 72 kg   LMP 03/08/2016   SpO2 97%   BMI 31.00 kg/m  Vital signs in last 24 hours: Temp:  [97.9 F (36.6 C)-99.5 F (37.5 C)] 97.9 F (36.6 C) (01/11 0848) Pulse Rate:  [39-77] 63 (01/11 0848) Resp:  [7-29] 18 (01/11 0848) BP: (100-159)/(71-99) 137/83 (01/11 0848) SpO2:  [92 %-100 %] 97 % (01/11 0848) General: Drowsy, opens eyes to voice. HEENT: Normocephalic atraumatic Lungs: Clear Cardiovascular: Regular rate rhythm Abdomen soft nondistended nontender Extremities warm well perfused Neurological exam She is awake, alert, when asking her name, she tells me her name. She keeps perseverating her name. On asking her another questions, she continues to keep telling me her name. I asked her son's name-she said its Ephram, and then to every other question she kept saying her son's name repeatedly. Poor attention concentration Speech not dysarthric Cannot assess for the ability to name.  Does not follow commands consistently. Cranial nerves: Pupils equal round react light, extraocular movements intact, visual fields full, face appears symmetric, tongue and palate midline. Motor exam: Antigravity in all 4 extremities without drift Sensation intact to noxious stimulation in all fours Difficult to assess coordination   Medications  Current Facility-Administered Medications:  .  acetaminophen (TYLENOL) tablet 650 mg, 650 mg, Oral, Q6H PRN **OR** acetaminophen (TYLENOL) suppository 650 mg, 650 mg, Rectal, Q6H PRN, Gean Birchwood N, MD .  dextrose 5 % and 0.45 % NaCl with KCl 40 mEq/L infusion, , Intravenous, Continuous, Danford, Suann Larry, MD .  heparin injection 5,000 Units, 5,000 Units, Subcutaneous, Q8H,  Rise Patience, MD, 5,000 Units at 12/07/20 0636 .  hydrALAZINE (APRESOLINE) injection 10 mg, 10 mg, Intravenous, Q4H PRN, Rise Patience, MD .  labetalol (NORMODYNE) injection 5 mg, 5 mg, Intravenous, Q6H PRN, Danford, Suann Larry, MD .  levETIRAcetam (KEPPRA) IVPB 1000 mg/100 mL premix, 1,000 mg, Intravenous, Q12H, Amie Portland, MD, Last Rate: 400 mL/hr at 12/07/20 0630, 1,000 mg at 12/07/20 0630 .  valproate (DEPACON) 500 mg in dextrose 5 % 50 mL IVPB, 500 mg, Intravenous, Q12H, Amie Portland, MD, Last Rate: 55 mL/hr at 12/06/20 2215, 500 mg at 12/06/20 2215 Labs CBC    Component Value Date/Time   WBC 6.1 12/07/2020 0524   RBC 4.67 12/07/2020 0524   HGB 14.1 12/07/2020 0524   HGB 12.4 01/01/2018 1519   HCT 42.1 12/07/2020 0524   HCT 40.0 01/01/2018 1519   PLT 175 12/07/2020 0524   PLT 281 01/01/2018 1519   MCV 90.1 12/07/2020 0524   MCV 90 01/01/2018 1519   MCH 30.2 12/07/2020 0524   MCHC 33.5 12/07/2020 0524   RDW 13.1 12/07/2020 0524   RDW 13.9 01/01/2018 1519   LYMPHSABS 1.4 12/07/2020 0524   MONOABS 1.0 12/07/2020 0524   EOSABS 0.0 12/07/2020 0524   BASOSABS 0.0 12/07/2020 0524    CMP     Component Value Date/Time   NA 137 12/07/2020 0524   NA 142 01/01/2018 1519   K 3.3 (L) 12/07/2020 0524   CL 100 12/07/2020 0524   CO2 26 12/07/2020 0524   GLUCOSE 90 12/07/2020 0524   BUN 7 12/07/2020 0524   BUN 19  01/01/2018 1519   CREATININE 0.86 12/07/2020 0524   CALCIUM 9.5 12/07/2020 0524   PROT 6.4 (L) 12/07/2020 0524   PROT 7.5 01/01/2018 1519   ALBUMIN 3.3 (L) 12/07/2020 0524   ALBUMIN 4.4 01/01/2018 1519   AST 61 (H) 12/07/2020 0524   ALT 19 12/07/2020 0524   ALKPHOS 36 (L) 12/07/2020 0524   BILITOT 0.3 12/07/2020 0524   BILITOT 0.2 01/01/2018 1519   GFRNONAA >60 12/07/2020 0524   GFRAA >60 10/30/2019 1656    glycosylated hemoglobin  Lipid Panel  No results found for: CHOL, TRIG, HDL, CHOLHDL, VLDL, LDLCALC, LDLDIRECT  Depakote level  12/06/2018 22-74  Imaging I have reviewed images in epic and the results pertinent to this consultation are: Initial CT head unremarkable  Assessment: 55 year old woman past medical history significant for complex partial seizure with secondary generalization, on Depakote at home, admitted with new onset altered mental status.  Found to have tongue bite and also had bladder incontinence upon presentation.  Presumption that she had a seizure with prolonged postictal state.  Loaded with Keppra and Depakote.  Remains encephalopathic. Continuous EEG shows left anterior temporal spikes at times with PLEDs like pattern. Due to her consistently poor mentation, would continue LTM EEG and also would escalate antiepileptics. Of note, she also was positive for COVID 19  Impression: -Breakthrough seizure -COVID-19 infection could have lowered seizure threshold -Multifactorial toxic metabolic encephalopathy -Evaluate for stroke-given recent COVID-19 infection  Recommendations: -Keppra 1000 mg twice daily. -I would also continue her Depakote-changed to IV as she is still unable to take p.o. medications.  When she is okay to take p.o., Depakote can be changed to p.o.-one-to-one conversion. -Continuous EEG can be discontinued -MRI brain after the EEG is discontinued -We will continue to monitor from a neurological perspective -Supportive treatment and COVID-19 treatment (infection likely incidental) per primary team as you are. -Also has had hypoglycemic episodes- management per primary team.  I spoke with the son over the phone.  Also spoke with aunt over the phone.  D/W Dr. Loleta Books -- Amie Portland, MD Neurologist [facility] Pager: [phone]   Addendum MRI negative for acute stroke. Will follow. -- Amie Portland, MD Neurologist [facility] Pager: [phone]

## 2020-12-07 NOTE — Progress Notes (Signed)
[facility] PROGRESS NOTE    Tammy [name]  UXL:[id number] DOB: Aug 18, 1966 DOA: 12/05/2020 PCP: Patient, No Pcp Per      Brief Narrative:  Tammy [name] is a 55 y.o. F with epilepsy who presented with confusion brought by family.   In the ER patient is found to have a tongue bite on exam by the neurologist CT it was unremarkable.  Patient remained confused lethargic but arousable.  Temperature was 99.4 otherwise no leukocytosis CK was 534 ammonia level is 21 and valproic acid was therapeutic.  On-call neurologist was consulted and patient was given Keppra loading dose and started on Depakote IV.  Patient admitted for encephalopathy and continuous EEG monitoring.  Patient was found to be Covid test positive but not hypoxic.      Assessment & Plan:  Acute metabolic encephalopathy due to COVID, post-ictal state  Admitted and neurology consulted.  Loaded with IV anti-epileptics and placed on continuous EEG.   COVID present, but do not suspect superimposed bacterial infection.  Hypokalemia mild, no other severe electrolyte derangement, no hepatic disease, do not suspect toxic ingestion.  Query stroke, but MRI incompatible with EEG which is of primary urgency.  Now improved substantially, but still somewhat confused.    MRI brain unremarkable  -Continue Keppra, Depakote -Consult Neurology, appreciate cares  Hypoglycemia Unclear what this was from.  Suspect poor PO intake and COVID.  Was on D10 infusion overnight for 16 hours, dropped to D5 1/2NS today, now resolved this afternoon since the patient is more alert. -Start diet -COntinue acchuchecks   COVID-19 This is an incidental finding.  CXR shows mild pneumonia.  Given she is asymptomatic, data do not support the use of remdesivir or steroids.   Hypertensive urgency Hypertension BP still elevated but better -Start amlodipine -IV labetalol PRN for severe range hypertension  Hypokalemia -Repeat K  supplement        Disposition: Status is: Inpatient  Remains inpatient appropriate because:Altered mental status resolving but still present   Dispo: The patient is from: Home              Anticipated d/c is to: Home              Anticipated d/c date is: 1 day              Patient currently is not medically stable to d/c.                  MDM: The below labs and imaging reports were reviewed and summarized above.  Medication management as above.     DVT prophylaxis: enoxaparin (LOVENOX) injection 40 mg Start: 12/07/20 1600  Code Status: FULL Family Communication: daughter by phone    Consultants:   Neurology  Procedures:   1/10 -- cEEG  1/11 -- MRI brain        Subjective: Patient still a little dazed, but sitting up, following commands, interactive, oriented.  No fever.  No vomiting.  No respiratory distress.  Has a little cough.       Objective: Vitals:   12/07/20 1200 12/07/20 1208 12/07/20 1600 12/07/20 1700  BP:  (!) 152/96    Pulse: (!) 42 (!) 41    Resp: 16 16 (!) 21 20  Temp:  97.9 F (36.6 C)    TempSrc:  Oral    SpO2: 100% 99%    Weight:      Height:        Intake/Output Summary (Last  24 hours) at 12/07/2020 1811 Last data filed at 12/07/2020 1700 Gross per 24 hour  Intake 1010.29 ml  Output 1450 ml  Net -439.71 ml   Filed Weights   12/05/20 1236  Weight: 72 kg    Examination: General appearance: Adult female, well-nourished, lying in bed, sits up and is interactive     HEENT: Anicteric, conjunctive are pink, lids and lashes normal.  No nasal deformity, discharge, or epistaxis.  Lips moist, dentition normal, oropharynx moist, no oral lesions, hearing normal Skin: No suspicious rashes or lesions on the face, neck, upper chest, back, or legs Cardiac: RRR, no murmurs, no lower extremity edema, pulses 2+ and symmetric Respiratory: Normal respiratory rate and rhythm, lungs clear without rales or wheezes Abdomen:  Abdomen soft, no tenderness palpation or guarding, no distention. MSK: Muscle bulk and tone. Neuro: Awake and alert, extraocular movements intact, moves all extremities with normal strength and coronation, speech fluent. Psych: Attention diminished, thinks that this 4 AM in the morning, no hallucinations.    Data Reviewed: I have personally reviewed following labs and imaging studies:  CBC: Recent Labs  Lab 12/05/20 1441 12/05/20 1508 12/06/20 0122 12/06/20 0514 12/07/20 0524  WBC 8.4  --  7.7 6.7 6.1  NEUTROABS 7.4  --   --  4.8 3.8  HGB 13.8 15.3* 13.2 13.5 14.1  HCT 42.6 45.0 41.2 41.2 42.1  MCV 92.6  --  91.4 91.2 90.1  PLT 184  --  201 204 0000000   Basic Metabolic Panel: Recent Labs  Lab 12/05/20 1441 12/05/20 1508 12/06/20 0122 12/06/20 0514 12/07/20 0524  NA 137 139  --  139 137  K 3.5 3.4*  --  3.4* 3.3*  CL 99  --   --  101 100  CO2 24  --   --  25 26  GLUCOSE 93  --   --  83 90  BUN 13  --   --  12 7  CREATININE 1.01*  --  0.94 0.99 0.86  CALCIUM 9.5  --   --  9.8 9.5   GFR: Estimated Creatinine Clearance: 67.8 mL/min (by C-G formula based on SCr of 0.86 mg/dL). Liver Function Tests: Recent Labs  Lab 12/05/20 1441 12/06/20 0514 12/07/20 0524  AST 33 50* 61*  ALT 11 15 19   ALKPHOS 39 39 36*  BILITOT 0.4 0.6 0.3  PROT 7.4 7.5 6.4*  ALBUMIN 3.9 4.0 3.3*   No results for input(s): LIPASE, AMYLASE in the last 168 hours. Recent Labs  Lab 12/05/20 1441  AMMONIA 21   Coagulation Profile: No results for input(s): INR, PROTIME in the last 168 hours. Cardiac Enzymes: Recent Labs  Lab 12/05/20 1441  CKTOTAL 534*   BNP (last 3 results) No results for input(s): PROBNP in the last 8760 hours. HbA1C: No results for input(s): HGBA1C in the last 72 hours. CBG: Recent Labs  Lab 12/06/20 2358 12/07/20 0402 12/07/20 0851 12/07/20 1208 12/07/20 1642  GLUCAP 111* 83 78 164* 92   Lipid Profile: No results for input(s): CHOL, HDL, LDLCALC, TRIG,  CHOLHDL, LDLDIRECT in the last 72 hours. Thyroid Function Tests: Recent Labs    12/05/20 2006  TSH 0.541   Anemia Panel: No results for input(s): VITAMINB12, FOLATE, FERRITIN, TIBC, IRON, RETICCTPCT in the last 72 hours. Urine analysis:    Component Value Date/Time   COLORURINE YELLOW 12/05/2020 2154   APPEARANCEUR CLEAR 12/05/2020 2154   LABSPEC 1.008 12/05/2020 2154   PHURINE 6.0 12/05/2020 2154   GLUCOSEU  NEGATIVE 12/05/2020 2154   HGBUR NEGATIVE 12/05/2020 2154   BILIRUBINUR NEGATIVE 12/05/2020 2154   KETONESUR 5 (A) 12/05/2020 2154   PROTEINUR NEGATIVE 12/05/2020 2154   UROBILINOGEN 1.0 02/05/2014 1852   NITRITE NEGATIVE 12/05/2020 2154   LEUKOCYTESUR SMALL (A) 12/05/2020 2154   Sepsis Labs: @LABRCNTIP (procalcitonin:4,lacticacidven:4)  ) Recent Results (from the past 240 hour(s))  SARS CORONAVIRUS 2 (TAT 6-24 HRS) Nasopharyngeal Nasopharyngeal Swab     Status: Abnormal   Collection Time: 12/05/20  2:41 PM   Specimen: Nasopharyngeal Swab  Result Value Ref Range Status   SARS Coronavirus 2 POSITIVE (A) NEGATIVE Final    Comment: (NOTE) SARS-CoV-2 target nucleic acids are DETECTED.  The SARS-CoV-2 RNA is generally detectable in upper and lower respiratory specimens during the acute phase of infection. Positive results are indicative of the presence of SARS-CoV-2 RNA. Clinical correlation with patient history and other diagnostic information is  necessary to determine patient infection status. Positive results do not rule out bacterial infection or co-infection with other viruses.  The expected result is Negative.  Fact Sheet for Patients: SugarRoll.be  Fact Sheet for Healthcare Providers: https://www.woods-mathews.com/  This test is not yet approved or cleared by the [place] FDA and  has been authorized for detection and/or diagnosis of SARS-CoV-2 by FDA under an Emergency Use Authorization (EUA). This EUA will  remain  in effect (meaning this test can be used) for the duration of the COVID-19 declaration under Section 564(b)(1) of the Act, 21 U. S.C. section 360bbb-3(b)(1), unless the authorization is terminated or revoked sooner.   Performed at [facility], [place] [address], [place], [place] [address]          Radiology Studies: CT HEAD WO CONTRAST  Result Date: 12/06/2020 CLINICAL DATA:  Mental status change, persistent or worsening. EXAM: CT HEAD WITHOUT CONTRAST TECHNIQUE: Contiguous axial images were obtained from the base of the skull through the vertex without intravenous contrast. COMPARISON:  Yesterday FINDINGS: Brain: No evidence of acute infarction, hemorrhage, hydrocephalus, extra-axial collection or mass lesion/mass effect. Superior cerebellar atrophy which may be related to history of seizures. Vascular: No hyperdense vessel or unexpected calcification. Skull: Normal. Negative for fracture or focal lesion. Sinuses/Orbits: No acute finding. Other: Motion degraded at the level of the caudal brain. IMPRESSION: Stable motion degraded head CT.  No acute finding. Electronically Signed   By: Monte Fantasia M.D.   On: 12/06/2020 06:08   MR BRAIN W WO CONTRAST  Result Date: 12/07/2020 CLINICAL DATA:  Acute neuro deficit.  COVID positive EXAM: MRI HEAD WITHOUT AND WITH CONTRAST TECHNIQUE: Multiplanar, multiecho pulse sequences of the brain and surrounding structures were obtained without and with intravenous contrast. CONTRAST:  8mL GADAVIST GADOBUTROL 1 MMOL/ML IV SOLN COMPARISON:  CT head 12/06/2020 FINDINGS: Brain: No acute infarction, hemorrhage, hydrocephalus, extra-axial collection or mass lesion. Mild white matter changes with scattered small deep white matter hyperintensities bilaterally. Image quality degraded by motion. No enhancing lesions postcontrast. Postcontrast images degraded by significant motion. Vascular: Normal arterial flow voids. Skull and upper cervical spine:  Negative Sinuses/Orbits: Mild mucosal edema paranasal sinuses. Negative orbit Other: None IMPRESSION: No acute infarct. Mild white matter changes consistent with chronic microvascular ischemia Motion degraded examination. Electronically Signed   By: Franchot Gallo M.D.   On: 12/07/2020 14:15   DG CHEST PORT 1 VIEW  Result Date: 12/06/2020 CLINICAL DATA:  COVID-19. EXAM: PORTABLE CHEST 1 VIEW COMPARISON:  02/08/2017 FINDINGS: Limited positioning in the setting of confusion. Prominent heart size but  accentuated by rotation and portable technique. Hazy density at the bases which could be infiltrate or atelectasis. No edema, visible effusion, or pneumothorax. IMPRESSION: Limited chest with hazy infiltrate or atelectasis at the bases. Electronically Signed   By: Monte Fantasia M.D.   On: 12/06/2020 05:02   DG Abd Portable 1 View  Result Date: 12/06/2020 CLINICAL DATA:  Concern for potential metallic foreign body. Altered mental status EXAM: PORTABLE ABDOMEN - 1 VIEW COMPARISON:  None. FINDINGS: No radiopaque foreign body appreciable in visualized portions of abdomen. No bowel dilatation or air-fluid level to suggest bowel obstruction. No free air. Visualized lung bases clear. IMPRESSION: No bowel obstruction or free air. No radiopaque foreign body within the visualized portions of abdomen. Portion of abdomen toward the right not visualized due to difficulty in patient positioning. Electronically Signed   By: Lowella Grip III M.D.   On: 12/06/2020 08:52   Overnight EEG with video  Result Date: 12/06/2020 Lora Havens, MD     12/07/2020  9:41 AM Patient Name: CHAYANNE KREFT MRN: [id number] Epilepsy Attending: Lora Havens Referring Physician/Provider: Dr Gean Birchwood Date: 12/06/2020 0600 to 12/07/2020 0600 Patient history: 55 year old female with history of epilepsy presenting with altered mental status.  EEG to eval for seizures. Level of alertness: lethargicm asleep AEDs during EEG study:  Depakote, LEV Technical aspects: This EEG study was done with scalp electrodes positioned according to the 10-20 International system of electrode placement. Electrical activity was acquired at a sampling rate of 500Hz  and reviewed with a high frequency filter of 70Hz  and a low frequency filter of 1Hz . EEG data were recorded continuously and digitally stored. Description: No clear posterior dominant rhythm was seen.  Sleep was characterized by sleep spindles (12 to 14 Hz), maximal frontocentral region.  EEG showed continuous generalized 3 to 5 Hz theta-delta slowing.  Abundant left anterior temporal polyspikes were noted which at times were PLED like at 0.5Hz .   After Keppra was added the frequency of spikes improved and did not appear to be PLED like anymore.   ABNORMALITY -Polyspikes, left anterior temporal region -Continued slow, generalized IMPRESSION: This study showed evidence of epileptogenicity arising from left anterior temporal region as well as moderate diffuse encephalopathy, nonspecific etiology but likely secondary to ictal-postictal state.  No seizures were seen throughout the recording. Priyanka Barbra Sarks        Scheduled Meds: . enoxaparin (LOVENOX) injection  40 mg Subcutaneous Q24H   Continuous Infusions: . levETIRAcetam 1,000 mg (12/07/20 1655)  . valproate sodium 500 mg (12/07/20 1100)     LOS: 1 day    Time spent: 25 minutes    Edwin Dada, MD [facility] 12/07/2020, 6:11 PM     Please page though [place] or Epic secure chat:  For [employer], Adult nurse

## 2020-12-07 NOTE — Procedures (Signed)
Patient Name: Tammy [name]  MRN: [id number]  Epilepsy Attending: Lora Havens  Referring Physician/Provider: Dr Gean Birchwood Date: 12/07/2020 0600 to 12/07/2020 1013  Patient history: 55 year old female with history of epilepsy presenting with altered mental status.  EEG to eval for seizures.  Level of alertness: lethargicm asleep  AEDs during EEG study: Depakote, LEV  Technical aspects: This EEG study was done with scalp electrodes positioned according to the 10-20 International system of electrode placement. Electrical activity was acquired at a sampling rate of 500Hz  and reviewed with a high frequency filter of 70Hz  and a low frequency filter of 1Hz . EEG data were recorded continuously and digitally stored.   Description: No clear posterior dominant rhythm was seen.  Sleep was characterized by sleep spindles (12 to 14 Hz), maximal frontocentral region.  EEG showed continuous generalized 3 to 5 Hz theta-delta slowing.  Abundant left anterior temporal polyspikes were noted which at times were PLED like at 0.5Hz .   After Keppra was added the frequency of spikes improved and did not appear to be PLED like anymore.     ABNORMALITY -Polyspikes, left anterior temporal region -Continued slow, generalized  IMPRESSION: This study showed evidence of epileptogenicity arising from left anterior temporal region as well as moderate diffuse encephalopathy, nonspecific etiology but likely secondary to ictal-postictal state.  No seizures were seen throughout the recording.    Carolene Gitto Barbra Sarks

## 2020-12-07 NOTE — Progress Notes (Signed)
LTM EEG discontinued - no skin breakdown at unhook.   

## 2020-12-07 NOTE — Plan of Care (Signed)
MRI completed today. EEG monitoring d/c'd. IV fluids d/c'd. Pt level of care downgraded. Okay to d/c telemetry (d/t special precautions deferred to nightshift). Pt able to tolerate regular diet. Glucose wdl. CHG bath given. PIV R hand intact. Pt has been able to facetime with sons w/ her cell phone.    Problem: Health Behavior/Discharge Planning: Goal: Ability to manage health-related needs will improve Outcome: Progressing   Problem: Clinical Measurements: Goal: Ability to maintain clinical measurements within normal limits will improve Outcome: Progressing Goal: Diagnostic test results will improve Outcome: Progressing Goal: Respiratory complications will improve Outcome: Progressing   Problem: Activity: Goal: Risk for activity intolerance will decrease Outcome: Progressing   Problem: Nutrition: Goal: Adequate nutrition will be maintained Outcome: Progressing   Problem: Coping: Goal: Level of anxiety will decrease Outcome: Progressing   Problem: Safety: Goal: Ability to remain free from injury will improve Outcome: Progressing   Problem: Skin Integrity: Goal: Risk for impaired skin integrity will decrease Outcome: Progressing   Problem: Education: Goal: Knowledge of risk factors and measures for prevention of condition will improve Outcome: Progressing   Problem: Coping: Goal: Psychosocial and spiritual needs will be supported Outcome: Progressing   Problem: Respiratory: Goal: Will maintain a patent airway Outcome: Progressing Goal: Complications related to the disease process, condition or treatment will be avoided or minimized Outcome: Progressing

## 2020-12-08 ENCOUNTER — Other Ambulatory Visit (HOSPITAL_COMMUNITY): Payer: Self-pay | Admitting: Internal Medicine

## 2020-12-08 LAB — CBC WITH DIFFERENTIAL/PLATELET
Abs Immature Granulocytes: 0.01 10*3/uL (ref 0.00–0.07)
Basophils Absolute: 0 10*3/uL (ref 0.0–0.1)
Basophils Relative: 1 %
Eosinophils Absolute: 0 10*3/uL (ref 0.0–0.5)
Eosinophils Relative: 0 %
HCT: 40.7 % (ref 36.0–46.0)
Hemoglobin: 13.7 g/dL (ref 12.0–15.0)
Immature Granulocytes: 0 %
Lymphocytes Relative: 45 %
Lymphs Abs: 2 10*3/uL (ref 0.7–4.0)
MCH: 30.2 pg (ref 26.0–34.0)
MCHC: 33.7 g/dL (ref 30.0–36.0)
MCV: 89.8 fL (ref 80.0–100.0)
Monocytes Absolute: 0.7 10*3/uL (ref 0.1–1.0)
Monocytes Relative: 16 %
Neutro Abs: 1.6 10*3/uL — ABNORMAL LOW (ref 1.7–7.7)
Neutrophils Relative %: 38 %
Platelets: 180 10*3/uL (ref 150–400)
RBC: 4.53 MIL/uL (ref 3.87–5.11)
RDW: 12.9 % (ref 11.5–15.5)
WBC: 4.3 10*3/uL (ref 4.0–10.5)
nRBC: 0 % (ref 0.0–0.2)

## 2020-12-08 LAB — GLUCOSE, CAPILLARY
Glucose-Capillary: 113 mg/dL — ABNORMAL HIGH (ref 70–99)
Glucose-Capillary: 84 mg/dL (ref 70–99)
Glucose-Capillary: 86 mg/dL (ref 70–99)
Glucose-Capillary: 91 mg/dL (ref 70–99)

## 2020-12-08 LAB — COMPREHENSIVE METABOLIC PANEL
ALT: 18 U/L (ref 0–44)
AST: 38 U/L (ref 15–41)
Albumin: 3.2 g/dL — ABNORMAL LOW (ref 3.5–5.0)
Alkaline Phosphatase: 75 U/L (ref 38–126)
Anion gap: 11 (ref 5–15)
BUN: 8 mg/dL (ref 6–20)
CO2: 27 mmol/L (ref 22–32)
Calcium: 9.4 mg/dL (ref 8.9–10.3)
Chloride: 102 mmol/L (ref 98–111)
Creatinine, Ser: 0.91 mg/dL (ref 0.44–1.00)
GFR, Estimated: >60 mL/min (ref >60)
Glucose, Bld: 92 mg/dL (ref 70–99)
Potassium: 3.5 mmol/L (ref 3.5–5.1)
Sodium: 140 mmol/L (ref 135–145)
Total Bilirubin: 0.7 mg/dL (ref 0.3–1.2)
Total Protein: 6.4 g/dL — ABNORMAL LOW (ref 6.5–8.1)

## 2020-12-08 LAB — C-REACTIVE PROTEIN: CRP: 1.2 mg/dL — ABNORMAL HIGH (ref <1.0)

## 2020-12-08 LAB — MAGNESIUM: Magnesium: 1.9 mg/dL (ref 1.7–2.4)

## 2020-12-08 LAB — D-DIMER, QUANTITATIVE: D-Dimer, Quant: 0.38 ug/mL-FEU (ref 0.00–0.50)

## 2020-12-08 MED ORDER — LEVETIRACETAM 1000 MG PO TABS: 1000.0000 mg | ORAL_TABLET | Freq: Two times a day (BID) | ORAL | Status: DC

## 2020-12-08 MED ORDER — DIVALPROEX SODIUM 125 MG PO CSDR
500.0000 mg | DELAYED_RELEASE_CAPSULE | Freq: Two times a day (BID) | ORAL | Status: DC
Start: 2020-12-08 — End: 2020-12-08

## 2020-12-08 MED ORDER — DIVALPROEX SODIUM 500 MG PO DR TAB: 500.0000 mg | DELAYED_RELEASE_TABLET | Freq: Two times a day (BID) | ORAL | 2 refills | Status: DC

## 2020-12-08 MED ORDER — AMLODIPINE BESYLATE 5 MG PO TABS: 5.0000 mg | ORAL_TABLET | Freq: Every day | ORAL | 1 refills | Status: DC

## 2020-12-08 MED FILL — AMLODIPINE BESYLATE 5 MG TA: 5 | 30 days supply | Qty: 30 | Fill #0

## 2020-12-08 MED FILL — DIVALPROEX SOD DR 500 MG TA: 500 | 30 days supply | Qty: 60 | Fill #0

## 2020-12-08 MED FILL — levETIRAcetam 1000 MG TABS: 1000 | 30 days supply | Qty: 60 | Fill #0

## 2020-12-08 NOTE — Progress Notes (Signed)
Neurology Progress Note   S:// Patient seen and examined. Much more awake and cooperative with exam.   O:// Current vital signs: BP 137/83 (BP Location: Right Arm)   Pulse 66   Temp 98.3 F (36.8 C) (Oral)   Resp 20   Ht 5' (1.524 m)   Wt 72 kg   LMP 03/08/2016   SpO2 97%   BMI 31.00 kg/m  Vital signs in last 24 hours: Temp:  [98 F (36.7 C)-98.8 F (37.1 C)] 98.3 F (36.8 C) (01/12 0900) Pulse Rate:  [61-66] 66 (01/12 0900) Resp:  [20-21] 20 (01/12 0900) BP: (120-137)/(76-89) 137/83 (01/12 0900) SpO2:  [97 %-98 %] 97 % (01/12 0612) General: Drowsy, opens eyes to voice. HEENT: Normocephalic atraumatic Lungs: Clear Cardiovascular: Regular rate rhythm Abdomen soft nondistended nontender Extremities warm well perfused Neurological exam She is awake, alert, oriented to self and the fact that she is in the hospital. She could tell me that this is January. She does not seem to recollect the past few days. Her speech is not dysarthric She has poor attention concentration Cranial nerves: 2-12 intact Motor exam with no weakness or drift in any of the four extremities with 5/5 strength all over Sensory exam intact to touch Coordination with no evidence of dysmetria Gait testing deferred at this time  Medications  Current Facility-Administered Medications:  .  acetaminophen (TYLENOL) tablet 650 mg, 650 mg, Oral, Q6H PRN **OR** acetaminophen (TYLENOL) suppository 650 mg, 650 mg, Rectal, Q6H PRN, Rise Patience, MD .  amLODipine (NORVASC) tablet 5 mg, 5 mg, Oral, Daily, Danford, Suann Larry, MD, 5 mg at 12/08/20 0919 .  enoxaparin (LOVENOX) injection 40 mg, 40 mg, Subcutaneous, Q24H, Danford, Suann Larry, MD, 40 mg at 12/07/20 1655 .  hydrALAZINE (APRESOLINE) injection 10 mg, 10 mg, Intravenous, Q4H PRN, Rise Patience, MD .  labetalol (NORMODYNE) injection 5 mg, 5 mg, Intravenous, Q6H PRN, Danford, Suann Larry, MD .  levETIRAcetam (KEPPRA) IVPB 1000  mg/100 mL premix, 1,000 mg, Intravenous, Q12H, Amie Portland, MD, Last Rate: 400 mL/hr at 12/08/20 0606, 1,000 mg at 12/08/20 0606 .  valproate (DEPACON) 500 mg in dextrose 5 % 50 mL IVPB, 500 mg, Intravenous, Q12H, Amie Portland, MD, Last Rate: 55 mL/hr at 12/08/20 1139, 500 mg at 12/08/20 1139 Labs CBC    Component Value Date/Time   WBC 4.3 12/08/2020 0418   RBC 4.53 12/08/2020 0418   HGB 13.7 12/08/2020 0418   HGB 12.4 01/01/2018 1519   HCT 40.7 12/08/2020 0418   HCT 40.0 01/01/2018 1519   PLT 180 12/08/2020 0418   PLT 281 01/01/2018 1519   MCV 89.8 12/08/2020 0418   MCV 90 01/01/2018 1519   MCH 30.2 12/08/2020 0418   MCHC 33.7 12/08/2020 0418   RDW 12.9 12/08/2020 0418   RDW 13.9 01/01/2018 1519   LYMPHSABS 2.0 12/08/2020 0418   MONOABS 0.7 12/08/2020 0418   EOSABS 0.0 12/08/2020 0418   BASOSABS 0.0 12/08/2020 0418    CMP     Component Value Date/Time   NA 140 12/08/2020 0418   NA 142 01/01/2018 1519   K 3.5 12/08/2020 0418   CL 102 12/08/2020 0418   CO2 27 12/08/2020 0418   GLUCOSE 92 12/08/2020 0418   BUN 8 12/08/2020 0418   BUN 19 01/01/2018 1519   CREATININE 0.91 12/08/2020 0418   CALCIUM 9.4 12/08/2020 0418   PROT 6.4 (L) 12/08/2020 0418   PROT 7.5 01/01/2018 1519   ALBUMIN 3.2 (L) 12/08/2020 9242  ALBUMIN 4.4 01/01/2018 1519   AST 38 12/08/2020 0418   ALT 18 12/08/2020 0418   ALKPHOS 75 12/08/2020 0418   BILITOT 0.7 12/08/2020 0418   BILITOT 0.2 01/01/2018 1519   GFRNONAA >60 12/08/2020 0418   GFRAA >60 10/30/2019 1656    glycosylated hemoglobin  Lipid Panel  No results found for: CHOL, TRIG, HDL, CHOLHDL, VLDL, LDLCALC, LDLDIRECT  Depakote level 12/06/2018 22-74  Imaging I have reviewed images in epic and the results pertinent to this consultation are: Initial CT head unremarkable  Assessment: 55 year old woman past medical history significant for complex partial seizure with secondary generalization, on Depakote at home, admitted with new  onset altered mental status.  Found to have tongue bite and also had bladder incontinence upon presentation.  Presumption that she had a seizure with prolonged postictal state.  Loaded with Keppra and Depakote.  She remained encephalopathic the past couple of days but today is much more awake and oriented although still has some diminished attention concentration. Continuous EEG shows left anterior temporal spikes at times with PLEDs like pattern. Of note, she also was positive for COVID 19  Impression: -Breakthrough seizure with prolonged postictal state. -COVID-19 infection could have lowered seizure threshold -Multifactorial toxic metabolic encephalopathy -Evaluate for stroke-given recent COVID-19 infection-MRI negative  Recommendations: -Keppra 1000 mg twice daily. -Continue Depakote five hundred twice daily. -Supportive treatment and COVID-19 treatment (infection likely incidental) per primary team as you are. -Also has had hypoglycemic episodes- management per primary team.  D/W Dr. Avon Gully via secure chat -- Amie Portland, MD Neurologist [facility] Pager: [phone]

## 2020-12-08 NOTE — Progress Notes (Signed)
Pt wheeled off the unit by tech with her son by her side holding all her belongings. IV removed with catheter intact.

## 2020-12-08 NOTE — Evaluation (Signed)
Physical Therapy Evaluation & Discharge Patient Details Name: Tammy [name] MRN: [id number] DOB: 1966-11-19 Today's Date: 12/08/2020   History of Present Illness  Pt is a 55 y.o. female with a medical hx significant for seizures, HTN, and asthma who presents with confusion, fever chills, and muscle aches. COVID+. EEG - for seizures but showed evidence of epileptogenicity arising from L anterior temporal region and moderate diffuse encephalopthy. MRI of brain unremarkable. CXR shows mild pneumonia.  Clinical Impression  Pt presents with condition mentioned above. PTA pt was independent with all functional mob without use of AD/AE. She has assistance available from her aunt and daughter upon return home. Pt appears to be at her baseline level of function, only displaying slight unsteadiness with difficulty narrow stance positions in standing when vision was removed but she appropriately demonstrated a step reactional strategy to regain her balance without physical assistance. Pt is A&Ox4, but does display moments of slightly impaired cognition with difficulty comprehending cues to obtain tandem stance. All education completed and questions answered. No further PT services needed at this time, will sign off.    Follow Up Recommendations No PT follow up    Equipment Recommendations  None recommended by PT    Recommendations for Other Services       Precautions / Restrictions Precautions Precautions: Fall Precaution Comments: seizures Restrictions Weight Bearing Restrictions: No      Mobility  Bed Mobility Overal bed mobility: Independent             General bed mobility comments: Pt able to perform all bed mob safely with bed flat without use of rails.    Transfers Overall transfer level: Needs assistance Equipment used: None Transfers: Sit to/from Stand Sit to Stand: Supervision         General transfer comment: Pt able to come to stand quickly without LOB, supervision  for safety.  Ambulation/Gait Ambulation/Gait assistance: Supervision Gait Distance (Feet): 90 Feet Assistive device: None Gait Pattern/deviations: WFL(Within Functional Limits) Gait velocity: WFL Gait velocity interpretation: >2.62 ft/sec, indicative of community ambulatory General Gait Details: Ambulates initially trying to avoid pulling on line with L hand in high guard position intermittenlty placing it on surfaces, like sink. Pt cued to avoid hand support and let arm rest down by side, with success. No overt LOB or significant gait deviations, supervision for safety. Able to increase speed appropriately when cued.  Stairs            Wheelchair Mobility    Modified Rankin (Stroke Patients Only) Modified Rankin (Stroke Patients Only) Pre-Morbid Rankin Score: No symptoms Modified Rankin: No symptoms     Balance Overall balance assessment: Mild deficits observed, not formally tested (LOB with appropriate step reactional strategy when eyes closed with tandem stance, min guard for safety.)                                           Pertinent Vitals/Pain Pain Assessment: No/denies pain    Home Living Family/patient expects to be discharged to:: Private residence Living Arrangements: Other relatives Engineer, petroleum) Available Help at Discharge: Family;Available 24 hours/day (daughter coming to live with them initially) Type of Home: House Home Access: Stairs to enter Entrance Stairs-Rails: None Entrance Stairs-Number of Steps: 2 Home Layout: One level Home Equipment: Shower seat Additional Comments: 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    Prior Function Level of Independence: Independent  Comments: Pt independent with all functional mob and ADLs without AD/AE. Pt reports she does drive herself sometimes but acknoweldges she is not supposed to 2/2 her seizures. Educated pt to have others drive her for her safety and others.     Hand Dominance    Dominant Hand: Right    Extremity/Trunk Assessment   Upper Extremity Assessment Upper Extremity Assessment: Defer to OT evaluation    Lower Extremity Assessment Lower Extremity Assessment: Overall WFL for tasks assessed (MMT scores of 4+ to 5 bilat, intact coordination, intact sensation to light touch, and intact dynamic proprioception in bilat legs)    Cervical / Trunk Assessment Cervical / Trunk Assessment: Normal  Communication   Communication: No difficulties  Cognition Arousal/Alertness: Awake/alert Behavior During Therapy: WFL for tasks assessed/performed Overall Cognitive Status: Impaired/Different from baseline Area of Impairment: Following commands                       Following Commands: Follows multi-step commands inconsistently       General Comments: A&Ox4.Minor difficulty undertsnading commands to obtain tandem stance, with pt changing position several times (1x with heel on top of other toe). Otherwise, pt aware of safety with appropriate reactional strategies.      General Comments General comments (skin integrity, edema, etc.): SpO2 100% on RA    Exercises     Assessment/Plan    PT Assessment Patent does not need any further PT services  PT Problem List         PT Treatment Interventions      PT Goals (Current goals can be found in the Care Plan section)  Acute Rehab PT Goals Patient Stated Goal: to go home PT Goal Formulation: With patient Time For Goal Achievement: 12/09/20 Potential to Achieve Goals: Good    Frequency     Barriers to discharge        Co-evaluation               AM-PAC PT "6 Clicks" Mobility  Outcome Measure Help needed turning from your back to your side while in a flat bed without using bedrails?: None Help needed moving from lying on your back to sitting on the side of a flat bed without using bedrails?: None Help needed moving to and from a bed to a chair (including a wheelchair)?: None Help  needed standing up from a chair using your arms (e.g., wheelchair or bedside chair)?: None Help needed to walk in hospital room?: None Help needed climbing 3-5 steps with a railing? : A Little 6 Click Score: 23    End of Session Equipment Utilized During Treatment: Gait belt Activity Tolerance: Patient tolerated treatment well Patient left: in bed;with call bell/phone within reach;with bed alarm set   PT Visit Diagnosis: Unsteadiness on feet (R26.81)    Time: 3149-7026 PT Time Calculation (min) (ACUTE ONLY): 29 min   Charges:   PT Evaluation $PT Eval Low Complexity: 1 Low PT Treatments $Therapeutic Activity: 8-22 mins        Moishe Spice, PT, DPT Acute Rehabilitation Services  Pager: [phone] Office: [place] 12/08/2020, 10:42 AM

## 2020-12-08 NOTE — TOC Transition Note (Signed)
Transition of Care [facility]) - CM/SW Discharge Note   Patient Details  Name: Tammy [name] MRN: [id number] Date of Birth: [birth year]  [facility] CM/SW Contact:  Pollie Friar, RN Phone Number: 12/08/2020, 11:53 AM   Clinical Narrative:    PCP: [facility] Pharmacy: [place]  Pt is discharging home with self care. No f/u per PT and no DME needs.  Pt uses [facility] pharmacy for her medications and they were sent there for d/c.  Pt states she has transportation home.   Final next level of care: Home/Self Care Barriers to Discharge: Inadequate or no insurance,Barriers Unresolved (comment)   Patient Goals and CMS Choice        Discharge Placement                       Discharge Plan and Services                                     Social Determinants of Health (SDOH) Interventions     Readmission Risk Interventions No flowsheet data found.

## 2020-12-08 NOTE — Discharge Summary (Signed)
Physician Discharge Summary  Tammy [name] [id number] DOB: 04-19-1966 DOA: 12/05/2020  PCP: Patient, No Pcp Per  Admit date: 12/05/2020 Discharge date: 12/08/2020  Admitted From: Home Disposition:  home  Recommendations for Outpatient Follow-up:  1. Follow up with PCP in 1-2 weeks 2. Please obtain BMP/CBC in one week 3. Please follow up on the following pending results:  Home Health: None  Equipment/Devices:None  Discharge Condition:Stable  CODE STATUS:Full  Diet recommendation: As tolerated    Brief/Interim Summary: Mrs. Toomey is a 55 y.o. F with epilepsy who presented with confusion brought by family. In the ER patient is found to have a tongue bite on exam by the neurologist CT it was unremarkable. Patient remained confused lethargic but arousable. Temperature was 99.4 otherwise no leukocytosis CK was 534 ammonia level is 21 and valproic acid was therapeutic. On-call neurologist was consulted and patient was given Keppra loading dose and started on Depakote IV. Patient admitted for encephalopathy and continuous EEG monitoring. Patient was found to be Covid test positive but not hypoxic.  Patient admitted as above with acute metabolic encephalopathy likely multifactorial in the setting of breakthrough seizure likely provoked by acute COVID-19 infection infection.  Cannot rule out concurrent COVID-19 encephalopathy, however after reinitiation of antiepileptics epileptics with assistance from neurology patient improved drastically over the past 48h.  Patient's imaging and EEG were unremarkable as well.  Patient initiated on Keppra and Depakote and will be discharged on these per neurology.  Close follow-up in the outpatient setting with neurology as scheduled.  Patient otherwise stable and agreeable for discharge home.  Discharge Diagnoses:  Principal Problem:   Seizure [facility]) Active Problems:   COVID-19 virus infection   Acute metabolic encephalopathy    Discharge  Instructions  Discharge Instructions    Call MD for:  difficulty breathing, headache or visual disturbances   Complete by: As directed    Diet - low sodium heart healthy   Complete by: As directed    Discharge instructions   Complete by: As directed    ?   Person Under Monitoring Name: Tammy [name]  Location: [address] [place] [address]   Infection Prevention Recommendations for Individuals Confirmed to have, or Being Evaluated for, 2019 Novel Coronavirus (COVID-19) Infection Who Receive Care at Home  Individuals who are confirmed to have, or are being evaluated for, COVID-19 should follow the prevention steps below until a healthcare provider or local or state health department says they can return to normal activities.  Stay home except to get medical care You should restrict activities outside your home, except for getting medical care. Do not go to work, school, or public areas, and do not use public transportation or taxis.  Call ahead before visiting your doctor Before your medical appointment, call the healthcare provider and tell them that you have, or are being evaluated for, COVID-19 infection. This will help the healthcare provider's office take steps to keep other people from getting infected. Ask your healthcare provider to call the local or state health department.  Monitor your symptoms Seek prompt medical attention if your illness is worsening (e.g., difficulty breathing). Before going to your medical appointment, call the healthcare provider and tell them that you have, or are being evaluated for, COVID-19 infection. Ask your healthcare provider to call the local or state health department.  Wear a facemask You should wear a facemask that covers your nose and mouth when you are in the same room with other people and  when you visit a healthcare provider. People who live with or visit you should also wear a facemask while they are in the same  room with you.  Separate yourself from other people in your home As much as possible, you should stay in a different room from other people in your home. Also, you should use a separate bathroom, if available.  Avoid sharing household items You should not share dishes, drinking glasses, cups, eating utensils, towels, bedding, or other items with other people in your home. After using these items, you should wash them thoroughly with soap and water.  Cover your coughs and sneezes Cover your mouth and nose with a tissue when you cough or sneeze, or you can cough or sneeze into your sleeve. Throw used tissues in a lined trash can, and immediately wash your hands with soap and water for at least 20 seconds or use an alcohol-based hand rub.  Wash your [facility] your hands often and thoroughly with soap and water for at least 20 seconds. You can use an alcohol-based hand sanitizer if soap and water are not available and if your hands are not visibly dirty. Avoid touching your eyes, nose, and mouth with unwashed hands.   Prevention Steps for Caregivers and Household Members of Individuals Confirmed to have, or Being Evaluated for, COVID-19 Infection Being Cared for in the Home  If you live with, or provide care at home for, a person confirmed to have, or being evaluated for, COVID-19 infection please follow these guidelines to prevent infection:  Follow healthcare provider's instructions Make sure that you understand and can help the patient follow any healthcare provider instructions for all care.  Provide for the patient's basic needs You should help the patient with basic needs in the home and provide support for getting groceries, prescriptions, and other personal needs.  Monitor the patient's symptoms If they are getting sicker, call his or her medical provider and tell them that the patient has, or is being evaluated for, COVID-19 infection. This will help the healthcare  provider's office take steps to keep other people from getting infected. Ask the healthcare provider to call the local or state health department.  Limit the number of people who have contact with the patient If possible, have only one caregiver for the patient. Other household members should stay in another home or place of residence. If this is not possible, they should stay in another room, or be separated from the patient as much as possible. Use a separate bathroom, if available. Restrict visitors who do not have an essential need to be in the home.  Keep older adults, very young children, and other sick people away from the patient Keep older adults, very young children, and those who have compromised immune systems or chronic health conditions away from the patient. This includes people with chronic heart, lung, or kidney conditions, diabetes, and cancer.  Ensure good ventilation Make sure that shared spaces in the home have good air flow, such as from an air conditioner or an opened window, weather permitting.  Wash your hands often Wash your hands often and thoroughly with soap and water for at least 20 seconds. You can use an alcohol based hand sanitizer if soap and water are not available and if your hands are not visibly dirty. Avoid touching your eyes, nose, and mouth with unwashed hands. Use disposable paper towels to dry your hands. If not available, use dedicated cloth towels and replace them when they become  wet.  Wear a facemask and gloves Wear a disposable facemask at all times in the room and gloves when you touch or have contact with the patient's blood, body fluids, and/or secretions or excretions, such as sweat, saliva, sputum, nasal mucus, vomit, urine, or feces.  Ensure the mask fits over your nose and mouth tightly, and do not touch it during use. Throw out disposable facemasks and gloves after using them. Do not reuse. Wash your hands immediately after removing your  facemask and gloves. If your personal clothing becomes contaminated, carefully remove clothing and launder. Wash your hands after handling contaminated clothing. Place all used disposable facemasks, gloves, and other waste in a lined container before disposing them with other household waste. Remove gloves and wash your hands immediately after handling these items.  Do not share dishes, glasses, or other household items with the patient Avoid sharing household items. You should not share dishes, drinking glasses, cups, eating utensils, towels, bedding, or other items with a patient who is confirmed to have, or being evaluated for, COVID-19 infection. After the person uses these items, you should wash them thoroughly with soap and water.  Wash laundry thoroughly Immediately remove and wash clothes or bedding that have blood, body fluids, and/or secretions or excretions, such as sweat, saliva, sputum, nasal mucus, vomit, urine, or feces, on them. Wear gloves when handling laundry from the patient. Read and follow directions on labels of laundry or clothing items and detergent. In general, wash and dry with the warmest temperatures recommended on the label.  Clean all areas the individual has used often Clean all touchable surfaces, such as counters, tabletops, doorknobs, bathroom fixtures, toilets, phones, keyboards, tablets, and bedside tables, every day. Also, clean any surfaces that may have blood, body fluids, and/or secretions or excretions on them. Wear gloves when cleaning surfaces the patient has come in contact with. Use a diluted bleach solution (e.g., dilute bleach with 1 part bleach and 10 parts water) or a household disinfectant with a label that says EPA-registered for coronaviruses. To make a bleach solution at home, add 1 tablespoon of bleach to 1 quart (4 cups) of water. For a larger supply, add  cup of bleach to 1 gallon (16 cups) of water. Read labels of cleaning products and  follow recommendations provided on product labels. Labels contain instructions for safe and effective use of the cleaning product including precautions you should take when applying the product, such as wearing gloves or eye protection and making sure you have good ventilation during use of the product. Remove gloves and wash hands immediately after cleaning.  Monitor yourself for signs and symptoms of illness Caregivers and household members are considered close contacts, should monitor their health, and will be asked to limit movement outside of the home to the extent possible. Follow the monitoring steps for close contacts listed on the symptom monitoring form.   ? If you have additional questions, contact your local health department or call the epidemiologist on call at [phone] (available 24/7). ? This guidance is subject to change. For the most up-to-date guidance from CDC, please refer to their website: YouBlogs.pl   Increase activity slowly   Complete by: As directed      Allergies as of 12/08/2020      Reactions   Dilantin [phenytoin] Swelling   Aspirin Other (See Comments)   Patient unsure of reaction    Tramadol Itching, Other (See Comments)   Pt states she ins unsure of reaction Pt states she  ins unsure of reaction Pt states she ins unsure of reaction Pt states she ins unsure of reaction      Medication List    STOP taking these medications   albuterol 108 (90 Base) MCG/ACT inhaler Commonly known as: VENTOLIN HFA   divalproex 500 MG 24 hr tablet Commonly known as: DEPAKOTE ER Replaced by: divalproex 500 MG DR tablet   lisinopril-hydrochlorothiazide 10-12.5 MG tablet Commonly known as: ZESTORETIC   mupirocin ointment 2 % Commonly known as: BACTROBAN   rivaroxaban 20 MG Tabs tablet Commonly known as: Xarelto   SUMAtriptan 50 MG tablet Commonly known as: Imitrex     TAKE these medications    amLODipine 5 MG tablet Commonly known as: NORVASC Take 1 tablet (5 mg total) by mouth daily. Start taking on: December 09, 2020   divalproex 500 MG DR tablet Commonly known as: Depakote Take 1 tablet (500 mg total) by mouth 2 (two) times daily. Replaces: divalproex 500 MG 24 hr tablet   levETIRAcetam 1000 MG tablet Commonly known as: Keppra Take 1 tablet (1,000 mg total) by mouth 2 (two) times daily.       Allergies  Allergen Reactions  . Dilantin [Phenytoin] Swelling  . Aspirin Other (See Comments)    Patient unsure of reaction   . Tramadol Itching and Other (See Comments)    Pt states she ins unsure of reaction Pt states she ins unsure of reaction Pt states she ins unsure of reaction Pt states she ins unsure of reaction    Consultations:  Neurology   Procedures/Studies: CT HEAD WO CONTRAST  Result Date: 12/06/2020 CLINICAL DATA:  Mental status change, persistent or worsening. EXAM: CT HEAD WITHOUT CONTRAST TECHNIQUE: Contiguous axial images were obtained from the base of the skull through the vertex without intravenous contrast. COMPARISON:  Yesterday FINDINGS: Brain: No evidence of acute infarction, hemorrhage, hydrocephalus, extra-axial collection or mass lesion/mass effect. Superior cerebellar atrophy which may be related to history of seizures. Vascular: No hyperdense vessel or unexpected calcification. Skull: Normal. Negative for fracture or focal lesion. Sinuses/Orbits: No acute finding. Other: Motion degraded at the level of the caudal brain. IMPRESSION: Stable motion degraded head CT.  No acute finding. Electronically Signed   By: Monte Fantasia M.D.   On: 12/06/2020 06:08   CT Head Wo Contrast  Result Date: 12/05/2020 CLINICAL DATA:  Mental status change EXAM: CT HEAD WITHOUT CONTRAST TECHNIQUE: Contiguous axial images were obtained from the base of the skull through the vertex without intravenous contrast. COMPARISON:  11/24/2018 FINDINGS: Brain: No evidence of  acute infarction, hemorrhage, hydrocephalus, extra-axial collection or mass lesion/mass effect. Vascular: No hyperdense vessel or unexpected calcification. Skull: Normal. Negative for fracture or focal lesion. Sinuses/Orbits: No acute finding. Other: None. IMPRESSION: Normal head CT without contrast for age Electronically Signed   By: Jerilynn Mages.  Shick M.D.   On: 12/05/2020 14:01   MR BRAIN W WO CONTRAST  Result Date: 12/07/2020 CLINICAL DATA:  Acute neuro deficit.  COVID positive EXAM: MRI HEAD WITHOUT AND WITH CONTRAST TECHNIQUE: Multiplanar, multiecho pulse sequences of the brain and surrounding structures were obtained without and with intravenous contrast. CONTRAST:  37mL GADAVIST GADOBUTROL 1 MMOL/ML IV SOLN COMPARISON:  CT head 12/06/2020 FINDINGS: Brain: No acute infarction, hemorrhage, hydrocephalus, extra-axial collection or mass lesion. Mild white matter changes with scattered small deep white matter hyperintensities bilaterally. Image quality degraded by motion. No enhancing lesions postcontrast. Postcontrast images degraded by significant motion. Vascular: Normal arterial flow voids. Skull and upper cervical spine: Negative  Sinuses/Orbits: Mild mucosal edema paranasal sinuses. Negative orbit Other: None IMPRESSION: No acute infarct. Mild white matter changes consistent with chronic microvascular ischemia Motion degraded examination. Electronically Signed   By: Franchot Gallo M.D.   On: 12/07/2020 14:15   DG CHEST PORT 1 VIEW  Result Date: 12/06/2020 CLINICAL DATA:  COVID-19. EXAM: PORTABLE CHEST 1 VIEW COMPARISON:  02/08/2017 FINDINGS: Limited positioning in the setting of confusion. Prominent heart size but accentuated by rotation and portable technique. Hazy density at the bases which could be infiltrate or atelectasis. No edema, visible effusion, or pneumothorax. IMPRESSION: Limited chest with hazy infiltrate or atelectasis at the bases. Electronically Signed   By: Monte Fantasia M.D.   On:  12/06/2020 05:02   DG Abd Portable 1 View  Result Date: 12/06/2020 CLINICAL DATA:  Concern for potential metallic foreign body. Altered mental status EXAM: PORTABLE ABDOMEN - 1 VIEW COMPARISON:  None. FINDINGS: No radiopaque foreign body appreciable in visualized portions of abdomen. No bowel dilatation or air-fluid level to suggest bowel obstruction. No free air. Visualized lung bases clear. IMPRESSION: No bowel obstruction or free air. No radiopaque foreign body within the visualized portions of abdomen. Portion of abdomen toward the right not visualized due to difficulty in patient positioning. Electronically Signed   By: Lowella Grip III M.D.   On: 12/06/2020 08:52   Overnight EEG with video  Result Date: 12/06/2020 Lora Havens, MD     12/07/2020  9:41 AM Patient Name: ANY MCNEICE MRN: [id number] Epilepsy Attending: Lora Havens Referring Physician/Provider: Dr Gean Birchwood Date: 12/06/2020 0600 to 12/07/2020 0600 Patient history: 55 year old female with history of epilepsy presenting with altered mental status.  EEG to eval for seizures. Level of alertness: lethargicm asleep AEDs during EEG study: Depakote, LEV Technical aspects: This EEG study was done with scalp electrodes positioned according to the 10-20 International system of electrode placement. Electrical activity was acquired at a sampling rate of 500Hz  and reviewed with a high frequency filter of 70Hz  and a low frequency filter of 1Hz . EEG data were recorded continuously and digitally stored. Description: No clear posterior dominant rhythm was seen.  Sleep was characterized by sleep spindles (12 to 14 Hz), maximal frontocentral region.  EEG showed continuous generalized 3 to 5 Hz theta-delta slowing.  Abundant left anterior temporal polyspikes were noted which at times were PLED like at 0.5Hz .   After Keppra was added the frequency of spikes improved and did not appear to be PLED like anymore.   ABNORMALITY -Polyspikes,  left anterior temporal region -Continued slow, generalized IMPRESSION: This study showed evidence of epileptogenicity arising from left anterior temporal region as well as moderate diffuse encephalopathy, nonspecific etiology but likely secondary to ictal-postictal state.  No seizures were seen throughout the recording. Priyanka Barbra Sarks     Subjective: No acute issues or events overnight   Discharge Exam: Vitals:   12/08/20 0612 12/08/20 0900  BP: 120/81 137/83  Pulse: 62 66  Resp: 20 20  Temp: 98 F (36.7 C) 98.3 F (36.8 C)  SpO2: 97%    Vitals:   12/07/20 2031 12/08/20 0058 12/08/20 0612 12/08/20 0900  BP: 130/89 124/76 120/81 137/83  Pulse: 61 63 62 66  Resp: 20 20 20 20   Temp: 98.8 F (37.1 C) 98.3 F (36.8 C) 98 F (36.7 C) 98.3 F (36.8 C)  TempSrc: Oral Oral Oral Oral  SpO2: 98% 97% 97%   Weight:      Height:  General: Pt is alert, awake, not in acute distress Cardiovascular: RRR, S1/S2 +, no rubs, no gallops Respiratory: CTA bilaterally, no wheezing, no rhonchi Abdominal: Soft, NT, ND, bowel sounds + Extremities: no edema, no cyanosis    The results of significant diagnostics from this hospitalization (including imaging, microbiology, ancillary and laboratory) are listed below for reference.     Microbiology: Recent Results (from the past 240 hour(s))  SARS CORONAVIRUS 2 (TAT 6-24 HRS) Nasopharyngeal Nasopharyngeal Swab     Status: Abnormal   Collection Time: 12/05/20  2:41 PM   Specimen: Nasopharyngeal Swab  Result Value Ref Range Status   SARS Coronavirus 2 POSITIVE (A) NEGATIVE Final    Comment: (NOTE) SARS-CoV-2 target nucleic acids are DETECTED.  The SARS-CoV-2 RNA is generally detectable in upper and lower respiratory specimens during the acute phase of infection. Positive results are indicative of the presence of SARS-CoV-2 RNA. Clinical correlation with patient history and other diagnostic information is  necessary to determine patient  infection status. Positive results do not rule out bacterial infection or co-infection with other viruses.  The expected result is Negative.  Fact Sheet for Patients: SugarRoll.be  Fact Sheet for Healthcare Providers: https://www.woods-mathews.com/  This test is not yet approved or cleared by the [place] FDA and  has been authorized for detection and/or diagnosis of SARS-CoV-2 by FDA under an Emergency Use Authorization (EUA). This EUA will remain  in effect (meaning this test can be used) for the duration of the COVID-19 declaration under Section 564(b)(1) of the Act, 21 U. S.C. section 360bbb-3(b)(1), unless the authorization is terminated or revoked sooner.   Performed at [facility], [place] [address] [place], [place] [address]      Labs: BNP (last 3 results) No results for input(s): BNP in the last 8760 hours. Basic Metabolic Panel: Recent Labs  Lab 12/05/20 1441 12/05/20 1508 12/06/20 0122 12/06/20 0514 12/07/20 0524 12/08/20 0418  NA 137 139  --  139 137 140  K 3.5 3.4*  --  3.4* 3.3* 3.5  CL 99  --   --  101 100 102  CO2 24  --   --  25 26 27   GLUCOSE 93  --   --  83 90 92  BUN 13  --   --  12 7 8   CREATININE 1.01*  --  0.94 0.99 0.86 0.91  CALCIUM 9.5  --   --  9.8 9.5 9.4  MG  --   --   --   --   --  1.9   Liver Function Tests: Recent Labs  Lab 12/05/20 1441 12/06/20 0514 12/07/20 0524 12/08/20 0418  AST 33 50* 61* 38  ALT 11 15 19 18   ALKPHOS 39 39 36* 75  BILITOT 0.4 0.6 0.3 0.7  PROT 7.4 7.5 6.4* 6.4*  ALBUMIN 3.9 4.0 3.3* 3.2*   No results for input(s): LIPASE, AMYLASE in the last 168 hours. Recent Labs  Lab 12/05/20 1441  AMMONIA 21   CBC: Recent Labs  Lab 12/05/20 1441 12/05/20 1508 12/06/20 0122 12/06/20 0514 12/07/20 0524 12/08/20 0418  WBC 8.4  --  7.7 6.7 6.1 4.3  NEUTROABS 7.4  --   --  4.8 3.8 1.6*  HGB 13.8 15.3* 13.2 13.5 14.1 13.7  HCT 42.6 45.0 41.2 41.2 42.1 40.7   MCV 92.6  --  91.4 91.2 90.1 89.8  PLT 184  --  201 204 175 180   Cardiac Enzymes: Recent Labs  Lab 12/05/20  1441  CKTOTAL 534*   BNP: Invalid input(s): POCBNP CBG: Recent Labs  Lab 12/07/20 2021 12/08/20 0000 12/08/20 0339 12/08/20 0910 12/08/20 1338  GLUCAP 95 113* 91 84 86   D-Dimer Recent Labs    12/07/20 0524 12/08/20 0418  DDIMER 0.38 0.38   Hgb A1c No results for input(s): HGBA1C in the last 72 hours. Lipid Profile No results for input(s): CHOL, HDL, LDLCALC, TRIG, CHOLHDL, LDLDIRECT in the last 72 hours. Thyroid function studies Recent Labs    12/05/20 2006  TSH 0.541   Anemia work up No results for input(s): VITAMINB12, FOLATE, FERRITIN, TIBC, IRON, RETICCTPCT in the last 72 hours. Urinalysis    Component Value Date/Time   COLORURINE YELLOW 12/05/2020 2154   APPEARANCEUR CLEAR 12/05/2020 2154   LABSPEC 1.008 12/05/2020 2154   PHURINE 6.0 12/05/2020 2154   GLUCOSEU NEGATIVE 12/05/2020 2154   HGBUR NEGATIVE 12/05/2020 2154   BILIRUBINUR NEGATIVE 12/05/2020 2154   KETONESUR 5 (A) 12/05/2020 2154   PROTEINUR NEGATIVE 12/05/2020 2154   UROBILINOGEN 1.0 02/05/2014 1852   NITRITE NEGATIVE 12/05/2020 2154   LEUKOCYTESUR SMALL (A) 12/05/2020 2154   Sepsis Labs Invalid input(s): PROCALCITONIN,  WBC,  LACTICIDVEN Microbiology Recent Results (from the past 240 hour(s))  SARS CORONAVIRUS 2 (TAT 6-24 HRS) Nasopharyngeal Nasopharyngeal Swab     Status: Abnormal   Collection Time: 12/05/20  2:41 PM   Specimen: Nasopharyngeal Swab  Result Value Ref Range Status   SARS Coronavirus 2 POSITIVE (A) NEGATIVE Final    Comment: (NOTE) SARS-CoV-2 target nucleic acids are DETECTED.  The SARS-CoV-2 RNA is generally detectable in upper and lower respiratory specimens during the acute phase of infection. Positive results are indicative of the presence of SARS-CoV-2 RNA. Clinical correlation with patient history and other diagnostic information is  necessary to  determine patient infection status. Positive results do not rule out bacterial infection or co-infection with other viruses.  The expected result is Negative.  Fact Sheet for Patients: SugarRoll.be  Fact Sheet for Healthcare Providers: https://www.woods-mathews.com/  This test is not yet approved or cleared by the [place] FDA and  has been authorized for detection and/or diagnosis of SARS-CoV-2 by FDA under an Emergency Use Authorization (EUA). This EUA will remain  in effect (meaning this test can be used) for the duration of the COVID-19 declaration under Section 564(b)(1) of the Act, 21 U. S.C. section 360bbb-3(b)(1), unless the authorization is terminated or revoked sooner.   Performed at [facility], [place] [address], [place], [place] [address]      Time coordinating discharge: Over 30 minutes  SIGNED:   Little Ishikawa, DO [facility] 12/08/2020, 6:06 PM Pager   If 7PM-7AM, please contact night-coverage [email or web address]

## 2020-12-10 ENCOUNTER — Telehealth (INDEPENDENT_AMBULATORY_CARE_PROVIDER_SITE_OTHER): Payer: Self-pay

## 2020-12-10 NOTE — Telephone Encounter (Signed)
Called and left message asking patient to return call to [phone] to discuss new dosage of medication. Nat Christen, CMA    Copied from [place] [phone]. Topic: General - Other >> Dec 09, 2020 12:07 PM Keene Breath wrote: Reason for CRM: Patient called to speak with the nurse regarding her medication.  She said that her medication was changed.  She would like to know what her dosage is.  Please call patient at [phone]

## 2020-12-22 ENCOUNTER — Encounter: Payer: Self-pay | Admitting: Physician Assistant

## 2020-12-22 ENCOUNTER — Ambulatory Visit: Payer: Self-pay | Attending: Physician Assistant | Admitting: Physician Assistant

## 2020-12-22 ENCOUNTER — Other Ambulatory Visit: Payer: Self-pay

## 2020-12-22 VITALS — BP 130/79 | HR 75 | Temp 98.6°F | Resp 16 | Ht 62.5 in | Wt 151.4 lb

## 2020-12-22 DIAGNOSIS — G9341 Metabolic encephalopathy: Secondary | ICD-10-CM

## 2020-12-22 DIAGNOSIS — U071 COVID-19: Secondary | ICD-10-CM

## 2020-12-22 DIAGNOSIS — R569 Unspecified convulsions: Secondary | ICD-10-CM

## 2020-12-22 DIAGNOSIS — R7982 Elevated C-reactive protein (CRP): Secondary | ICD-10-CM

## 2020-12-22 DIAGNOSIS — I10 Essential (primary) hypertension: Secondary | ICD-10-CM

## 2020-12-22 NOTE — Progress Notes (Unsigned)
Patient ID: Tammy [name], female   DOB: Sep 20, 1966, 55 y.o.   MRN: [id number]     Tammy [name], is a 55 y.o. female  WEX:[id number]  LFY:[id number]  DOB - [birth year]  Subjective:  Chief Complaint and HPI: Tammy [name] is a 55 y.o. female here today to establish care and for a follow up visit After hospitalization 1/9-1/10/2021.  She is improving.  Her daughter is here with her today.  The patient is able to accomplish most ADL with minimal help.  Her memory is improving.  She needs RF of her meds.  Appetite improving.  No SOB.  She is asking me about applying for disability for 12 months or longer.    Recommendations for Outpatient Follow-up:  1. Follow up with PCP in 1-2 weeks 2. Please obtain BMP/CBC in one week 3. Please follow up on the following pending results:   Brief/Interim Summary: Tammy [name] is a55 y.o.F with epilepsy who presented with confusion brought by family. In the ER patient is found to have a tongue bite on exam by the neurologist CT it was unremarkable. Patient remained confused lethargic but arousable. Temperature was 99.4 otherwise no leukocytosis CK was 534 ammonia level is 21 and valproic acid was therapeutic. On-call neurologist was consulted and patient was given Keppra loading dose and started on Depakote IV. Patient admitted for encephalopathy and continuous EEG monitoring. Patient was found to be Covid test positive but not hypoxic.  Patient admitted as above with acute metabolic encephalopathy likely multifactorial in the setting of breakthrough seizure likely provoked by acute COVID-19 infection infection.  Cannot rule out concurrent COVID-19 encephalopathy, however after reinitiation of antiepileptics epileptics with assistance from neurology patient improved drastically over the past 48h.  Patient's imaging and EEG were unremarkable as well.  Patient initiated on Keppra and Depakote and will be discharged on these per neurology.  Close  follow-up in the outpatient setting with neurology as scheduled.  Patient otherwise stable and agreeable for discharge home.  Discharge Diagnoses:  Principal Problem:   Seizure [facility]) Active Problems:   COVID-19 virus infection   Acute metabolic encephalopathy   ED/Hospital notes reviewed.    ROS:   Constitutional:  No f/c, No night sweats, No unexplained weight loss. EENT:  No vision changes, No blurry vision, No hearing changes. No mouth, throat, or ear problems.  Respiratory: No cough, No SOB Cardiac: No CP, no palpitations GI:  No abd pain, No N/V/D. GU: No Urinary s/sx Musculoskeletal: No joint pain Neuro: some headache, no dizziness, no focal motor weakness.  Skin: No rash Endocrine:  No polydipsia. No polyuria.  Psych: Denies SI/HI  No problems updated.  ALLERGIES: Allergies  Allergen Reactions  . Dilantin [Phenytoin] Swelling  . Aspirin Other (See Comments)    Patient unsure of reaction   . Tramadol Itching and Other (See Comments)    Pt states she ins unsure of reaction Pt states she ins unsure of reaction Pt states she ins unsure of reaction Pt states she ins unsure of reaction    PAST MEDICAL HISTORY: Past Medical History:  Diagnosis Date  . Asthma   . Headache(784.0)   . Hypertension   . Seizures ([place])     MEDICATIONS AT HOME: Prior to Admission medications   Medication Sig Start Date End Date Taking? Authorizing Provider  amLODipine (NORVASC) 5 MG tablet Take 1 tablet (5 mg total) by mouth daily. 12/09/20   Little Ishikawa, MD  divalproex (DEPAKOTE) 500 MG DR tablet Take 1  tablet (500 mg total) by mouth 2 (two) times daily. 12/08/20 12/08/21  Little Ishikawa, MD  levETIRAcetam (KEPPRA) 1000 MG tablet Take 1 tablet (1,000 mg total) by mouth 2 (two) times daily. 12/08/20   Little Ishikawa, MD     Objective:  EXAM:   Vitals:   12/22/20 1606  BP: 130/79  Pulse: 75  Resp: 16  Temp: 98.6 F (37 C)  SpO2: 95%  Weight: 151 lb 6.4 oz  (68.7 kg)  Height: 5' 2.5" (1.588 m)    General appearance : A&OX3. NAD. Non-toxic-appearing HEENT: Atraumatic and Normocephalic.  PERRLA. EOM intact.   Chest/Lungs:  Breathing-non-labored, Good air entry bilaterally, breath sounds normal without rales, rhonchi, or wheezing  CVS: S1 S2 regular, no murmurs, gallops, rubs  Abdomen: Bowel sounds present, Non tender and not distended with no gaurding, rigidity or rebound. Extremities: Bilateral Lower Ext shows no edema, both legs are warm to touch with = pulse throughout Neurology:  CN II-XII grossly intact, Non focal.   Psych:  TP linear. J/I fair. Normal speech. Appropriate eye contact and blunted affect.  Skin:  No Rash  Data Review No results found for: HGBA1C   Assessment & Plan   1. Seizures ([place]) I do not see anything that would make me think she will have long term issues that will require disability if her labs are improving and will defer to neurology to decide if their findings suggest otherwise.   - Ambulatory referral to Neurology  2. Essential hypertension Controlled -continue current dose amlodipine - Comprehensive metabolic panel - CBC with Differential/Platelet  3. COVID-19 virus infection resolved and improving - Comprehensive metabolic panel - CBC with Differential/Platelet  4. Acute metabolic encephalopathy Clinically improving - Comprehensive metabolic panel  5. Elevated C-reactive protein (CRP) Back to normal - C-reactive protein     Patient have been counseled extensively about nutrition and exercise  Return in about 1 week (around 12/29/2020) for 10 days to 2 weeks to be assigne a PCP.  The patient was given clear instructions to go to ER or return to medical center if symptoms don't improve, worsen or new problems develop. The patient verbalized understanding. The patient was told to call to get lab results if they haven't Tammy anything in the next week.     Freeman Caldron, PA-C [facility] [place], [place]   12/22/2020, 4:46 PM

## 2020-12-23 ENCOUNTER — Encounter: Payer: Self-pay | Admitting: Neurology

## 2020-12-23 ENCOUNTER — Encounter: Payer: Self-pay | Admitting: Physician Assistant

## 2020-12-23 LAB — COMPREHENSIVE METABOLIC PANEL
ALT: 6 IU/L (ref 0–32)
AST: 14 IU/L (ref 0–40)
Albumin/Globulin Ratio: 1.4 (ref 1.2–2.2)
Albumin: 4.3 g/dL (ref 3.8–4.9)
Alkaline Phosphatase: 53 IU/L (ref 44–121)
BUN/Creatinine Ratio: 24 — ABNORMAL HIGH (ref 9–23)
BUN: 18 mg/dL (ref 6–24)
Bilirubin Total: <0.2 mg/dL (ref 0.0–1.2)
CO2: 30 mmol/L — ABNORMAL HIGH (ref 20–29)
Calcium: 9.8 mg/dL (ref 8.7–10.2)
Chloride: 100 mmol/L (ref 96–106)
Creatinine, Ser: 0.74 mg/dL (ref 0.57–1.00)
GFR calc Af Amer: 108 mL/min/{1.73_m2} (ref >59)
GFR calc non Af Amer: 93 mL/min/{1.73_m2} (ref >59)
Globulin, Total: 3.1 g/dL (ref 1.5–4.5)
Glucose: 87 mg/dL (ref 65–99)
Potassium: 4.9 mmol/L (ref 3.5–5.2)
Sodium: 142 mmol/L (ref 134–144)
Total Protein: 7.4 g/dL (ref 6.0–8.5)

## 2020-12-23 LAB — CBC WITH DIFFERENTIAL/PLATELET
Basophils Absolute: 0 10*3/uL (ref 0.0–0.2)
Basos: 1 %
EOS (ABSOLUTE): 0.1 10*3/uL (ref 0.0–0.4)
Eos: 1 %
Hematocrit: 41.4 % (ref 34.0–46.6)
Hemoglobin: 13.6 g/dL (ref 11.1–15.9)
Immature Grans (Abs): 0 10*3/uL (ref 0.0–0.1)
Immature Granulocytes: 0 %
Lymphocytes Absolute: 2.5 10*3/uL (ref 0.7–3.1)
Lymphs: 34 %
MCH: 29 pg (ref 26.6–33.0)
MCHC: 32.9 g/dL (ref 31.5–35.7)
MCV: 88 fL (ref 79–97)
Monocytes Absolute: 0.7 10*3/uL (ref 0.1–0.9)
Monocytes: 9 %
Neutrophils Absolute: 4.1 10*3/uL (ref 1.4–7.0)
Neutrophils: 55 %
Platelets: 245 10*3/uL (ref 150–450)
RBC: 4.69 x10E6/uL (ref 3.77–5.28)
RDW: 12.2 % (ref 11.7–15.4)
WBC: 7.4 10*3/uL (ref 3.4–10.8)

## 2020-12-23 LAB — C-REACTIVE PROTEIN: CRP: 4 mg/L (ref 0–10)

## 2020-12-29 ENCOUNTER — Other Ambulatory Visit: Payer: Self-pay

## 2020-12-29 ENCOUNTER — Telehealth (INDEPENDENT_AMBULATORY_CARE_PROVIDER_SITE_OTHER): Payer: Self-pay | Admitting: Family

## 2020-12-29 DIAGNOSIS — Z7689 Persons encountering health services in other specified circumstances: Secondary | ICD-10-CM

## 2020-12-29 DIAGNOSIS — R600 Localized edema: Secondary | ICD-10-CM

## 2020-12-29 DIAGNOSIS — M62838 Other muscle spasm: Secondary | ICD-10-CM

## 2020-12-29 NOTE — Progress Notes (Signed)
Virtual Visit via Telephone Note  I connected with Tammy [name], on 12/29/2020 at 2:31 PM by telephone due to the COVID-19 pandemic and verified that I am speaking with the correct person using two identifiers.  Due to current restrictions/limitations of in-office visits due to the COVID-19 pandemic, this scheduled clinical appointment was converted to a telehealth visit.   Consent: I discussed the limitations, risks, security and privacy concerns of performing an evaluation and management service by telephone and the availability of in person appointments. I also discussed with the patient that there may be a patient responsible charge related to this service. The patient expressed understanding and agreed to proceed.  Location of Patient: Home  Location of Provider: [facility] [place] participating in Telemedicine visit: [place], NP Elmon Else, [place]  History of Present Illness: Tammy [name] is a 55 year-old female who presents to establish care. History of embolism/thrombosis, chronic migraine, acute superficial venous thrombosis of lower extremity, essential hypertension, asthma, acute metabolic encephalopathy, fibroids uterus, seizures, and Covid-19 virus infection.    Current issues and/or concerns:  1. RIGHT LEG PAIN AND LEFT LEG SWELLING:  12/22/2020: Visit with physician assistant Freeman Caldron. Referred to Neurology for evaluation of seizures.    12/29/2020: Reports since last visit right leg has frequent muscle spasms from calf muscle down. Described as a shooting pain. States she believes this is related to side effects of seizure.   Also, concern for left leg swelling both with standing and sitting, warm, ongoing for 2 years. Requesting to be evaluated in-person at her primary physician's office.   Past Medical History:  Diagnosis Date  . Asthma   . Headache(784.0)   . Hypertension   . Seizures ([facility])     Allergies  Allergen Reactions  . Dilantin [Phenytoin] Swelling  . Aspirin Other (See Comments)    Patient unsure of reaction   . Tramadol Itching and Other (See Comments)    Pt states she ins unsure of reaction Pt states she ins unsure of reaction Pt states she ins unsure of reaction Pt states she ins unsure of reaction    Current Outpatient Medications on File Prior to Visit  Medication Sig Dispense Refill  . acetaminophen (TYLENOL) 500 MG tablet Take 500 mg by mouth every 6 (six) hours as needed.    Marland Kitchen amLODipine (NORVASC) 5 MG tablet Take 1 tablet (5 mg total) by mouth daily. 30 tablet 1  . divalproex (DEPAKOTE) 500 MG DR tablet Take 1 tablet (500 mg total) by mouth 2 (two) times daily. 60 tablet 2  . levETIRAcetam (KEPPRA) 1000 MG tablet Take 1 tablet (1,000 mg total) by mouth 2 (two) times daily. 60 tablet 02   No current facility-administered medications on file prior to visit.    Observations/Objective: Alert and oriented x 3. Not in acute distress. Physical examination not completed as this is a telemedicine visit.  Assessment and Plan: 1. Muscle spasm of right leg: - Reports since last visit right leg has frequent muscle spasms from calf muscle down. Described as a shooting pain. States she believes this is related to side effects of seizure on 12/05/2020. - Will hold prescribing muscle relaxants at this time pending Neurology appointment.  - Keep appointment with Neurology.  Patient was given clear instructions to go to Emergency Department or return to medical center if symptoms don't improve, worsen, or new problems develop.The patient verbalized understanding.   2. Leg edema, left: -  Patient with concern for left leg swelling both with standing and sitting, warm, ongoing for 2 years. History of DVT. Requesting to be evaluated in-person at her primary physician's office.  - Patient was given clear instructions to go to Emergency Department or return to medical center if  symptoms don't improve, worsen, or new problems develop.The patient verbalized understanding.  Follow Up Instructions: Follow-up with primary physician as scheduled. Keep appointments with Neurology.   Patient was given clear instructions to go to Emergency Department or return to medical center if symptoms don't improve, worsen, or new problems develop.The patient verbalized understanding.  I discussed the assessment and treatment plan with the patient. The patient was provided an opportunity to ask questions and all were answered. The patient agreed with the plan and demonstrated an understanding of the instructions.   The patient was advised to call back or seek an in-person evaluation if the symptoms worsen or if the condition fails to improve as anticipated.   I provided 15 minutes total of non-face-to-face time during this encounter including median intraservice time, reviewing previous notes, labs, imaging, medications, management and patient verbalized understanding.    Camillia Herter, NP  [facility] at [facility] [place], [place] 12/29/2020, 2:31 PM

## 2020-12-29 NOTE — Progress Notes (Signed)
Still off balance,leg still hurting  Next appt w/Neurology 3/2 Needs work note

## 2021-01-04 MED FILL — levETIRAcetam 1000 MG TABS: 1000 | 30 days supply | Qty: 60 | Fill #1

## 2021-01-04 MED FILL — DIVALPROEX SOD DR 500 MG TA: 500 | 30 days supply | Qty: 60 | Fill #1

## 2021-01-04 MED FILL — AMLODIPINE BESYLATE 5 MG TA: 5 | 30 days supply | Qty: 30 | Fill #1

## 2021-02-07 ENCOUNTER — Other Ambulatory Visit: Payer: Self-pay | Admitting: Family

## 2021-02-09 ENCOUNTER — Other Ambulatory Visit: Payer: Self-pay | Admitting: Pharmacy Technician

## 2021-02-09 ENCOUNTER — Other Ambulatory Visit: Payer: Self-pay | Admitting: Physician Assistant

## 2021-02-09 MED ORDER — AMLODIPINE BESYLATE 5 MG PO TABS: 5.0000 mg | ORAL_TABLET | Freq: Every day | ORAL | 2 refills | Status: DC

## 2021-02-22 ENCOUNTER — Ambulatory Visit (INDEPENDENT_AMBULATORY_CARE_PROVIDER_SITE_OTHER): Payer: Self-pay | Admitting: Neurology

## 2021-02-22 ENCOUNTER — Other Ambulatory Visit: Payer: Self-pay | Admitting: Neurology

## 2021-02-22 ENCOUNTER — Other Ambulatory Visit: Payer: Self-pay

## 2021-02-22 ENCOUNTER — Encounter: Payer: Self-pay | Admitting: Neurology

## 2021-02-22 VITALS — BP 127/91 | HR 74 | Ht 63.5 in | Wt 156.4 lb

## 2021-02-22 DIAGNOSIS — G40009 Localization-related (focal) (partial) idiopathic epilepsy and epileptic syndromes with seizures of localized onset, not intractable, without status epilepticus: Secondary | ICD-10-CM

## 2021-02-22 DIAGNOSIS — G43709 Chronic migraine without aura, not intractable, without status migrainosus: Secondary | ICD-10-CM

## 2021-02-22 MED ORDER — DIVALPROEX SODIUM 500 MG PO DR TAB
500.0000 mg | DELAYED_RELEASE_TABLET | Freq: Two times a day (BID) | ORAL | 11 refills | Status: DC
Start: 2021-02-22 — End: 2021-02-22

## 2021-02-22 MED ORDER — TOPIRAMATE 50 MG PO TABS: ORAL_TABLET | ORAL | 11 refills | Status: DC

## 2021-02-22 MED ORDER — LEVETIRACETAM 1000 MG PO TABS: 1000.0000 mg | ORAL_TABLET | Freq: Two times a day (BID) | ORAL | 1 refills | Status: DC

## 2021-02-22 NOTE — Patient Instructions (Signed)
1. Start Topiramate 50mg : Take 1/2 tablet daily for 1 week,  then increase to 1 tablet twice a day for 1 week,  then increase to 2 tablets twice a day and continue  2. Continue Keppra (Levetiracetam for another 3 weeks). Once on the full dose of the Topiramate, start reducing Keppra to  1/2 tab in AM, 1 tab in PM for 1 week,  then 1/2 tablet twice a day for 1 week,  then 1/2 tablet every night for 1 week, then stop  3. Continue Depakote 500mg  twice a day  4. Follow-up with PCP on left hip pain, goiter  5. Follow-up in 3 months, call for any changes   Seizure Precautions: 1. If medication has been prescribed for you to prevent seizures, take it exactly as directed.  Do not stop taking the medicine without talking to your doctor first, even if you have not had a seizure in a long time.   2. Avoid activities in which a seizure would cause danger to yourself or to others.  Don't operate dangerous machinery, swim alone, or climb in high or dangerous places, such as on ladders, roofs, or girders.  Do not drive unless your doctor says you may.  3. If you have any warning that you may have a seizure, lay down in a safe place where you can't hurt yourself.    4.  No driving for 6 months from last seizure, as per [facility].   Please refer to the following link on the [employer] website for more information: http://www.epilepsyfoundation.org/answerplace/Social/driving/drivingu.cfm   5.  Maintain good sleep hygiene. Avoid alcohol.  6.  Contact your doctor if you have any problems that may be related to the medicine you are taking.  7.  Call 911 and bring the patient back to the ED if:        A.  The seizure lasts longer than 5 minutes.       B.  The patient doesn't awaken shortly after the seizure  C.  The patient has new problems such as difficulty seeing, speaking or moving  D.  The patient was injured during the seizure  E.  The patient has a temperature  over 102 F (39C)  F.  The patient vomited and now is having trouble breathing

## 2021-02-22 NOTE — Progress Notes (Signed)
NEUROLOGY CONSULTATION NOTE  Tammy [name] MRN: [id number] DOB: 01-13-1966  Referring provider: Freeman Caldron, PA-C Primary care provider: Dr. Karle Plumber  Reason for consult:  seizure  Thank you for your kind referral of Tammy [name] for consultation of the above symptoms. Although her history is well known to you, please allow me to reiterate it for the purpose of our medical record. Records and images were personally reviewed where available.   HISTORY OF PRESENT ILLNESS: This is a 55 year old right-handed woman with a history of hypertension, migraines, complex partial seizures with secondary generalization, presenting to establish care. Seizures started in childhood, she was having frequent passing out episodes. She had a nocturnal seizure in 2010 and was started on Dilantin, lost to follow-up until 2017 when she saw neurologist Dr. Krista Blue and reported 2-3 seizures every month preceded by lights flashing, bell ringing, then loss of consciousness. Dilantin apparently caused throat swelling, she was taking Keppra. She was started on Depakote in 2017 for seizure and migraine prophylaxis. She was admitted to [facility] in January 2022 after she was found unresponsive, with tongue bite and incontinence. She remained encephalopathic in the ER, then had gaze deviation, not following commands. She was found to be COVID positive. She was given IV Ativan and Keppra, EEG after showed diffuse slowing with left anterior temporal polyspikes, at times with PLEDs-like appearance. She started returning to baseline around 48 hours later. 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 She was discharged home on Depakote 500mg  BID (which she was taking prior to admission, level 68 on admission) and Levetiracetam 1000mg  BID.  Since hospital discharge, she has been living with her aunt. Prior to this, she was living with a different aunt. Family tries to be  with her 24/7, rotating with each other. She reports majority of her seizures are nocturnal, she has 2-3 nocturnal seizures a month. She also reports episodes of confusion, she brings a piece of paper where she wrote down gibberish words when she was asked to write her name and SSN. She has occasional olfactory hallucinations where she smells a stench. When she got of of the hospital last January, she noticed right-sided weakness. She was fine yesterday, but this morning woke up with the left side of her body hurting. She reports sharp pain radiating up to her hip. No falls since January. She denies any numbness/tingling, she has occasional urinary incontinence. She has occasional uncontrollable hand tremors, L>R. Migraines started at age 45, pain usually starts on the left side of her head and radiates diffusely. She would be sensitive to lights and sounds with occasional nausea. She has migraines 2-3 times a week. She used to take sumatriptan but felt it increased her appetite. She usually takes Tylenol which does not help much. She brings a note she wrote in 2012 about her need to see a doctor about her feelings, not just to push pills but to listen to her. She notes depression and is tearful several times in the office today. She reports the medications make her tired, make her sad. She makes herself get up, otherwise she cannot do anything. Since January, she can't even cook for herself anymore. She works for [employer] in [place] for a week every few months. She does not drive. She reports a significant amount of weight loss and points to her goiter. She denies any dizziness, she has occasional neck pain and double vision. She reports left  calf swelling and left hip pain.  Epilepsy Risk Factors:  Her grandson has seizures. 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 She had a bad head injury in 1988 in the [place] where her head was hit by the bunk bed. There is no history of febrile  convulsions, CNS infections such as meningitis/encephalitis, significant traumatic brain injury, neurosurgical procedures.  Prior AEDs: Dilantin Laboratory Data:  EEGs: 11/2020 prolonged EEG showed polyspikes in the left anterior temporal region MRI: MRI brain with and without contrast in 11/2020 no acute changes, there was mild chronic microvascular disease, hippocampi symmetric with no abnormal signal or enhancement  Lab Results  Component Value Date   TSH 0.541 12/05/2020    PAST MEDICAL HISTORY: Past Medical History:  Diagnosis Date  . Asthma   . Headache(784.0)   . Hypertension   . Seizures ([place])     PAST SURGICAL HISTORY: Past Surgical History:  Procedure Laterality Date  . HERNIA REPAIR    . UTERINE FIBROID SURGERY      MEDICATIONS: Current Outpatient Medications on File Prior to Visit  Medication Sig Dispense Refill  . acetaminophen (TYLENOL) 500 MG tablet Take 500 mg by mouth every 6 (six) hours as needed.    Marland Kitchen amLODipine (NORVASC) 5 MG tablet Take 1 tablet (5 mg total) by mouth daily. 30 tablet 2  . divalproex (DEPAKOTE) 500 MG DR tablet Take 1 tablet (500 mg total) by mouth 2 (two) times daily. 60 tablet 2  . levETIRAcetam (KEPPRA) 1000 MG tablet Take 1 tablet (1,000 mg total) by mouth 2 (two) times daily. 60 tablet 02   No current facility-administered medications on file prior to visit.    ALLERGIES: Allergies  Allergen Reactions  . Dilantin [Phenytoin] Swelling  . Aspirin Other (See Comments)    Patient unsure of reaction   . Tramadol Itching and Other (See Comments)    Pt states she ins unsure of reaction Pt states she ins unsure of reaction Pt states she ins unsure of reaction Pt states she ins unsure of reaction    FAMILY HISTORY: Family History  Problem Relation Age of Onset  . Heart disease Mother   . Migraines Mother   . Heart attack Father     SOCIAL HISTORY: Social History   Socioeconomic History  . Marital status: Single    Spouse  name: Not on file  . Number of children: Not on file  . Years of education: Not on file  . Highest education level: Not on file  Occupational History  . Not on file  Tobacco Use  . Smoking status: Never Smoker  . Smokeless tobacco: Never Used  Substance and Sexual Activity  . Alcohol use: No  . Drug use: Yes    Types: Marijuana  . Sexual activity: Not on file  Other Topics Concern  . Not on file  Social History Narrative  . Not on file   Social Determinants of Health   Financial Resource Strain: Not on file  Food Insecurity: Not on file  Transportation Needs: Not on file  Physical Activity: Not on file  Stress: Not on file  Social Connections: Not on file  Intimate Partner Violence: Not on file     PHYSICAL EXAM: Vitals:   02/22/21 1302  BP: (!) 127/91  Pulse: 74  SpO2: 99%   General: No acute distress, tearful Head:  Normocephalic/atraumatic Skin/Extremities: No rash, +warmth and tenderness over the left knee Neurological Exam: Mental status: alert and awake,  no dysarthria or aphasia, Fund of knowledge is appropriate.  Recent and remote memory are intact.  Attention and concentration are normal.   Cranial nerves: CN I: not tested CN II: pupils equal, round and reactive to light, visual fields intact CN III, IV, VI:  full range of motion, no nystagmus, no ptosis CN V: facial sensation intact CN VII: upper and lower face symmetric CN VIII: hearing intact to conversation Bulk & Tone: normal, no fasciculations. Motor: 5/5 throughout with no pronator drift. Sensation: intact to light touch,  pin, vibration sense. Decreased cold on left LE. Romberg test negative Deep Tendon Reflexes: +2 throughout Cerebellar: no incoordination on finger to nose testing Gait: slow and cautious favoring left leg due to left hip pain Tremor: no resting tremor or action tremor. She has bilateral high frequency low amplitude postural tremor L>R   IMPRESSION: This is a 55 year old  right-handed woman with a history of hypertension, migraines, left temporal lobe epilepsy, presenting to establish care. MRI brain unremarkable, EEG showed left anterior temporal polyspikes. She denies any convulsions since January 2022, however continues to note episodes of confusion. She is also tearful and appears to have side effects on Levetiracetam. We discussed switching to Topiramate for seizure and migraine prophylaxis, side effects discussed. Start Topiramate 25mg  BID x 1 week, then increase to 50mg  BID x 1 week, then 100mg  BID. Continue Depakote 500mg  BID. Once she is on Topiramate 100mg  BID, she will start weaning off Levetiracetam. Follow-up with PCP for goiter and left hip pain. [place] driving laws were discussed with the patient, and she knows to stop driving after a seizure, until 6 months seizure-free. She does not drive. Follow-up in 3 months, call for any changes.    Thank you for allowing me to participate in the care of this patient. Please do not hesitate to call for any questions or concerns.   Ellouise Newer, M.D.  CC: Dr. Wynetta Emery, Freeman Caldron, PA-C

## 2021-02-23 ENCOUNTER — Telehealth: Payer: Self-pay | Admitting: Neurology

## 2021-02-23 NOTE — Telephone Encounter (Signed)
Patient needs a letter for her housing she spoke with Dr Delice Lesch about this

## 2021-02-24 NOTE — Telephone Encounter (Signed)
7-10 days turn around for letter requests. She can also ask her PCP for this. Thanks

## 2021-02-24 NOTE — Telephone Encounter (Signed)
Called patient and informed her that letter request take 7-10 days and that she can also reach out to her PCP. Patient verbalized understanding.

## 2021-02-24 NOTE — Telephone Encounter (Signed)
Patient is checking on the status of the letter please call

## 2021-02-27 ENCOUNTER — Other Ambulatory Visit: Payer: Self-pay

## 2021-02-27 MED FILL — Topiramate Tab 50 MG: ORAL | 30 days supply | Qty: 120 | Fill #0 | Status: AC

## 2021-02-27 MED FILL — Levetiracetam Tab 1000 MG: ORAL | 30 days supply | Qty: 60 | Fill #0 | Status: CN

## 2021-03-01 ENCOUNTER — Encounter: Payer: Self-pay | Admitting: Internal Medicine

## 2021-03-01 ENCOUNTER — Ambulatory Visit: Payer: Self-pay | Attending: Internal Medicine | Admitting: Internal Medicine

## 2021-03-01 ENCOUNTER — Other Ambulatory Visit: Payer: Self-pay

## 2021-03-01 VITALS — BP 120/70 | HR 55 | Resp 16 | Wt 155.6 lb

## 2021-03-01 DIAGNOSIS — I10 Essential (primary) hypertension: Secondary | ICD-10-CM

## 2021-03-01 DIAGNOSIS — I83892 Varicose veins of left lower extremities with other complications: Secondary | ICD-10-CM

## 2021-03-01 DIAGNOSIS — M7989 Other specified soft tissue disorders: Secondary | ICD-10-CM

## 2021-03-01 DIAGNOSIS — M79662 Pain in left lower leg: Secondary | ICD-10-CM

## 2021-03-01 NOTE — Patient Instructions (Signed)
Purchase a large compression socks to wear on the left lower extremity. Try to keep the left leg elevated when sitting or laying down. Apply for the orange card/cone discount card so that we can have you see a vascular surgeon for the symptomatic varicose veins.  You can get these forms from the front desk.

## 2021-03-01 NOTE — Progress Notes (Addendum)
Patient ID: Tammy [name], female    DOB: [birth year]  MRN: [id number]  CC: Leg Swelling (B/l)   Subjective: Tammy [name] is a 55 y.o. female who presents for eval of swelling in legs.  Previous PCP was Dr. Chapman Fitch who is no longer with the practice. 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  Has varicose veins that are worse in the left leg.  Left leg is painful and feel heavy at times when she walks.  No pain or soreness over the varicose veins at this time.   Has compression socks but too tight.   HTN:  Taking Norvasc and took already today. No device to check BP Limits salt in foods No chest pains or shortness of breath.  SZ disorder:  Saw Dr. Delice Lesch last week.  Looks like she may be switching from Keppra to Topamax.  Neurology note is not completed as yet.  Currently staying with an aunt who is a year older than her.  The aunt helps to look out for her given her seizure disorder as most of her seizures occur at night.  She is requesting a letter that her aunt can give to the apartment management office letting them know that the patient is staying with her so that she can assist in her care.   Patient Active Problem List   Diagnosis Date Noted  . Acute metabolic encephalopathy 16/38/4665  . COVID-19 virus infection 12/05/2020  . Acute superficial venous thrombosis of lower extremity, left 12/13/2018  . Essential hypertension 12/13/2018  . Chronic migraine 09/07/2016  . Seizures ([place]) 08/21/2016  . Seizure ([place]) 08/20/2016  . Asthma 08/20/2016  . Nausea and vomiting 08/20/2016  . Elevated blood pressure 08/20/2016  . DENTAL PAIN 02/12/2009  . DVT, HX OF 01/20/2008  . FIBROIDS, UTERUS 01/16/2008  . COUGH 01/16/2008  .  EMBOLISM/THROMBOSIS, DEEP VSL LWR EXTRM NOS 06/03/2007     Current Outpatient Medications on File Prior to Visit  Medication Sig Dispense Refill  . acetaminophen (TYLENOL) 500 MG tablet Take 500 mg by mouth every 6 (six) hours as needed.    Marland Kitchen amLODipine (NORVASC) 5 MG tablet TAKE 1 TABLET (5 MG TOTAL) BY MOUTH DAILY. 30 tablet 2  . divalproex (DEPAKOTE) 500 MG DR tablet TAKE 1 TABLET (500 MG TOTAL) BY MOUTH 2 (TWO) TIMES DAILY. 60 tablet 11  . levETIRAcetam (KEPPRA) 1000 MG tablet TAKE 1 TABLET (1,000 MG TOTAL) BY MOUTH 2 (TWO) TIMES DAILY. 60 tablet 1  . topiramate (TOPAMAX) 50 MG tablet TAKE 1/2 TAB 2 TIMES DAILY X 1 WEEK,THEN INCREASE TO 1 TAB 2 TIMES DAILY X 1 WEEK,THEN INCREASE TO 2 TABS 2 TIMES DAILY & CONTINUE 120 tablet 11  . [DISCONTINUED] lisinopril-hydrochlorothiazide (ZESTORETIC) 10-12.5 MG tablet Take 2 tablets by mouth daily. To lower blood pressure (Patient not taking: Reported on 12/05/2020) 60 tablet 2   No current facility-administered medications on file prior to visit.    Allergies  Allergen Reactions  . Dilantin [Phenytoin] Swelling  . Aspirin Other (See Comments)    Patient unsure of reaction   . Tramadol Itching and Other (See Comments)    Pt states she ins unsure of reaction Pt states she ins unsure of reaction Pt states she ins unsure of reaction Pt states she ins unsure of reaction  Social History   Socioeconomic History  . Marital status: Single    Spouse name: Not on file  . Number of children: Not on file  . Years of education: Not on file  . Highest education level: Not on file  Occupational History  . Not on file  Tobacco Use  . Smoking status: Never Smoker  . Smokeless tobacco: Never Used  Vaping Use  . Vaping Use: Never used  Substance and Sexual Activity  . Alcohol use: No  . Drug use: Yes    Types: Marijuana  . Sexual activity: Not on file  Other Topics Concern  . Not on file  Social History Narrative   Right handed    Lives with  family    Social Determinants of Health   Financial Resource Strain: Not on file  Food Insecurity: Not on file  Transportation Needs: Not on file  Physical Activity: Not on file  Stress: Not on file  Social Connections: Not on file  Intimate Partner Violence: Not on file    Family History  Problem Relation Age of Onset  . Heart disease Mother   . Migraines Mother   . Heart attack Father     Past Surgical History:  Procedure Laterality Date  . HERNIA REPAIR    . UTERINE FIBROID SURGERY      ROS: Review of Systems Negative except as stated above  PHYSICAL EXAM: BP 120/70   Pulse (!) 55   Resp 16   Wt 155 lb 9.6 oz (70.6 kg)   LMP 03/08/2016   SpO2 99%   BMI 27.13 kg/m   Wt Readings from Last 3 Encounters:  03/01/21 155 lb 9.6 oz (70.6 kg)  02/22/21 156 lb 6.4 oz (70.9 kg)  12/22/20 151 lb 6.4 oz (68.7 kg)    Physical Exam  General appearance - alert, well appearing, and in no distress Mental status - normal mood, behavior, speech, dress, motor activity, and thought processes Chest - clear to auscultation, no wheezes, rales or rhonchi, symmetric air entry Heart -regular rate and rhythm with occasional ectopy.   Extremities -left calf is notably larger than the right.  She has prominent serpiginous superficial veins that are nontender to touch over the shin and calf.  She also has varicose veins on the dorsal surface of the left foot mild pitting edema.  Right lower leg: Mild varicose veins noted on the leg and foot   CMP Latest Ref Rng & Units 12/22/2020 12/08/2020 12/07/2020  Glucose 65 - 99 mg/dL 87 92 90  BUN 6 - 24 mg/dL 18 8 7   Creatinine 0.57 - 1.00 mg/dL 0.74 0.91 0.86  Sodium 134 - 144 mmol/L 142 140 137  Potassium 3.5 - 5.2 mmol/L 4.9 3.5 3.3(L)  Chloride 96 - 106 mmol/L 100 102 100  CO2 20 - 29 mmol/L 30(H) 27 26  Calcium 8.7 - 10.2 mg/dL 9.8 9.4 9.5  Total Protein 6.0 - 8.5 g/dL 7.4 6.4(L) 6.4(L)  Total Bilirubin 0.0 - 1.2 mg/dL <0.2 0.7 0.3   Alkaline Phos 44 - 121 IU/L 53 75 36(L)  AST 0 - 40 IU/L 14 38 61(H)  ALT 0 - 32 IU/L 6 18 19    Lipid Panel  No results found for: CHOL, TRIG, HDL, CHOLHDL, VLDL, LDLCALC, LDLDIRECT  CBC    Component Value Date/Time   WBC 7.4 12/22/2020 1629   WBC 4.3 12/08/2020 0418   RBC 4.69 12/22/2020 1629   RBC 4.53 12/08/2020 0418   HGB 13.6  12/22/2020 1629   HCT 41.4 12/22/2020 1629   PLT 245 12/22/2020 1629   MCV 88 12/22/2020 1629   MCH 29.0 12/22/2020 1629   MCH 30.2 12/08/2020 0418   MCHC 32.9 12/22/2020 1629   MCHC 33.7 12/08/2020 0418   RDW 12.2 12/22/2020 1629   LYMPHSABS 2.5 12/22/2020 1629   MONOABS 0.7 12/08/2020 0418   EOSABS 0.1 12/22/2020 1629   BASOSABS 0.0 12/22/2020 1629    ASSESSMENT AND PLAN: 1. Symptomatic varicose veins of left lower extremity -Recommend purchasing another compression socks and make sure that the size is larger extra-large to be able to fit more comfortably. Recommend applying for the orange card/cone discount card so that we can refer to vein and vascular in the future  2. Pain and swelling of lower leg, left Given previous history of superficial venous thrombosis that was close to the deep veins, and current complaint of pain in the leg, we will get a Doppler ultrasound - VAS [place] LOWER EXTREMITY VENOUS (DVT); Future  3. Essential hypertension At goal.  Continue current medications and low-salt diet  I told patient that I will write the letter requesting that she be allowed to stay with her aunt.  However I forgot to ask the name of her aunt to include in the letter.  We will have my CMA call her to inquire.   Patient was given the opportunity to ask questions.  Patient verbalized understanding of the plan and was able to repeat key elements of the plan.   Orders Placed This Encounter  Procedures  . VAS [place] LOWER EXTREMITY VENOUS (DVT)     Requested Prescriptions    No prescriptions requested or ordered in this encounter    Return  in about 2 months (around 05/01/2021).  Karle Plumber, MD, FACP

## 2021-03-02 ENCOUNTER — Encounter (HOSPITAL_COMMUNITY): Payer: Self-pay

## 2021-03-02 ENCOUNTER — Telehealth: Payer: Self-pay | Admitting: Internal Medicine

## 2021-03-02 NOTE — Telephone Encounter (Signed)
-----   Message from Jackelyn Knife, [place] sent at 03/02/2021 11:49 AM EDT ----- Elenor Legato name is Lorelee Market  ----- Message ----- From: Ladell Pier, MD Sent: 03/01/2021  11:19 PM EDT To: Jackelyn Knife, RMA  When you call her to give her the date of the Doppler ultrasound of the leg, please find out from her the name of her aunt.  She wanted me to write a letter requesting that she be allowed to stay with her aunt who helps to take care of her.

## 2021-03-03 ENCOUNTER — Encounter (HOSPITAL_COMMUNITY): Payer: Self-pay

## 2021-03-04 ENCOUNTER — Ambulatory Visit (HOSPITAL_COMMUNITY)
Admission: RE | Admit: 2021-03-04 | Discharge: 2021-03-04 | Disposition: A | Discharge status: Home / Self Care | Payer: Self-pay | Source: Ambulatory Visit | Attending: Internal Medicine | Admitting: Internal Medicine

## 2021-03-04 ENCOUNTER — Other Ambulatory Visit: Payer: Self-pay

## 2021-03-04 DIAGNOSIS — M7989 Other specified soft tissue disorders: Secondary | ICD-10-CM

## 2021-03-04 DIAGNOSIS — M79662 Pain in left lower leg: Secondary | ICD-10-CM

## 2021-03-05 NOTE — Progress Notes (Signed)
Let patient know that the recent ultrasound of her leg did not reveal any clots in the deep veins.  However she has some old small clots in the varicose veins.  I recommend wearing compression socks as was discussed on her recent visit.  She can use ibuprofen as needed if ever she has pain or swelling in one of the varicose veins and try to keep the leg elevated when sitting or lying down.  Apply for the orange card.  Once approved we can refer her to vein and vascular specialist.  The ultrasound also showed that she may have the cyst in the back of the knee. I will ask the radiologist to confirm that it is cyst and get back to her.

## 2021-03-08 ENCOUNTER — Encounter: Payer: Self-pay | Admitting: Neurology

## 2021-03-08 MED FILL — Amlodipine Besylate Tab 5 MG (Base Equivalent): ORAL | 30 days supply | Qty: 30 | Fill #0 | Status: CN

## 2021-03-08 NOTE — Telephone Encounter (Signed)
Will close encounter, PCP has written letter for her.

## 2021-03-09 ENCOUNTER — Telehealth: Payer: Self-pay | Admitting: Internal Medicine

## 2021-03-09 ENCOUNTER — Other Ambulatory Visit: Payer: Self-pay

## 2021-03-09 MED FILL — Divalproex Sodium Tab Delayed Release 500 MG: ORAL | 30 days supply | Qty: 60 | Fill #0 | Status: CN

## 2021-03-09 NOTE — Telephone Encounter (Signed)
Phone call placed to vascular.  I left a message with the triage nurse for Dr. Donzetta Matters regarding the Doppler ultrasound that was done on this patient's leg.  He made mention of a fluid collection in the popliteal area and I just wanted to verify that it was a Baker's cyst.  She states she will ask him and will get back to me.

## 2021-03-09 NOTE — Telephone Encounter (Signed)
-----   Message from Waynetta Sandy, MD sent at 03/09/2021  9:37 AM EDT ----- Regarding: RE: ? about Doppler US done on LT leg It appears likely a bakers cyst ----- Message ----- From: Ladell Pier, MD Sent: 03/05/2021   3:05 PM EDT To: Waynetta Sandy, MD Subject: ? about Doppler US done on LT leg              I am the PCP for this patient.  You mentioned that there is a fluid collection behind the knee.  Please confirm whether you think this is a Baker's cyst.  Thanks.

## 2021-03-14 ENCOUNTER — Other Ambulatory Visit: Payer: Self-pay

## 2021-03-16 ENCOUNTER — Other Ambulatory Visit: Payer: Self-pay

## 2021-03-16 MED FILL — Amlodipine Besylate Tab 5 MG (Base Equivalent): ORAL | 30 days supply | Qty: 30 | Fill #0 | Status: AC

## 2021-03-16 MED FILL — Divalproex Sodium Tab Delayed Release 500 MG: ORAL | 30 days supply | Qty: 60 | Fill #0 | Status: AC

## 2021-04-04 ENCOUNTER — Telehealth: Payer: Self-pay | Admitting: Neurology

## 2021-04-04 NOTE — Telephone Encounter (Signed)
Pt called and informed of how to take her medication she has not started her Topirmate yet from her 3/29 visit. Pt stated she could not break the pill in half and wait to call the office until today pt advised to buy a pill cutter if she can not break the pill. She stated she would go buy one. Pt also stated she had a seizure a few days ago that's why she wanted to call. She will start her new medication today after getting pill cutter,   1.Start Topiramate 50mg : Take 1/2 tablet daily for 1 week,  then increase to 1 tablet twice a day for 1 week,  then increase to 2 tablets twice a day and continue  2. Continue Keppra (Levetiracetam for another 3 weeks). Once on the full dose of the Topiramate, start reducing Keppra to  1/2 tab in AM, 1 tab in PM for 1 week,  then 1/2 tablet twice a day for 1 week,  then 1/2 tablet every night for 1 week, then stop  3. Continue Depakote 500mg  twice a day

## 2021-04-04 NOTE — Telephone Encounter (Signed)
New message    Patient did not disclose any information will discuss further when the nurse call back.

## 2021-04-20 ENCOUNTER — Other Ambulatory Visit: Payer: Self-pay

## 2021-04-20 MED FILL — Divalproex Sodium Tab Delayed Release 500 MG: ORAL | 30 days supply | Qty: 60 | Fill #1 | Status: CN

## 2021-04-20 MED FILL — Amlodipine Besylate Tab 5 MG (Base Equivalent): ORAL | 30 days supply | Qty: 30 | Fill #1 | Status: CN

## 2021-04-26 ENCOUNTER — Other Ambulatory Visit: Payer: Self-pay

## 2021-04-27 ENCOUNTER — Other Ambulatory Visit: Payer: Self-pay

## 2021-04-29 ENCOUNTER — Other Ambulatory Visit: Payer: Self-pay

## 2021-04-29 MED FILL — Divalproex Sodium Tab Delayed Release 500 MG: ORAL | 30 days supply | Qty: 60 | Fill #1 | Status: AC

## 2021-04-29 MED FILL — Amlodipine Besylate Tab 5 MG (Base Equivalent): ORAL | 30 days supply | Qty: 30 | Fill #1 | Status: AC

## 2021-05-04 ENCOUNTER — Other Ambulatory Visit: Payer: Self-pay

## 2021-05-04 ENCOUNTER — Emergency Department (HOSPITAL_COMMUNITY)
Admission: EM | Admit: 2021-05-04 | Discharge: 2021-05-04 | Disposition: A | Discharge status: Home / Self Care | Payer: Self-pay | Attending: Emergency Medicine | Admitting: Emergency Medicine

## 2021-05-04 DIAGNOSIS — Z79899 Other long term (current) drug therapy: Secondary | ICD-10-CM | POA: Insufficient documentation

## 2021-05-04 DIAGNOSIS — G40909 Epilepsy, unspecified, not intractable, without status epilepticus: Secondary | ICD-10-CM | POA: Insufficient documentation

## 2021-05-04 DIAGNOSIS — I10 Essential (primary) hypertension: Secondary | ICD-10-CM | POA: Insufficient documentation

## 2021-05-04 DIAGNOSIS — J45909 Unspecified asthma, uncomplicated: Secondary | ICD-10-CM | POA: Insufficient documentation

## 2021-05-04 DIAGNOSIS — Z8616 Personal history of COVID-19: Secondary | ICD-10-CM | POA: Insufficient documentation

## 2021-05-04 LAB — BASIC METABOLIC PANEL
Anion gap: 10 (ref 5–15)
BUN: 15 mg/dL (ref 6–20)
CO2: 24 mmol/L (ref 22–32)
Calcium: 9.6 mg/dL (ref 8.9–10.3)
Chloride: 103 mmol/L (ref 98–111)
Creatinine, Ser: 0.75 mg/dL (ref 0.44–1.00)
GFR, Estimated: >60 mL/min (ref >60)
Glucose, Bld: 109 mg/dL — ABNORMAL HIGH (ref 70–99)
Potassium: 3.6 mmol/L (ref 3.5–5.1)
Sodium: 137 mmol/L (ref 135–145)

## 2021-05-04 LAB — CBC WITH DIFFERENTIAL/PLATELET
Abs Immature Granulocytes: 0.01 10*3/uL (ref 0.00–0.07)
Basophils Absolute: 0 10*3/uL (ref 0.0–0.1)
Basophils Relative: 0 %
Eosinophils Absolute: 0 10*3/uL (ref 0.0–0.5)
Eosinophils Relative: 0 %
HCT: 42.2 % (ref 36.0–46.0)
Hemoglobin: 13.2 g/dL (ref 12.0–15.0)
Immature Granulocytes: 0 %
Lymphocytes Relative: 13 %
Lymphs Abs: 1 10*3/uL (ref 0.7–4.0)
MCH: 28.5 pg (ref 26.0–34.0)
MCHC: 31.3 g/dL (ref 30.0–36.0)
MCV: 91.1 fL (ref 80.0–100.0)
Monocytes Absolute: 0.2 10*3/uL (ref 0.1–1.0)
Monocytes Relative: 3 %
Neutro Abs: 6.8 10*3/uL (ref 1.7–7.7)
Neutrophils Relative %: 84 %
Platelets: 262 10*3/uL (ref 150–400)
RBC: 4.63 MIL/uL (ref 3.87–5.11)
RDW: 13.2 % (ref 11.5–15.5)
WBC: 8.1 10*3/uL (ref 4.0–10.5)
nRBC: 0 % (ref 0.0–0.2)

## 2021-05-04 LAB — VALPROIC ACID LEVEL: Valproic Acid Lvl: 81 ug/mL (ref 50.0–100.0)

## 2021-05-04 MED ORDER — ONDANSETRON 4 MG PO TBDP
4.0000 mg | ORAL_TABLET | Freq: Once | ORAL | Status: AC
  Administered 2021-05-04: 4 mg via ORAL
  Filled 2021-05-04: qty 1

## 2021-05-04 NOTE — ED Provider Notes (Signed)
Emergency Medicine Provider Triage Evaluation Note  Tammy [name] , a 55 y.o. female  was evaluated in triage.  Pt complains of unwitnessed seizure that occurred this AM. Reports she has not been taking Topamax as prescribed for the past 2 weeks. Has been compliant with her keppra and depakote. She is currently complaining of a headache and nausea which is typical for her post ictal state per patient. She denies head injury.  Review of Systems  Positive: + seizure, nausea, HA Negative: - head injury  Physical Exam  BP (!) 155/108 (BP Location: Right Arm)   Pulse (!) 109   Temp 97.9 F (36.6 C)   Resp 20   LMP 03/08/2016   SpO2 100%  Gen:   Awake, no distress   Resp:  Normal effort  MSK:   Moves extremities without difficulty  Other:    Medical Decision Making  Medically screening exam initiated at 10:00 AM.  Appropriate orders placed.  Tammy [name] was informed that the remainder of the evaluation will be completed by another provider, this initial triage assessment does not replace that evaluation, and the importance of remaining in the ED until their evaluation is complete.     Tammy Maize, PA-C 05/04/21 1001    Lajean Saver, MD 05/06/21 [phone]

## 2021-05-04 NOTE — Discharge Instructions (Addendum)
You have a Tegretol and Keppra level sent out to the lab for processing.  Please call your neurologist tomorrow so they can follow that result and for further evaluation of your seizures. You have been provided with a pill cutter to begin taking the Topamax as directed. Return to the emergency department as needed.

## 2021-05-04 NOTE — ED Triage Notes (Signed)
Patient arrived by [facility] following seizure this am. Patient has history of same and states taking depakote as prescribed. Complains of headache with nausea since sincere and states this is normal postictal

## 2021-05-04 NOTE — ED Notes (Signed)
The pt has epilepsy  She had a seizure this am and was brought to the ed right after.  She last had a seizure was 2-3 months ago .  No tongue damage  She has been incontinent of bowel and bladder  Alert no distress

## 2021-05-04 NOTE — ED Provider Notes (Signed)
[facility] EMERGENCY DEPARTMENT Provider Note   CSN: [id number] Arrival date & time: 05/04/21  0941     History No chief complaint on file.   Tammy [name] is a 55 y.o. female past medical history of seizure disorder, hypertension, asthma, presenting to the ED for evaluation of seizure.  Has known seizure disorder.  Is followed by Dr. Delice Lesch with [facility] neurology.  She takes Keppra and Depakote. Was supposed to be switching from keppra to topamax for better seizure control. She states she has not been taking the Topamax or decreasing her Keppra because she is concerned about splitting the Topamax pills.  She does not have a pill cutter and does not believe she is responsible for obtain one of those to cut her pills.  States the pharmacy would not cut them for her states she believes her seizure happened overnight as it usually does during her sleep.  She woke up with urine incontinence and nausea vomiting which is typical of her seizures.  She did not have any head trauma she was lying in bed.  She did not bite her tongue.  Has been at her mental baseline soon after arrival to the ED. She is no longer nauseated. No active complaints. No recent illness, no dysuria, no URI sx, no diarrhea. No reported drug or etoh use.  The history is provided by the patient.       Past Medical History:  Diagnosis Date  . Asthma   . Headache(784.0)   . Hypertension   . Seizures [facility])     Patient Active Problem List   Diagnosis Date Noted  . COVID-19 virus infection 12/05/2020  . Acute superficial venous thrombosis of lower extremity, left 12/13/2018  . Essential hypertension 12/13/2018  . Chronic migraine 09/07/2016  . Seizure ([place]) 08/20/2016  . Asthma 08/20/2016  . DVT, HX OF 01/20/2008  . FIBROIDS, UTERUS 01/16/2008    Past Surgical History:  Procedure Laterality Date  . HERNIA REPAIR    . UTERINE FIBROID SURGERY       OB History    Gravida  3   Para      Term       Preterm      AB  1   Living  2     SAB  1   IAB  0   Ectopic      Multiple      Live Births              Family History  Problem Relation Age of Onset  . Heart disease Mother   . Migraines Mother   . Heart attack Father     Social History   Tobacco Use  . Smoking status: Never Smoker  . Smokeless tobacco: Never Used  Vaping Use  . Vaping Use: Never used  Substance Use Topics  . Alcohol use: No  . Drug use: Yes    Types: Marijuana    Home Medications Prior to Admission medications   Medication Sig Start Date End Date Taking? Authorizing Provider  acetaminophen (TYLENOL) 500 MG tablet Take 500 mg by mouth every 6 (six) hours as needed.    [provider]  amLODipine (NORVASC) 5 MG tablet TAKE 1 TABLET (5 MG TOTAL) BY MOUTH DAILY. 02/09/21 02/09/22  Argentina Donovan, PA-C  divalproex (DEPAKOTE) 500 MG DR tablet TAKE 1 TABLET (500 MG TOTAL) BY MOUTH 2 (TWO) TIMES DAILY. 02/22/21 02/22/22  Cameron Sprang, MD  levETIRAcetam [facility])  1000 MG tablet TAKE 1 TABLET (1,000 MG TOTAL) BY MOUTH 2 (TWO) TIMES DAILY. 02/22/21 02/22/22  Cameron Sprang, MD  topiramate (TOPAMAX) 50 MG tablet TAKE 1/2 TAB 2 TIMES DAILY X 1 WEEK,THEN INCREASE TO 1 TAB 2 TIMES DAILY X 1 WEEK,THEN INCREASE TO 2 TABS 2 TIMES DAILY & CONTINUE 02/22/21 02/22/22  Cameron Sprang, MD  lisinopril-hydrochlorothiazide (ZESTORETIC) 10-12.5 MG tablet Take 2 tablets by mouth daily. To lower blood pressure Patient not taking: Reported on 12/05/2020 08/12/20 12/08/20  Camillia Herter, NP    Allergies    Dilantin [phenytoin], Aspirin, and Tramadol  Review of Systems   Review of Systems  Neurological: Positive for seizures.  All other systems reviewed and are negative.   Physical Exam Updated Vital Signs BP 135/89   Pulse 68   Temp 98.3 F (36.8 C)   Resp (!) 9   LMP 03/08/2016   SpO2 98%   Physical Exam Vitals and nursing note reviewed.  Constitutional:      General: She is not in acute  distress.    Appearance: She is well-developed. She is not ill-appearing.  HENT:     Head: Normocephalic and atraumatic.  Eyes:     Conjunctiva/sclera: Conjunctivae normal.  Cardiovascular:     Rate and Rhythm: Normal rate and regular rhythm.  Pulmonary:     Effort: Pulmonary effort is normal. No respiratory distress.     Breath sounds: Normal breath sounds.  Abdominal:     General: Bowel sounds are normal.     Palpations: Abdomen is soft.     Tenderness: There is no abdominal tenderness.  Skin:    General: Skin is warm.  Neurological:     Mental Status: She is alert. Mental status is at baseline.  Psychiatric:        Behavior: Behavior normal.     ED Results / Procedures / Treatments   Labs (all labs ordered are listed, but only abnormal results are displayed) Labs Reviewed  BASIC METABOLIC PANEL - Abnormal; Notable for the following components:      Result Value   Glucose, Bld 109 (*)    All other components within normal limits  CBC WITH DIFFERENTIAL/PLATELET  LEVETIRACETAM LEVEL  CARBAMAZEPINE LEVEL, TOTAL  CBG MONITORING, ED    EKG None  Radiology No results found.  Procedures Procedures   Medications Ordered in ED Medications  ondansetron (ZOFRAN-ODT) disintegrating tablet 4 mg (4 mg Oral Given 05/04/21 1007)    ED Course  I have reviewed the triage vital signs and the nursing notes.  Pertinent labs & imaging results that were available during my care of the patient were reviewed by me and considered in my medical decision making (see chart for details).    MDM Rules/Calculators/A&P                          Patient with known seizure d/o followed by Dr. Delice Lesch, presenting after suspected seizure in her sleep early this morning.  She states she usually has seizures in her sleep.  She had her typical symptoms today, she is at her mental baseline shortly after arriving to the ED.  No symptoms of illness.  No UTI symptoms.  Blood work obtained in triage is  unremarkable.  Will send Depakote and Keppra levels.  Has not been switching her Keppra to Topamax as instructed by neurology for better seizure control due to lack of a pill cutter.  States she  is worried about cutting the pills.  She is provided a pill cutter here, encourage she began that medication transition as directed by neurology.  No further work-up indicated at this time.  Recommend she call neurology to follow her Depakote and Keppra levels which are sent and pending at discharge.  Return as needed  Discussed results, findings, treatment and follow up. Patient advised of return precautions. Patient verbalized understanding and agreed with plan.  Final Clinical Impression(s) / ED Diagnoses Final diagnoses:  Seizure disorder [facility])    Rx / DC Orders ED Discharge Orders    None       Naryah Clenney, [place] N, PA-C 05/04/21 1910    Luna Fuse, MD 05/04/21 1927

## 2021-05-04 NOTE — ED Notes (Signed)
Patient verbalized understanding of discharge instructions. Opportunity for questions and answers.  

## 2021-05-06 ENCOUNTER — Ambulatory Visit: Payer: Self-pay | Admitting: Internal Medicine

## 2021-05-07 LAB — LEVETIRACETAM LEVEL: Levetiracetam Lvl: <1 ug/mL — ABNORMAL LOW (ref 10.0–40.0)

## 2021-05-09 ENCOUNTER — Ambulatory Visit: Payer: Self-pay | Admitting: Internal Medicine

## 2021-05-26 ENCOUNTER — Inpatient Hospital Stay: Payer: Self-pay | Admitting: Internal Medicine

## 2021-05-31 ENCOUNTER — Other Ambulatory Visit: Payer: Self-pay

## 2021-05-31 ENCOUNTER — Encounter: Payer: Self-pay | Admitting: Neurology

## 2021-05-31 ENCOUNTER — Other Ambulatory Visit: Payer: Self-pay | Admitting: Physician Assistant

## 2021-05-31 ENCOUNTER — Ambulatory Visit (INDEPENDENT_AMBULATORY_CARE_PROVIDER_SITE_OTHER): Payer: Self-pay | Admitting: Neurology

## 2021-05-31 VITALS — BP 125/79 | HR 56 | Ht 63.0 in | Wt 143.8 lb

## 2021-05-31 DIAGNOSIS — G40009 Localization-related (focal) (partial) idiopathic epilepsy and epileptic syndromes with seizures of localized onset, not intractable, without status epilepticus: Secondary | ICD-10-CM

## 2021-05-31 DIAGNOSIS — G43709 Chronic migraine without aura, not intractable, without status migrainosus: Secondary | ICD-10-CM

## 2021-05-31 MED ORDER — DIVALPROEX SODIUM 500 MG PO DR TAB
DELAYED_RELEASE_TABLET | Freq: Two times a day (BID) | ORAL | 11 refills | Status: DC
  Filled 2021-05-31: qty 60, 30d supply, fill #0
  Filled 2021-07-01 – 2021-07-08 (×2): qty 60, 30d supply, fill #1
  Filled 2021-08-03: qty 60, 30d supply, fill #2
  Filled 2021-09-09: qty 60, 30d supply, fill #3

## 2021-05-31 MED ORDER — AMLODIPINE BESYLATE 5 MG PO TABS
ORAL_TABLET | Freq: Every day | ORAL | 2 refills | Status: DC
  Filled 2021-05-31: qty 30, 30d supply, fill #0
  Filled 2021-06-29: qty 30, 30d supply, fill #1
  Filled 2021-07-26 – 2021-08-03 (×2): qty 30, 30d supply, fill #2

## 2021-05-31 MED ORDER — TOPIRAMATE 50 MG PO TABS
ORAL_TABLET | ORAL | 11 refills | Status: DC
  Filled 2021-05-31 – 2021-06-16 (×3): qty 120, 30d supply, fill #0
  Filled 2021-07-20: qty 120, 30d supply, fill #1
  Filled 2021-09-05: qty 120, 30d supply, fill #2

## 2021-05-31 NOTE — Progress Notes (Signed)
NEUROLOGY FOLLOW UP OFFICE NOTE  [name] [id number] 06-02-1966  HISTORY OF PRESENT ILLNESS: I had the pleasure of seeing Tammy [name] in follow-up in the neurology clinic on 05/31/2021.  The patient was last seen 3 months ago for seizures and migraines. EEG showed left anterior temporal polyspikes. MRI brain no acute changes. On her last visit, she continued to report episodes of confusion, olfactory hallucinations, as well as more depression. She was started on Topiramate for seizure and migraine prophylaxis, with instructions to wean off Levetiracetam potentially affecting mood. She is also on Depakote 500mg  BID. She was in the ER for a nocturnal seizure on 05/04/21 where she woke up with urinary incontinence and nausea/vomiting. At that time she had not started Topiramate and was still on Keppra. She reports today that she has weaned off the [place] and takes Topiramate 100mg  BID without side effects. She is not so grumpy any more. She reports continued seizures, she had a little one over a week ago, then another one 2 nights ago where she woke up with the covers wet due to urinary incontinence. She continues to report confusion, around 2 months ago she tried to go walking and got confused not knowing where she was going. Family picked her up on the side of the road. She reports daytime drowsiness. Headaches appear better. She again becomes emotional due to the toll epilepsy has taken on her (inability to drive, live alone). She reports weight loss, goiter, and vision changes.   History on Initial Assessment 02/22/2021: This is a 55 year old right-handed woman with a history of hypertension, migraines, complex partial seizures with secondary generalization, presenting to establish care. Seizures started in childhood, she was having frequent passing out episodes. She had a nocturnal seizure in 2010 and was started on Dilantin, lost to follow-up until 2017 when she saw neurologist Dr. Krista [name] and  reported 2-3 seizures every month preceded by lights flashing, bell ringing, then loss of consciousness. Dilantin apparently caused throat swelling, she was taking Keppra. She was started on Depakote in 2017 for seizure and migraine prophylaxis. She was admitted to [facility] in January 2022 after she was found unresponsive, with tongue bite and incontinence. She remained encephalopathic in the ER, then had gaze deviation, not following commands. She was found to be COVID positive. She was given IV Ativan and Keppra, EEG after showed diffuse slowing with left anterior temporal polyspikes, at times with PLEDs-like appearance. She started returning to baseline around 48 hours later. 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 She was discharged home on Depakote 500mg  BID (which she was taking prior to admission, level 68 on admission) and Levetiracetam 1000mg  BID.  Since hospital discharge, she has been living with her aunt. Prior to this, she was living with a different aunt. Family tries to be with her 24/7, rotating with each other. She reports majority of her seizures are nocturnal, she has 2-3 nocturnal seizures a month. She also reports episodes of confusion, she brings a piece of paper where she wrote down gibberish words when she was asked to write her name and SSN. She has occasional olfactory hallucinations where she smells a stench. When she got of of the hospital last January, she noticed right-sided weakness. She was fine yesterday, but this morning woke up with the left side of her body hurting. She reports sharp pain radiating up to her hip. No falls since January. She denies any numbness/tingling, she has  occasional urinary incontinence. She has occasional uncontrollable hand tremors, L>R. Migraines started at age 55, pain usually starts on the left side of her head and radiates diffusely. She would be sensitive to lights and sounds with occasional  nausea. She has migraines 2-3 times a week. She used to take sumatriptan but felt it increased her appetite. She usually takes Tylenol which does not help much. She brings a note she wrote in 2012 about her need to see a doctor about her feelings, not just to push pills but to listen to her. She notes depression and is tearful several times in the office today. She reports the medications make her tired, make her sad. She makes herself get up, otherwise she cannot do anything. Since January, she can't even cook for herself anymore. She works for [employer] in [place] for a week every few months. She does not drive. She reports a significant amount of weight loss and points to her goiter. She denies any dizziness, she has occasional neck pain and double vision. She reports left calf swelling and left hip pain.  Epilepsy Risk Factors:  Her grandson has seizures. 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 She had a bad head injury in 1988 in the [place] where her head was hit by the bunk bed. There is no history of febrile convulsions, CNS infections such as meningitis/encephalitis, significant traumatic brain injury, neurosurgical procedures.  Prior AEDs: Dilantin, Keppra  Diagnostic Data: EEGs: 11/2020 prolonged EEG showed polyspikes in the left anterior temporal region MRI: MRI brain with and without contrast in 11/2020 no acute changes, there was mild chronic microvascular disease, hippocampi symmetric with no abnormal signal or enhancement   PAST MEDICAL HISTORY: Past Medical History:  Diagnosis Date   Asthma    Headache(784.0)    Hypertension    Seizures ([place])     MEDICATIONS: Current Outpatient Medications on File Prior to Visit  Medication Sig Dispense Refill   acetaminophen (TYLENOL) 500 MG tablet Take 500 mg by mouth every 6 (six) hours as needed.     amLODipine (NORVASC) 5 MG tablet TAKE 1 TABLET (5 MG TOTAL) BY MOUTH DAILY. 30 tablet 2   divalproex (DEPAKOTE) 500  MG DR tablet TAKE 1 TABLET (500 MG TOTAL) BY MOUTH 2 (TWO) TIMES DAILY. 60 tablet 11   levETIRAcetam (KEPPRA) 1000 MG tablet TAKE 1 TABLET (1,000 MG TOTAL) BY MOUTH 2 (TWO) TIMES DAILY. 60 tablet 1   topiramate (TOPAMAX) 50 MG tablet TAKE 1/2 TAB 2 TIMES DAILY X 1 WEEK,THEN INCREASE TO 1 TAB 2 TIMES DAILY X 1 WEEK,THEN INCREASE TO 2 TABS 2 TIMES DAILY & CONTINUE 120 tablet 11   [DISCONTINUED] lisinopril-hydrochlorothiazide (ZESTORETIC) 10-12.5 MG tablet Take 2 tablets by mouth daily. To lower blood pressure (Patient not taking: Reported on 12/05/2020) 60 tablet 2   No current facility-administered medications on file prior to visit.    ALLERGIES: Allergies  Allergen Reactions   Dilantin [Phenytoin] Swelling   Aspirin Other (See Comments)    Patient unsure of reaction    Tramadol Itching and Other (See Comments)    Pt states she ins unsure of reaction Pt states she ins unsure of reaction Pt states she ins unsure of reaction Pt states she ins unsure of reaction    FAMILY HISTORY: Family History  Problem Relation Age of Onset   Heart disease Mother    Migraines Mother    Heart attack Father     SOCIAL HISTORY: Social  History   Socioeconomic History   Marital status: Single    Spouse name: Not on file   Number of children: Not on file   Years of education: Not on file   Highest education level: Not on file  Occupational History   Not on file  Tobacco Use   Smoking status: Never   Smokeless tobacco: Never  Vaping Use   Vaping Use: Never used  Substance and Sexual Activity   Alcohol use: No   Drug use: Yes    Types: Marijuana   Sexual activity: Not on file  Other Topics Concern   Not on file  Social History Narrative   Right handed    Lives with family    Social Determinants of Health   Financial Resource Strain: Not on file  Food Insecurity: Not on file  Transportation Needs: Not on file  Physical Activity: Not on file  Stress: Not on file  Social Connections:  Not on file  Intimate Partner Violence: Not on file     PHYSICAL EXAM: Vitals:   05/31/21 1034  BP: 125/79  Pulse: (!) 56  SpO2: 99%   General: No acute distress Head:  Normocephalic/atraumatic Skin/Extremities: No rash, no edema Neurological Exam: alert and awake. No aphasia or dysarthria. Fund of knowledge is appropriate.  Recent and remote memory are impaired.  Attention and concentration are normal.   Cranial nerves: Pupils equal, round. Extraocular movements intact with no nystagmus. Visual fields full.  No facial asymmetry.  Motor: Bulk and tone normal, muscle strength 5/5 throughout with no pronator drift.   Finger to nose testing intact.  Gait narrow-based and steady, able to tandem walk adequately.  Romberg negative.   IMPRESSION: This is a 55 yo RH woman with a history of hypertension, migraines, and left temporal lobe epilepsy. MRI brain unremarkable, EEG showed left anterior temporal polyspikes. She continues to report nocturnal seizures and episodes of confusion. She reports daytime drowsiness and will take Topiramate 50mg  in AM, 150mg  in PM, continue Depakote 500mg  BID. She is concerned about goiter, weight loss and vision changes, and will speak to her PCP. Mood changes improved off Levetiracetam however she is understandably still dealing with her diagnosis and effects on her lifestyle, advised seeing a therapist or joining a support group. She does not drive. Follow-up in 4-5 months, call for any changes.    Thank you for allowing me to participate in her care.  Please do not hesitate to call for any questions or concerns.    Ellouise Newer, M.D.   CC: Dr. Wynetta Emery

## 2021-05-31 NOTE — Patient Instructions (Signed)
Take the Topamax 50mg : 1 tablet in AM, 3 tablets in PM  2. Continue Depakote 500mg  twice a day  3. Please contact your family physician to discuss weight loss, vision changes  4. Contact the [facility] [place] to ask about resources such as Support Groups (tel. No. [phone])  5. Follow-up in 4-5 months, call for any changes   Seizure Precautions: 1. If medication has been prescribed for you to prevent seizures, take it exactly as directed.  Do not stop taking the medicine without talking to your doctor first, even if you have not had a seizure in a long time.   2. Avoid activities in which a seizure would cause danger to yourself or to others.  Don't operate dangerous machinery, swim alone, or climb in high or dangerous places, such as on ladders, roofs, or girders.  Do not drive unless your doctor says you may.  3. If you have any warning that you may have a seizure, lay down in a safe place where you can't hurt yourself.    4.  No driving for 6 months from last seizure, as per [facility].   Please refer to the following link on the [place] website for more information: http://www.epilepsyfoundation.org/answerplace/Social/driving/drivingu.cfm   5.  Maintain good sleep hygiene. Avoid alcohol.  6.  Contact your doctor if you have any problems that may be related to the medicine you are taking.  7.  Call 911 and bring the patient back to the ED if:        A.  The seizure lasts longer than 5 minutes.       B.  The patient doesn't awaken shortly after the seizure  C.  The patient has new problems such as difficulty seeing, speaking or moving  D.  The patient was injured during the seizure  E.  The patient has a temperature over 102 F (39C)  F.  The patient vomited and now is having trouble breathing

## 2021-06-02 ENCOUNTER — Other Ambulatory Visit: Payer: Self-pay

## 2021-06-15 ENCOUNTER — Other Ambulatory Visit: Payer: Self-pay

## 2021-06-16 ENCOUNTER — Other Ambulatory Visit: Payer: Self-pay

## 2021-06-22 ENCOUNTER — Telehealth: Payer: Self-pay | Admitting: Physician Assistant

## 2021-06-22 ENCOUNTER — Ambulatory Visit: Payer: Self-pay | Attending: Physician Assistant | Admitting: Physician Assistant

## 2021-06-22 ENCOUNTER — Other Ambulatory Visit: Payer: Self-pay

## 2021-06-22 DIAGNOSIS — M7989 Other specified soft tissue disorders: Secondary | ICD-10-CM

## 2021-06-22 DIAGNOSIS — R251 Tremor, unspecified: Secondary | ICD-10-CM

## 2021-06-22 DIAGNOSIS — R569 Unspecified convulsions: Secondary | ICD-10-CM

## 2021-06-22 NOTE — Progress Notes (Signed)
Virtual Visit via Telephone Note  I connected with Tammy [name] on 06/22/21 at  2:50 PM EDT by telephone and verified that I am speaking with the correct person using two identifiers.  Location: Patient: home Provider: [facility] office   I discussed the limitations, risks, security and privacy concerns of performing an evaluation and management service by telephone and the availability of in person appointments. I also discussed with the patient that there may be a patient responsible charge related to this service. The patient expressed understanding and agreed to proceed.   History of Present Illness:  patient seen in ED 05/04/2021.  She was running out of meds.  Seen by her neurologist 05/31/2021.  She is apparently in the process of trying to qualify for disability.  Today she complains of a tremor she has had on and off for years.  She says she's "been told it's nothing."  She also complains of L leg swelling that has apparently been going on for many years.  She wants to make sure she does not have a blood clot.  No SOB/CP.  There are studies related to this as far back as 2017 from a brief review of her chart.     Observations/Objective:  NAD.  A&Ox3.  Her daughter is in the background saying they will get a lawyer and that I need to give the patient disability.    Assessment and Plan: 1. Tremor ?benign essential tremor - TSH; Future - Vitamin D, 25-hydroxy; Future - B12 and Folate Panel; Future  2. Seizure ([place]) Continue meds and follow up with Dr  Delice Lesch  3. Left leg swelling Not acute-long-standing - VAS [place] LOWER EXTREMITY VENOUS (DVT); Future  I explained to her that I am unable to assist her with disability issues   Follow Up Instructions: See PCP for disability and medical issues in about 2 months   I discussed the assessment and treatment plan with the patient. The patient was provided an opportunity to ask questions and all were answered. The patient agreed with the  plan and demonstrated an understanding of the instructions.   The patient was advised to call back or seek an in-person evaluation if the symptoms worsen or if the condition fails to improve as anticipated.  I provided 18 minutes of non-face-to-face time during this encounter.   Freeman Caldron, PA-C  Patient ID: Tammy [name], female   DOB: 09-Jul-1966, 55 y.o.   MRN: [id number]

## 2021-06-29 ENCOUNTER — Other Ambulatory Visit: Payer: Self-pay

## 2021-06-30 ENCOUNTER — Other Ambulatory Visit: Payer: Self-pay

## 2021-07-01 ENCOUNTER — Other Ambulatory Visit: Payer: Self-pay

## 2021-07-07 ENCOUNTER — Other Ambulatory Visit: Payer: Self-pay

## 2021-07-08 ENCOUNTER — Other Ambulatory Visit: Payer: Self-pay

## 2021-07-20 ENCOUNTER — Other Ambulatory Visit: Payer: Self-pay

## 2021-07-21 ENCOUNTER — Other Ambulatory Visit: Payer: Self-pay

## 2021-07-27 ENCOUNTER — Encounter: Payer: Self-pay | Admitting: Internal Medicine

## 2021-07-27 ENCOUNTER — Other Ambulatory Visit: Payer: Self-pay

## 2021-07-27 ENCOUNTER — Ambulatory Visit: Payer: Self-pay | Admitting: Physician Assistant

## 2021-07-27 NOTE — Progress Notes (Deleted)
Patient ID: Tammy [name], female   DOB: [birth year], 54 y.o.   MRN: [id number]

## 2021-08-03 ENCOUNTER — Other Ambulatory Visit: Payer: Self-pay

## 2021-08-04 ENCOUNTER — Other Ambulatory Visit: Payer: Self-pay

## 2021-08-24 ENCOUNTER — Encounter: Payer: Self-pay | Admitting: Physician Assistant

## 2021-08-24 ENCOUNTER — Other Ambulatory Visit: Payer: Self-pay

## 2021-08-24 ENCOUNTER — Ambulatory Visit: Payer: Self-pay | Attending: Physician Assistant | Admitting: Physician Assistant

## 2021-08-24 ENCOUNTER — Other Ambulatory Visit (HOSPITAL_COMMUNITY)
Admission: RE | Admit: 2021-08-24 | Discharge: 2021-08-24 | Disposition: A | Discharge status: Home / Self Care | Payer: Self-pay | Source: Ambulatory Visit | Attending: Physician Assistant | Admitting: Physician Assistant

## 2021-08-24 VITALS — BP 135/84 | HR 63 | Resp 16 | Wt 141.8 lb

## 2021-08-24 DIAGNOSIS — N898 Other specified noninflammatory disorders of vagina: Secondary | ICD-10-CM | POA: Insufficient documentation

## 2021-08-24 DIAGNOSIS — R569 Unspecified convulsions: Secondary | ICD-10-CM

## 2021-08-24 DIAGNOSIS — I10 Essential (primary) hypertension: Secondary | ICD-10-CM

## 2021-08-24 DIAGNOSIS — R251 Tremor, unspecified: Secondary | ICD-10-CM

## 2021-08-24 DIAGNOSIS — E049 Nontoxic goiter, unspecified: Secondary | ICD-10-CM

## 2021-08-24 MED ORDER — AMLODIPINE BESYLATE 5 MG PO TABS
ORAL_TABLET | Freq: Every day | ORAL | 1 refills | Status: DC
  Filled 2021-08-24: qty 90, fill #0
  Filled 2021-09-05: qty 30, 30d supply, fill #0
  Filled 2021-10-05: qty 90, 90d supply, fill #1
  Filled 2022-01-04: qty 90, 90d supply, fill #2
  Filled 2022-01-05: qty 30, 30d supply, fill #0
  Filled 2022-02-03: qty 30, 30d supply, fill #1

## 2021-08-24 NOTE — Progress Notes (Signed)
Tammy [name], is a 55 y.o. female  [id number]  [id number]  DOB - 1965/12/06  Chief Complaint  Patient presents with   Thyroid Problem       Subjective:   Tammy [name] is a 55 y.o. female here today for f/up with tremor(see previous note).  She never came in for those labs.  She has a h/o thyroid bx and feels her thyroid has gotten larger.  She has lost about 18 pounds since 1 year ago-not intentional.    No recent seizures-stable on meds-followed by neurology.    No problems updated.  ALLERGIES: Allergies  Allergen Reactions   Dilantin [Phenytoin] Swelling   Aspirin Other (See Comments)    Patient unsure of reaction    Tramadol Itching and Other (See Comments)    Pt states she ins unsure of reaction Pt states she ins unsure of reaction Pt states she ins unsure of reaction Pt states she ins unsure of reaction    PAST MEDICAL HISTORY: Past Medical History:  Diagnosis Date   Asthma    Headache(784.0)    Hypertension    Seizures ([place])     MEDICATIONS AT HOME: Prior to Admission medications   Medication Sig Start Date End Date Taking? Authorizing Provider  acetaminophen (TYLENOL) 500 MG tablet Take 500 mg by mouth every 6 (six) hours as needed.    [provider]  amLODipine (NORVASC) 5 MG tablet TAKE 1 TABLET (5 MG TOTAL) BY MOUTH DAILY. 08/24/21 08/24/22  Argentina Donovan, PA-C  divalproex (DEPAKOTE) 500 MG DR tablet TAKE 1 TABLET (500 MG TOTAL) BY MOUTH 2 (TWO) TIMES DAILY. 05/31/21 05/31/22  Cameron Sprang, MD  topiramate (TOPAMAX) 50 MG tablet Take 1 tablet by mouth in the morning and  3 tablets in evening 05/31/21   Cameron Sprang, MD  lisinopril-hydrochlorothiazide (ZESTORETIC) 10-12.5 MG tablet Take 2 tablets by mouth daily. To lower blood pressure Patient not taking: Reported on 12/05/2020 08/12/20 12/08/20  Camillia Herter, NP    ROS: Neg HEENT Neg resp Neg cardiac Neg GI Neg GU Neg MS Neg psych Neg neuro/stable  Objective:    Vitals:   08/24/21 1429  BP: 135/84  Pulse: 63  Resp: 16  SpO2: 99%  Weight: 141 lb 12.8 oz (64.3 kg)   Exam General appearance : Awake, alert, not in any distress. Speech Clear. Not toxic looking HEENT: Atraumatic and Normocephalic Neck: Supple, no JVD. No cervical lymphadenopathy. Thyroid very enlarged without discreet nodules Chest: Good air entry bilaterally, CTAB.  No rales/rhonchi/wheezing CVS: S1 S2 regular, no murmurs.  Extremities: B/L Lower Ext shows no edema, both legs are warm to touch Neurology: Awake alert, and oriented X 3, CN II-XII intact, Non focal Skin: No Rash  Data Review No results found for: HGBA1C  Assessment & Plan   1. Vaginal discharge Not SA.   - Cervicovaginal ancillary only  2. Essential hypertension controlled - Comprehensive metabolic panel - amLODipine (NORVASC) 5 MG tablet; TAKE 1 TABLET (5 MG TOTAL) BY MOUTH DAILY.  Dispense: 90 tablet; Refill: 1  3. Goiter Last U/S and bx was 2020 - US THYROID; Future - Comprehensive metabolic panel - Thyroid Panel With TSH  4. Seizure ([facility]) Followed by neurology and stable on depakote  5. Tremor - US THYROID; Future - Vitamin D, 25-hydroxy - B12 and Folate Panel    Patient have been counseled extensively about nutrition and exercise. Other issues discussed during this visit include: low cholesterol diet, weight control and  daily exercise, foot care, annual eye examinations at Ophthalmology, importance of adherence with medications and regular follow-up. We also discussed long term complications of uncontrolled diabetes and hypertension.   Return in about 4 months (around 12/24/2021) for PCP/htn and goiter.  The patient was given clear instructions to go to ER or return to medical center if symptoms don't improve, worsen or new problems develop. The patient verbalized understanding. The patient was told to call to get lab results if they haven't Tammy anything in the next week.       Freeman Caldron, PA-C [facility] and [place], [place]   08/24/2021, 2:48 PM

## 2021-08-25 ENCOUNTER — Other Ambulatory Visit: Payer: Self-pay

## 2021-08-25 ENCOUNTER — Other Ambulatory Visit: Payer: Self-pay | Admitting: Physician Assistant

## 2021-08-25 DIAGNOSIS — A599 Trichomoniasis, unspecified: Secondary | ICD-10-CM

## 2021-08-25 DIAGNOSIS — E559 Vitamin D deficiency, unspecified: Secondary | ICD-10-CM

## 2021-08-25 LAB — COMPREHENSIVE METABOLIC PANEL
ALT: 6 IU/L (ref 0–32)
AST: 18 IU/L (ref 0–40)
Albumin/Globulin Ratio: 1.8 (ref 1.2–2.2)
Albumin: 4.2 g/dL (ref 3.8–4.9)
Alkaline Phosphatase: 48 IU/L (ref 44–121)
BUN/Creatinine Ratio: 23 (ref 9–23)
BUN: 20 mg/dL (ref 6–24)
Bilirubin Total: <0.2 mg/dL (ref 0.0–1.2)
CO2: 20 mmol/L (ref 20–29)
Calcium: 9.4 mg/dL (ref 8.7–10.2)
Chloride: 108 mmol/L — ABNORMAL HIGH (ref 96–106)
Creatinine, Ser: 0.87 mg/dL (ref 0.57–1.00)
Globulin, Total: 2.4 g/dL (ref 1.5–4.5)
Glucose: 81 mg/dL (ref 70–99)
Potassium: 4.1 mmol/L (ref 3.5–5.2)
Sodium: 144 mmol/L (ref 134–144)
Total Protein: 6.6 g/dL (ref 6.0–8.5)
eGFR: 80 mL/min/{1.73_m2} (ref >59)

## 2021-08-25 LAB — CERVICOVAGINAL ANCILLARY ONLY
Bacterial Vaginitis (gardnerella): NEGATIVE
Candida Glabrata: NEGATIVE
Candida Vaginitis: NEGATIVE
Chlamydia: NEGATIVE
Comment: NEGATIVE
Comment: NEGATIVE
Comment: NEGATIVE
Comment: NEGATIVE
Comment: NEGATIVE
Comment: NORMAL
Neisseria Gonorrhea: NEGATIVE
Trichomonas: POSITIVE — AB

## 2021-08-25 LAB — B12 AND FOLATE PANEL
Folate: 8.1 ng/mL (ref >3.0)
Vitamin B-12: 925 pg/mL (ref 232–1245)

## 2021-08-25 LAB — THYROID PANEL WITH TSH
Free Thyroxine Index: 1.5 (ref 1.2–4.9)
T3 Uptake Ratio: 27 % (ref 24–39)
T4, Total: 5.4 ug/dL (ref 4.5–12.0)
TSH: 0.936 u[IU]/mL (ref 0.450–4.500)

## 2021-08-25 LAB — VITAMIN D 25 HYDROXY (VIT D DEFICIENCY, FRACTURES): Vit D, 25-Hydroxy: 14.4 ng/mL — ABNORMAL LOW (ref 30.0–100.0)

## 2021-08-25 MED ORDER — METRONIDAZOLE 500 MG PO TABS
500.0000 mg | ORAL_TABLET | Freq: Two times a day (BID) | ORAL | 0 refills | Status: DC
  Filled 2021-08-25 – 2021-09-05 (×2): qty 14, 7d supply, fill #0

## 2021-08-25 MED ORDER — VITAMIN D (ERGOCALCIFEROL) 1.25 MG (50000 UNIT) PO CAPS
50000.0000 [IU] | ORAL_CAPSULE | ORAL | 0 refills | Status: DC
  Filled 2021-08-25 – 2021-09-05 (×2): qty 4, 28d supply, fill #0
  Filled 2021-10-17: qty 4, 28d supply, fill #1

## 2021-08-26 ENCOUNTER — Other Ambulatory Visit: Payer: Self-pay

## 2021-08-31 ENCOUNTER — Ambulatory Visit (HOSPITAL_COMMUNITY): Admission: RE | Admit: 2021-08-31 | Payer: Self-pay | Source: Ambulatory Visit

## 2021-09-02 ENCOUNTER — Other Ambulatory Visit: Payer: Self-pay

## 2021-09-05 ENCOUNTER — Other Ambulatory Visit: Payer: Self-pay

## 2021-09-06 ENCOUNTER — Other Ambulatory Visit: Payer: Self-pay

## 2021-09-08 ENCOUNTER — Ambulatory Visit: Payer: Self-pay | Admitting: Internal Medicine

## 2021-09-09 ENCOUNTER — Ambulatory Visit (HOSPITAL_COMMUNITY): Payer: Self-pay

## 2021-09-09 ENCOUNTER — Other Ambulatory Visit: Payer: Self-pay

## 2021-09-15 ENCOUNTER — Other Ambulatory Visit: Payer: Self-pay

## 2021-10-05 ENCOUNTER — Other Ambulatory Visit: Payer: Self-pay

## 2021-10-05 ENCOUNTER — Encounter: Payer: Self-pay | Admitting: Neurology

## 2021-10-05 ENCOUNTER — Telehealth (INDEPENDENT_AMBULATORY_CARE_PROVIDER_SITE_OTHER): Payer: Self-pay | Admitting: Neurology

## 2021-10-05 VITALS — Ht 62.5 in | Wt 142.0 lb

## 2021-10-05 DIAGNOSIS — G40009 Localization-related (focal) (partial) idiopathic epilepsy and epileptic syndromes with seizures of localized onset, not intractable, without status epilepticus: Secondary | ICD-10-CM

## 2021-10-05 DIAGNOSIS — G43709 Chronic migraine without aura, not intractable, without status migrainosus: Secondary | ICD-10-CM

## 2021-10-05 MED ORDER — SUMATRIPTAN SUCCINATE 50 MG PO TABS
ORAL_TABLET | ORAL | 11 refills | Status: DC
  Filled 2021-10-05: qty 9, 30d supply, fill #0
  Filled 2021-11-29 – 2022-01-12 (×2): qty 9, 30d supply, fill #1
  Filled 2022-01-12: qty 9, 30d supply, fill #0

## 2021-10-05 MED ORDER — TOPIRAMATE 50 MG PO TABS
ORAL_TABLET | ORAL | 11 refills | Status: DC
  Filled 2021-10-05: qty 120, 30d supply, fill #0
  Filled 2021-11-01: qty 360, 90d supply, fill #1
  Filled 2021-12-13: qty 120, 30d supply, fill #1
  Filled 2021-12-14 – 2021-12-20 (×2): qty 120, 30d supply, fill #0
  Filled 2022-01-24: qty 120, 30d supply, fill #1
  Filled 2022-03-07: qty 120, 30d supply, fill #2
  Filled 2022-05-13: qty 120, 30d supply, fill #3
  Filled 2022-08-16: qty 120, 30d supply, fill #0

## 2021-10-05 MED ORDER — DIVALPROEX SODIUM 500 MG PO DR TAB
DELAYED_RELEASE_TABLET | Freq: Two times a day (BID) | ORAL | 11 refills | Status: DC
  Filled 2021-10-05: qty 60, fill #0
  Filled 2021-10-17 – 2021-10-26 (×2): qty 60, 30d supply, fill #0
  Filled 2021-11-21 – 2021-11-30 (×3): qty 60, 30d supply, fill #1
  Filled 2021-11-30 – 2021-12-05 (×2): qty 60, 30d supply, fill #0
  Filled 2022-01-24: qty 60, 30d supply, fill #1
  Filled 2022-02-20: qty 60, 30d supply, fill #2
  Filled 2022-08-16: qty 60, 30d supply, fill #0

## 2021-10-05 NOTE — Progress Notes (Signed)
Virtual Visit via Video Note The purpose of this virtual visit is to provide medical care while limiting exposure to the novel coronavirus.    Consent was obtained for video visit:  Yes.   Answered questions that patient had about telehealth interaction:  Yes.   I discussed the limitations, risks, security and privacy concerns of performing an evaluation and management service by telemedicine. I also discussed with the patient that there may be a patient responsible charge related to this service. The patient expressed understanding and agreed to proceed.  Pt location: Home Physician Location: office Name of referring provider:  Ladell Pier, MD I connected with Tammy [name] at patients initiation/request on 10/05/2021 at  3:00 PM EST by video enabled telemedicine application and verified that I am speaking with the correct person using two identifiers. Pt MRN:  [id number] Pt DOB:  06-30-1966 Video Participants:  Tammy [name]   History of Present Illness:  The patient had a virtual video visit on 10/05/2021. She was last seen 4 months ago for left temporal lobe epilepsy and migraines. EEG showed left anterior temporal polyspikes. MRI brain no acute changes. Since her last visit, there appears to be an improvement in seizures. She reports a few "mini" ones where she gets up in her sleep and urinates on the floor. She has not woken up on the ground. She denies any staring/unresponsive episodes. She notes forgetfulness where she goes to a store and forgets what she went to buy. She continues to have headaches around twice a week and would like to restart sumatriptan. She gets a little dizzy after taking her medications. She gets over 8 hours of sleep but reports daytime drowsiness. No falls. She is unable to have an in-person appointment today due to having a cold. She is taking Topiramate 50mg  in AM, 150mg  in PM and Depakote 500mg  BID with no significant side effects. Mood is better,  she is in good spirits today.   History on Initial Assessment 02/22/2021: This is a 55 year old right-handed woman with a history of hypertension, migraines, complex partial seizures with secondary generalization, presenting to establish care. Seizures started in childhood, she was having frequent passing out episodes. She had a nocturnal seizure in 2010 and was started on Dilantin, lost to follow-up until 2017 when she saw neurologist Dr. Krista Blue and reported 2-3 seizures every month preceded by lights flashing, bell ringing, then loss of consciousness. Dilantin apparently caused throat swelling, she was taking Keppra. She was started on Depakote in 2017 for seizure and migraine prophylaxis. She was admitted to [facility] in January 2022 after she was found unresponsive, with tongue bite and incontinence. She remained encephalopathic in the ER, then had gaze deviation, not following commands. She was found to be COVID positive. She was given IV Ativan and Keppra, EEG after showed diffuse slowing with left anterior temporal polyspikes, at times with PLEDs-like appearance. She started returning to baseline around 48 hours later. 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 She was discharged home on Depakote 500mg  BID (which she was taking prior to admission, level 68 on admission) and Levetiracetam 1000mg  BID.  Since hospital discharge, she has been living with her aunt. Prior to this, she was living with a different aunt. Family tries to be with her 24/7, rotating with each other. She reports majority of her seizures are nocturnal, she has 2-3 nocturnal seizures a month. She also reports episodes of confusion, she  brings a piece of paper where she wrote down gibberish words when she was asked to write her name and SSN. She has occasional olfactory hallucinations where she smells a stench. When she got of of the hospital last January, she noticed right-sided weakness.  She was fine yesterday, but this morning woke up with the left side of her body hurting. She reports sharp pain radiating up to her hip. No falls since January. She denies any numbness/tingling, she has occasional urinary incontinence. She has occasional uncontrollable hand tremors, L>R. Migraines started at age 46, pain usually starts on the left side of her head and radiates diffusely. She would be sensitive to lights and sounds with occasional nausea. She has migraines 2-3 times a week. She used to take sumatriptan but felt it increased her appetite. She usually takes Tylenol which does not help much. She brings a note she wrote in 2012 about her need to see a doctor about her feelings, not just to push pills but to listen to her. She notes depression and is tearful several times in the office today. She reports the medications make her tired, make her sad. She makes herself get up, otherwise she cannot do anything. Since January, she can't even cook for herself anymore. She works for [employer] in [place] for a week every few months. She does not drive. She reports a significant amount of weight loss and points to her goiter. She denies any dizziness, she has occasional neck pain and double vision. She reports left calf swelling and left hip pain.  Epilepsy Risk Factors:  Her grandson has seizures. 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 She had a bad head injury in 1988 in the [place] where her head was hit by the bunk bed. There is no history of febrile convulsions, CNS infections such as meningitis/encephalitis, significant traumatic brain injury, neurosurgical procedures.  Prior AEDs: Dilantin, Keppra  Diagnostic Data: EEGs: 11/2020 prolonged EEG showed polyspikes in the left anterior temporal region MRI: MRI brain with and without contrast in 11/2020 no acute changes, there was mild chronic microvascular disease, hippocampi symmetric with no abnormal signal or enhancement     Current Outpatient Medications on File Prior to Visit  Medication Sig Dispense Refill   acetaminophen (TYLENOL) 500 MG tablet Take 500 mg by mouth every 6 (six) hours as needed.     amLODipine (NORVASC) 5 MG tablet TAKE 1 TABLET (5 MG TOTAL) BY MOUTH DAILY. 90 tablet 1   divalproex (DEPAKOTE) 500 MG DR tablet TAKE 1 TABLET (500 MG TOTAL) BY MOUTH 2 (TWO) TIMES DAILY. 60 tablet 11   topiramate (TOPAMAX) 50 MG tablet Take 1 tablet by mouth in the morning and  3 tablets in evening 120 tablet 11   Vitamin D, Ergocalciferol, (DRISDOL) 1.25 MG (50000 UNIT) CAPS capsule Take 1 capsule (50,000 Units total) by mouth every 7 (seven) days. (Patient not taking: Reported on 10/05/2021) 16 capsule 0   [DISCONTINUED] lisinopril-hydrochlorothiazide (ZESTORETIC) 10-12.5 MG tablet Take 2 tablets by mouth daily. To lower blood pressure (Patient not taking: Reported on 12/05/2020) 60 tablet 2   No current facility-administered medications on file prior to visit.     Observations/Objective:   Vitals:   10/05/21 1445  Weight: 142 lb (64.4 kg)  Height: 5' 2.5" (1.588 m)   GEN:  The patient appears stated age and is in NAD.  Neurological examination: Patient is awake, alert. No aphasia or dysarthria. Intact fluency and comprehension.  Cranial  nerves: Extraocular movements intact with no nystagmus. No facial asymmetry. Motor: moves all extremities symmetrically, at least anti-gravity x 4.    Assessment and Plan:   This is a 55 yo RH woman with a history of hypertension, migraines, and left temporal lobe epilepsy. MRI brain unremarkable, EEG showed left anterior temporal polyspikes. There appears to be a reduction in nocturnal seizures, she was advised to continue seizure calendar. She reports migraines twice a week. Refills for sumatriptan will be sent, she knows to minimize rescue medication to 2-3 a week to avoid rebound headaches. We discussed option of increasing dose of medication, she would like to stay on  Topiramate 50mg  in AM, 150mg  in PM and Depakote 500mg  BID due to daytime drowsiness despite getting over 8 hours of sleep. Consider sleep study if insurance allows. She is aware of [place] driving laws to stop driving after a seizure until 6 months seizure-free. Follow-up in 6 months, call for any changes.     Follow Up Instructions:   -I discussed the assessment and treatment plan with the patient. The patient was provided an opportunity to ask questions and all were answered. The patient agreed with the plan and demonstrated an understanding of the instructions.   The patient was advised to call back or seek an in-person evaluation if the symptoms worsen or if the condition fails to improve as anticipated.    Cameron Sprang, MD

## 2021-10-05 NOTE — Patient Instructions (Signed)
Continue Topiramate 50mg  in AM, 150mg  in PM and Depakote 500mg  twice a day  2. Consider sleep study  3. Refills for sumatriptan sent  4. Follow-up in 6 months, call for any changes   Seizure precautions: 1. If medication has been prescribed for you to prevent seizures, take it exactly as directed.  Do not stop taking the medicine without talking to your doctor first, even if you have not had a seizure in a long time.   2. Avoid activities in which a seizure would cause danger to yourself or to others.  Don't operate dangerous machinery, swim alone, or climb in high or dangerous places, such as on ladders, roofs, or girders.  Do not drive unless your doctor says you may.  3. If you have any warning that you may have a seizure, lay down in a safe place where you can't hurt yourself.    4.  No driving for 6 months from last seizure, as per [facility].   Please refer to the following link on the [place] website for more information: http://[email or web address].org/[email or web address]/driving/drivingu.cfm   5.  Maintain good sleep hygiene. Avoid alcohol.  6.  Contact your doctor if you have any problems that may be related to the medicine you are taking.  7.  Call 911 and bring the patient back to the ED if:        A.  The seizure lasts longer than 5 minutes.       B.  The patient doesn't awaken shortly after the seizure  C.  The patient has new problems such as difficulty seeing, speaking or moving  D.  The patient was injured during the seizure  E.  The patient has a temperature over 102 F (39C)  F.  The patient vomited and now is having trouble breathing

## 2021-10-17 ENCOUNTER — Other Ambulatory Visit: Payer: Self-pay

## 2021-10-24 ENCOUNTER — Other Ambulatory Visit: Payer: Self-pay

## 2021-10-26 ENCOUNTER — Other Ambulatory Visit: Payer: Self-pay

## 2021-11-01 ENCOUNTER — Other Ambulatory Visit: Payer: Self-pay

## 2021-11-07 ENCOUNTER — Other Ambulatory Visit: Payer: Self-pay

## 2021-11-08 ENCOUNTER — Other Ambulatory Visit: Payer: Self-pay

## 2021-11-22 ENCOUNTER — Other Ambulatory Visit: Payer: Self-pay

## 2021-11-24 ENCOUNTER — Other Ambulatory Visit: Payer: Self-pay

## 2021-11-25 ENCOUNTER — Other Ambulatory Visit: Payer: Self-pay

## 2021-11-29 ENCOUNTER — Other Ambulatory Visit: Payer: Self-pay

## 2021-11-30 ENCOUNTER — Other Ambulatory Visit (HOSPITAL_COMMUNITY): Payer: Self-pay

## 2021-12-05 ENCOUNTER — Other Ambulatory Visit (HOSPITAL_COMMUNITY): Payer: Self-pay

## 2021-12-05 ENCOUNTER — Other Ambulatory Visit: Payer: Self-pay

## 2021-12-06 ENCOUNTER — Other Ambulatory Visit: Payer: Self-pay

## 2021-12-14 ENCOUNTER — Other Ambulatory Visit: Payer: Self-pay

## 2021-12-15 ENCOUNTER — Other Ambulatory Visit: Payer: Self-pay

## 2021-12-19 ENCOUNTER — Other Ambulatory Visit: Payer: Self-pay

## 2021-12-20 ENCOUNTER — Other Ambulatory Visit: Payer: Self-pay

## 2021-12-21 ENCOUNTER — Other Ambulatory Visit: Payer: Self-pay

## 2021-12-26 ENCOUNTER — Ambulatory Visit: Payer: Self-pay | Admitting: Internal Medicine

## 2021-12-30 ENCOUNTER — Other Ambulatory Visit: Payer: Self-pay

## 2022-01-05 ENCOUNTER — Other Ambulatory Visit: Payer: Self-pay

## 2022-01-06 ENCOUNTER — Other Ambulatory Visit: Payer: Self-pay

## 2022-01-12 ENCOUNTER — Other Ambulatory Visit: Payer: Self-pay

## 2022-01-12 ENCOUNTER — Ambulatory Visit: Payer: Medicaid Other | Admitting: Internal Medicine

## 2022-01-12 ENCOUNTER — Ambulatory Visit: Payer: Self-pay | Admitting: *Deleted

## 2022-01-12 NOTE — Telephone Encounter (Signed)
° ° ° ° ° °  Chief Complaint: "Thyroid swelling" Symptoms: CAn see protrusion. Frequency:  "Maybe a year ago, said benign, bigger." Pertinent Negatives: Patient denies  Disposition: [] ED /[] Urgent Care (no appt availability in office) / [x] Appointment(In office/virtual)/ []  [facility]/ [] Home Care/ [] Refused Recommended Disposition /[] [facility]/ []  Follow-up with PCP Additional Notes: Pt is evasive historian. Appt made for today with Dr. Wynetta Emery   Reason for Disposition  [1] Small swelling or lump AND [2] unexplained AND [3] present > 1 week  Answer Assessment - Initial Assessment Questions 1. APPEARANCE of SWELLING: "What does it look like?" (e.g., lymph node, insect bite, mole)     Thyroid getting bigger 2. SIZE: "How large is the swelling?" (e.g., inches, cm; or compare to size of pinhead, tip of pen, eraser, coin, pea, grape, ping pong ball)      "Pretty big" 3. LOCATION: "Where is the swelling located?"     "At [place] 4. ONSET: "When did the swelling start?"      5. PAIN: "Is it painful?" If Yes, ask: "How much?"     *No Answer* 6. ITCH: "Does it itch?" If Yes, ask: "How much?"     *No Answer* 7. CAUSE: "What do you think caused the swelling?"     "Tumor but said benign years ago.' 8. OTHER SYMPTOMS: "Do you have any other symptoms?" (e.g., fever)     None  Protocols used: Skin Lump or Localized Swelling-A-AH

## 2022-01-12 NOTE — Telephone Encounter (Signed)
FYI pt has an appt with pcp today 2/16 at 350pm

## 2022-01-17 ENCOUNTER — Other Ambulatory Visit: Payer: Self-pay

## 2022-01-17 ENCOUNTER — Ambulatory Visit: Payer: Medicaid Other | Admitting: Internal Medicine

## 2022-01-17 ENCOUNTER — Ambulatory Visit: Payer: 59 | Attending: Internal Medicine | Admitting: Internal Medicine

## 2022-01-17 ENCOUNTER — Encounter: Payer: Self-pay | Admitting: Internal Medicine

## 2022-01-17 VITALS — BP 115/80 | HR 74 | Resp 16 | Wt 127.2 lb

## 2022-01-17 DIAGNOSIS — Z1231 Encounter for screening mammogram for malignant neoplasm of breast: Secondary | ICD-10-CM

## 2022-01-17 DIAGNOSIS — E049 Nontoxic goiter, unspecified: Secondary | ICD-10-CM

## 2022-01-17 DIAGNOSIS — Z1211 Encounter for screening for malignant neoplasm of colon: Secondary | ICD-10-CM

## 2022-01-17 NOTE — Progress Notes (Signed)
Patient ID: Tammy [name], female    DOB: May 06, 1966  MRN: [id number]  CC: f/u thyroid  Subjective: [name] is a 56 y.o. 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  She has history of goiter with prominent nodules in the isthmus and left thyroid gland.  Biopsies of these nodules done 11/2019 revealed findings consistent with benign follicular nodules.   She has concern that thyroid has increased in size but not sure over what time.  Seen by physician assistant 07/2021.  Thyroid ultrasound ordered but she never had it done due to lack of insurance.  She now has insurance. No problems swallowing No SOB when she lays down at nights.  Sleeps on 2 pillows.   Last thyroid US was 2020.    HM: She is overdue for several health maintenance items including Pap, colon cancer screening and mammography. Patient Active Problem List   Diagnosis Date Noted   COVID-19 virus infection 12/05/2020   Acute superficial venous thrombosis of lower extremity, left 12/13/2018   Essential hypertension 12/13/2018   Chronic migraine 09/07/2016   Seizure ([place]) 08/20/2016   Asthma 08/20/2016   DVT, HX OF 01/20/2008   FIBROIDS, UTERUS 01/16/2008     Current Outpatient Medications on File Prior to Visit  Medication Sig Dispense Refill   acetaminophen (TYLENOL) 500 MG tablet Take 500 mg by mouth every 6 (six) hours as needed.     amLODipine (NORVASC) 5 MG tablet TAKE 1 TABLET (5 MG TOTAL) BY MOUTH DAILY. 90 tablet 1   divalproex (DEPAKOTE) 500 MG DR tablet TAKE 1 TABLET (500 MG TOTAL) BY MOUTH 2 (TWO) TIMES DAILY. 60 tablet 11   SUMAtriptan (IMITREX) 50 MG tablet Take 1 tablet at onset of migraine. May repeat in 2 hours if  headache persists or recurs. Do not take more than 3 a week 10 tablet 11   topiramate (TOPAMAX) 50 MG tablet Take 1 tablet by mouth in the morning and  3 tablets in evening 120 tablet 11   Vitamin D, Ergocalciferol, (DRISDOL) 1.25 MG (50000 UNIT) CAPS capsule Take 1 capsule (50,000 Units total) by mouth every 7 (seven) days. (Patient not taking: Reported on 10/05/2021) 16 capsule 0   [DISCONTINUED] lisinopril-hydrochlorothiazide (ZESTORETIC) 10-12.5 MG tablet Take 2 tablets by mouth daily. To lower blood pressure (Patient not taking: Reported on 12/05/2020) 60 tablet 2   No current facility-administered medications on file prior to visit.    Allergies  Allergen Reactions   Dilantin [Phenytoin] Swelling   Aspirin Other (See Comments)    Patient unsure of reaction    Tramadol Itching and Other (See Comments)    Pt states she ins unsure of reaction Pt states she ins unsure of reaction Pt states she ins unsure of reaction Pt states she ins unsure of reaction    Social History   Socioeconomic History   Marital status: Single    Spouse name: Not on file   Number of children: Not on file   Years of education: Not on file   Highest education level: Not on file  Occupational History   Not on file  Tobacco Use   Smoking status: Never   Smokeless tobacco: Never  Vaping Use   Vaping Use: Never used  Substance and Sexual Activity  Alcohol use: No   Drug use: Yes    Types: Marijuana   Sexual activity: Not on file  Other Topics Concern   Not on file  Social History Narrative   Right handed    Lives with family    Social Determinants of Health   Financial Resource Strain: Not on file  Food Insecurity: Not on file  Transportation Needs: Not on file  Physical Activity: Not on file  Stress: Not on file  Social Connections: Not on file  Intimate Partner Violence: Not on file    Family History  Problem Relation Age of Onset   Heart disease Mother    Migraines Mother    Heart  attack Father     Past Surgical History:  Procedure Laterality Date   HERNIA REPAIR     UTERINE FIBROID SURGERY      ROS: Review of Systems Negative except as stated above  PHYSICAL EXAM: BP 115/80    Pulse 74    Resp 16    Wt 127 lb 3.2 oz (57.7 kg)    LMP 03/08/2016    SpO2 99%    BMI 22.89 kg/m    Physical Exam  General appearance - alert, well appearing, and in no distress Mental status - normal mood, behavior, speech, dress, motor activity, and thought processes Neck -diffuse enlarged thyroid gland with prominent nodule seen and felt above the sternal notch.   CMP Latest Ref Rng & Units 08/24/2021 05/04/2021 12/22/2020  Glucose 70 - 99 mg/dL 81 109(H) 87  BUN 6 - 24 mg/dL 20 15 18   Creatinine 0.57 - 1.00 mg/dL 0.87 0.75 0.74  Sodium 134 - 144 mmol/L 144 137 142  Potassium 3.5 - 5.2 mmol/L 4.1 3.6 4.9  Chloride 96 - 106 mmol/L 108(H) 103 100  CO2 20 - 29 mmol/L 20 24 30(H)  Calcium 8.7 - 10.2 mg/dL 9.4 9.6 9.8  Total Protein 6.0 - 8.5 g/dL 6.6 - 7.4  Total Bilirubin 0.0 - 1.2 mg/dL <0.2 - <0.2  Alkaline Phos 44 - 121 IU/L 48 - 53  AST 0 - 40 IU/L 18 - 14  ALT 0 - 32 IU/L 6 - 6   Lipid Panel  No results found for: CHOL, TRIG, HDL, CHOLHDL, VLDL, LDLCALC, LDLDIRECT  CBC    Component Value Date/Time   WBC 8.1 05/04/2021 1030   RBC 4.63 05/04/2021 1030   HGB 13.2 05/04/2021 1030   HGB 13.6 12/22/2020 1629   HCT 42.2 05/04/2021 1030   HCT 41.4 12/22/2020 1629   PLT 262 05/04/2021 1030   PLT 245 12/22/2020 1629   MCV 91.1 05/04/2021 1030   MCV 88 12/22/2020 1629   MCH 28.5 05/04/2021 1030   MCHC 31.3 05/04/2021 1030   RDW 13.2 05/04/2021 1030   RDW 12.2 12/22/2020 1629   LYMPHSABS 1.0 05/04/2021 1030   LYMPHSABS 2.5 12/22/2020 1629   MONOABS 0.2 05/04/2021 1030   EOSABS 0.0 05/04/2021 1030   EOSABS 0.1 12/22/2020 1629   BASOSABS 0.0 05/04/2021 1030   BASOSABS 0.0 12/22/2020 1629   Lab Results  Component Value Date   TSH 0.936 08/24/2021    ASSESSMENT  AND PLAN: 1. Goiter Patient without compressive symptoms at this time.  However she is overdue for follow-up ultrasound of the thyroid to evaluate for any change in size of the nodules. - US THYROID; Future - Ambulatory referral to Endocrinology - TSH+T4F+T3Free  2. Encounter for screening mammogram for malignant neoplasm of breast - MM Digital Screening;  Future  3. Screening for colon cancer Colon cancer screening discussed including methods of screening.  Patient prefers to have colonoscopy. - Ambulatory referral to Gastroenterology    Patient was given the opportunity to ask questions.  Patient verbalized understanding of the plan and was able to repeat key elements of the plan.   Orders Placed This Encounter  Procedures   US THYROID   MM Digital Screening   TSH+T4F+T3Free   Ambulatory referral to Endocrinology   Ambulatory referral to Gastroenterology     Requested Prescriptions    No prescriptions requested or ordered in this encounter    Return in about 3 weeks (around 02/07/2022) for f/u in 4 wks for physical.  Pt has insurance.  Please get card and update in system.  Karle Plumber, MD, FACP

## 2022-01-18 ENCOUNTER — Other Ambulatory Visit: Payer: Self-pay

## 2022-01-18 LAB — TSH+T4F+T3FREE
Free T4: 1.22 ng/dL (ref 0.82–1.77)
T3, Free: 2 pg/mL (ref 2.0–4.4)
TSH: 1.67 u[IU]/mL (ref 0.450–4.500)

## 2022-01-24 ENCOUNTER — Ambulatory Visit (HOSPITAL_BASED_OUTPATIENT_CLINIC_OR_DEPARTMENT_OTHER): Admission: RE | Admit: 2022-01-24 | Payer: 59 | Source: Ambulatory Visit

## 2022-01-24 ENCOUNTER — Other Ambulatory Visit: Payer: Self-pay

## 2022-01-25 ENCOUNTER — Other Ambulatory Visit: Payer: Self-pay

## 2022-01-25 ENCOUNTER — Ambulatory Visit (HOSPITAL_BASED_OUTPATIENT_CLINIC_OR_DEPARTMENT_OTHER)
Admission: RE | Admit: 2022-01-25 | Discharge: 2022-01-25 | Disposition: A | Discharge status: Home / Self Care | Payer: 59 | Source: Ambulatory Visit | Attending: Internal Medicine | Admitting: Internal Medicine

## 2022-01-25 DIAGNOSIS — E049 Nontoxic goiter, unspecified: Secondary | ICD-10-CM | POA: Diagnosis not present

## 2022-01-26 ENCOUNTER — Other Ambulatory Visit: Payer: Self-pay

## 2022-01-27 ENCOUNTER — Other Ambulatory Visit: Payer: Self-pay

## 2022-02-02 ENCOUNTER — Telehealth: Payer: Self-pay | Admitting: *Deleted

## 2022-02-02 ENCOUNTER — Ambulatory Visit (AMBULATORY_SURGERY_CENTER): Payer: Self-pay | Admitting: *Deleted

## 2022-02-02 ENCOUNTER — Other Ambulatory Visit: Payer: Self-pay

## 2022-02-02 VITALS — Ht 63.0 in | Wt 130.0 lb

## 2022-02-02 DIAGNOSIS — Z1211 Encounter for screening for malignant neoplasm of colon: Secondary | ICD-10-CM

## 2022-02-02 MED ORDER — NA SULFATE-K SULFATE-MG SULF 17.5-3.13-1.6 GM/177ML PO SOLN
2.0000 | Freq: Once | ORAL | 0 refills | Status: AC
  Filled 2022-02-02: qty 354, 1d supply, fill #0

## 2022-02-02 NOTE — Telephone Encounter (Signed)
Dr Rober Minion pt is scheduled for a direct colon on 3/23.  She has a history of epilepsy and takes Depakote daily. During PV pt stated "I did have a seizure back in January when that overhead comet came through but none since." Says that she's very good about taking her daily seizure meds. OK to proceed with colon as scheduled? ?

## 2022-02-02 NOTE — Progress Notes (Signed)
No egg or soy allergy known to patient  ?No issues known to pt with past sedation with any surgeries or procedures ?Patient denies ever being told they had issues or difficulty with intubation  ?No FH of Malignant Hyperthermia ?Pt is not on diet pills ?Pt is not on  home 02  ?Pt is not on blood thinners  ?Pt denies issues with constipation  ?No A fib or A flutter ? ?Pt stated she did have a seizure in January "when that overhead comet came through" Sent note to physician and J. Nulty to see if ok to proceed with procedure. ? ?Due to the COVID-19 pandemic we are asking patients to follow certain guidelines in PV and the [place]   ?Pt aware of COVID protocols and [facility] guidelines  ? ?PV completed over the phone. Pt verified name, DOB, address and insurance during PV today.  ?Pt mailed instruction packet with copy of consent form to read and not return, and instructions.  ?Pt encouraged to call with questions or issues.  ? ?

## 2022-02-03 ENCOUNTER — Other Ambulatory Visit: Payer: Self-pay

## 2022-02-16 ENCOUNTER — Ambulatory Visit (AMBULATORY_SURGERY_CENTER): Payer: 59 | Admitting: Gastroenterology

## 2022-02-16 ENCOUNTER — Other Ambulatory Visit: Payer: Self-pay

## 2022-02-16 ENCOUNTER — Encounter: Payer: Self-pay | Admitting: Gastroenterology

## 2022-02-16 VITALS — BP 149/87 | HR 59 | Temp 97.8°F | Resp 11 | Ht 63.0 in | Wt 130.0 lb

## 2022-02-16 DIAGNOSIS — Z1211 Encounter for screening for malignant neoplasm of colon: Secondary | ICD-10-CM | POA: Diagnosis not present

## 2022-02-16 DIAGNOSIS — D125 Benign neoplasm of sigmoid colon: Secondary | ICD-10-CM

## 2022-02-16 DIAGNOSIS — K6289 Other specified diseases of anus and rectum: Secondary | ICD-10-CM

## 2022-02-16 DIAGNOSIS — K635 Polyp of colon: Secondary | ICD-10-CM | POA: Diagnosis not present

## 2022-02-16 DIAGNOSIS — K640 First degree hemorrhoids: Secondary | ICD-10-CM

## 2022-02-16 MED ORDER — SODIUM CHLORIDE 0.9 % IV SOLN: 500.0000 mL | Freq: Once | INTRAVENOUS | Status: DC

## 2022-02-16 NOTE — Progress Notes (Signed)
? ?GASTROENTEROLOGY PROCEDURE H&P NOTE  ? ?Primary Care Physician: ?Ladell Pier, MD ? ? ? ?Reason for Procedure:  Colon Cancer screening ? ?Plan:    Colonoscopy ? ?Patient is appropriate for endoscopic procedure(s) in the ambulatory ([facility]) setting. ? ?The nature of the procedure, as well as the risks, benefits, and alternatives were carefully and thoroughly reviewed with the patient. Ample time for discussion and questions allowed. The patient understood, was satisfied, and agreed to proceed.  ? ? ? ?HPI: ?Tammy [name] is a 56 y.o. female who presents for colonoscopy for routine Colon Cancer screening.  No active GI symptoms.  No known family history of colon cancer or related malignancy.  Patient is otherwise without complaints or active issues today. ? ?Past Medical History:  ?Diagnosis Date  ? Asthma   ? Blood transfusion without reported diagnosis   ? 'a long time ago"  ? GERD (gastroesophageal reflux disease)   ? Headache(784.0)   ? Hypertension   ? Seizures ([place])   ? ? ?Past Surgical History:  ?Procedure Laterality Date  ? HERNIA REPAIR    ? UTERINE FIBROID SURGERY    ? ? ?Prior to Admission medications   ?Medication Sig Start Date End Date Taking? Authorizing Provider  ?amLODipine (NORVASC) 5 MG tablet TAKE 1 TABLET (5 MG TOTAL) BY MOUTH DAILY. 08/24/21 08/24/22 Yes McClung, Dionne Bucy, PA-C  ?divalproex (DEPAKOTE) 500 MG DR tablet TAKE 1 TABLET (500 MG TOTAL) BY MOUTH 2 (TWO) TIMES DAILY. 10/05/21 10/05/22 Yes Cameron Sprang, MD  ?topiramate (TOPAMAX) 50 MG tablet Take 1 tablet by mouth in the morning and  3 tablets in evening 10/05/21  Yes Cameron Sprang, MD  ?acetaminophen (TYLENOL) 500 MG tablet Take 500 mg by mouth every 6 (six) hours as needed.    [provider]  ?SUMAtriptan (IMITREX) 50 MG tablet Take 1 tablet at onset of migraine. May repeat in 2 hours if headache persists or recurs. Do not take more than 3 a week 10/05/21   Cameron Sprang, MD  ?Vitamin D, Ergocalciferol, (DRISDOL)  1.25 MG (50000 UNIT) CAPS capsule Take 1 capsule (50,000 Units total) by mouth every 7 (seven) days. 08/25/21   Argentina Donovan, PA-C  ?lisinopril-hydrochlorothiazide (ZESTORETIC) 10-12.5 MG tablet Take 2 tablets by mouth daily. To lower blood pressure ?Patient not taking: Reported on 12/05/2020 08/12/20 12/08/20  Camillia Herter, NP  ? ? ?Current Outpatient Medications  ?Medication Sig Dispense Refill  ? amLODipine (NORVASC) 5 MG tablet TAKE 1 TABLET (5 MG TOTAL) BY MOUTH DAILY. 90 tablet 1  ? divalproex (DEPAKOTE) 500 MG DR tablet TAKE 1 TABLET (500 MG TOTAL) BY MOUTH 2 (TWO) TIMES DAILY. 60 tablet 11  ? topiramate (TOPAMAX) 50 MG tablet Take 1 tablet by mouth in the morning and  3 tablets in evening 120 tablet 11  ? acetaminophen (TYLENOL) 500 MG tablet Take 500 mg by mouth every 6 (six) hours as needed.    ? SUMAtriptan (IMITREX) 50 MG tablet Take 1 tablet at onset of migraine. May repeat in 2 hours if headache persists or recurs. Do not take more than 3 a week 10 tablet 11  ? Vitamin D, Ergocalciferol, (DRISDOL) 1.25 MG (50000 UNIT) CAPS capsule Take 1 capsule (50,000 Units total) by mouth every 7 (seven) days. 16 capsule 0  ? ?Current Facility-Administered Medications  ?Medication Dose Route Frequency Provider Last Rate Last Admin  ? 0.9 %  sodium chloride infusion  500 mL Intravenous Once Lyrica Mcclarty V,  DO      ? ? ?Allergies as of 02/16/2022 - Review Complete 02/16/2022  ?Allergen Reaction Noted  ? Dilantin [phenytoin] Swelling 09/07/2016  ? Aspirin Other (See Comments) 07/13/2014  ? Tramadol Itching and Other (See Comments) 01/26/2016  ? ? ?Family History  ?Problem Relation Age of Onset  ? Heart disease Mother   ? Migraines Mother   ? Heart attack Father   ? Colon cancer Neg Hx   ? ? ?Social History  ? ?Socioeconomic History  ? Marital status: Single  ?  Spouse name: Not on file  ? Number of children: Not on file  ? Years of education: Not on file  ? Highest education level: Not on file  ?Occupational  History  ? Not on file  ?Tobacco Use  ? Smoking status: Never  ? Smokeless tobacco: Never  ?Vaping Use  ? Vaping Use: Never used  ?Substance and Sexual Activity  ? Alcohol use: No  ? Drug use: Yes  ?  Types: Marijuana  ? Sexual activity: Not on file  ?Other Topics Concern  ? Not on file  ?Social History Narrative  ? Right handed   ? Lives with family   ? ?Social Determinants of Health  ? ?Financial Resource Strain: Not on file  ?Food Insecurity: Not on file  ?Transportation Needs: Not on file  ?Physical Activity: Not on file  ?Stress: Not on file  ?Social Connections: Not on file  ?Intimate Partner Violence: Not on file  ? ? ?Physical Exam: ?Vital signs in last 24 hours: ?'@BP'$  107/68   Pulse 62   Temp 97.8 ?F (36.6 ?C) (Temporal)   Ht '5\' 3"'$  (1.6 m)   Wt 130 lb (59 kg)   LMP 03/08/2016   SpO2 100%   BMI 23.03 kg/m?  ?GEN: NAD ?EYE: Sclerae anicteric ?ENT: MMM ?CV: Non-tachycardic ?Pulm: CTA b/l ?GI: Soft, NT/ND ?NEURO:  Alert & Oriented x 3 ? ? ?Gerrit Heck, DO ?[facility] ? ? ?02/16/2022 11:35 AM ? ?

## 2022-02-16 NOTE — Progress Notes (Signed)
Called to room to assist during endoscopic procedure.  Patient ID and intended procedure confirmed with present staff. Received instructions for my participation in the procedure from the performing physician.  

## 2022-02-16 NOTE — Patient Instructions (Signed)
YOU HAD AN ENDOSCOPIC PROCEDURE TODAY AT [facility]:   Refer to the procedure report that was given to you for any specific questions about what was found during the examination.  If the procedure report does not answer your questions, please call your gastroenterologist to clarify.  If you requested that your care partner not be given the details of your procedure findings, then the procedure report has been included in a sealed envelope for you to review at your convenience later. ? ?**Handouts given polyps and hemorrhoids** ? ?YOU SHOULD EXPECT: Some feelings of bloating in the abdomen. Passage of more gas than usual.  Walking can help get rid of the air that was put into your GI tract during the procedure and reduce the bloating. If you had a lower endoscopy (such as a colonoscopy or flexible sigmoidoscopy) you may notice spotting of blood in your stool or on the toilet paper. If you underwent a bowel prep for your procedure, you may not have a normal bowel movement for a few days. ? ?Please Note:  You might notice some irritation and congestion in your nose or some drainage.  This is from the oxygen used during your procedure.  There is no need for concern and it should clear up in a day or so. ? ?SYMPTOMS TO REPORT IMMEDIATELY: ? ?Following lower endoscopy (colonoscopy or flexible sigmoidoscopy): ? Excessive amounts of blood in the stool ? Significant tenderness or worsening of abdominal pains ? Swelling of the abdomen that is new, acute ? Fever of 100?F or higher ? ? ?For urgent or emergent issues, a gastroenterologist can be reached at any hour by calling [phone]. ?Do not use MyChart messaging for urgent concerns.  ? ? ?DIET:  We do recommend a small meal at first, but then you may proceed to your regular diet.  Drink plenty of fluids but you should avoid alcoholic beverages for 24 hours. ? ?ACTIVITY:  You should plan to take it easy for the rest of today and you should NOT DRIVE or  use heavy machinery until tomorrow (because of the sedation medicines used during the test).   ? ?FOLLOW UP: ?Our staff will call the number listed on your records 48-72 hours following your procedure to check on you and address any questions or concerns that you may have regarding the information given to you following your procedure. If we do not reach you, we will leave a message.  We will attempt to reach you two times.  During this call, we will ask if you have developed any symptoms of COVID 19. If you develop any symptoms (ie: fever, flu-like symptoms, shortness of breath, cough etc.) before then, please call [phone].  If you test positive for Covid 19 in the 2 weeks post procedure, please call and report this information to [place].   ? ?If any biopsies were taken you will be contacted by phone or by letter within the next 1-3 weeks.  Please call us at [phone] if you have not heard about the biopsies in 3 weeks.  ? ? ?SIGNATURES/CONFIDENTIALITY: ?You and/or your care partner have signed paperwork which will be entered into your electronic medical record.  These signatures attest to the fact that that the information above on your After Visit Summary has been reviewed and is understood.  Full responsibility of the confidentiality of this discharge information lies with you and/or your care-partner.  ?

## 2022-02-16 NOTE — Op Note (Signed)
[facility] ?Patient Name: [place] ?Procedure Date: 02/16/2022 11:36 AM ?MRN: [id number] ?Endoscopist: Gerrit Heck , MD ?Age: 56 ?Referring MD:  ?Date of Birth: 1966/01/04 ?Gender: Female ?Account #: [id number] ?Procedure:                Colonoscopy ?Indications:              Screening for colorectal malignant neoplasm, This  ?                          is the patient's first colonoscopy ?Medicines:                Monitored Anesthesia Care ?Procedure:                Pre-Anesthesia Assessment: ?                          - Prior to the procedure, a History and Physical  ?                          was performed, and patient medications and  ?                          allergies were reviewed. The patient's tolerance of  ?                          previous anesthesia was also reviewed. The risks  ?                          and benefits of the procedure and the sedation  ?                          options and risks were discussed with the patient.  ?                          All questions were answered, and informed consent  ?                          was obtained. Prior Anticoagulants: The patient has  ?                          taken no previous anticoagulant or antiplatelet  ?                          agents. ASA Grade Assessment: II - A patient with  ?                          mild systemic disease. After reviewing the risks  ?                          and benefits, the patient was deemed in  ?                          satisfactory condition to undergo the procedure. ?  After obtaining informed consent, the colonoscope  ?                          was passed under direct vision. Throughout the  ?                          procedure, the patient's blood pressure, pulse, and  ?                          oxygen saturations were monitored continuously. The  ?                          Olympus #[id number] Colonoscope was introduced through  ?                          the anus and  advanced to the the cecum, identified  ?                          by appendiceal orifice and ileocecal valve. The  ?                          colonoscopy was performed without difficulty. The  ?                          patient tolerated the procedure well. The quality  ?                          of the bowel preparation was good. The ileocecal  ?                          valve, appendiceal orifice, and rectum were  ?                          photographed. ?Scope In: 11:45:00 AM ?Scope Out: 12:00:33 PM ?Scope Withdrawal Time: 0 hours 10 minutes 55 seconds  ?Total Procedure Duration: 0 hours 15 minutes 33 seconds  ?Findings:                 The perianal and digital rectal examinations were  ?                          normal. ?                          Two sessile polyps were found in the sigmoid colon.  ?                          The polyps were 3 to 6 mm in size. These polyps  ?                          were removed with a cold snare. Resection and  ?                          retrieval were complete. Estimated blood loss was  ?  minimal. ?                          Non-bleeding internal hemorrhoids and hypertrophied  ?                          anal papillae were found during retroflexion. The  ?                          hemorrhoids were small. ?Complications:            No immediate complications. ?Estimated Blood Loss:     Estimated blood loss was minimal. ?Impression:               - Two 3 to 6 mm polyps in the sigmoid colon,  ?                          removed with a cold snare. Resected and retrieved. ?                          - Non-bleeding internal hemorrhoids and  ?                          hypertrophied anal papillae. ?Recommendation:           - Patient has a contact number available for  ?                          emergencies. The signs and symptoms of potential  ?                          delayed complications were discussed with the  ?                          patient. Return to  normal activities tomorrow.  ?                          Written discharge instructions were provided to the  ?                          patient. ?                          - Resume previous diet. ?                          - Continue present medications. ?                          - Await pathology results. ?                          - Repeat colonoscopy for surveillance based on  ?                          pathology results. ?                          -  Return to GI clinic PRN. ?Gerrit Heck, MD ?02/16/2022 12:05:44 PM ?

## 2022-02-16 NOTE — Progress Notes (Signed)
Pt non-responsive, VVS, Report to RN  °

## 2022-02-16 NOTE — Progress Notes (Signed)
VS completed by [place].   Pt's states no medical or surgical changes since previsit or office visit.  

## 2022-02-20 ENCOUNTER — Other Ambulatory Visit: Payer: Self-pay

## 2022-02-20 ENCOUNTER — Ambulatory Visit: Payer: Managed Care, Other (non HMO) | Attending: Internal Medicine | Admitting: Internal Medicine

## 2022-02-20 ENCOUNTER — Telehealth: Payer: Self-pay | Admitting: *Deleted

## 2022-02-20 ENCOUNTER — Other Ambulatory Visit: Payer: Self-pay | Admitting: Internal Medicine

## 2022-02-20 ENCOUNTER — Encounter: Payer: Self-pay | Admitting: Internal Medicine

## 2022-02-20 VITALS — BP 100/65 | HR 100 | Wt 126.8 lb

## 2022-02-20 DIAGNOSIS — E041 Nontoxic single thyroid nodule: Secondary | ICD-10-CM | POA: Diagnosis not present

## 2022-02-20 DIAGNOSIS — I10 Essential (primary) hypertension: Secondary | ICD-10-CM | POA: Diagnosis not present

## 2022-02-20 DIAGNOSIS — Z1231 Encounter for screening mammogram for malignant neoplasm of breast: Secondary | ICD-10-CM

## 2022-02-20 DIAGNOSIS — I83893 Varicose veins of bilateral lower extremities with other complications: Secondary | ICD-10-CM | POA: Diagnosis not present

## 2022-02-20 DIAGNOSIS — R634 Abnormal weight loss: Secondary | ICD-10-CM

## 2022-02-20 DIAGNOSIS — Z2821 Immunization not carried out because of patient refusal: Secondary | ICD-10-CM

## 2022-02-20 DIAGNOSIS — Z114 Encounter for screening for human immunodeficiency virus [HIV]: Secondary | ICD-10-CM

## 2022-02-20 DIAGNOSIS — Z8669 Personal history of other diseases of the nervous system and sense organs: Secondary | ICD-10-CM

## 2022-02-20 NOTE — Telephone Encounter (Signed)
?  Follow up Call- ? ? ?  02/16/2022  ? 11:21 AM  ?Call back number  ?Post procedure Call Back phone  # [phone]  ?Permission to leave phone message No  ?comments Hasn't set it up  ?  ? ?Patient questions: ? ?Do you have a fever, pain , or abdominal swelling? No. ?Pain Score  0 * ? ?Have you tolerated food without any problems? Yes.   ? ?Have you been able to return to your normal activities? Yes.   ? ?Do you have any questions about your discharge instructions: ?Diet   No. ?Medications  No. ?Follow up visit  No. ? ?Do you have questions or concerns about your Care? No. ? ?Actions: ?* If pain score is 4 or above: ?No action needed, pain <4. ? ? ?

## 2022-02-20 NOTE — Progress Notes (Signed)
? ? ?Patient ID: Tammy [name], female    DOB: [birth year]  MRN: [id number] ? ?CC: Medication Refill and Hypertension ? ? ?Subjective: ?Tammy [name] is a 56 y.o. 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 ?Thyromegaly:  had thyroid US since last visit.  Nodules unchanged in sizes ?No compressive symptoms, or voice changes ? ?Concern about unexplained wgh loss ?11/2020 She was 151 lbs.  2 yrs ago she was 176 lbs. in 2020, she got up to 221 pounds ?Reports she started eating healthier around 2021 - more salads and smaller portions. Use to drink a lot of Coke and Pepsi but stopped in 2021.  Now mainly water, grape juice and  decaf coffee without sugar.  ?Does not exercise but plans to start to help tone her muscles especially in the upper arms.  Has a lot of loose skin in the upper arms since losing weight. ?Denies early satiety   ?No chronic cough/SOB ?No fever. Does get hot flashes due to menopause.  No no longer has menses; stopped about 9 yrs ago ?No swollen LN ?Had recent c-scope.  Had a few polyps removed.  Await pathology ?Needs PAP ?Checks breasts regularly. No abn masses felt ?Has MMG scheduled next mth ?Uses marijuana.  No other street drugs ?I note that she is on Topamax through her neurologist for migraines and possibly also for seizure.  Patient thinks she was started on this around 2021 and dose was increased. ? ? ?Hx of Sz:  followed by Dr. Delice [name] ?Last seen 09/2021 ?Taking Depakote.  Had sz 11/2020. ? ?HTN: taking Norvasc and took already for today ?Limits salt in foods ? ?HM: declines flu shot.  Declines Shingrix. ? ? ? ?Patient Active Problem List  ? Diagnosis Date Noted  ? COVID-19 virus infection 12/05/2020  ? Acute superficial venous thrombosis  of lower extremity, left 12/13/2018  ? Essential hypertension 12/13/2018  ? Chronic migraine 09/07/2016  ? Seizure ([place]) 08/20/2016  ? Asthma 08/20/2016  ? DVT, HX OF 01/20/2008  ? FIBROIDS, UTERUS 01/16/2008  ?  ? ?Current Outpatient Medications on File Prior to Visit  ?Medication Sig Dispense Refill  ? acetaminophen (TYLENOL) 500 MG tablet Take 500 mg by mouth every 6 (six) hours as needed.    ? amLODipine (NORVASC) 5 MG tablet TAKE 1 TABLET (5 MG TOTAL) BY MOUTH DAILY. 90 tablet 1  ? divalproex (DEPAKOTE) 500 MG DR tablet TAKE 1 TABLET (500 MG TOTAL) BY MOUTH 2 (TWO) TIMES DAILY. 60 tablet 11  ? SUMAtriptan (IMITREX) 50 MG tablet Take 1 tablet at onset of migraine. May repeat in 2 hours if headache persists or recurs. Do not take more than 3 a week 10 tablet 11  ? topiramate (TOPAMAX) 50 MG tablet Take 1 tablet by mouth in the morning and  3 tablets in evening 120 tablet 11  ? Vitamin D, Ergocalciferol, (DRISDOL) 1.25 MG (50000 UNIT) CAPS capsule Take 1 capsule (50,000 Units total) by mouth every 7 (seven) days. 16 capsule 0  ? [DISCONTINUED] lisinopril-hydrochlorothiazide (ZESTORETIC) 10-12.5 MG tablet Take 2 tablets by mouth daily. To lower blood pressure (Patient not taking: Reported on 12/05/2020) 60 tablet 2  ? ?No current facility-administered medications on file prior to visit.  ? ? ?Allergies  ?Allergen Reactions  ? Dilantin [Phenytoin] Swelling  ? Aspirin  Other (See Comments)  ?  Patient unsure of reaction   ? Tramadol Itching and Other (See Comments)  ?  Pt states she ins unsure of reaction ?Pt states she ins unsure of reaction ?Pt states she ins unsure of reaction ?Pt states she ins unsure of reaction  ? ? ?Social History  ? ?Socioeconomic History  ? Marital status: Single  ?  Spouse name: Not on file  ? Number of children: Not on file  ? Years of education: Not on file  ? Highest education level: Not on file  ?Occupational History  ? Not on file  ?Tobacco Use  ? Smoking status: Never  ? Smokeless  tobacco: Never  ?Vaping Use  ? Vaping Use: Never used  ?Substance and Sexual Activity  ? Alcohol use: No  ? Drug use: Yes  ?  Types: Marijuana  ? Sexual activity: Not on file  ?Other Topics Concern  ? Not on file  ?Social History Narrative  ? Right handed   ? Lives with family   ? ?Social Determinants of Health  ? ?Financial Resource Strain: Not on file  ?Food Insecurity: Not on file  ?Transportation Needs: Not on file  ?Physical Activity: Not on file  ?Stress: Not on file  ?Social Connections: Not on file  ?Intimate Partner Violence: Not on file  ? ? ?Family History  ?Problem Relation Age of Onset  ? Heart disease Mother   ? Migraines Mother   ? Heart attack Father   ? Colon cancer Neg Hx   ? ? ?Past Surgical History:  ?Procedure Laterality Date  ? HERNIA REPAIR    ? UTERINE FIBROID SURGERY    ? ? ?ROS: ?Review of Systems ?Negative except as stated above ? ?PHYSICAL EXAM: ?BP 100/65   Pulse 100   Wt 126 lb 12.8 oz (57.5 kg)   LMP 03/08/2016   SpO2 99%   BMI 22.46 kg/m?   ?Wt Readings from Last 3 Encounters:  ?02/20/22 126 lb 12.8 oz (57.5 kg)  ?02/16/22 130 lb (59 kg)  ?02/02/22 130 lb (59 kg)  ? ? ?Physical Exam ? ?General appearance - alert, well appearing, middle-aged older African-American female and in no distress.  I detected the smell of marijuana. ?Mental status - normal mood, behavior, speech, dress, motor activity, and thought processes ?Eyes -nonicteric sclera.  No proptosis of the eyes. ?Mouth - mucous membranes moist, pharynx normal without lesions ?Neck -thyroid nodule noted above the sternal notch and more to the right.  Appears unchanged.  No cervical lymphadenopathy appreciated. ?Chest - clear to auscultation, no wheezes, rales or rhonchi, symmetric air entry ?Heart - normal rate, regular rhythm, normal S1, S2, no murmurs, rubs, clicks or gallops ?Abdomen - soft, nontender, nondistended, no masses or organomegaly ?Extremities -left lower extremity about 2 times large in circumference than the  right.  Both legs with varicose veins left much greater than the right.  Patient reports the circumference difference has been like that for years.  Has compression socks but did not wear them. ? ? ?  02/20/2022  ?  2:37 PM 01/17/2022  ?  4:18 PM 08/24/2021  ?  2:30 PM  ?Depression screen PHQ 2/9  ?Decreased Interest 0 0 2  ?Down, Depressed, Hopeless 0 0 2  ?PHQ - 2 Score 0 0 4  ?Altered sleeping   3  ?Tired, decreased energy   1  ?Change in appetite   2  ?Feeling bad or failure about yourself    1  ?  Trouble concentrating   0  ?Moving slowly or fidgety/restless   0  ?Suicidal thoughts   0  ?PHQ-9 Score   11  ? ? ?  02/20/2022  ?  2:37 PM 01/17/2022  ?  4:18 PM 08/24/2021  ?  2:31 PM 03/01/2021  ?  5:00 PM  ?GAD 7 : Generalized Anxiety Score  ?Nervous, Anxious, on Edge 0 0 2 3  ?Control/stop worrying 0 0 1 3  ?Worry too much - different things 0 0 1 3  ?Trouble relaxing 0 0 2 2  ?Restless 0 0 0 2  ?Easily annoyed or irritable 0 0 2 2  ?Afraid - awful might happen 0 0 0 3  ?Total GAD 7 Score 0 0 8 18  ? ? ? ? ? ?  Latest Ref Rng & Units 08/24/2021  ?  3:04 PM 05/04/2021  ? 10:30 AM 12/22/2020  ?  4:29 PM  ?CMP  ?Glucose 70 - 99 mg/dL 81   109   87    ?BUN 6 - 24 mg/dL '20   15   18    '$ ?Creatinine 0.57 - 1.00 mg/dL 0.87   0.75   0.74    ?Sodium 134 - 144 mmol/L 144   137   142    ?Potassium 3.5 - 5.2 mmol/L 4.1   3.6   4.9    ?Chloride 96 - 106 mmol/L 108   103   100    ?CO2 20 - 29 mmol/L '20   24   30    '$ ?Calcium 8.7 - 10.2 mg/dL 9.4   9.6   9.8    ?Total Protein 6.0 - 8.5 g/dL 6.6    7.4    ?Total Bilirubin 0.0 - 1.2 mg/dL <0.2    <0.2    ?Alkaline Phos 44 - 121 IU/L 48    53    ?AST 0 - 40 IU/L 18    14    ?ALT 0 - 32 IU/L 6    6    ? ?Lipid Panel  ?No results found for: CHOL, TRIG, HDL, CHOLHDL, VLDL, LDLCALC, LDLDIRECT ? ?CBC ?   ?Component Value Date/Time  ? WBC 8.1 05/04/2021 1030  ? RBC 4.63 05/04/2021 1030  ? HGB 13.2 05/04/2021 1030  ? HGB 13.6 12/22/2020 1629  ? HCT 42.2 05/04/2021 1030  ? HCT 41.4 12/22/2020 1629  ? PLT  262 05/04/2021 1030  ? PLT 245 12/22/2020 1629  ? MCV 91.1 05/04/2021 1030  ? MCV 88 12/22/2020 1629  ? MCH 28.5 05/04/2021 1030  ? MCHC 31.3 05/04/2021 1030  ? RDW 13.2 05/04/2021 1030  ? RDW 12.2 12/22/2020 1

## 2022-02-21 ENCOUNTER — Encounter: Payer: Self-pay | Admitting: Gastroenterology

## 2022-02-21 LAB — CBC
Hematocrit: 37.5 % (ref 34.0–46.6)
Hemoglobin: 12.1 g/dL (ref 11.1–15.9)
MCH: 29.5 pg (ref 26.6–33.0)
MCHC: 32.3 g/dL (ref 31.5–35.7)
MCV: 92 fL (ref 79–97)
Platelets: 192 10*3/uL (ref 150–450)
RBC: 4.1 x10E6/uL (ref 3.77–5.28)
RDW: 12.8 % (ref 11.7–15.4)
WBC: 6.4 10*3/uL (ref 3.4–10.8)

## 2022-02-21 LAB — COMPREHENSIVE METABOLIC PANEL
ALT: 9 IU/L (ref 0–32)
AST: 16 IU/L (ref 0–40)
Albumin/Globulin Ratio: 1.6 (ref 1.2–2.2)
Albumin: 4.5 g/dL (ref 3.8–4.9)
Alkaline Phosphatase: 43 IU/L — ABNORMAL LOW (ref 44–121)
BUN/Creatinine Ratio: 17 (ref 9–23)
BUN: 14 mg/dL (ref 6–24)
Bilirubin Total: 0.3 mg/dL (ref 0.0–1.2)
CO2: 23 mmol/L (ref 20–29)
Calcium: 10.2 mg/dL (ref 8.7–10.2)
Chloride: 103 mmol/L (ref 96–106)
Creatinine, Ser: 0.81 mg/dL (ref 0.57–1.00)
Globulin, Total: 2.8 g/dL (ref 1.5–4.5)
Glucose: 74 mg/dL (ref 70–99)
Potassium: 4.5 mmol/L (ref 3.5–5.2)
Sodium: 140 mmol/L (ref 134–144)
Total Protein: 7.3 g/dL (ref 6.0–8.5)
eGFR: 87 mL/min/{1.73_m2} (ref >59)

## 2022-02-21 LAB — LIPID PANEL
Chol/HDL Ratio: 2.9 ratio (ref 0.0–4.4)
Cholesterol, Total: 239 mg/dL — ABNORMAL HIGH (ref 100–199)
HDL: 83 mg/dL (ref >39)
LDL Chol Calc (NIH): 143 mg/dL — ABNORMAL HIGH (ref 0–99)
Triglycerides: 79 mg/dL (ref 0–149)
VLDL Cholesterol Cal: 13 mg/dL (ref 5–40)

## 2022-02-21 LAB — HIV ANTIBODY (ROUTINE TESTING W REFLEX): HIV Screen 4th Generation wRfx: NONREACTIVE

## 2022-02-21 NOTE — Progress Notes (Signed)
Let patient know that kidney and liver function tests are normal.  Cholesterol elevated at 143 with goal being less than 100.  Continue healthy eating habits.  Try to exercise as discussed on recent visit.  Blood cell counts are normal.  Screening test for HIV is negative.

## 2022-02-27 ENCOUNTER — Telehealth: Payer: Self-pay

## 2022-02-27 NOTE — Telephone Encounter (Signed)
Contacted pt to go over lab results pt is aware and doesn't have any questions or concerns 

## 2022-03-02 ENCOUNTER — Ambulatory Visit: Payer: Managed Care, Other (non HMO)

## 2022-03-07 ENCOUNTER — Other Ambulatory Visit: Payer: Self-pay

## 2022-03-07 ENCOUNTER — Other Ambulatory Visit: Payer: Self-pay | Admitting: Physician Assistant

## 2022-03-07 DIAGNOSIS — I10 Essential (primary) hypertension: Secondary | ICD-10-CM

## 2022-03-08 ENCOUNTER — Other Ambulatory Visit: Payer: Self-pay

## 2022-03-08 MED ORDER — AMLODIPINE BESYLATE 5 MG PO TABS
ORAL_TABLET | Freq: Every day | ORAL | 1 refills | Status: DC
  Filled 2022-03-08: qty 90, 90d supply, fill #0

## 2022-03-08 NOTE — Telephone Encounter (Signed)
Requested Prescriptions  ?Pending Prescriptions Disp Refills  ?? amLODipine (NORVASC) 5 MG tablet 90 tablet 1  ?  Sig: TAKE 1 TABLET (5 MG TOTAL) BY MOUTH DAILY.  ?  ? Cardiovascular: Calcium Channel Blockers 2 Passed - 03/07/2022 11:47 AM  ?  ?  Passed - Last BP in normal range  ?  BP Readings from Last 1 Encounters:  ?02/20/22 100/65  ?   ?  ?  Passed - Last Heart Rate in normal range  ?  Pulse Readings from Last 1 Encounters:  ?02/20/22 100  ?   ?  ?  Passed - Valid encounter within last 6 months  ?  Recent Outpatient Visits   ?      ? 2 weeks ago Essential hypertension  ? [place] Ladell Pier, MD  ? 1 month ago Goiter  ? [place] Ladell Pier, MD  ? 6 months ago Vaginal discharge  ? [place], [place]  ? 7 months ago   ? [place], [place]  ? 8 months ago Tremor  ? [place], [place], [place]  ?  ?  ?Future Appointments   ?        ? In 1 month Ladell Pier, MD [place]  ?  ? ?  ?  ?  ? ? ?

## 2022-03-09 ENCOUNTER — Other Ambulatory Visit: Payer: Self-pay

## 2022-03-09 ENCOUNTER — Ambulatory Visit: Payer: 59

## 2022-03-31 ENCOUNTER — Other Ambulatory Visit: Payer: Self-pay

## 2022-04-03 ENCOUNTER — Other Ambulatory Visit: Payer: Self-pay

## 2022-04-10 ENCOUNTER — Encounter: Payer: Managed Care, Other (non HMO) | Admitting: Internal Medicine

## 2022-05-15 ENCOUNTER — Other Ambulatory Visit: Payer: Self-pay

## 2022-05-18 ENCOUNTER — Other Ambulatory Visit: Payer: Self-pay

## 2022-06-15 ENCOUNTER — Ambulatory Visit: Payer: 59 | Admitting: Neurology

## 2022-06-15 ENCOUNTER — Encounter: Payer: Self-pay | Admitting: Neurology

## 2022-06-15 DIAGNOSIS — Z029 Encounter for administrative examinations, unspecified: Secondary | ICD-10-CM

## 2022-07-26 ENCOUNTER — Other Ambulatory Visit: Payer: Self-pay

## 2022-08-15 ENCOUNTER — Other Ambulatory Visit: Payer: Self-pay | Admitting: Internal Medicine

## 2022-08-15 DIAGNOSIS — I10 Essential (primary) hypertension: Secondary | ICD-10-CM

## 2022-08-15 NOTE — Telephone Encounter (Signed)
Refilled 03/08/2022 #90 1rf. Requested Prescriptions  Pending Prescriptions Disp Refills  . amLODipine (NORVASC) 5 MG tablet [Pharmacy Med Name: AMLODIPINE BESYLATE '5MG'$  TABLETS] 90 tablet 1    Sig: TAKE 1 TABLET BY MOUTH EVERY DAY     Cardiovascular: Calcium Channel Blockers 2 Passed - 08/15/2022  6:46 AM      Passed - Last BP in normal range    BP Readings from Last 1 Encounters:  02/20/22 100/65         Passed - Last Heart Rate in normal range    Pulse Readings from Last 1 Encounters:  02/20/22 100         Passed - Valid encounter within last 6 months    Recent Outpatient Visits          5 months ago Essential hypertension   [place], [place]   7 months ago [place], [place]   11 months ago Vaginal discharge   [place], Dionne Bucy, [place]   1 year ago    [place], [place], [place]   1 year ago [place], [place]

## 2022-08-16 ENCOUNTER — Other Ambulatory Visit: Payer: Self-pay

## 2022-08-16 MED ORDER — TOPIRAMATE 50 MG PO TABS
ORAL_TABLET | ORAL | 5 refills | Status: DC
  Filled 2022-08-16: qty 120, 30d supply, fill #0
  Filled 2022-09-18 – 2022-09-27 (×3): qty 120, 30d supply, fill #1
  Filled 2022-11-08: qty 120, 30d supply, fill #2
  Filled 2022-12-18: qty 120, 30d supply, fill #3
  Filled 2023-01-05: qty 120, 30d supply, fill #4
  Filled 2023-03-20: qty 120, 30d supply, fill #5

## 2022-08-16 MED ORDER — DIVALPROEX SODIUM 500 MG PO DR TAB
DELAYED_RELEASE_TABLET | ORAL | 5 refills | Status: DC
  Filled 2022-08-16: qty 60, 30d supply, fill #0
  Filled 2022-09-15: qty 60, 30d supply, fill #1
  Filled 2022-10-24: qty 60, 30d supply, fill #2
  Filled 2022-11-30 – 2022-12-04 (×2): qty 60, 30d supply, fill #3
  Filled 2023-01-05: qty 60, 30d supply, fill #4
  Filled 2023-02-14: qty 60, 30d supply, fill #5

## 2022-08-18 ENCOUNTER — Other Ambulatory Visit: Payer: Self-pay

## 2022-08-18 ENCOUNTER — Other Ambulatory Visit (HOSPITAL_COMMUNITY): Payer: Self-pay

## 2022-08-18 MED ORDER — LOSARTAN POTASSIUM 50 MG PO TABS
50.0000 mg | ORAL_TABLET | Freq: Every day | ORAL | 0 refills | Status: DC
  Filled 2022-08-18 – 2022-09-15 (×2): qty 90, 90d supply, fill #0

## 2022-08-18 MED ORDER — DIVALPROEX SODIUM ER 500 MG PO TB24
1000.0000 mg | ORAL_TABLET | Freq: Every day | ORAL | 0 refills | Status: DC
  Filled 2022-08-18: qty 180, 90d supply, fill #0

## 2022-09-15 ENCOUNTER — Other Ambulatory Visit: Payer: Self-pay

## 2022-09-18 ENCOUNTER — Other Ambulatory Visit: Payer: Self-pay

## 2022-09-25 ENCOUNTER — Other Ambulatory Visit: Payer: Self-pay

## 2022-09-27 ENCOUNTER — Other Ambulatory Visit (HOSPITAL_COMMUNITY): Payer: Self-pay

## 2022-09-27 ENCOUNTER — Other Ambulatory Visit: Payer: Self-pay

## 2022-09-29 ENCOUNTER — Other Ambulatory Visit: Payer: Self-pay

## 2022-10-24 ENCOUNTER — Other Ambulatory Visit: Payer: Self-pay

## 2022-10-25 ENCOUNTER — Ambulatory Visit (INDEPENDENT_AMBULATORY_CARE_PROVIDER_SITE_OTHER): Payer: Self-pay | Admitting: Internal Medicine

## 2022-10-25 ENCOUNTER — Encounter: Payer: Self-pay | Admitting: Internal Medicine

## 2022-10-25 VITALS — BP 104/72 | HR 70 | Ht 63.0 in | Wt 141.0 lb

## 2022-10-25 DIAGNOSIS — E042 Nontoxic multinodular goiter: Secondary | ICD-10-CM

## 2022-10-25 NOTE — Patient Instructions (Signed)
So far thyroid nodules are stable which is great news

## 2022-10-25 NOTE — Progress Notes (Unsigned)
Name: Tammy [name]  MRN/ DOB: [id number], 15-Nov-1966    Age/ Sex: 56 y.o., female    PCP: Ladell Pier, MD   Reason for Endocrinology Evaluation: MNG     Date of Initial Endocrinology Evaluation: 10/25/2022     HPI: Ms. Tammy [name] is a 56 y.o. female with a past medical history of MNG and HTN. The patient presented for initial endocrinology clinic visit on 10/25/2022 for consultative assistance with her MNG.   Patient was diagnosed with multinodular goiter based on thyroid ultrasound 10/2019 with 2 nodules meeting FNA criteria.  She is s/p benign FNA of 4 cm isthmic nodule on 12/04/2019 and 3.6 cm left inferior nodule on 12/04/2019   Repeat ultrasound in March 2023 shows stability of these nodules  She denies  local neck symptoms, pain or dysphagia   Denies palpitations  Denies diarrhea or loose stools  Has occasional tremors  No Fh of thyroid disease     HISTORY:  Past Medical History:  Past Medical History:  Diagnosis Date   Asthma    Blood transfusion without reported diagnosis    'a long time ago"   GERD (gastroesophageal reflux disease)    Headache(784.0)    Hypertension    Seizures ([place])    Past Surgical History:  Past Surgical History:  Procedure Laterality Date   HERNIA REPAIR     UTERINE FIBROID SURGERY      Social History:  reports that she has never smoked. She has never used smokeless tobacco. She reports current drug use. Drug: Marijuana. She reports that she does not drink alcohol. Family History: family history includes Heart attack in her father; Heart disease in her mother; Migraines in her mother.   HOME MEDICATIONS: Allergies as of 10/25/2022       Reactions   Dilantin [phenytoin] Swelling   Aspirin Other (See Comments)   Patient unsure of reaction    Tramadol Itching, Other (See Comments)   Pt states she ins unsure of reaction Pt states she ins unsure of reaction Pt states she ins unsure of reaction Pt states she ins  unsure of reaction        Medication List        Accurate as of October 25, 2022 10:28 AM. If you have any questions, ask your nurse or doctor.          acetaminophen 500 MG tablet Commonly known as: TYLENOL Take 500 mg by mouth every 6 (six) hours as needed.   amLODipine 5 MG tablet Commonly known as: NORVASC TAKE 1 TABLET (5 MG TOTAL) BY MOUTH DAILY.   divalproex 500 MG DR tablet Commonly known as: DEPAKOTE TAKE 1 TABLET BY MOUTH IN THE MORNING AND AT BEDTIME What changed: Another medication with the same name was removed. Continue taking this medication, and follow the directions you see here. Changed by: Dorita Sciara, MD   losartan 50 MG tablet Commonly known as: COZAAR Take 1 tablet (50 mg total) by mouth daily.   SUMAtriptan 50 MG tablet Commonly known as: Imitrex Take 1 tablet at onset of migraine. May repeat in 2 hours if headache persists or recurs. Do not take more than 3 a week   topiramate 50 MG tablet Commonly known as: TOPAMAX TAKE 2 TABLETS BY MOUTH IN THE MORNING AND AT BEDTIME   Vitamin D (Ergocalciferol) 1.25 MG (50000 UNIT) Caps capsule Commonly known as: DRISDOL Take 1 capsule (50,000 Units total) by mouth every 7 (seven) days.  REVIEW OF SYSTEMS: A comprehensive ROS was conducted with the patient and is negative except as per HPI    OBJECTIVE:  VS: BP 104/72 (BP Location: Left Arm, Patient Position: Sitting, Cuff Size: Small)   Pulse 70   Ht '5\' 3"'$  (1.6 m)   Wt 141 lb (64 kg)   LMP 03/08/2016   BMI 24.98 kg/m    Wt Readings from Last 3 Encounters:  10/25/22 141 lb (64 kg)  02/20/22 126 lb 12.8 oz (57.5 kg)  02/16/22 130 lb (59 kg)     EXAM: General: Pt appears well and is in NAD  Eyes: External eye exam normal without stare, lid lag or exophthalmos.  EOM intact.  PERRL.  Neck: General: Supple without adenopathy. Thyroid: Isthmic and left thyroid  nodules appreciated. No thyroid bruit.  Lungs: Clear with  good BS bilat with no rales, rhonchi, or wheezes  Heart: Auscultation: RRR.  Abdomen: Normoactive bowel sounds, soft, nontender, without masses or organomegaly palpable  Extremities:  BL LE: No pretibial edema normal ROM and strength.  Mental Status: Judgment, insight: Intact Orientation: Oriented to time, place, and person Mood and affect: No depression, anxiety, or agitation     DATA REVIEWED:    Latest Reference Range & Units 01/17/22 16:52  TSH 0.450 - 4.500 uIU/mL 1.670  Triiodothyronine,Free,Serum 2.0 - 4.4 pg/mL 2.0  T4,Free(Direct) 0.82 - 1.77 ng/dL 1.22     Thyroid ultrasound 01/25/2022  Estimated total number of nodules >/= 1 cm: 2   Number of spongiform nodules >/=  2 cm not described below (TR1): 0   Number of mixed cystic and solid nodules >/= 1.5 cm not described below ([place]): 0   _________________________________________________________   The previously biopsy mid isthmus TR 3 nodule measures 4.0 x 3.6 x 3.8 cm, previously 4.0 x 3.8 x 2.4 cm. No significant interval change.   The previously biopsied left mid thyroid TR 4 nodule measures 3.6 x 2.4 x 2.7 cm, previously 3.6 x 2.5 x 2.4 cm. Also no significant interval change.   No new thyroid nodule.  No hypervascularity or regional adenopathy.   IMPRESSION: Stable previously biopsied mid isthmus TR 3 nodule and left mid thyroid TR 4 nodule. Correlate with prior pathology.   No new thyroid abnormality.    FNA isthmic nodule 12/04/2019 Clinical History: 4.0 cm 3.8 cm 2.4 cm, Thyroid mid isthmus  Specimen Submitted:  A. THYROID, ISTHMUS, FINE NEEDLE ASPIRATION:    FINAL MICROSCOPIC DIAGNOSIS:  - Consistent with benign follicular nodule (Bethesda category II)    FNA left nodule 12/04/2019  Clinical History: 3.6 cm x 2.5 cm x 2.4 cm, Left inferior thyroid Specimen Submitted:  A. THYROID, LEFT, FINE NEEDLE ASPIRATION:   FINAL MICROSCOPIC DIAGNOSIS: - Consistent with benign follicular nodule (Bethesda  category II)      ASSESSMENT/PLAN/RECOMMENDATIONS:   Multinodular goiter:  - No local neck symptoms  - Pt is clinically and biochemically euthyroid  -She is s/p 3.6 cm left inferior nodule and isthmic 4.0 cm nodule with benign cytology in 2021 -Thyroid ultrasound this year continues to show stability -Reassurance provided at this time, will continue to monitor annually for total of 5 years   Signed electronically by: Mack Guise, MD  [facility]  [facility] [address] [place], [place] [address] Phone: [phone] FAX: [phone]   CC: Ladell Pier, MD [address] [place] [address] Phone: [phone] Fax: [phone]   Return to Endocrinology clinic  as below: Future Appointments  Date Time Provider [place]  11/08/2022  1:50 PM Argentina Donovan, PA-C [id number] None

## 2022-11-08 ENCOUNTER — Other Ambulatory Visit: Payer: Self-pay

## 2022-11-08 ENCOUNTER — Ambulatory Visit: Payer: Self-pay | Attending: Physician Assistant | Admitting: Physician Assistant

## 2022-11-08 ENCOUNTER — Encounter: Payer: Self-pay | Admitting: Physician Assistant

## 2022-11-08 VITALS — BP 128/83 | HR 83 | Wt 145.0 lb

## 2022-11-08 DIAGNOSIS — I10 Essential (primary) hypertension: Secondary | ICD-10-CM

## 2022-11-08 MED ORDER — LOSARTAN POTASSIUM 50 MG PO TABS
50.0000 mg | ORAL_TABLET | Freq: Every day | ORAL | 1 refills | Status: DC
  Filled 2022-11-08 – 2022-12-18 (×2): qty 90, 90d supply, fill #0
  Filled 2023-03-20: qty 90, 90d supply, fill #1

## 2022-11-08 NOTE — Progress Notes (Signed)
Patient ID: Tammy [name], female   DOB: 05-28-1966, 56 y.o.   MRN: [id number]       Tammy [name], is a 56 y.o. female  IEP:[id number]  YSA:[id number]  DOB - [birth year]  Chief Complaint  Patient presents with   Hypertension   Medication Refill       Subjective:   [name] is a 56 y.o. female here today for  Bp check after starting losartan.  No new issues or concerns.  Just seen by endocrine.  No HA/CP/SOB/dizziness.     From endocrine A/P  11/29: Multinodular goiter:   - No local neck symptoms  - Pt is clinically and biochemically euthyroid  -She is s/p 3.6 cm left inferior nodule and isthmic 4.0 cm nodule with benign cytology in 2021 -Thyroid ultrasound this year continues to show stability -Reassurance provided at this time, will continue to monitor annually for total of 5 years No problems updated.  ALLERGIES: Allergies  Allergen Reactions   Dilantin [Phenytoin] Swelling   Aspirin Other (See Comments)    Patient unsure of reaction    Tramadol Itching and Other (See Comments)    Pt states she ins unsure of reaction Pt states she ins unsure of reaction Pt states she ins unsure of reaction Pt states she ins unsure of reaction    PAST MEDICAL HISTORY: Past Medical History:  Diagnosis Date   Asthma    Blood transfusion without reported diagnosis    'a long time ago"   GERD (gastroesophageal reflux disease)    Headache(784.0)    Hypertension    Seizures ([place])     MEDICATIONS AT HOME: Prior to Admission medications   Medication Sig Start Date End Date Taking? Authorizing Provider  acetaminophen (TYLENOL) 500 MG tablet Take 500 mg by mouth every 6 (six) hours as needed.   Yes [provider]  divalproex (DEPAKOTE) 500 MG DR tablet TAKE 1 TABLET BY MOUTH IN THE MORNING AND AT BEDTIME 06/18/22  Yes   SUMAtriptan (IMITREX) 50 MG tablet Take 1 tablet at onset of migraine. May repeat in 2 hours if headache persists or recurs. Do not take more  than 3 a week 10/05/21  Yes Cameron Sprang, MD  topiramate (TOPAMAX) 50 MG tablet TAKE 2 TABLETS BY MOUTH IN THE MORNING AND AT BEDTIME 06/21/22  Yes   Vitamin D, Ergocalciferol, (DRISDOL) 1.25 MG (50000 UNIT) CAPS capsule Take 1 capsule (50,000 Units total) by mouth every 7 (seven) days. 08/25/21  Yes Freeman Caldron M, PA-C  losartan (COZAAR) 50 MG tablet Take 1 tablet (50 mg total) by mouth daily. 11/08/22   Argentina Donovan, PA-C  lisinopril-hydrochlorothiazide (ZESTORETIC) 10-12.5 MG tablet Take 2 tablets by mouth daily. To lower blood pressure Patient not taking: Reported on 12/05/2020 08/12/20 12/08/20  Camillia Herter, NP    ROS: Neg HEENT Neg resp Neg cardiac Neg GI Neg GU Neg MS Neg psych Neg neuro  Objective:   Vitals:   11/08/22 1350  BP: 128/83  Pulse: 83  SpO2: 100%  Weight: 145 lb (65.8 kg)   Exam General appearance : Awake, alert, not in any distress. Speech Clear. Not toxic looking HEENT: Atraumatic and Normocephalic Neck: Supple, no JVD. No cervical lymphadenopathy.  Chest: Good air entry bilaterally, CTAB.  No rales/rhonchi/wheezing CVS: S1 S2 regular, no murmurs.  Extremities: B/L Lower Ext shows no edema, both legs are warm to touch Neurology: Awake alert, and oriented X 3, CN II-XII intact, Non focal Skin: No Rash  Data Review No results found for: "HGBA1C"  Assessment & Plan   1. Essential hypertension Controlled-extensive labs done 07/2022 and unremarkable - losartan (COZAAR) 50 MG tablet; Take 1 tablet (50 mg total) by mouth daily.  Dispense: 90 tablet; Refill: 1    Return in about 5 months (around 04/09/2023) for Dr Elsie Amis for chronic conditions.  The patient was given clear instructions to go to ER or return to medical center if symptoms don't improve, worsen or new problems develop. The patient verbalized understanding. The patient was told to call to get lab results if they haven't heard anything in the next week.      Freeman Caldron,  PA-C [facility] [place], [place]   11/08/2022, 2:10 PM

## 2022-11-14 ENCOUNTER — Other Ambulatory Visit: Payer: Self-pay

## 2022-11-30 ENCOUNTER — Other Ambulatory Visit (HOSPITAL_COMMUNITY): Payer: Self-pay

## 2022-12-04 ENCOUNTER — Other Ambulatory Visit: Payer: Self-pay

## 2022-12-04 ENCOUNTER — Other Ambulatory Visit (HOSPITAL_COMMUNITY): Payer: Self-pay

## 2022-12-18 ENCOUNTER — Other Ambulatory Visit: Payer: Self-pay

## 2023-01-05 ENCOUNTER — Other Ambulatory Visit: Payer: Self-pay

## 2023-01-08 ENCOUNTER — Other Ambulatory Visit: Payer: Self-pay

## 2023-02-14 ENCOUNTER — Other Ambulatory Visit: Payer: Self-pay

## 2023-03-20 ENCOUNTER — Other Ambulatory Visit: Payer: Self-pay

## 2023-03-20 ENCOUNTER — Other Ambulatory Visit (HOSPITAL_COMMUNITY): Payer: Self-pay

## 2023-03-26 ENCOUNTER — Other Ambulatory Visit: Payer: Self-pay

## 2023-03-27 ENCOUNTER — Telehealth: Payer: Self-pay | Admitting: Anesthesiology

## 2023-03-27 ENCOUNTER — Other Ambulatory Visit: Payer: Self-pay

## 2023-03-27 MED ORDER — DIVALPROEX SODIUM 500 MG PO DR TAB
DELAYED_RELEASE_TABLET | ORAL | 5 refills | Status: DC
  Filled 2023-03-27: qty 60, 30d supply, fill #0
  Filled 2023-04-25 – 2023-05-03 (×2): qty 60, 30d supply, fill #1

## 2023-03-27 MED ORDER — SUMATRIPTAN SUCCINATE 50 MG PO TABS
ORAL_TABLET | ORAL | 5 refills | Status: DC
  Filled 2023-03-27: qty 9, 30d supply, fill #0
  Filled 2023-04-25 – 2023-05-03 (×2): qty 9, 30d supply, fill #1

## 2023-03-27 NOTE — Telephone Encounter (Signed)
Pt called requesting refills on her medications. Pt has appt scheduled in 08/2023.   1. Which medications need refilled? Depakote 500 mg  2. Which pharmacy/location is medication to be sent to? [facility]   3. Do they need a 30 day or 90 day supply? 90 day supply     1. Which medications need refilled? Sumatriptan 50 mg  2. Which pharmacy/location is medication to be sent to? Same as above  3. Do they need a 30 day or 90 day supply? 90 day supply

## 2023-03-27 NOTE — Telephone Encounter (Signed)
Refill sent in for pt. 

## 2023-03-28 ENCOUNTER — Other Ambulatory Visit: Payer: Self-pay

## 2023-04-09 ENCOUNTER — Other Ambulatory Visit: Payer: Self-pay

## 2023-04-09 ENCOUNTER — Ambulatory Visit: Payer: Medicaid Other | Attending: Internal Medicine | Admitting: Internal Medicine

## 2023-04-09 ENCOUNTER — Encounter: Payer: Self-pay | Admitting: Internal Medicine

## 2023-04-09 VITALS — BP 136/82 | HR 65 | Temp 98.0°F | Ht 63.0 in | Wt 148.0 lb

## 2023-04-09 DIAGNOSIS — Z1231 Encounter for screening mammogram for malignant neoplasm of breast: Secondary | ICD-10-CM

## 2023-04-09 DIAGNOSIS — G40909 Epilepsy, unspecified, not intractable, without status epilepticus: Secondary | ICD-10-CM | POA: Diagnosis not present

## 2023-04-09 DIAGNOSIS — Z760 Encounter for issue of repeat prescription: Secondary | ICD-10-CM | POA: Insufficient documentation

## 2023-04-09 DIAGNOSIS — I10 Essential (primary) hypertension: Secondary | ICD-10-CM | POA: Insufficient documentation

## 2023-04-09 DIAGNOSIS — J4520 Mild intermittent asthma, uncomplicated: Secondary | ICD-10-CM | POA: Insufficient documentation

## 2023-04-09 DIAGNOSIS — E049 Nontoxic goiter, unspecified: Secondary | ICD-10-CM | POA: Diagnosis not present

## 2023-04-09 DIAGNOSIS — N3941 Urge incontinence: Secondary | ICD-10-CM | POA: Insufficient documentation

## 2023-04-09 MED ORDER — ACETAMINOPHEN 500 MG PO TABS
500.0000 mg | ORAL_TABLET | Freq: Three times a day (TID) | ORAL | 1 refills | Status: AC | PRN
  Filled 2023-04-09: qty 60, 20d supply, fill #0

## 2023-04-09 MED ORDER — LOSARTAN POTASSIUM 100 MG PO TABS
100.0000 mg | ORAL_TABLET | Freq: Every day | ORAL | 1 refills | Status: DC
Start: 2023-04-09 — End: 2023-09-07
  Filled 2023-04-09: qty 90, 90d supply, fill #0
  Filled 2023-06-20: qty 90, 90d supply, fill #1

## 2023-04-09 MED ORDER — SOLIFENACIN SUCCINATE 5 MG PO TABS
5.0000 mg | ORAL_TABLET | Freq: Every day | ORAL | 4 refills | Status: DC
Start: 2023-04-09 — End: 2024-02-01
  Filled 2023-04-09: qty 30, 30d supply, fill #0
  Filled 2024-01-03: qty 30, 30d supply, fill #1

## 2023-04-09 MED ORDER — ALBUTEROL SULFATE HFA 108 (90 BASE) MCG/ACT IN AERS
2.0000 | INHALATION_SPRAY | Freq: Four times a day (QID) | RESPIRATORY_TRACT | 2 refills | Status: AC | PRN
Start: 2023-04-09 — End: ?
  Filled 2023-04-09: qty 18, 25d supply, fill #0
  Filled 2023-06-20: qty 18, 25d supply, fill #1

## 2023-04-09 NOTE — Progress Notes (Signed)
Patient ID: Tammy [name], female    DOB: October 04, 1966  MRN: [id number]  CC: Hypertension (HTN f/u. Requesting refill on inhaler and Tylenol/Yes to pap smear at different appt. Shingles vax at next appt.)   Subjective: Tammy [name] Tammy [name] is a 57 y.o. 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  Also on Topamax 50 mg 2 tablets twice a day.  Compliant with meds.  No recent sz episodes.  She does not drive Has f/u with Dr. Karel Jarvis later this yr. she had normal CBC and chemistry the end of February through [facility].  Reports some incontinence of urine.  Not able to hold urine when she gets urge to urinate.  Sometimes she wets her bed. Started wearing pads and has a mattress protector.   HTN: Since I last saw her, she was placed on Cozaar 50 mg daily.  [place] already for today.  Limits salt in foods.  No CP/SOB.  Some swelling at times in LT leg  MNG: Saw Dr. Brooks Sailors in follow-up 10/25/2022.  Plan is to continue to monitor annually for the next 5 years.  Last thyroid ultrasound was 01/2022.  Denies any problems swallowing or SOB.  She has upcoming appointment with the endocrinologist later this month.  Request rxn for Albuterol inhaler.  Sometimes gets flare of asthma during summer months.  Also requests refill on Tylenol.  She tells me that she takes the Tylenol sometimes instead of the sumatriptan.  HM:  due for MMG and PAP.  Wants to defer on shingrix until next visit.   Patient Active Problem List   Diagnosis Date Noted   Varicose veins of leg with edema, bilateral 02/20/2022   COVID-19 virus infection 12/05/2020   Acute superficial venous  thrombosis of lower extremity, left 12/13/2018   Essential hypertension 12/13/2018   Chronic migraine 09/07/2016   Seizure (HCC) 08/20/2016   Asthma 08/20/2016   DVT, HX OF 01/20/2008   FIBROIDS, UTERUS 01/16/2008     Current Outpatient Medications on File Prior to Visit  Medication Sig Dispense Refill   acetaminophen (TYLENOL) 500 MG tablet Take 500 mg by mouth every 6 (six) hours as needed.     divalproex (DEPAKOTE) 500 MG DR tablet TAKE 1 TABLET BY MOUTH IN THE MORNING AND AT BEDTIME 60 tablet 5   losartan (COZAAR) 50 MG tablet Take 1 tablet (50 mg total) by mouth daily. 90 tablet 1   SUMAtriptan (IMITREX) 50 MG tablet Take 1 tablet at onset of migraine. May repeat in 2 hours if headache persists or recurs. Do not take more than 3 a week 10 tablet 5   topiramate (TOPAMAX) 50 MG tablet TAKE 2 TABLETS BY MOUTH IN THE MORNING AND AT BEDTIME 120 tablet 5   Vitamin D, Ergocalciferol, (DRISDOL) 1.25 MG (50000 UNIT) CAPS capsule Take 1 capsule (50,000 Units total) by mouth every 7 (seven) days. 16 capsule 0   [DISCONTINUED] lisinopril-hydrochlorothiazide (ZESTORETIC) 10-12.5 MG tablet Take 2 tablets by mouth daily. To lower blood pressure (Patient not taking: Reported on 12/05/2020) 60 tablet 2   No current facility-administered medications on file prior to visit.    Allergies  Allergen Reactions   Dilantin [Phenytoin] Swelling  Aspirin Other (See Comments)    Patient unsure of reaction    Tramadol Itching and Other (See Comments)    Pt states she ins unsure of reaction Pt states she ins unsure of reaction Pt states she ins unsure of reaction Pt states she ins unsure of reaction    Social History   Socioeconomic History   Marital status: Single    Spouse name: Not on file   Number of children: Not on file   Years of education: Not on file   Highest education level: Not on file  Occupational History   Not on file  Tobacco Use   Smoking status: Never   Smokeless tobacco: Never   Vaping Use   Vaping Use: Never used  Substance and Sexual Activity   Alcohol use: No   Drug use: Yes    Types: Marijuana   Sexual activity: Not on file  Other Topics Concern   Not on file  Social History Narrative   Right handed    Lives with family    Social Determinants of Health   Financial Resource Strain: Not on file  Food Insecurity: Not on file  Transportation Needs: Not on file  Physical Activity: Not on file  Stress: Not on file  Social Connections: Not on file  Intimate Partner Violence: Not on file    Family History  Problem Relation Age of Onset   Heart disease Mother    Migraines Mother    Heart attack Father    Colon cancer Neg Hx     Past Surgical History:  Procedure Laterality Date   HERNIA REPAIR     UTERINE FIBROID SURGERY      ROS: Review of Systems Negative except as stated above  PHYSICAL EXAM: BP 136/82 (BP Location: Left Arm, Patient Position: Sitting, Cuff Size: Normal)   Pulse 65   Temp 98 F (36.7 C) (Oral)   Ht 5\' 3"  (1.6 m)   Wt 148 lb (67.1 kg)   LMP 03/08/2016   SpO2 99%   BMI 26.22 kg/m   Wt Readings from Last 3 Encounters:  04/09/23 148 lb (67.1 kg)  11/08/22 145 lb (65.8 kg)  10/25/22 141 lb (64 kg)  BP 134/90  Physical Exam  General appearance - alert, well appearing, middle-aged African-American female and in no distress Mental status - normal mood, behavior, speech, dress, motor activity, and thought processes Neck - supple, no significant adenopathy.  Thyroid is enlarged. Chest - clear to auscultation, no wheezes, rales or rhonchi, symmetric air entry Heart - normal rate, regular rhythm, normal S1, S2, no murmurs, rubs, clicks or gallops Extremities - peripheral pulses normal, no pedal edema, no clubbing or cyanosis      Latest Ref Rng & Units 02/20/2022    3:55 PM 08/24/2021    3:04 PM 05/04/2021   10:30 AM  CMP  Glucose 70 - 99 mg/dL 74  81  161   BUN 6 - 24 mg/dL 14  20  15    Creatinine 0.57 - 1.00  mg/dL 0.96  0.45  4.09   Sodium 134 - 144 mmol/L 140  144  137   Potassium 3.5 - 5.2 mmol/L 4.5  4.1  3.6   Chloride 96 - 106 mmol/L 103  108  103   CO2 20 - 29 mmol/L 23  20  24    Calcium 8.7 - 10.2 mg/dL 81.1  9.4  9.6   Total Protein 6.0 - 8.5 g/dL 7.3  6.6  Total Bilirubin 0.0 - 1.2 mg/dL 0.3  <4.0    Alkaline Phos 44 - 121 IU/L 43  48    AST 0 - 40 IU/L 16  18    ALT 0 - 32 IU/L 9  6     Lipid Panel     Component Value Date/Time   CHOL 239 (H) 02/20/2022 1555   TRIG 79 02/20/2022 1555   HDL 83 02/20/2022 1555   CHOLHDL 2.9 02/20/2022 1555   LDLCALC 143 (H) 02/20/2022 1555    CBC    Component Value Date/Time   WBC 6.4 02/20/2022 1555   WBC 8.1 05/04/2021 1030   RBC 4.10 02/20/2022 1555   RBC 4.63 05/04/2021 1030   HGB 12.1 02/20/2022 1555   HCT 37.5 02/20/2022 1555   PLT 192 02/20/2022 1555   MCV 92 02/20/2022 1555   MCH 29.5 02/20/2022 1555   MCH 28.5 05/04/2021 1030   MCHC 32.3 02/20/2022 1555   MCHC 31.3 05/04/2021 1030   RDW 12.8 02/20/2022 1555   LYMPHSABS 1.0 05/04/2021 1030   LYMPHSABS 2.5 12/22/2020 1629   MONOABS 0.2 05/04/2021 1030   EOSABS 0.0 05/04/2021 1030   EOSABS 0.1 12/22/2020 1629   BASOSABS 0.0 05/04/2021 1030   BASOSABS 0.0 12/22/2020 1629    ASSESSMENT AND PLAN: 1. Essential hypertension Not at goal.  Increase Cozaar to 100 mg daily. - losartan (COZAAR) 100 MG tablet; Take 1 tablet (100 mg total) by mouth daily.  Dispense: 90 tablet; Refill: 1 - Basic Metabolic Panel; Future  2. Thyroid goiter Denies any compressive symptoms at this time.  Keep upcoming appointment with endocrinology.  3. Urge incontinence of urine Discussed urge incontinence.  She is agreeable to trying low-dose of Vesicare.  Also agreeable for prescription to be sent for incontinence supplies to [facility].  She prefers the incontinence pads. - solifenacin (VESICARE) 5 MG tablet; Take 1 tablet (5 mg total) by mouth daily.  Dispense: 30 tablet; Refill: 4 - For home  use only DME Other see comment  4. Seizure disorder (HCC) Stable on Depakote and Topamax.  5. Mild intermittent asthma without complication - albuterol (VENTOLIN HFA) 108 (90 Base) MCG/ACT inhaler; Inhale 2 puffs into the lungs every 6 (six) hours as needed for wheezing or shortness of breath.  Dispense: 18 g; Refill: 2  6. Encounter for screening mammogram for malignant neoplasm of breast Referral submitted for mammogram.     Patient was given the opportunity to ask questions.  Patient verbalized understanding of the plan and was able to repeat key elements of the plan.   This documentation was completed using Paediatric nurse.  Any transcriptional errors are unintentional.  No orders of the defined types were placed in this encounter.    Requested Prescriptions    No prescriptions requested or ordered in this encounter    No follow-ups on file.  Jonah Blue, MD, FACP

## 2023-04-09 NOTE — Patient Instructions (Signed)
Your blood pressure is not at goal.  I recommend increasing the losartan to 100 mg daily.  This means you will start taking 2 of the 50 mg tablets until you run out.  I have sent an updated prescription to the pharmacy for the 100 mg tablets.  Once you have been on the increased dose for 2 weeks, please return to the lab to have blood test done to check kidney function.  Start the medication called Vesicare to help with urinary incontinence.  We will send a prescription to [facility] to request incontinence supplies for you as discussed.

## 2023-04-23 ENCOUNTER — Encounter: Payer: Self-pay | Admitting: Internal Medicine

## 2023-04-23 NOTE — Progress Notes (Signed)
Received letter from [facility] dated 04/12/23 from Dr. Beverley Fiedler.  Pt receiving treatment at [facility] for PTSD, Depression and cannabis UD.  No meds prescribed at this time.

## 2023-04-24 ENCOUNTER — Ambulatory Visit: Payer: Medicaid Other | Attending: Family Medicine

## 2023-04-24 DIAGNOSIS — I10 Essential (primary) hypertension: Secondary | ICD-10-CM

## 2023-04-25 ENCOUNTER — Other Ambulatory Visit: Payer: Self-pay

## 2023-04-25 ENCOUNTER — Ambulatory Visit (INDEPENDENT_AMBULATORY_CARE_PROVIDER_SITE_OTHER): Payer: BLUE CROSS/BLUE SHIELD | Admitting: Internal Medicine

## 2023-04-25 ENCOUNTER — Encounter: Payer: Self-pay | Admitting: Internal Medicine

## 2023-04-25 VITALS — BP 132/78 | HR 64 | Ht 63.0 in | Wt 147.0 lb

## 2023-04-25 DIAGNOSIS — E042 Nontoxic multinodular goiter: Secondary | ICD-10-CM

## 2023-04-25 LAB — T4, FREE: Free T4: 0.71 ng/dL (ref 0.60–1.60)

## 2023-04-25 LAB — BASIC METABOLIC PANEL
BUN/Creatinine Ratio: 19 (ref 9–23)
BUN: 16 mg/dL (ref 6–24)
CO2: 20 mmol/L (ref 20–29)
Calcium: 10.3 mg/dL — ABNORMAL HIGH (ref 8.7–10.2)
Chloride: 108 mmol/L — ABNORMAL HIGH (ref 96–106)
Creatinine, Ser: 0.84 mg/dL (ref 0.57–1.00)
Glucose: 80 mg/dL (ref 70–99)
Potassium: 3.7 mmol/L (ref 3.5–5.2)
Sodium: 144 mmol/L (ref 134–144)
eGFR: 83 mL/min/{1.73_m2} (ref >59)

## 2023-04-25 LAB — TSH: TSH: 0.61 u[IU]/mL (ref 0.35–5.50)

## 2023-04-25 NOTE — Progress Notes (Signed)
Name: Tammy [name]  MRN/ DOB: [id number], 07-03-1966    Age/ Sex: 57 y.o., female    PCP: Tammy Matar, MD   Reason for Endocrinology Evaluation: MNG     Date of Initial Endocrinology Evaluation: 10/25/2022    HPI: Tammy [name] is a 57 y.o. female with a past medical history of MNG and HTN, Hx of Left LE DVT. The patient presented for initial endocrinology clinic visit on 10/25/2022 for consultative assistance with her MNG.   Patient was diagnosed with multinodular goiter based on thyroid ultrasound 10/2019 with 2 nodules meeting FNA criteria.  She is s/p benign FNA of 4 cm isthmic nodule on 12/04/2019 and 3.6 cm left inferior nodule on 12/04/2019   Repeat ultrasound in March 2023 shows stability of these nodules   No family history of thyroid disease  SUBJECTIVE:    Today (04/25/23): Tammy [name] is here for follow-up on multinodular goiter.    She denies  local neck symptoms, pain or dysphagia  Denies palpitations  Denies diarrhea or loose stools  Has occasional left hand tremors She is unclear about anxiety    HISTORY:  Past Medical History:  Past Medical History:  Diagnosis Date   Asthma    Blood transfusion without reported diagnosis    'a long time ago"   GERD (gastroesophageal reflux disease)    Headache(784.0)    Hypertension    Seizures (HCC)    Past Surgical History:  Past Surgical History:  Procedure Laterality Date   HERNIA REPAIR     UTERINE FIBROID SURGERY      Social History:  reports that she has never smoked. She has never used smokeless tobacco. She reports current drug use. Drug: Marijuana. She reports that she does not drink alcohol. Family History: family history includes Heart attack in her father; Heart disease in her mother; Migraines in her mother.   HOME MEDICATIONS: Allergies as of 04/25/2023       Reactions   Dilantin [phenytoin] Swelling   Aspirin Other (See Comments)   Patient unsure of reaction     Tramadol Itching, Other (See Comments)   Pt states she ins unsure of reaction Pt states she ins unsure of reaction Pt states she ins unsure of reaction Pt states she ins unsure of reaction        Medication List        Accurate as of Apr 25, 2023 10:10 AM. If you have any questions, ask your nurse or doctor.          acetaminophen 500 MG tablet Commonly known as: TYLENOL Take 1 tablet (500 mg total) by mouth every 8 (eight) hours as needed.   divalproex 500 MG DR tablet Commonly known as: DEPAKOTE TAKE 1 TABLET BY MOUTH IN THE MORNING AND AT BEDTIME   losartan 100 MG tablet Commonly known as: COZAAR Take 1 tablet (100 mg total) by mouth daily.   solifenacin 5 MG tablet Commonly known as: VESIcare Take 1 tablet (5 mg total) by mouth daily.   SUMAtriptan 50 MG tablet Commonly known as: Imitrex Take 1 tablet at onset of migraine. May repeat in 2 hours if headache persists or recurs. Do not take more than 3 a week   topiramate 50 MG tablet Commonly known as: TOPAMAX TAKE 2 TABLETS BY MOUTH IN THE MORNING AND AT BEDTIME   Ventolin HFA 108 (90 Base) MCG/ACT inhaler Generic drug: albuterol Inhale 2 puffs into the lungs every 6 (six) hours  as needed for wheezing or shortness of breath.   Vitamin D (Ergocalciferol) 1.25 MG (50000 UNIT) Caps capsule Commonly known as: DRISDOL Take 1 capsule (50,000 Units total) by mouth every 7 (seven) days.          REVIEW OF SYSTEMS: A comprehensive ROS was conducted with the patient and is negative except as per HPI    OBJECTIVE:  VS: BP 132/78 (BP Location: Left Arm, Patient Position: Sitting, Cuff Size: Small)   Pulse 64   Ht 5\' 3"  (1.6 m)   Wt 147 lb (66.7 kg)   LMP 03/08/2016   SpO2 99%   BMI 26.04 kg/m    Wt Readings from Last 3 Encounters:  04/25/23 147 lb (66.7 kg)  04/09/23 148 lb (67.1 kg)  11/08/22 145 lb (65.8 kg)     EXAM: General: Pt appears well and is in NAD  Eyes: External eye exam normal without  stare, lid lag or exophthalmos.  EOM intact.   Neck: General: Supple without adenopathy. Thyroid: Isthmic nodules appreciated on today's exam, unable to appreciate left thyroid nodule.  Lungs: Clear with good BS bilat   Heart: Auscultation: RRR.  Extremities:  BL LE: No pretibial edema   Mental Status: Judgment, insight: Intact Orientation: Oriented to time, place, and person Mood and affect: No depression, anxiety, or agitation     DATA REVIEWED:   Latest Reference Range & Units 04/25/23 10:33  TSH 0.35 - 5.50 uIU/mL 0.61  T4,Free(Direct) 0.60 - 1.60 ng/dL 8.65     Thyroid ultrasound 01/25/2022  Estimated total number of nodules >/= 1 cm: 2   Number of spongiform nodules >/=  2 cm not described below (TR1): 0   Number of mixed cystic and solid nodules >/= 1.5 cm not described below (TR2): 0   _________________________________________________________   The previously biopsy mid isthmus TR 3 nodule measures 4.0 x 3.6 x 3.8 cm, previously 4.0 x 3.8 x 2.4 cm. No significant interval change.   The previously biopsied left mid thyroid TR 4 nodule measures 3.6 x 2.4 x 2.7 cm, previously 3.6 x 2.5 x 2.4 cm. Also no significant interval change.   No new thyroid nodule.  No hypervascularity or regional adenopathy.   IMPRESSION: Stable previously biopsied mid isthmus TR 3 nodule and left mid thyroid TR 4 nodule. Correlate with prior pathology.   No new thyroid abnormality.    FNA isthmic nodule 12/04/2019 Clinical History: 4.0 cm 3.8 cm 2.4 cm, Thyroid mid isthmus  Specimen Submitted:  A. THYROID, ISTHMUS, FINE NEEDLE ASPIRATION:    FINAL MICROSCOPIC DIAGNOSIS:  - Consistent with benign follicular nodule (Bethesda category II)    FNA left nodule 12/04/2019  Clinical History: 3.6 cm x 2.5 cm x 2.4 cm, Left inferior thyroid Specimen Submitted:  A. THYROID, LEFT, FINE NEEDLE ASPIRATION:   FINAL MICROSCOPIC DIAGNOSIS: - Consistent with benign follicular nodule (Bethesda  category II)      ASSESSMENT/PLAN/RECOMMENDATIONS:   Multinodular goiter:  - No local neck symptoms  - Pt is clinically and biochemically euthyroid  -She is s/p 3.6 cm left inferior nodule and isthmic 4.0 cm nodule with benign cytology in 2021 -Will repeat thyroid ultrasound for this year   Follow-up in 1 year  Signed electronically by: Lyndle Herrlich, MD  [facility] Endocrinology  [facility] [address] [place] [place], [place] [address] Phone: [phone] FAX: [phone]   CC: Tammy Matar, MD [address] [place] [address] [place] [address] Phone: [phone]  Fax: [phone]   Return to Endocrinology clinic as below: Future Appointments  Date Time Provider Department Center  05/18/2023 11:20 AM [facility] MM 2 [facility]  06/01/2023  2:50 PM Tammy Matar, MD [facility] None  09/26/2023  8:30 AM Van Clines, MD [facility] None

## 2023-05-02 ENCOUNTER — Other Ambulatory Visit: Payer: Self-pay

## 2023-05-03 ENCOUNTER — Telehealth: Payer: Self-pay | Admitting: Neurology

## 2023-05-03 ENCOUNTER — Other Ambulatory Visit: Payer: Self-pay

## 2023-05-03 NOTE — Telephone Encounter (Signed)
Pt called she moved into her apartment and she feel blah and down, she has had headaches, she is not sleeping well. She will wait to talk to Dr Karel Jarvis. She is asking for a green card for CBD

## 2023-05-03 NOTE — Telephone Encounter (Signed)
Left message with the after hour service on 05-03-23 at 12:23 pm   Caller states that she needs to speak with someone about her PTSD as it is getting bad  and she needs to talk with someone about getting her green card

## 2023-05-03 NOTE — Telephone Encounter (Signed)
Pt is sch for 05-11-23

## 2023-05-07 ENCOUNTER — Other Ambulatory Visit: Payer: Self-pay

## 2023-05-11 ENCOUNTER — Other Ambulatory Visit (HOSPITAL_BASED_OUTPATIENT_CLINIC_OR_DEPARTMENT_OTHER): Payer: Self-pay

## 2023-05-11 ENCOUNTER — Other Ambulatory Visit: Payer: Self-pay | Admitting: Neurology

## 2023-05-11 ENCOUNTER — Other Ambulatory Visit (HOSPITAL_COMMUNITY): Payer: Self-pay

## 2023-05-11 ENCOUNTER — Ambulatory Visit (INDEPENDENT_AMBULATORY_CARE_PROVIDER_SITE_OTHER): Payer: BLUE CROSS/BLUE SHIELD | Admitting: Neurology

## 2023-05-11 ENCOUNTER — Other Ambulatory Visit: Payer: Self-pay

## 2023-05-11 ENCOUNTER — Encounter: Payer: Self-pay | Admitting: Neurology

## 2023-05-11 VITALS — BP 106/77 | HR 43 | Ht 63.0 in | Wt 138.4 lb

## 2023-05-11 DIAGNOSIS — G40009 Localization-related (focal) (partial) idiopathic epilepsy and epileptic syndromes with seizures of localized onset, not intractable, without status epilepticus: Secondary | ICD-10-CM | POA: Diagnosis not present

## 2023-05-11 DIAGNOSIS — G43709 Chronic migraine without aura, not intractable, without status migrainosus: Secondary | ICD-10-CM

## 2023-05-11 MED ORDER — SUMATRIPTAN SUCCINATE 50 MG PO TABS
ORAL_TABLET | ORAL | 5 refills | Status: DC
  Filled 2023-05-11: qty 10, fill #0
  Filled 2023-06-20: qty 10, 30d supply, fill #0

## 2023-05-11 MED ORDER — DIVALPROEX SODIUM 500 MG PO DR TAB
DELAYED_RELEASE_TABLET | ORAL | 3 refills | Status: DC
  Filled 2023-05-11: qty 180, fill #0
  Filled 2023-06-20: qty 180, 90d supply, fill #0

## 2023-05-11 MED ORDER — TOPIRAMATE 50 MG PO TABS
ORAL_TABLET | ORAL | 3 refills | Status: DC
  Filled 2023-05-11: qty 360, 90d supply, fill #0
  Filled 2023-06-20 – 2023-09-28 (×3): qty 360, 90d supply, fill #1

## 2023-05-11 NOTE — Progress Notes (Signed)
NEUROLOGY FOLLOW UP OFFICE NOTE  Tammy [name] [id number] 1966-07-16  HISTORY OF PRESENT ILLNESS: I had the pleasure of seeing [name] in follow-up in the neurology clinic on 05/11/2023.  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 She is alone in the office today.  Records and images were personally reviewed where available.  She does not recall the last time she had a seizure, stating if she had one they would be little ones. There is an ER visit at [facility] last 08/17/22 where she was found confused from a fall, she admitted to missing doses of her medications. Depakote level was undetectable. She was back in the ER the next day but it was unclear if she had another episodes. She lives a lone, her daughter calls her to make sure she takes her medications. She denies any further falls since 07/2022. She denies any olfactory/gustatory hallucinations, focal numbness/tingling/weakness, myoclonic jerks. She is on Topiramate 50mg  2 tabs BID (100mg  BID) and Depakote 500mg  BID without side effects. She has migraines 3-4 times a month and takes sumatriptan which helps but makes her drowsy. She denies any dizziness. She has to squint when reading. She endorses mood swings. She gets at least 8 hours of sleep. She does not drive.    History on Initial Assessment 02/22/2021: This is a 57 year old right-handed woman with a history of hypertension, migraines, complex partial seizures with secondary generalization, presenting to establish care. Seizures started in childhood, she was having frequent passing out episodes. She had a nocturnal seizure in 2010 and was started on Dilantin, lost to follow-up until 2017 when she saw neurologist Dr. Terrace Arabia and reported 2-3 seizures every month preceded by lights flashing, bell ringing, then loss of consciousness. Dilantin apparently caused throat swelling, she was taking Keppra. She was started on Depakote in 2017 for seizure and  migraine prophylaxis. She was admitted to [facility] in January 2022 after she was found unresponsive, with tongue bite and incontinence. She remained encephalopathic in the ER, then had gaze deviation, not following commands. She was found to be COVID positive. She was given IV Ativan and Keppra, EEG after showed diffuse slowing with left anterior temporal polyspikes, at times with PLEDs-like appearance. She started returning to baseline around 48 hours later. 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 She was discharged home on Depakote 500mg  BID (which she was taking prior to admission, level 68 on admission) and Levetiracetam 1000mg  BID.  Since hospital discharge, she has been living with her aunt. Prior to this, she was living with a different aunt. Family tries to be with her 24/7, rotating with each other. She reports majority of her seizures are nocturnal, she has 2-3 nocturnal seizures a month. She also reports episodes of confusion, she brings a piece of paper where she wrote down gibberish words when she was asked to write her name and SSN. She has occasional olfactory hallucinations where she smells a stench. When she got of of the hospital last January, she noticed right-sided weakness. She was fine yesterday, but this morning woke up with the left side of her body hurting. She reports sharp pain radiating up to her hip. No falls since January. She denies any numbness/tingling, she has occasional urinary incontinence. She has occasional uncontrollable hand tremors, L>R. Migraines started at age 89, pain usually starts on the left side of her head and radiates diffusely. She would be  sensitive to lights and sounds with occasional nausea. She has migraines 2-3 times a week. She used to take sumatriptan but felt it increased her appetite. She usually takes Tylenol which does not help much. She brings a note she wrote in 2012 about her need to see a doctor  about her feelings, not just to push pills but to listen to her. She notes depression and is tearful several times in the office today. She reports the medications make her tired, make her sad. She makes herself get up, otherwise she cannot do anything. Since January, she can't even cook for herself anymore. She works for [employer] in [place] for a week every few months. She does not drive. She reports a significant amount of weight loss and points to her goiter. She denies any dizziness, she has occasional neck pain and double vision. She reports left calf swelling and left hip pain.  Epilepsy Risk Factors:  Her grandson has seizures. 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 There is no history of febrile convulsions, CNS infections such as meningitis/encephalitis, significant traumatic brain injury, neurosurgical procedures.  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 60 tablet 1   albuterol (VENTOLIN HFA) 108 (90 Base) MCG/ACT inhaler Inhale 2 puffs into the lungs every 6 (six) hours as needed for wheezing or shortness of breath. 18 g 2   divalproex (DEPAKOTE) 500 MG DR tablet  TAKE 1 TABLET BY MOUTH IN THE MORNING AND AT BEDTIME 60 tablet 5   losartan (COZAAR) 100 MG tablet Take 1 tablet (100 mg total) by mouth daily. 90 tablet 1   solifenacin (VESICARE) 5 MG tablet Take 1 tablet (5 mg total) by mouth daily. 30 tablet 4   SUMAtriptan (IMITREX) 50 MG tablet Take 1 tablet at onset of migraine. May repeat in 2 hours if headache persists or recurs. Do not take more than 3 a week 10 tablet 5   topiramate (TOPAMAX) 50 MG tablet TAKE 2 TABLETS BY MOUTH IN THE MORNING AND AT BEDTIME 120 tablet 5   Vitamin D, Ergocalciferol, (DRISDOL) 1.25 MG (50000 UNIT) CAPS capsule Take 1 capsule (50,000 Units total) by mouth every 7 (seven) days. 16 capsule 0   [DISCONTINUED] lisinopril-hydrochlorothiazide (ZESTORETIC) 10-12.5 MG tablet Take 2 tablets by mouth daily. To lower blood pressure (Patient not taking: Reported on 12/05/2020) 60 tablet 2   No current facility-administered medications on file prior to visit.    ALLERGIES: Allergies  Allergen Reactions   Dilantin [Phenytoin] Swelling   Aspirin Other (See Comments)    Patient unsure of reaction    Tramadol Itching and Other (See Comments)  Pt states she ins unsure of reaction Pt states she ins unsure of reaction Pt states she ins unsure of reaction Pt states she ins unsure of reaction    FAMILY HISTORY: Family History  Problem Relation Age of Onset   Heart disease Mother    Migraines Mother    Heart attack Father    Colon cancer Neg Hx     SOCIAL HISTORY: Social History   Socioeconomic History   Marital status: Single    Spouse name: Not on file   Number of children: Not on file   Years of education: Not on file   Highest education level: Not on file  Occupational History   Not on file  Tobacco Use   Smoking status: Never   Smokeless tobacco: Never  Vaping Use   Vaping Use: Never used  Substance and Sexual Activity   Alcohol use: No   Drug use: Yes    Types: Marijuana   Sexual activity: Not on file   Other Topics Concern   Not on file  Social History Narrative   Right handed    Lives with family    Social Determinants of Health   Financial Resource Strain: Not on file  Food Insecurity: Not on file  Transportation Needs: Not on file  Physical Activity: Not on file  Stress: Not on file  Social Connections: Not on file  Intimate Partner Violence: Not on file     PHYSICAL EXAM: Vitals:   05/11/23 1322  BP: 106/77  Pulse: (!) 43  SpO2: 97%   General: No acute distress Head:  Normocephalic/atraumatic Skin/Extremities: No rash, no edema Neurological Exam: alert and awake. No aphasia or dysarthria. Fund of knowledge is appropriate.  Attention and concentration are normal.   Cranial nerves: Pupils equal, round. Extraocular movements intact with no nystagmus. Visual fields full.  No facial asymmetry.  Motor: Bulk and tone normal, muscle strength 5/5 throughout with no pronator drift.   Finger to nose testing intact.  Gait narrow-based and steady, able to tandem walk adequately.  Romberg negative.   IMPRESSION: This is a 57 yo RH woman with a history of hypertension, migraines, and left temporal lobe epilepsy. MRI brain unremarkable, EEG showed left anterior temporal polyspikes. She does not recall her last seizure, there is an ER visit in 07/2022 for confusion with fall, Depakote level undetectable. She reports improved compliance with medications, continue Depakote 500mg  BID and Topiramate 100mg  BID. She has prn sumatriptan for migraine rescue. Refills sent. She does not drive. Follow-up in 6 months, call for any changes.     Thank you for allowing me to participate in her care.  Please do not hesitate to call for any questions or concerns.    Patrcia Dolly, M.D.   CC: Dr. Laural Benes

## 2023-05-11 NOTE — Patient Instructions (Signed)
Good to see you! Continue Depakote 500mg : take 1 tablet twice a day. Continue Topamax 50mg : take 2 tablets twice a day. Refills also sent for sumatriptan as needed. Follow-up in 6 months, call for any changes   Seizure Precautions: 1. If medication has been prescribed for you to prevent seizures, take it exactly as directed.  Do not stop taking the medicine without talking to your doctor first, even if you have not had a seizure in a long time.   2. Avoid activities in which a seizure would cause danger to yourself or to others.  Don't operate dangerous machinery, swim alone, or climb in high or dangerous places, such as on ladders, roofs, or girders.  Do not drive unless your doctor says you may.  3. If you have any warning that you may have a seizure, lay down in a safe place where you can't hurt yourself.    4.  No driving for 6 months from last seizure, as per [facility].   Please refer to the following link on the Epilepsy Foundation of America's website for more information: http://www.epilepsyfoundation.org/answerplace/Social/driving/drivingu.cfm   5.  Maintain good sleep hygiene. Avoid alcohol.  6.  Contact your doctor if you have any problems that may be related to the medicine you are taking.  7.  Call 911 and bring the patient back to the ED if:        A.  The seizure lasts longer than 5 minutes.       B.  The patient doesn't awaken shortly after the seizure  C.  The patient has new problems such as difficulty seeing, speaking or moving  D.  The patient was injured during the seizure  E.  The patient has a temperature over 102 F (39C)  F.  The patient vomited and now is having trouble breathing

## 2023-05-18 ENCOUNTER — Ambulatory Visit
Admission: RE | Admit: 2023-05-18 | Discharge: 2023-05-18 | Disposition: A | Discharge status: Home / Self Care | Payer: Medicaid Other | Source: Ambulatory Visit | Attending: Internal Medicine | Admitting: Internal Medicine

## 2023-05-18 DIAGNOSIS — Z1231 Encounter for screening mammogram for malignant neoplasm of breast: Secondary | ICD-10-CM

## 2023-05-25 ENCOUNTER — Inpatient Hospital Stay: Admission: RE | Admit: 2023-05-25 | Payer: BLUE CROSS/BLUE SHIELD | Source: Ambulatory Visit

## 2023-05-28 ENCOUNTER — Other Ambulatory Visit: Payer: Self-pay

## 2023-06-01 ENCOUNTER — Ambulatory Visit: Payer: BLUE CROSS/BLUE SHIELD | Attending: Internal Medicine | Admitting: Internal Medicine

## 2023-06-01 ENCOUNTER — Other Ambulatory Visit: Payer: Self-pay

## 2023-06-01 ENCOUNTER — Other Ambulatory Visit (HOSPITAL_COMMUNITY)
Admission: RE | Admit: 2023-06-01 | Discharge: 2023-06-01 | Disposition: A | Discharge status: Home / Self Care | Payer: BLUE CROSS/BLUE SHIELD | Source: Ambulatory Visit | Attending: Internal Medicine | Admitting: Internal Medicine

## 2023-06-01 ENCOUNTER — Encounter: Payer: Self-pay | Admitting: Internal Medicine

## 2023-06-01 VITALS — BP 114/81 | HR 41 | Temp 97.8°F | Ht 63.0 in | Wt 138.0 lb

## 2023-06-01 DIAGNOSIS — Z124 Encounter for screening for malignant neoplasm of cervix: Secondary | ICD-10-CM | POA: Diagnosis not present

## 2023-06-01 NOTE — Progress Notes (Signed)
Patient ID: Tammy [name], female    DOB: 09/28/1966  MRN: [id number]  CC: Gynecologic Exam (Pap. Med refills. /No to shingles vax. )   Subjective: Tammy [name] is a 57 y.o. 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 NA Menstrual flow light or heavy?: NA Method of birth control?:  NA Any vaginal dischg at this time?: no Dysuria?: no Any hx of STI?: yes but does not recall what it was but treated Sexually active with how many partners: currently not active Desires STI screen: yes Last MMG: 04/2023 Family hx of uterine, cervical or breast cancer?:  not sure.   Patient Active Problem List   Diagnosis Date Noted   Varicose veins of leg with edema, bilateral 02/20/2022   COVID-19 virus infection 12/05/2020   Acute superficial venous thrombosis of lower extremity, left 12/13/2018   Essential hypertension 12/13/2018   Chronic migraine 09/07/2016   Seizure (HCC) 08/20/2016   Asthma 08/20/2016   DVT, HX OF 01/20/2008   FIBROIDS, UTERUS 01/16/2008     Current Outpatient Medications on File Prior to Visit  Medication Sig Dispense Refill   acetaminophen (TYLENOL) 500 MG tablet Take 1 tablet (500 mg total) by mouth every 8 (eight) hours as needed. 60 tablet 1   albuterol (VENTOLIN HFA) 108 (90 Base) MCG/ACT inhaler Inhale 2 puffs into the lungs every 6 (six) hours as needed for wheezing or shortness of breath. 18 g 2   divalproex (DEPAKOTE) 500 MG DR tablet Take 1 tablet (500 mg total) by mouth in the morning AND 1 tablet (500 mg total) at bedtime. 180 tablet 3   losartan (COZAAR) 100 MG  tablet Take 1 tablet (100 mg total) by mouth daily. 90 tablet 1   solifenacin (VESICARE) 5 MG tablet Take 1 tablet (5 mg total) by mouth daily. 30 tablet 4   SUMAtriptan (IMITREX) 50 MG tablet Take 1 tablet at onset of migraine. May repeat in 2 hours if headache persists or recurs. Do not take more than 3 a week 10 tablet 5   topiramate (TOPAMAX) 50 MG tablet Take 2 tablets (100 mg total) by mouth in the morning AND 2 tablets (100 mg total) at bedtime. 360 tablet 3   Vitamin D, Ergocalciferol, (DRISDOL) 1.25 MG (50000 UNIT) CAPS capsule Take 1 capsule (50,000 Units total) by mouth every 7 (seven) days. 16 capsule 0   [DISCONTINUED] lisinopril-hydrochlorothiazide (ZESTORETIC) 10-12.5 MG tablet Take 2 tablets by mouth daily. To lower blood pressure (Patient not taking: Reported on 12/05/2020) 60 tablet 2   No current facility-administered medications on file prior to visit.    Allergies  Allergen Reactions   Dilantin [Phenytoin] Swelling   Aspirin Other (See Comments)    Patient unsure of reaction    Tramadol Itching and Other (See Comments)    Pt states she ins unsure of reaction Pt states she ins unsure of reaction Pt states she ins unsure of reaction Pt states she ins unsure of reaction    Social History   Socioeconomic History   Marital status: Single    Spouse name: Not on file   Number of children: Not on  file   Years of education: Not on file   Highest education level: Not on file  Occupational History   Not on file  Tobacco Use   Smoking status: Never   Smokeless tobacco: Never  Vaping Use   Vaping Use: Never used  Substance and Sexual Activity   Alcohol use: No   Drug use: Yes    Types: Marijuana   Sexual activity: Not on file  Other Topics Concern   Not on file  Social History Narrative   Right handed    Lives with family    Social Determinants of Health   Financial Resource Strain: Not on file  Food Insecurity: Not on file  Transportation Needs: Not on file   Physical Activity: Not on file  Stress: Not on file  Social Connections: Not on file  Intimate Partner Violence: Not on file    Family History  Problem Relation Age of Onset   Heart disease Mother    Migraines Mother    Heart attack Father    Colon cancer Neg Hx     Past Surgical History:  Procedure Laterality Date   HERNIA REPAIR     UTERINE FIBROID SURGERY      ROS: Review of Systems Negative except as stated above  PHYSICAL EXAM: BP 114/81 (BP Location: Left Arm, Patient Position: Sitting, Cuff Size: Normal)   Pulse (!) 41   Temp 97.8 F (36.6 C) (Oral)   Ht 5\' 3"  (1.6 m)   Wt 138 lb (62.6 kg)   LMP 03/08/2016   SpO2 99%   BMI 24.45 kg/m   Physical Exam  General appearance - alert, well appearing, and in no distress Mental status - normal mood, behavior, speech, dress, motor activity, and thought processes Pelvic -CMA Clarisa is present: Normal external genitalia, vulva, vagina, cervix, uterus and adnexa.  Vaginal mucosa is slightly dry.      Latest Ref Rng & Units 04/24/2023    9:21 AM 02/20/2022    3:55 PM 08/24/2021    3:04 PM  CMP  Glucose 70 - 99 mg/dL 80  74  81   BUN 6 - 24 mg/dL 16  14  20    Creatinine 0.57 - 1.00 mg/dL 1.30  8.65  7.84   Sodium 134 - 144 mmol/L 144  140  144   Potassium 3.5 - 5.2 mmol/L 3.7  4.5  4.1   Chloride 96 - 106 mmol/L 108  103  108   CO2 20 - 29 mmol/L 20  23  20    Calcium 8.7 - 10.2 mg/dL 69.6  29.5  9.4   Total Protein 6.0 - 8.5 g/dL  7.3  6.6   Total Bilirubin 0.0 - 1.2 mg/dL  0.3  <2.8   Alkaline Phos 44 - 121 IU/L  43  48   AST 0 - 40 IU/L  16  18   ALT 0 - 32 IU/L  9  6    Lipid Panel     Component Value Date/Time   CHOL 239 (H) 02/20/2022 1555   TRIG 79 02/20/2022 1555   HDL 83 02/20/2022 1555   CHOLHDL 2.9 02/20/2022 1555   LDLCALC 143 (H) 02/20/2022 1555    CBC    Component Value Date/Time   WBC 6.4 02/20/2022 1555   WBC 8.1 05/04/2021 1030   RBC 4.10 02/20/2022 1555   RBC 4.63 05/04/2021 1030    HGB 12.1 02/20/2022 1555   HCT 37.5 02/20/2022 1555   PLT 192  02/20/2022 1555   MCV 92 02/20/2022 1555   MCH 29.5 02/20/2022 1555   MCH 28.5 05/04/2021 1030   MCHC 32.3 02/20/2022 1555   MCHC 31.3 05/04/2021 1030   RDW 12.8 02/20/2022 1555   LYMPHSABS 1.0 05/04/2021 1030   LYMPHSABS 2.5 12/22/2020 1629   MONOABS 0.2 05/04/2021 1030   EOSABS 0.0 05/04/2021 1030   EOSABS 0.1 12/22/2020 1629   BASOSABS 0.0 05/04/2021 1030   BASOSABS 0.0 12/22/2020 1629    ASSESSMENT AND PLAN:  1. Pap smear for cervical cancer screening - Cytology - PAP - Cervicovaginal ancillary only     Patient was given the opportunity to ask questions.  Patient verbalized understanding of the plan and was able to repeat key elements of the plan.   This documentation was completed using Paediatric nurse.  Any transcriptional errors are unintentional.  No orders of the defined types were placed in this encounter.    Requested Prescriptions   Pending Prescriptions Disp Refills   Vitamin D, Ergocalciferol, (DRISDOL) 1.25 MG (50000 UNIT) CAPS capsule 16 capsule 0    Sig: Take 1 capsule (50,000 Units total) by mouth every 7 (seven) days.    No follow-ups on file.  Jonah Blue, MD, FACP

## 2023-06-04 LAB — CERVICOVAGINAL ANCILLARY ONLY
Bacterial Vaginitis (gardnerella): NEGATIVE
Candida Glabrata: NEGATIVE
Candida Vaginitis: NEGATIVE
Chlamydia: NEGATIVE
Comment: NEGATIVE
Comment: NEGATIVE
Comment: NEGATIVE
Comment: NEGATIVE
Comment: NEGATIVE
Comment: NORMAL
Neisseria Gonorrhea: NEGATIVE
Trichomonas: NEGATIVE

## 2023-06-05 ENCOUNTER — Telehealth: Payer: Self-pay

## 2023-06-05 LAB — CYTOLOGY - PAP
Comment: NEGATIVE
Diagnosis: NEGATIVE
Diagnosis: REACTIVE
High risk HPV: NEGATIVE

## 2023-06-05 NOTE — Telephone Encounter (Signed)
Pt returned call for lab results. Shared provider's note.  Marcine Matar, MD 06/04/2023  6:33 PM EDT     Screening for chlamydia, trichomonas and gonorrhea negative.   No questions

## 2023-06-11 ENCOUNTER — Other Ambulatory Visit: Payer: BLUE CROSS/BLUE SHIELD

## 2023-06-20 ENCOUNTER — Other Ambulatory Visit: Payer: Self-pay

## 2023-08-03 NOTE — Progress Notes (Signed)
This encounter was created in error - please disregard.

## 2023-09-07 ENCOUNTER — Other Ambulatory Visit: Payer: Self-pay

## 2023-09-07 ENCOUNTER — Other Ambulatory Visit: Payer: Self-pay | Admitting: Internal Medicine

## 2023-09-07 DIAGNOSIS — I10 Essential (primary) hypertension: Secondary | ICD-10-CM

## 2023-09-07 MED ORDER — LOSARTAN POTASSIUM 100 MG PO TABS
100.0000 mg | ORAL_TABLET | Freq: Every day | ORAL | 0 refills | Status: DC
Start: 2023-09-07 — End: 2024-03-28
  Filled 2023-09-07 – 2024-01-03 (×2): qty 30, 30d supply, fill #0

## 2023-09-19 ENCOUNTER — Other Ambulatory Visit: Payer: Self-pay

## 2023-09-26 ENCOUNTER — Ambulatory Visit: Payer: Self-pay | Admitting: Neurology

## 2023-09-28 ENCOUNTER — Other Ambulatory Visit: Payer: Self-pay

## 2023-10-05 ENCOUNTER — Ambulatory Visit: Payer: BLUE CROSS/BLUE SHIELD | Admitting: Internal Medicine

## 2023-12-07 ENCOUNTER — Ambulatory Visit: Payer: BLUE CROSS/BLUE SHIELD | Admitting: Neurology

## 2023-12-12 ENCOUNTER — Ambulatory Visit (INDEPENDENT_AMBULATORY_CARE_PROVIDER_SITE_OTHER): Payer: MEDICAID | Admitting: Neurology

## 2023-12-12 ENCOUNTER — Encounter: Payer: Self-pay | Admitting: Neurology

## 2023-12-12 ENCOUNTER — Other Ambulatory Visit: Payer: Self-pay

## 2023-12-12 VITALS — BP 131/90 | HR 71 | Ht 63.0 in | Wt 132.0 lb

## 2023-12-12 DIAGNOSIS — G43709 Chronic migraine without aura, not intractable, without status migrainosus: Secondary | ICD-10-CM

## 2023-12-12 DIAGNOSIS — R251 Tremor, unspecified: Secondary | ICD-10-CM

## 2023-12-12 DIAGNOSIS — G40009 Localization-related (focal) (partial) idiopathic epilepsy and epileptic syndromes with seizures of localized onset, not intractable, without status epilepticus: Secondary | ICD-10-CM | POA: Diagnosis not present

## 2023-12-12 MED ORDER — TOPIRAMATE 200 MG PO TABS
200.0000 mg | ORAL_TABLET | Freq: Two times a day (BID) | ORAL | 3 refills | Status: DC
  Filled 2023-12-12 – 2024-01-03 (×2): qty 180, 90d supply, fill #0
  Filled 2024-04-17: qty 180, 90d supply, fill #1
  Filled 2024-07-22: qty 180, 90d supply, fill #0
  Filled 2024-07-22: qty 180, 90d supply, fill #2

## 2023-12-12 MED ORDER — DIVALPROEX SODIUM 500 MG PO DR TAB
500.0000 mg | DELAYED_RELEASE_TABLET | Freq: Every evening | ORAL | 3 refills | Status: DC
  Filled 2023-12-12 – 2024-01-03 (×2): qty 90, 90d supply, fill #0

## 2023-12-12 NOTE — Patient Instructions (Addendum)
 Good to see you.  Increase Topiramate  (Topamax ) to 200mg  twice a day. With your current bottle of Topiramate  50mg : take 4 tablets twice a day. Once done, your new bottle will be for Topiramate  200mg : Take 1 tablet twice a day  2. Reduce Depakote  500mg : Take 1 tablet every night  3. Keep a calendar of the seizures   4. Follow-up in 3 months, call for any changes   Seizure Precautions: 1. If medication has been prescribed for you to prevent seizures, take it exactly as directed.  Do not stop taking the medicine without talking to your doctor first, even if you have not had a seizure in a long time.   2. Avoid activities in which a seizure would cause danger to yourself or to others.  Don't operate dangerous machinery, swim alone, or climb in high or dangerous places, such as on ladders, roofs, or girders.  Do not drive unless your doctor says you may.  3. If you have any warning that you may have a seizure, lay down in a safe place where you can't hurt yourself.    4.  No driving for 6 months from last seizure, as per [place]  state law.   Please refer to the following link on the Epile[employer] of [employer]'s website for more information: http://www.epilepsyfoundation.org/answerplace/Social/driving/drivingu.cfm   5.  Maintain good sleep hygiene. Avoid alcohol.  6.  Contact your doctor if you have any problems that may be related to the medicine you are taking.  7.  Call 911 and bring the patient back to the ED if:        A.  The seizure lasts longer than 5 minutes.       B.  The patient doesn't awaken shortly after the seizure  C.  The patient has new problems such as difficulty seeing, speaking or moving  D.  The patient was injured during the seizure  E.  The patient has a temperature over 102 F (39C)  F.  The patient vomited and now is having trouble breathing

## 2023-12-12 NOTE — Progress Notes (Signed)
 NEUROLOGY FOLLOW UP OFFICE NOTE  Tammy [name] [id number] 31-Mar-1966  HISTORY OF PRESENT ILLNESS: I had the pleasure of seeing Tammy [name] in follow-up in the neurology clinic on 12/12/2023.  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 She is alone in the office today. Records and images were personally reviewed where available.  Since her last visit, she reports the seizures she can remember are a "little mini one" in October, then a bigger one last December where she woke up with bowel incontinence and her grandkids told her she was talking and shaking. She reports all her seizures have been nocturnal. She denies missing medication, sleep deprivation, or alcohol use. She is on Topiramate  100mg  BID (50mg  2 tabs BID) and Depakote  500mg  BID. Migraines are not as much, 2 a month on average with good response to Imitrex . She had more than usual in December (2-3). She reports tremors in both hands, she also shows her leg shaking and states she had been doing this even when she was little, but the hand tremors are new. She endorses a history of PTSD and anxiety. She usually gets 6-8 hours of sleep. She lives alone.    History on Initial Assessment 02/22/2021: This is a 58 year old right-handed woman with a history of hypertension, migraines, complex partial seizures with secondary generalization, presenting to establish care. Seizures started in childhood, she was having frequent passing out episodes. She had a nocturnal seizure in 2010 and was started on Dilantin , lost to follow-up until 2017 when she saw neurologist Dr. Gracie Lav and reported 2-3 seizures every month preceded by lights flashing, bell ringing, then loss of consciousness. Dilantin  apparently caused throat swelling, she was taking Keppra . She was started on Depakote  in 2017 for seizure and migraine prophylaxis. She was admitted to [facility] in January 2022 after she was found unresponsive, with tongue bite and incontinence.  She remained encephalopathic in the ER, then had gaze deviation, not following commands. She was found to be COVID positive. She was given IV Ativan  and Keppra , EEG after showed diffuse slowing with left anterior temporal polyspikes, at times with PLEDs-like appearance. She started returning to baseline around 48 hours later. 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 She was discharged home on Depakote  500mg  BID (which she was taking prior to admission, level 68 on admission) and Levetiracetam  1000mg  BID.  Since hospital discharge, she has been living with her aunt. Prior to this, she was living with a different aunt. Family tries to be with her 24/7, rotating with each other. She reports majority of her seizures are nocturnal, she has 2-3 nocturnal seizures a month. She also reports episodes of confusion, she brings a piece of paper where she wrote down gibberish words when she was asked to write her name and SSN. She has occasional olfactory hallucinations where she smells a stench. When she got of of the hospital last January, she noticed right-sided weakness. She was fine yesterday, but this morning woke up with the left side of her body hurting. She reports sharp pain radiating up to her hip. No falls since January. She denies any numbness/tingling, she has occasional urinary incontinence. She has occasional uncontrollable hand tremors, L>R. Migraines started at age 36, pain usually starts on the left side of her head and radiates diffusely. She would be sensitive to lights and sounds with occasional nausea. She has migraines 2-3 times a week. She used to  take sumatriptan  but felt it increased her appetite. She usually takes Tylenol  which does not help much. She brings a note she wrote in 2012 about her need to see a doctor about her feelings, not just to push pills but to listen to her. She notes depression and is tearful several times in the office  today. She reports the medications make her tired, make her sad. She makes herself get up, otherwise she cannot do anything. Since January, she can't even cook for herself anymore. She works for [employer] in [place] for a week every few months. She does not drive. She reports a significant amount of weight loss and points to her goiter. She denies any dizziness, she has occasional neck pain and double vision. She reports left calf swelling and left hip pain.  Epilepsy Risk Factors:  Her grandson has seizures. 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 There is no history of febrile convulsions, CNS infections such as meningitis/encephalitis, significant traumatic brain injury, neurosurgical procedures.  Prior AEDs: Dilantin , Keppra   Diagnostic Data: EEGs: 11/2020 prolonged EEG showed polyspikes in the left anterior temporal region MRI: MRI brain with and without contrast in 11/2020 no acute changes, there was mild chronic microvascular disease, hippocampi symmetric with no abnormal signal or enhancement  PAST MEDICAL HISTORY: Past Medical History:  Diagnosis Date   Asthma    Blood transfusion without reported diagnosis    'a long time ago"   GERD (gastroesophageal reflux disease)    Headache(784.0)    Hypertension    Seizures (HCC)     MEDICATIONS: Current Outpatient Medications on File Prior to Visit  Medication Sig Dispense Refill   acetaminophen  (TYLENOL ) 500 MG tablet Take 1 tablet (500 mg total) by mouth every 8 (eight) hours as needed. 60 tablet 1   albuterol  (VENTOLIN  HFA) 108 (90 Base) MCG/ACT inhaler Inhale 2 puffs into the lungs every 6 (six) hours as needed for wheezing or shortness of breath. 18 g 2   divalproex  (DEPAKOTE ) 500 MG DR tablet Take 1 tablet (500 mg total) by mouth in the morning AND 1 tablet (500 mg total) at bedtime. 180 tablet 3   SUMAtriptan  (IMITREX )  50 MG tablet Take 1 tablet at onset of migraine. May repeat in 2 hours if headache persists or recurs. Do not take more than 3 a week 10 tablet 5   topiramate  (TOPAMAX ) 50 MG tablet Take 2 tablets (100 mg total) by mouth in the morning AND 2 tablets (100 mg total) at bedtime. 360 tablet 3   Vitamin D , Ergocalciferol , (DRISDOL ) 1.25 MG (50000 UNIT) CAPS capsule Take 1 capsule (50,000 Units total) by mouth every 7 (seven) days. 16 capsule 0   losartan  (COZAAR ) 100 MG tablet Take 1 tablet (100 mg total) by mouth daily. 30 tablet 0   solifenacin  (VESICARE ) 5 MG tablet Take 1 tablet (5 mg total) by mouth daily. 30 tablet 4   [DISCONTINUED] lisinopril -hydrochlorothiazide  (ZESTORETIC ) 10-12.5 MG tablet Take 2 tablets by mouth daily. To lower blood pressure (Patient not taking: Reported on 12/05/2020) 60 tablet 2   No current facility-administered medications on file prior to visit.    ALLERGIES: Allergies  Allergen Reactions   Dilantin  [Phenytoin ] Swelling   Aspirin Other (See Comments)    Patient unsure of reaction    Tramadol Itching and Other (See Comments)    Pt  states she ins unsure of reaction Pt states she ins unsure of reaction Pt states she ins unsure of reaction Pt states she ins unsure of reaction    FAMILY HISTORY: Family History  Problem Relation Age of Onset   Heart disease Mother    Migraines Mother    Heart attack Father    Colon cancer Neg Hx     SOCIAL HISTORY: Social History   Socioeconomic History   Marital status: Single    Spouse name: Not on file   Number of children: Not on file   Years of education: Not on file   Highest education level: Not on file  Occupational History   Not on file  Tobacco Use   Smoking status: Never   Smokeless tobacco: Never  Vaping Use   Vaping status: Never Used  Substance and Sexual Activity   Alcohol use: No   Drug use: Yes    Types: Marijuana   Sexual activity: Not on file  Other Topics Concern   Not on file  Social  History Narrative   Right handed    Lives with family    Social Drivers of Health   Financial Resource Strain: Not on file  Food Insecurity: Not on file  Transportation Needs: Not on file  Physical Activity: Not on file  Stress: Not on file  Social Connections: Not on file  Intimate Partner Violence: Not on file     PHYSICAL EXAM: Vitals:   12/12/23 1159 12/12/23 1203  BP: (!) 158/96 (!) 131/90  Pulse: 71   SpO2: 100%    General: No acute distress Head:  Normocephalic/atraumatic Skin/Extremities: No rash, no edema Neurological Exam: alert and awake. No aphasia or dysarthria. Fund of knowledge is appropriate. Attention and concentration are normal.   Cranial nerves: Pupils equal, round. Extraocular movements intact with no nystagmus. Visual fields full.  No facial asymmetry.  Motor: Bulk and tone normal, muscle strength 5/5 throughout with no pronator drift.   Finger to nose testing intact.  Gait slow and cautious with knees bent, denies any pain. No resting, postural or action tremor on exam. When she shows her hands shaking or when holding a piece of paper, there is a coarse tremor seen.    IMPRESSION: This is a 58 yo RH woman with a history of hypertension, migraines, and left temporal lobe epilepsy. MRI brain unremarkable, EEG showed left anterior temporal polyspikes. She continues to report seizures on current regimen, as well as new hand tremors that are not seen on exam but when patient lifts hands to show tremors, they are coarse, possibly anxiety-related. Discussed increasing Topiramate  to 200mg  BID as this can help with tremors as well. Reduce Depakote  to 500mg  at bedtime. She has prn Imitrex  for migraine rescue. She is aware of [place] driving laws to stop driving after a seizure until 6 months seizure-free. Discuss anxiety with PCP. Follow-up in 3 months, call for any changes.    Thank you for allowing me to participate in her care.  Please do not hesitate to call for any  questions or concerns.    Rayfield Cairo, M.D.   CC: Dr. Lincoln Renshaw

## 2023-12-21 ENCOUNTER — Other Ambulatory Visit: Payer: Self-pay

## 2023-12-27 ENCOUNTER — Ambulatory Visit: Payer: MEDICAID | Admitting: Internal Medicine

## 2024-01-03 ENCOUNTER — Other Ambulatory Visit: Payer: Self-pay

## 2024-01-11 ENCOUNTER — Other Ambulatory Visit (HOSPITAL_COMMUNITY): Payer: Self-pay

## 2024-01-14 ENCOUNTER — Other Ambulatory Visit: Payer: Self-pay

## 2024-01-27 DIAGNOSIS — I499 Cardiac arrhythmia, unspecified: Secondary | ICD-10-CM | POA: Diagnosis not present

## 2024-01-27 DIAGNOSIS — Z823 Family history of stroke: Secondary | ICD-10-CM | POA: Diagnosis not present

## 2024-01-27 DIAGNOSIS — I10 Essential (primary) hypertension: Secondary | ICD-10-CM | POA: Diagnosis not present

## 2024-01-27 DIAGNOSIS — Z5982 Transportation insecurity: Secondary | ICD-10-CM | POA: Diagnosis not present

## 2024-01-27 DIAGNOSIS — Z720 Tobacco use: Secondary | ICD-10-CM | POA: Diagnosis not present

## 2024-01-27 DIAGNOSIS — Z8249 Family history of ischemic heart disease and other diseases of the circulatory system: Secondary | ICD-10-CM | POA: Diagnosis not present

## 2024-01-27 DIAGNOSIS — Z886 Allergy status to analgesic agent status: Secondary | ICD-10-CM | POA: Diagnosis not present

## 2024-01-27 DIAGNOSIS — Z818 Family history of other mental and behavioral disorders: Secondary | ICD-10-CM | POA: Diagnosis not present

## 2024-01-27 DIAGNOSIS — R32 Unspecified urinary incontinence: Secondary | ICD-10-CM | POA: Diagnosis not present

## 2024-01-27 DIAGNOSIS — G40909 Epilepsy, unspecified, not intractable, without status epilepticus: Secondary | ICD-10-CM | POA: Diagnosis not present

## 2024-01-31 ENCOUNTER — Encounter: Payer: 59 | Admitting: Internal Medicine

## 2024-02-01 ENCOUNTER — Emergency Department (HOSPITAL_COMMUNITY)

## 2024-02-01 ENCOUNTER — Inpatient Hospital Stay (HOSPITAL_COMMUNITY)
Admission: EM | Admit: 2024-02-01 | Discharge: 2024-02-06 | DRG: 101 | Disposition: A | Discharge status: Home Health | Attending: Family Medicine | Admitting: Family Medicine

## 2024-02-01 ENCOUNTER — Inpatient Hospital Stay (HOSPITAL_COMMUNITY)

## 2024-02-01 ENCOUNTER — Other Ambulatory Visit: Payer: Self-pay

## 2024-02-01 ENCOUNTER — Encounter (HOSPITAL_COMMUNITY): Payer: Self-pay

## 2024-02-01 ENCOUNTER — Emergency Department (HOSPITAL_COMMUNITY)
Admission: EM | Admit: 2024-02-01 | Discharge: 2024-02-01 | Disposition: A | Discharge status: Home / Self Care | Source: Home / Self Care | Attending: Emergency Medicine | Admitting: Emergency Medicine

## 2024-02-01 DIAGNOSIS — Z91014 Allergy to mammalian meats: Secondary | ICD-10-CM

## 2024-02-01 DIAGNOSIS — Z888 Allergy status to other drugs, medicaments and biological substances status: Secondary | ICD-10-CM

## 2024-02-01 DIAGNOSIS — Z885 Allergy status to narcotic agent status: Secondary | ICD-10-CM | POA: Diagnosis not present

## 2024-02-01 DIAGNOSIS — R000 Tachycardia, unspecified: Secondary | ICD-10-CM | POA: Diagnosis not present

## 2024-02-01 DIAGNOSIS — G9340 Encephalopathy, unspecified: Secondary | ICD-10-CM | POA: Diagnosis not present

## 2024-02-01 DIAGNOSIS — G40909 Epilepsy, unspecified, not intractable, without status epilepticus: Secondary | ICD-10-CM | POA: Insufficient documentation

## 2024-02-01 DIAGNOSIS — M6282 Rhabdomyolysis: Secondary | ICD-10-CM | POA: Diagnosis present

## 2024-02-01 DIAGNOSIS — Z91148 Patient's other noncompliance with medication regimen for other reason: Secondary | ICD-10-CM | POA: Diagnosis not present

## 2024-02-01 DIAGNOSIS — Z8249 Family history of ischemic heart disease and other diseases of the circulatory system: Secondary | ICD-10-CM

## 2024-02-01 DIAGNOSIS — Z79899 Other long term (current) drug therapy: Secondary | ICD-10-CM | POA: Insufficient documentation

## 2024-02-01 DIAGNOSIS — G40111 Localization-related (focal) (partial) symptomatic epilepsy and epileptic syndromes with simple partial seizures, intractable, with status epilepticus: Secondary | ICD-10-CM | POA: Diagnosis not present

## 2024-02-01 DIAGNOSIS — R404 Transient alteration of awareness: Secondary | ICD-10-CM | POA: Diagnosis not present

## 2024-02-01 DIAGNOSIS — I10 Essential (primary) hypertension: Secondary | ICD-10-CM | POA: Insufficient documentation

## 2024-02-01 DIAGNOSIS — G40901 Epilepsy, unspecified, not intractable, with status epilepticus: Secondary | ICD-10-CM | POA: Diagnosis not present

## 2024-02-01 DIAGNOSIS — K219 Gastro-esophageal reflux disease without esophagitis: Secondary | ICD-10-CM | POA: Diagnosis present

## 2024-02-01 DIAGNOSIS — R569 Unspecified convulsions: Principal | ICD-10-CM

## 2024-02-01 DIAGNOSIS — J45909 Unspecified asthma, uncomplicated: Secondary | ICD-10-CM | POA: Diagnosis present

## 2024-02-01 DIAGNOSIS — G40101 Localization-related (focal) (partial) symptomatic epilepsy and epileptic syndromes with simple partial seizures, not intractable, with status epilepticus: Secondary | ICD-10-CM | POA: Diagnosis not present

## 2024-02-01 DIAGNOSIS — Z886 Allergy status to analgesic agent status: Secondary | ICD-10-CM

## 2024-02-01 DIAGNOSIS — R4182 Altered mental status, unspecified: Secondary | ICD-10-CM | POA: Diagnosis not present

## 2024-02-01 DIAGNOSIS — I493 Ventricular premature depolarization: Secondary | ICD-10-CM | POA: Diagnosis not present

## 2024-02-01 DIAGNOSIS — Z0389 Encounter for observation for other suspected diseases and conditions ruled out: Secondary | ICD-10-CM | POA: Diagnosis not present

## 2024-02-01 DIAGNOSIS — R9431 Abnormal electrocardiogram [ECG] [EKG]: Secondary | ICD-10-CM | POA: Diagnosis not present

## 2024-02-01 DIAGNOSIS — R531 Weakness: Secondary | ICD-10-CM | POA: Diagnosis not present

## 2024-02-01 DIAGNOSIS — Z789 Other specified health status: Secondary | ICD-10-CM

## 2024-02-01 LAB — CBC
HCT: 46.2 % — ABNORMAL HIGH (ref 36.0–46.0)
Hemoglobin: 14.2 g/dL (ref 12.0–15.0)
MCH: 28.8 pg (ref 26.0–34.0)
MCHC: 30.7 g/dL (ref 30.0–36.0)
MCV: 93.7 fL (ref 80.0–100.0)
Platelets: 278 10*3/uL (ref 150–400)
RBC: 4.93 MIL/uL (ref 3.87–5.11)
RDW: 13.7 % (ref 11.5–15.5)
WBC: 7.7 10*3/uL (ref 4.0–10.5)
nRBC: 0 % (ref 0.0–0.2)

## 2024-02-01 LAB — RESP PANEL BY RT-PCR (RSV, FLU A&B, COVID)  RVPGX2
Influenza A by PCR: NEGATIVE
Influenza B by PCR: NEGATIVE
Resp Syncytial Virus by PCR: NEGATIVE
SARS Coronavirus 2 by RT PCR: NEGATIVE

## 2024-02-01 LAB — COMPREHENSIVE METABOLIC PANEL
ALT: 7 U/L (ref 0–44)
AST: 15 U/L (ref 15–41)
Albumin: 3.4 g/dL — ABNORMAL LOW (ref 3.5–5.0)
Alkaline Phosphatase: 39 U/L (ref 38–126)
Anion gap: 8 (ref 5–15)
BUN: 14 mg/dL (ref 6–20)
CO2: 23 mmol/L (ref 22–32)
Calcium: 9 mg/dL (ref 8.9–10.3)
Chloride: 109 mmol/L (ref 98–111)
Creatinine, Ser: 0.83 mg/dL (ref 0.44–1.00)
GFR, Estimated: >60 mL/min (ref >60)
Glucose, Bld: 93 mg/dL (ref 70–99)
Potassium: 3.6 mmol/L (ref 3.5–5.1)
Sodium: 140 mmol/L (ref 135–145)
Total Bilirubin: 0.3 mg/dL (ref 0.0–1.2)
Total Protein: 6 g/dL — ABNORMAL LOW (ref 6.5–8.1)

## 2024-02-01 LAB — CBG MONITORING, ED
Glucose-Capillary: 95 mg/dL (ref 70–99)
Glucose-Capillary: 103 mg/dL — ABNORMAL HIGH (ref 70–99)

## 2024-02-01 LAB — BASIC METABOLIC PANEL
Anion gap: 19 — ABNORMAL HIGH (ref 5–15)
BUN: 14 mg/dL (ref 6–20)
CO2: 15 mmol/L — ABNORMAL LOW (ref 22–32)
Calcium: 9.7 mg/dL (ref 8.9–10.3)
Chloride: 105 mmol/L (ref 98–111)
Creatinine, Ser: 1.01 mg/dL — ABNORMAL HIGH (ref 0.44–1.00)
GFR, Estimated: >60 mL/min (ref >60)
Glucose, Bld: 129 mg/dL — ABNORMAL HIGH (ref 70–99)
Potassium: 3.4 mmol/L — ABNORMAL LOW (ref 3.5–5.1)
Sodium: 139 mmol/L (ref 135–145)

## 2024-02-01 LAB — GLUCOSE, CAPILLARY
Glucose-Capillary: 124 mg/dL — ABNORMAL HIGH (ref 70–99)
Glucose-Capillary: 134 mg/dL — ABNORMAL HIGH (ref 70–99)

## 2024-02-01 LAB — CBC WITH DIFFERENTIAL/PLATELET
Abs Immature Granulocytes: 0.02 10*3/uL (ref 0.00–0.07)
Basophils Absolute: 0 10*3/uL (ref 0.0–0.1)
Basophils Relative: 1 %
Eosinophils Absolute: 0.1 10*3/uL (ref 0.0–0.5)
Eosinophils Relative: 1 %
HCT: 37.5 % (ref 36.0–46.0)
Hemoglobin: 12 g/dL (ref 12.0–15.0)
Immature Granulocytes: 0 %
Lymphocytes Relative: 27 %
Lymphs Abs: 1.5 10*3/uL (ref 0.7–4.0)
MCH: 28.9 pg (ref 26.0–34.0)
MCHC: 32 g/dL (ref 30.0–36.0)
MCV: 90.4 fL (ref 80.0–100.0)
Monocytes Absolute: 0.4 10*3/uL (ref 0.1–1.0)
Monocytes Relative: 7 %
Neutro Abs: 3.6 10*3/uL (ref 1.7–7.7)
Neutrophils Relative %: 64 %
Platelets: 233 10*3/uL (ref 150–400)
RBC: 4.15 MIL/uL (ref 3.87–5.11)
RDW: 13.7 % (ref 11.5–15.5)
WBC: 5.7 10*3/uL (ref 4.0–10.5)
nRBC: 0 % (ref 0.0–0.2)

## 2024-02-01 LAB — CK: Total CK: 338 U/L — ABNORMAL HIGH (ref 38–234)

## 2024-02-01 LAB — VALPROIC ACID LEVEL
Valproic Acid Lvl: 31 ug/mL — ABNORMAL LOW (ref 50.0–100.0)
Valproic Acid Lvl: 27 ug/mL — ABNORMAL LOW (ref 50.0–100.0)

## 2024-02-01 MED ORDER — VALPROATE SODIUM 100 MG/ML IV SOLN
1500.0000 mg | Freq: Once | INTRAVENOUS | Status: AC
  Administered 2024-02-01: 1500 mg via INTRAVENOUS
  Filled 2024-02-01: qty 15

## 2024-02-01 MED ORDER — LEVETIRACETAM IN NACL 500 MG/100ML IV SOLN
500.0000 mg | Freq: Once | INTRAVENOUS | Status: AC
  Administered 2024-02-01: 500 mg via INTRAVENOUS
  Filled 2024-02-01: qty 100

## 2024-02-01 MED ORDER — LORAZEPAM 2 MG/ML IJ SOLN
INTRAMUSCULAR | Status: AC
  Administered 2024-02-01: 2 mg via INTRAVENOUS
  Filled 2024-02-01: qty 1

## 2024-02-01 MED ORDER — SODIUM CHLORIDE 0.9% FLUSH
3.0000 mL | Freq: Two times a day (BID) | INTRAVENOUS | Status: DC
  Administered 2024-02-02 – 2024-02-06 (×8): 3 mL via INTRAVENOUS

## 2024-02-01 MED ORDER — LEVETIRACETAM IN NACL 1500 MG/100ML IV SOLN
1500.0000 mg | Freq: Two times a day (BID) | INTRAVENOUS | Status: DC
  Administered 2024-02-01 – 2024-02-04 (×6): 1500 mg via INTRAVENOUS
  Filled 2024-02-01 (×7): qty 100

## 2024-02-01 MED ORDER — LORAZEPAM 2 MG/ML IJ SOLN: 2.0000 mg | INTRAMUSCULAR | Status: AC

## 2024-02-01 MED ORDER — VALPROATE SODIUM 100 MG/ML IV SOLN
500.0000 mg | Freq: Three times a day (TID) | INTRAVENOUS | Status: DC
  Administered 2024-02-02 – 2024-02-04 (×7): 500 mg via INTRAVENOUS
  Filled 2024-02-01 (×9): qty 5

## 2024-02-01 MED ORDER — LEVETIRACETAM IN NACL 1500 MG/100ML IV SOLN: 1500.0000 mg | Freq: Once | INTRAVENOUS | Status: DC

## 2024-02-01 MED ORDER — SODIUM CHLORIDE 0.9 % IV SOLN
2000.0000 mg | INTRAVENOUS | Status: DC
  Filled 2024-02-01: qty 20

## 2024-02-01 MED ORDER — LORAZEPAM 2 MG/ML IJ SOLN: 2.0000 mg | INTRAMUSCULAR | Status: DC | PRN

## 2024-02-01 MED ORDER — POTASSIUM CHLORIDE 10 MEQ/100ML IV SOLN
10.0000 meq | INTRAVENOUS | Status: DC
Start: 2024-02-01 — End: 2024-02-01
  Administered 2024-02-01: 10 meq via INTRAVENOUS
  Filled 2024-02-01 (×2): qty 100

## 2024-02-01 MED ORDER — ONDANSETRON HCL 4 MG/2ML IJ SOLN
4.0000 mg | Freq: Once | INTRAMUSCULAR | Status: AC
  Administered 2024-02-01: 4 mg via INTRAVENOUS

## 2024-02-01 MED ORDER — LEVETIRACETAM IN NACL 1000 MG/100ML IV SOLN: 200.0000 mg | Freq: Once | INTRAVENOUS | Status: DC

## 2024-02-01 MED ORDER — LEVETIRACETAM IN NACL 1000 MG/100ML IV SOLN
1000.0000 mg | Freq: Once | INTRAVENOUS | Status: AC
  Administered 2024-02-01: 1000 mg via INTRAVENOUS
  Filled 2024-02-01: qty 100

## 2024-02-01 MED ORDER — ONDANSETRON HCL 4 MG/2ML IJ SOLN
INTRAMUSCULAR | Status: AC
Start: 2024-02-01 — End: 2024-02-02
  Filled 2024-02-01: qty 2

## 2024-02-01 MED ORDER — LEVETIRACETAM IN NACL 1000 MG/100ML IV SOLN
2000.0000 mg | Freq: Once | INTRAVENOUS | Status: AC
  Administered 2024-02-01: 2000 mg via INTRAVENOUS

## 2024-02-01 MED ORDER — POTASSIUM CHLORIDE 10 MEQ/100ML IV SOLN
10.0000 meq | INTRAVENOUS | Status: AC
  Administered 2024-02-02 (×3): 10 meq via INTRAVENOUS
  Filled 2024-02-01 (×2): qty 100

## 2024-02-01 MED ORDER — ENOXAPARIN SODIUM 40 MG/0.4ML IJ SOSY
40.0000 mg | PREFILLED_SYRINGE | INTRAMUSCULAR | Status: DC
  Administered 2024-02-02 – 2024-02-06 (×5): 40 mg via SUBCUTANEOUS
  Filled 2024-02-01 (×5): qty 0.4

## 2024-02-01 NOTE — ED Triage Notes (Signed)
 Pt BIB [facility] from home. Family reports pt started to have AMS, not responding appropriately to questions and was confused. Family reports pt is usually like this before she has a seizure. With EMS on the truck pt started to have seizure like activity, both arms tense/spasms. EMS gave 5mg  midazolam IM.  EMS 76 P 98% RA 114/78BP  22R 18 LFA

## 2024-02-01 NOTE — Progress Notes (Signed)
 Received phone call from ED nursing staff unable to transport EEG machine to inpt room  on 3W.  EEG tech currently providing stat patient care elsewhere . Tech will move the machine as time allows.  Patient study will not be recorded during this time.

## 2024-02-01 NOTE — ED Notes (Signed)
 Pt changed, non cooperative d/t AMS. Pt had large episode of urination and small bowel movement

## 2024-02-01 NOTE — ED Notes (Signed)
Pt ambulated with little assistance.  

## 2024-02-01 NOTE — H&P (Addendum)
 Hospital Admission History and Physical Service Pager: [phone]  Patient name: Tammy [name] Medical record number: [id number] Date of Birth: 1966-08-09 Age: 58 y.o. Gender: female  Primary Care Provider: Marcine Matar, MD Consultants: Neurology Code Status: Full, which was unable to be confirmed with patient at bedside given altered mental status, will follow-up collateral or with patient when improved Preferred Emergency Contact: Contact Information     Name Relation Home Work Mobile   [place] Son   [phone]   [place], [place]   [phone]      Other Contacts   None on File     Chief Complaint: Seizures  Assessment and Plan: Tammy [name] is a 58 y.o. female with PMH left temporal lobe epilepsy presenting with altered mental status following at least 4 seizures in 24 hours.  Differential for etiology of the seizures includes medication nonadherence and toxic ingestion.  These are likely breakthrough seizures given her underlying seizure disorder.  Multiple factors can decrease seizure threshold including illness, stress, medication adherence, illicit substances.  Will discuss these factors with patient as she awakens.  During prior ED visit yesterday, patient did reportedly identify stress as primary etiology.  Given her subtherapeutic valproate level, favor medication nonadherence as probable etiology of these episodes.  However, unable to verify with family thus far family to call collateral again tomorrow or during night shift.  Toxic ingestion possible, though no evidence to demonstrate this is the case.  Will obtain UDS.  No evidence of acute illness given normal leukocytes, no fevers.  Given patient's prolonged postictal period, altered mental status there was concern for status epilepticus was confirmed by neurology once repeat seizures were witnessed.  Suspect patient has been in and out of seizures intermittently.  Following neurology lead  on loading of AEDs and further therapy for current management of seizures. Assessment & Plan Seizures (HCC) Home regimen Depakote 100 mg QHS, topiramate 200 mg BID.  Unable to reach family to verify medication adherence, will follow-up tomorrow.  Valproate level subtherapeutic. -Admit to FMTS, attending Dr. Jennette Kettle, Progressive unit -Q2h neurochecks -Lorazepam 2 mg PRN for seizures lasting >5 minutes -Seizure precautions -NPO while altered, SLP consult when improving -Continuous cardiac monitoring, continuous pulse ox -Neurology consult, appreciate assistance, follow-up further recommendations: -Keppra load with 3.5 g -EEG until discontinued Encephalopathy Likely postictal period with recurrent intermittent seizures.  Other differentials above. -Delirium precautions, bladder scan x1 -UDS -CXR -Follow-up UA, though not necessarily indicated for acutely altered patient without concern for urinary pathology -TSH, A1c -KCl IV 10 mg mEq x 4 runs -Seizure management as above Hypertension Normotensive initially until seizures, not tolerating PO. -Losartan 100 mg held  Chronic and stable: GERD: No meds. Asthma: Albuterol inhaler Q6h PRN  FEN/GI: NPO while altered, later SLP eval VTE Prophylaxis: Lovenox  Disposition: Progressive  History of Present Illness: Tammy [name] is a 58 y.o. female with a pertinent PMH of left temporal epilepsy presenting with altered mental status status post multiple seizures.  Patient was initially seen late last night/early this morning in ED for altered mental status in the setting of seizure, received 5 mg midazolam en route to ED.  Seizures resolved, improved with observation.  Patient returned to baseline and was able to speak.  Reported increased stress which she suspects is a trigger for her seizures.  Discharged home with precautions.  She received 5 mg of IM Versed by EMS with cessation of her seizure within 2 to 3 minutes.  This afternoon,  patient returned to the ED via EMS.  She was again found seizing by EMS and received 5 mg midazolam en route.  Obtunded on arrival.  CK moderately elevated.  Valproate subtherapeutic.  RPP negative.  Mild electrolyte derangements.  CT head unremarkable for acute pathology.  Neurology consulted and loaded with Keppra 1500 mg.  Reportedly 4 seizures total today prior to ED arrival.  Seizures described as myoclonic with eye deviation.  FMTS consulted for admission for seizure management and also mental status.  On FMTS evaluation, patient remained obtunded and was unable to provide history.  Did call collateral per phone note, unable to reach listed contacts and will try again later.  Update ~1700: Notified that per Neurology, patient developed status epilepticus with leftward gaze deviation not crossing midline, subtle nystagmus and clonus of L foot.  Improved s/p 2 mg lorazepam x1.  Keppra loading to 3.5 g total, STAT EEG ordered.  Review Of Systems: Per HPI.  Pertinent Past Medical History: -Left temporal epilepsy  Remainder reviewed in history tab.   Pertinent Past Surgical History: -Hernia repair -Uterine fibroid surgery  Remainder reviewed in history tab.  Pertinent Social History: Tobacco use: Unknown Alcohol use: Unknown Other Substance use: Unknown Lives with unknown.  Pertinent Family History: -Not obtained  Remainder reviewed in history tab.   Important Outpatient Medications: -Losartan 100 mg -Depakote 500 mg QHS -Topiramate 200 mg BID -Albuterol inhaler Q6h PRN  Remainder reviewed in medication history.   Objective: BP (!) 117/84   Pulse 84   Temp 99 F (37.2 C) (Axillary)   Resp 14   LMP 03/08/2016   SpO2 99%   Physical Exam: General: Obtunded adult female, NAD, fetal position in bed.  Resist being moved. Eyes: PERRLA.  No scleral icterus or injections. ENTM: MMM. Neck: No palpable cervical LAD. Cardiovascular: RRR.  Normal S1/S2.  No  murmur/rub/gallops. Respiratory: Normal work of breathing on room air.  CTAB. Gastrointestinal: Nondistended, soft. Extremities: Capillary refill <2 seconds. Derm: No rashes or skin breakdown on back, arms, legs.  Neurological examination: MS: Intermittently coughs, does not respond with speech, unable to answer questions, does not follow instructions.  Response to painful stimuli and some touch.  Not alert or oriented. Cranial nerves:    CN II:  PERRLA.  CNs III, IV, VI:  Possible leftward gaze deviation, no nystagmus. CN V:  Unable to assess.  CN VII:  No facial weakness or asymmetry.  CN VIII:  Unable to assess. CNs XI/X: Unable to assess. CN XI: Unable to assess. CN XII: Unable to assess. MOTOR: Pushes back against exam.  Strength 5/5 RUE, 5/5 LUE, 5/5 RLE, 5/5 LLE. Plantar responses flexor bilaterally, no clonus noted. SENSATION: Unable to assess. GAIT: Unable to assess.   Labs:  CBC BMET  Recent Labs  Lab 02/01/24 1236  WBC 7.7  HGB 14.2  HCT 46.2*  PLT 278   Recent Labs  Lab 02/01/24 1236  NA 139  K 3.4*  CL 105  CO2 15*  BUN 14  CREATININE 1.01*  GLUCOSE 129*  CALCIUM 9.7     Pertinent additional labs: -A1c, TSH, HIV, UDS pending -Valproate level with subtherapeutic -CK elevated 338  EKG: NSR.  Baseline wander.  QTc 454.  Imaging Studies: -CT head without contrast: Normal brain, no focal lesion or etiology for seizure, small polyp in anterior maxillary sinuses bilaterally.  Independently reviewed and agree with radiologist's interpretation.  Sharion Dove Dimitry, MD 02/01/2024, 5:48 PM PGY-1, [facility]  Upper Level Addendum: I have seen and evaluated this patient along with Dr. Sharion Dove and reviewed the above note, making necessary revisions as appropriate. I agree with the medical decision making and physical exam as noted above. Tiffany Kocher, DO PGY-2 [facility]  FPTS Intern pager: [phone], text pages  welcome Secure chat group [facility] Teaching Service

## 2024-02-01 NOTE — ED Provider Notes (Signed)
 [place] EMERGENCY DEPARTMENT AT [facility] Provider Note   CSN: [id number] Arrival date & time: 02/01/24  0209     History  Chief Complaint  Patient presents with   Seizures   Level 5 caveat due to altered mental status [place] C Sisler is a 58 y.o. female.  The history is provided by the patient.  Patient with history of left temporal lobe epilepsy, hypertension presents from home due to a seizure. EMS gave all the history to nursing staff.  It is reported patient initially had altered mental status that was similar to previous episodes of seizures.  When EMS arrived, they noted that she was having seizure-like activity in her arms.  Patient was given 5 mg midazolam with improvement  No other details are known on arrival   Past Medical History:  Diagnosis Date   Asthma    Blood transfusion without reported diagnosis    'a long time ago"   GERD (gastroesophageal reflux disease)    Headache(784.0)    Hypertension    Seizures (HCC)     Home Medications Prior to Admission medications   Medication Sig Start Date End Date Taking? Authorizing Provider  acetaminophen (TYLENOL) 500 MG tablet Take 1 tablet (500 mg total) by mouth every 8 (eight) hours as needed. 04/09/23   Marcine Matar, MD  albuterol (VENTOLIN HFA) 108 (90 Base) MCG/ACT inhaler Inhale 2 puffs into the lungs every 6 (six) hours as needed for wheezing or shortness of breath. 04/09/23   Marcine Matar, MD  divalproex (DEPAKOTE) 500 MG DR tablet Take 1 tablet (500 mg total) by mouth at bedtime. 12/12/23   Van Clines, MD  losartan (COZAAR) 100 MG tablet Take 1 tablet (100 mg total) by mouth daily. 09/07/23   Marcine Matar, MD  solifenacin (VESICARE) 5 MG tablet Take 1 tablet (5 mg total) by mouth daily. 04/09/23   Marcine Matar, MD  SUMAtriptan (IMITREX) 50 MG tablet Take 1 tablet at onset of migraine. May repeat in 2 hours if headache persists or recurs. Do not take more than 3 a week  05/11/23   Van Clines, MD  topiramate (TOPAMAX) 200 MG tablet Take 1 tablet (200 mg total) by mouth 2 (two) times daily. 12/12/23   Van Clines, MD  Vitamin D, Ergocalciferol, (DRISDOL) 1.25 MG (50000 UNIT) CAPS capsule Take 1 capsule (50,000 Units total) by mouth every 7 (seven) days. 08/25/21   Anders Simmonds, PA-C  lisinopril-hydrochlorothiazide (ZESTORETIC) 10-12.5 MG tablet Take 2 tablets by mouth daily. To lower blood pressure Patient not taking: Reported on 12/05/2020 08/12/20 12/08/20  Rema Fendt, NP      Allergies    Dilantin [phenytoin], Aspirin, and Tramadol    Review of Systems   Review of Systems  Unable to perform ROS: Mental status change    Physical Exam Updated Vital Signs BP (!) 141/96   Pulse 66   Temp 98 F (36.7 C) (Oral)   Resp 16   Ht 1.549 m (5\' 1" )   Wt 59 kg   LMP 03/08/2016   SpO2 100%   BMI 24.56 kg/m  Physical Exam CONSTITUTIONAL: Disheveled, somnolent, smells of marijuana HEAD: Normocephalic/atraumatic, no visible trauma EYES: Pupils are equal and reactive, no nystagmus ENMT: Mucous membranes moist, no facial or oral trauma NECK: supple no meningeal signs SPINE/BACK: No bruising/crepitance/stepoffs noted to spine CV: S1/S2 noted, no murmurs/rubs/gallops noted LUNGS: Lungs are clear to auscultation bilaterally, no apparent distress ABDOMEN: soft,  nondistended NEURO: Pt is somnolent but arousable.  Moves all extremities x 4 EXTREMITIES: pulses normal/equal, full ROM, no deformities SKIN: warm, color normal PSYCH: Unable to assess  ED Results / Procedures / Treatments   Labs (all labs ordered are listed, but only abnormal results are displayed) Labs Reviewed  COMPREHENSIVE METABOLIC PANEL - Abnormal; Notable for the following components:      Result Value   Total Protein 6.0 (*)    Albumin 3.4 (*)    All other components within normal limits  VALPROIC ACID LEVEL - Abnormal; Notable for the following components:   Valproic Acid  Lvl 31 (*)    All other components within normal limits  CBC WITH DIFFERENTIAL/PLATELET  RAPID URINE DRUG SCREEN, HOSP PERFORMED  CBG MONITORING, ED    EKG EKG Interpretation Date/Time:  Friday February 01 2024 02:19:03 EST Ventricular Rate:  70 PR Interval:  164 QRS Duration:  97 QT Interval:  403 QTC Calculation: 435 R Axis:   75  Text Interpretation: Sinus rhythm Low voltage, precordial leads Confirmed by Zadie Rhine ([id number]) on 02/01/2024 2:30:21 AM  Radiology No results found.  Procedures Procedures    Medications Ordered in ED Medications - No data to display  ED Course/ Medical Decision Making/ A&P Clinical Course as of 02/01/24 0553  Caleen Essex Feb 01, 2024  0228 Patient with known history of temporal lobe epilepsy presents for reported seizure activity at home.  She is already been given midazolam and is now sleepy [DW]  0229 Prehospital EKG was reviewed, no acute findings, sinus tachycardia with a rate of 104 [DW]  0245 Temperature noted, warming measures instituted Patient is becoming more alert [DW]  0337 Overall initial labs are unrevealing. Patient drowsy but arousable.  Will continue to monitor [DW]  [phone] Patient starting to wake up, more arousable and answer questions appropriately [DW]  [phone] Patient is feeling improved.  She reports she has been under increased stress recently which typically triggers her seizures.  She takes Topamax and Depakote and has been compliant.  She is seen by neurology.  She has a long history of seizures.  Patient has been ambulatory.  No new seizure activity in the Emergency Department. Patient is otherwise safe for discharge home and follow-up.  Patient already understands seizure precautions and is not not to drive, and avoid bathing alone [DW]    Clinical Course User Index [DW] Zadie Rhine, MD                                 Medical Decision Making Amount and/or Complexity of Data Reviewed Labs:  ordered.           Final Clinical Impression(s) / ED Diagnoses Final diagnoses:  Seizure [facility])    Rx / DC Orders ED Discharge Orders     None         Zadie Rhine, MD 02/01/24 [phone]

## 2024-02-01 NOTE — ED Notes (Signed)
 Left message for son. Daughters phone number is not in service.

## 2024-02-01 NOTE — ED Notes (Signed)
 Patient transported to CT

## 2024-02-01 NOTE — Progress Notes (Signed)
 Still with recurrent seizures on EEG, will load with depakote(home med) and continue keppra/depakote.   Ritta Slot, MD [facility]   If 7pm- 7am, please page neurology on call as listed in AMION.

## 2024-02-01 NOTE — Assessment & Plan Note (Signed)
 Normotensive initially until seizures, not tolerating PO. -Losartan 100 mg held

## 2024-02-01 NOTE — Plan of Care (Signed)
 Attempted to call emergency contacts listed in patient chart for collateral information given patient's inability to communicate at admission. -Tammy [name] (son) called x3, no answer, no VM option -Tammy [name] (daugher) called x1, number not in service, confirmed correct number was called.  Primary team will continue to try to obtain collateral history.

## 2024-02-01 NOTE — Assessment & Plan Note (Signed)
 Home regimen Depakote 100 mg QHS, topiramate 200 mg BID.  Unable to reach family to verify medication adherence, will follow-up tomorrow.  Valproate level subtherapeutic. -Admit to FMTS, attending Dr. Jennette Kettle, Progressive unit -Q2h neurochecks -Lorazepam 2 mg PRN for seizures lasting >5 minutes -Seizure precautions -NPO while altered, SLP consult when improving -Continuous cardiac monitoring, continuous pulse ox -Neurology consult, appreciate assistance, follow-up further recommendations: -Keppra load with 3.5 g -EEG until discontinued

## 2024-02-01 NOTE — Hospital Course (Addendum)
 Tammy [name] is a 58 y.o. female with history of seizure disorder who was admitted for status epilepticus.  Seizure disorder Patient initially presented to the ED on the morning of 02/01/2024 with seizure, was treated with midazolam and sent home in stable good condition.  Patient then arrived evening of 02/01/2024 by EMS for repeat seizure.  She was found to be in status epilepticus and admitted for further workup. GCS 7 initially. Neurology was consulted. Patient was loaded with Keppra and Depakote. Patient required one injection of 2mg  of Ativan for witness seizure. She had long term EEG 3/8-3/10, which showed additional non-clinical seizures which ultimately spaced out and then stopped overnight on 3/10. As such on 3/11 EEG was discontinued. She had brain MRI which showed nonspecific white matter changes that may be seen in the setting of migraine headaches or early chronic small vessel ischemia. Patient mental status improved from presentation, and she was appropriate for discharge with planned f/u with [facility] Neurology.  Other chronic conditions were medically managed with home medications and formulary alternatives as necessary (hypertension).  Follow-up recommendations: 1) follow-up with [place] neurology as scheduled.

## 2024-02-01 NOTE — Assessment & Plan Note (Signed)
 Likely postictal period with recurrent intermittent seizures.  Other differentials above. -Delirium precautions, bladder scan x1 -UDS -CXR -Follow-up UA, though not necessarily indicated for acutely altered patient without concern for urinary pathology -TSH, A1c -KCl IV 10 mg mEq x 4 runs -Seizure management as above

## 2024-02-01 NOTE — ED Triage Notes (Signed)
 Pt BIB [facility] for seizure like activity. Pt seen earlier today for same. Family reported AMS, and EMS witnessed multiple seizures. Pt given 5mg  midazolam IM. Pt having seizure like activity in triage, pt not responsive, gaze deviation to left. Pt suctioned. MD at bedside   EMS: HR 100, 100% RA, BGL 115.

## 2024-02-01 NOTE — ED Provider Notes (Signed)
 [place] EMERGENCY DEPARTMENT AT [facility] Provider Note   CSN: [id number] Arrival date & time: 02/01/24  1223     History  Chief Complaint  Patient presents with   Seizures    Tammy [name] is a 58 y.o. 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  Patient was seen earlier this morning Emergency Department for seizure and treated and discharged back home.  She had recurring seizure activity at home and EMS was called.  They report the patient was postictal on their arrival.  Immediately upon arriving in the ED the patient had generalized tonic-clonic seizure, with shaking of her arms and legs and deviation of her left eyes to the left.  She received 5 mg of IM Versed by EMS with cessation of her seizure within 2 to 3 minutes.  She was postictal afterwards.  From reviewing her medical record she appears to be on Depakote 500 mg ER at bedtime and Topamax 200 mg BID, Topamax.  HPI     Home Medications Prior to Admission medications   Medication Sig Start Date End Date Taking? Authorizing Provider  acetaminophen (TYLENOL) 500 MG tablet Take 1 tablet (500 mg total) by mouth every 8 (eight) hours as needed. 04/09/23  Yes Marcine Matar, MD  albuterol (VENTOLIN HFA) 108 (90 Base) MCG/ACT inhaler Inhale 2 puffs into the lungs every 6 (six) hours as needed for wheezing or shortness of breath. 04/09/23  Yes Marcine Matar, MD  divalproex (DEPAKOTE) 500 MG DR tablet Take 1 tablet (500 mg total) by mouth at bedtime. 12/12/23  Yes Van Clines, MD  losartan (COZAAR) 100 MG tablet Take 1 tablet (100 mg total) by mouth daily. 09/07/23  Yes Marcine Matar, MD  Multiple Vitamins-Minerals (MULTI-VITAMIN GUMMIES PO) Take 1 tablet by mouth daily.   Yes [provider]  topiramate (TOPAMAX) 200 MG tablet Take 1 tablet (200 mg total) by mouth 2 (two) times daily. 12/12/23  Yes Van Clines, MD   lisinopril-hydrochlorothiazide (ZESTORETIC) 10-12.5 MG tablet Take 2 tablets by mouth daily. To lower blood pressure Patient not taking: Reported on 12/05/2020 08/12/20 12/08/20  Rema Fendt, NP      Allergies    Dilantin [phenytoin], Aspirin, and Tramadol    Review of Systems   Review of Systems  Physical Exam Updated Vital Signs BP (!) 149/98 (BP Location: Left Arm)   Pulse 65   Temp 97.9 F (36.6 C) (Oral)   Resp 20   LMP 03/08/2016   SpO2 100%  Physical Exam Constitutional:      General: She is not in acute distress.    Comments: Postictal confusion  HENT:     Head: Normocephalic and atraumatic.  Eyes:     Conjunctiva/sclera: Conjunctivae normal.     Pupils: Pupils are equal, round, and reactive to light.  Cardiovascular:     Rate and Rhythm: Normal rate and regular rhythm.  Pulmonary:     Effort: Pulmonary effort is normal. No respiratory distress.  Abdominal:     General: There is no distension.     Tenderness: There is no abdominal tenderness.  Skin:    General: Skin is warm and dry.  Neurological:     Mental Status: She is alert.     ED Results / Procedures / Treatments   Labs (all labs ordered are listed, but only abnormal results are displayed) Labs Reviewed  BASIC METABOLIC PANEL - Abnormal; Notable  for the following components:      Result Value   Potassium 3.4 (*)    CO2 15 (*)    Glucose, Bld 129 (*)    Creatinine, Ser 1.01 (*)    Anion gap 19 (*)    All other components within normal limits  CBC - Abnormal; Notable for the following components:   HCT 46.2 (*)    All other components within normal limits  VALPROIC ACID LEVEL - Abnormal; Notable for the following components:   Valproic Acid Lvl 27 (*)    All other components within normal limits  CK - Abnormal; Notable for the following components:   Total CK 338 (*)    All other components within normal limits  URINALYSIS, ROUTINE W REFLEX MICROSCOPIC - Abnormal; Notable for the following  components:   Color, Urine STRAW (*)    Specific Gravity, Urine 1.004 (*)    All other components within normal limits  RAPID URINE DRUG SCREEN, HOSP PERFORMED - Abnormal; Notable for the following components:   Benzodiazepines POSITIVE (*)    Tetrahydrocannabinol POSITIVE (*)    All other components within normal limits  HEMOGLOBIN A1C - Abnormal; Notable for the following components:   Hgb A1c MFr Bld 4.7 (*)    All other components within normal limits  BASIC METABOLIC PANEL - Abnormal; Notable for the following components:   CO2 19 (*)    Glucose, Bld 104 (*)    All other components within normal limits  VALPROIC ACID LEVEL - Abnormal; Notable for the following components:   Valproic Acid Lvl 103 (*)    All other components within normal limits  GLUCOSE, CAPILLARY - Abnormal; Notable for the following components:   Glucose-Capillary 134 (*)    All other components within normal limits  GLUCOSE, CAPILLARY - Abnormal; Notable for the following components:   Glucose-Capillary 124 (*)    All other components within normal limits  GLUCOSE, CAPILLARY - Abnormal; Notable for the following components:   Glucose-Capillary 111 (*)    All other components within normal limits  CBG MONITORING, ED - Abnormal; Notable for the following components:   Glucose-Capillary 103 (*)    All other components within normal limits  RESP PANEL BY RT-PCR (RSV, FLU A&B, COVID)  RVPGX2  HIV ANTIBODY (ROUTINE TESTING W REFLEX)  TSH  GLUCOSE, CAPILLARY  GLUCOSE, CAPILLARY    EKG EKG Interpretation Date/Time:  Friday February 01 2024 12:42:53 EST Ventricular Rate:  90 PR Interval:  165 QRS Duration:  88 QT Interval:  375 QTC Calculation: 454 R Axis:   80  Text Interpretation: Sinus rhythm Atrial premature complex LAE, consider biatrial enlargement Movement artifact baseline wander Confirmed by Alvester Chou [phone]) on 02/01/2024 1:24:05 PM  Radiology Overnight EEG with video Result Date:  02/02/2024 Charlsie Quest, MD     02/02/2024  7:23 AM Patient Name: Tammy [name] MRN: [id number] Epilepsy Attending: Charlsie Quest Referring Physician/Provider: Rejeana Brock, MD Duration: 02/01/2024 1713 to 02/02/2024 0700 Patient history: 58 y.o. female with a history of left temporal lobe epilepsy who presents with multiple partial seizures which progressed to partial status epilepticus while in the emergency department. EEG to evaluate for seizure Level of alertness: Awake, asleep AEDs during EEG study: LEV, VPA Technical aspects: This EEG study was done with scalp electrodes positioned according to the 10-20 International system of electrode placement. Electrical activity was reviewed with band pass filter of 1-70Hz , sensitivity of 7 uV/mm, display speed of 31mm/sec with  a 60Hz  notched filter applied as appropriate. EEG data were recorded continuously and digitally stored.  Video monitoring was available and reviewed as appropriate. Description: At the beginning of the study, EEG showed seizures arising from right frontal region. During seizure, no clinical signs were noted. EEG showed 5-6Hz  theta slowing in right frontal region which then evolved into 13-15h sharply contoured beta activity. EEG then involved vertex region as well as left frontal region and evolved into 3-5Hz  theta-delta slowing. Average 10 seizures were noted per hour initially and gradually improved to 6 seizure/hour. Average duration of seizure was about 15-20 seconds. EEG was disconnected between 02/01/2024 2105 to 2331 as patient was bring transferred from ED to floor. IV depakote was administered around 2208 on 02/01/2024. When eeg was reconnected, no further seizures were noted. EEG showed continuous generalized 3 to 6 Hz theta-delta slowing. Independent sharp waves were noted in left anterior temporal region and right frontal region. Sleep was characterized by sleep spindles (12-14hz  ), maximal fronto-central region.  Hyperventilation and photic stimulation were not performed.   ABNORMALITY - Seizure without clinical signs, right frontal region -Sharp wave, right frontal region -Sharp wave, left anterior temporal region - Continuous slow, generalized IMPRESSION: This study initially showed seizures without clinical signs arising from right frontal region. At the beginning frequency of seizures was average 10 seizures/hour which gradually improved to 6 seizures/hour. Average duration of seizures was 15-20 seconds. IV depakote was administered around 2208 on 02/01/2024. Subsequently seizures abated. EEG then showed evidence of independent epileptogenicity arising from right frontal region and left anterior temporal region. Lastly there was moderate to severe diffuse encephalopathy. Charlsie Quest   CT Head Wo Contrast Result Date: 02/01/2024 CLINICAL DATA:  Mental status change.  Seizure. EXAM: CT HEAD WITHOUT CONTRAST TECHNIQUE: Contiguous axial images were obtained from the base of the skull through the vertex without intravenous contrast. RADIATION DOSE REDUCTION: This exam was performed according to the departmental dose-optimization program which includes automated exposure control, adjustment of the mA and/or kV according to patient size and/or use of iterative reconstruction technique. COMPARISON:  MR head without and with contrast scratched at CT head without contrast 08/17/2022 at [facility]. FINDINGS: Brain: No acute infarct, hemorrhage, or mass lesion is present. No significant white matter lesions are present. Deep brain nuclei are within normal limits. No significant extraaxial fluid collection is present. The ventricles are of normal size. The brainstem and cerebellum are within normal limits. Midline structures are within normal limits. Vascular: No hyperdense vessel or unexpected calcification. Skull: Calvarium is intact. No focal lytic or blastic lesions are present. No significant extracranial  soft tissue lesion is present. Sinuses/Orbits: Small polyp or mucous retention cyst is noted anteriorly in the maxillary sinuses bilaterally. The paranasal sinuses and mastoid air cells are otherwise clear. The globes and orbits are within normal limits. IMPRESSION: 1. Normal CT appearance of the brain. No acute or focal lesion to explain the patient's seizures. 2. Small polyp or mucous retention cyst anteriorly in the maxillary sinuses bilaterally. Electronically Signed   By: Marin Roberts M.D.   On: 02/01/2024 17:28    Procedures Procedures    Medications Ordered in ED Medications  sodium chloride flush (NS) 0.9 % injection 3 mL (3 mLs Intravenous Given 02/02/24 1002)  enoxaparin (LOVENOX) injection 40 mg (40 mg Subcutaneous Given 02/02/24 1001)  LORazepam (ATIVAN) injection 2 mg (has no administration in time range)  levETIRAcetam (KEPPRA) IVPB 1500 mg/ 100 mL premix (1,500 mg Intravenous  New Bag/Given 02/02/24 1001)  valproate (DEPACON) 500 mg in dextrose 5 % 50 mL IVPB (500 mg Intravenous New Bag/Given 02/02/24 0617)  topiramate (TOPAMAX) tablet 200 mg (has no administration in time range)  ondansetron (ZOFRAN) injection 4 mg ( Intravenous Canceled Entry 02/01/24 1318)  levETIRAcetam (KEPPRA) IVPB 500 mg/100 mL premix (0 mg Intravenous Stopped 02/01/24 1700)    And  levETIRAcetam (KEPPRA) IVPB 1000 mg/100 mL premix (0 mg Intravenous Stopped 02/01/24 1700)  LORazepam (ATIVAN) injection 2 mg (2 mg Intravenous Given 02/01/24 1630)  levETIRAcetam (KEPPRA) IVPB 1000 mg/100 mL premix (0 mg Intravenous Stopped 02/01/24 1710)  valproate (DEPACON) 1,500 mg in dextrose 5 % 50 mL IVPB (0 mg Intravenous Stopped 02/01/24 2308)  potassium chloride 10 mEq in 100 mL IVPB (0 mEq Intravenous Stopped 02/02/24 1610)    ED Course/ Medical Decision Making/ A&P Clinical Course as of 02/02/24 1236  Fri Feb 01, 2024  1254 Given 5 MG IM versed by EMS on arrival for tonic clonic seizure lasting 2 minutes, post ictal [MT]   1310 Vomiting, zofran ordered, xray for aspiration evaluation [MT]  1508 Dr Amada Jupiter neurology recommending keppra 1500 mg IV dose, will evaluate patient [MT]  1541 Admitted to family practice [MT]    Clinical Course User Index [MT] Terald Sleeper, MD                                 Medical Decision Making Amount and/or Complexity of Data Reviewed Labs: ordered. Radiology: ordered.  Risk Prescription drug management. Decision regarding hospitalization.   This patient presents to the ED with concern for seizures. This involves an extensive number of treatment options, and is a complaint that carries with it a high risk of complications and morbidity.   Co-morbidities that complicate the patient evaluation: hx of persistent seizure disorder  Additional history obtained from EMS  External records from outside source obtained and reviewed including neurology evaluation and medication adjustments  I ordered and personally interpreted labs.  The pertinent results include:  basic labs without emergent findings  I ordered imaging studies including CT head I independently visualized and interpreted imaging which showed possibly sinus polyp, no intracranial injury noted I agree with the radiologist interpretation  The patient was maintained on a cardiac monitor.  I personally viewed and interpreted the cardiac monitored which showed an underlying rhythm of: NSR  Per my interpretation the patient's ECG shows baseline wander but no evidence acute arrhythmia or ischemia  I ordered medication including IV keppra for seizure, ativan/versed both given for seizures in ED (versed per EMS on arrival)  I have reviewed the patients home medicines and have made adjustments as needed  Test Considered: doubt acute meningitis - no fever, no leukocytosis, I do not believe she needs an emergent LP  I requested consultation with the neurology,  and discussed lab and imaging findings as well as  pertinent plan - they recommend: medical admission, EEG monitoring  After the interventions noted above, I reevaluated the patient and found that they have: improved   Dispostion:  After consideration of the diagnostic results and the patients response to treatment, I feel that the patent would benefit from medical admission.         Final Clinical Impression(s) / ED Diagnoses Final diagnoses:  Seizure (HCC)    Rx / DC Orders ED Discharge Orders     None  Terald Sleeper, MD 02/02/24 1236

## 2024-02-01 NOTE — ED Notes (Signed)
 Spoke with son who states either himself of his sister will head over to pick patient up

## 2024-02-01 NOTE — Plan of Care (Signed)
 [facility] Brief Progress Note  S: Night round with Dr. Rexene Alberts. Patient lying on left side arms flexed, in side sleeping position. Non responsive to voice.   O: BP 139/88   Pulse 70   Temp 99 F (37.2 C) (Axillary)   Resp 17   LMP 03/08/2016   SpO2 100%   General: NAD, sleeping on hospital bed Neuro: Withdraws to pain, pupils equal round and reactive to light, no obvious facial asymmetry, GCS 7 Cardiovascular: RRR, no murmurs, no peripheral edema Respiratory: normal WOB on RA, CTAB, no wheezes, ronchi or rales Abdomen: soft, NTTP, no rebound or guarding Extremities: Not moving extremities   A/P: Seizures Vital signs stable. No new neurologic status changes since admission. Likely snowed and severely postictal. Protecting airway.  - Neurology consulted, recs appreciated - PRN Ativan 2mg  for seizure lasting greater than 5 minutes - S/p Keppra and Depakote loading  - Orders reviewed. Labs for AM ordered, which was adjusted as needed.  - Remainder of plan per day team.  Celine Mans, MD 02/01/2024, 8:35 PM PGY-2, [facility] Night Resident  Please page [phone] with questions.

## 2024-02-01 NOTE — Consult Note (Addendum)
 NEUROLOGY CONSULT NOTE   Date of service: February 01, 2024 Patient Name: Tammy [name] MRN:  [id number] DOB:  26-Aug-1966 Chief Complaint: "seizure" Requesting Provider: Nestor Ramp, MD  History of Present Illness  Tammy [name] is a 58 y.o. female with hx of left temporal lobe epilepsy, migraine presenting with altered mental status following for seizures in 24 hours.  Of note, per chart review, patient has noted two seizures over the past 5 months, most recently in December.  Witnesses noted incontinence and shaking/vocalizations at that time.  On outpatient neurology appointment 1/15, patient Depakote was reduced from 500 mg twice daily ? 500 mg daily.   On this presentation, patient was initially seen in early morning of 3/7 in the setting of seizure, improved in 2 to 3 minutes with 5 mg IV midazolam.  At that time, stated that seizure may have been stress related, as she is been under increased stress, and that she takes Topamax and Depakote as prescribed.  Return to baseline, and was discharged home without incident.  Patient presented again this afternoon to the emergency department via EMS after being found seizing.  Patient was again given 5 mg midazolam, found to be obtunded on arrival.  Neurology was consulted for multiple seizures.  ED lab work was significant for VPA level of 27 low, creatinine 1.01 (baseline 0.8), CK of 338.  On initial evaluation, patient was leaning preferentially towards the left unresponsive to voice reductions to stimuli.  GCS of 7 (2, 1, 4).  Responsive to noxious stimuli.  Intermittent coughing.  Patient had bilateral flexed upper and lower extremities, resistant to manual manipulation.  Not alert or oriented to self.  Over course of exam, patient developed clonus in left upper and lower extremity, which appeared as if it were going to generalize.  Patient was then given stat IV Ativan 2 mg push, after which clonus resolved.  Patient was then started on 2  gravity IV Keppra drips totaling 3.5 g.  Stat long-term monitoring EEG ordered, which showed recurrent seizures.   Primary team was unable to contact family for collateral information.  ROS  Unable to ascertain due to nonresponsive  Past History   Past Medical History:  Diagnosis Date   Asthma    Blood transfusion without reported diagnosis    'a long time ago"   GERD (gastroesophageal reflux disease)    Headache(784.0)    Hypertension    Seizures (HCC)     Past Surgical History:  Procedure Laterality Date   HERNIA REPAIR     UTERINE FIBROID SURGERY      Family History: Family History  Problem Relation Age of Onset   Heart disease Mother    Migraines Mother    Heart attack Father    Colon cancer Neg Hx     Social History  reports that she has never smoked. She has never used smokeless tobacco. She reports current drug use. Drug: Marijuana. She reports that she does not drink alcohol.  Allergies  Allergen Reactions   Dilantin [Phenytoin] Swelling   Aspirin Other (See Comments)    Patient unsure of reaction    Tramadol Itching and Other (See Comments)    Pt states she ins unsure of reaction Pt states she ins unsure of reaction Pt states she ins unsure of reaction Pt states she ins unsure of reaction    Medications   Current Facility-Administered Medications:    [START ON 02/02/2024] enoxaparin (LOVENOX) injection 40 mg, 40 mg, Subcutaneous,  Q24H, Shitarev, Dimitry, MD   LORazepam (ATIVAN) injection 2 mg, 2 mg, Intravenous, PRN, Shitarev, Dimitry, MD   potassium chloride 10 mEq in 100 mL IVPB, 10 mEq, Intravenous, Q1 Hr x 4, Shitarev, Dimitry, MD   sodium chloride flush (NS) 0.9 % injection 3 mL, 3 mL, Intravenous, Q12H, Shitarev, Dimitry, MD   valproate (DEPACON) 1,500 mg in dextrose 5 % 50 mL IVPB, 1,500 mg, Intravenous, Once, Rejeana Brock, MD  Current Outpatient Medications:    acetaminophen (TYLENOL) 500 MG tablet, Take 1 tablet (500 mg total) by  mouth every 8 (eight) hours as needed., Disp: 60 tablet, Rfl: 1   albuterol (VENTOLIN HFA) 108 (90 Base) MCG/ACT inhaler, Inhale 2 puffs into the lungs every 6 (six) hours as needed for wheezing or shortness of breath., Disp: 18 g, Rfl: 2   divalproex (DEPAKOTE) 500 MG DR tablet, Take 1 tablet (500 mg total) by mouth at bedtime., Disp: 90 tablet, Rfl: 3   losartan (COZAAR) 100 MG tablet, Take 1 tablet (100 mg total) by mouth daily., Disp: 30 tablet, Rfl: 0   Multiple Vitamins-Minerals (MULTI-VITAMIN GUMMIES PO), Take 1 tablet by mouth daily., Disp: , Rfl:    topiramate (TOPAMAX) 200 MG tablet, Take 1 tablet (200 mg total) by mouth 2 (two) times daily., Disp: 180 tablet, Rfl: 3  Vitals   Vitals:   2024-02-18 1251 02-18-24 1254 2024/02/18 1700 February 18, 2024 1714  BP:  117/84 (!) 142/104   Pulse:  84 71   Resp:  (!) 23 14   Temp:  98.3 F (36.8 C)  99 F (37.2 C)  TempSrc:  Axillary  Axillary  SpO2: 100% 100% 99%     There is no height or weight on file to calculate BMI.  Physical Exam   Constitutional: Middle-aged female laying in bed, unresponsive to voice or noxious stimuli Psych: Unable to assess Cardiovascular: Normal rate and regular rhythm per tele Respiratory: Effort normal, non-labored breathing.    Neurologic Examination    Mental Status - Patient unresponsive to voice, touch, noxious stimuli.  Fixed left lateral gaze.  Cough reflex intact.  Would return lower extremities to flexed posture when extended. Level of arousal and orientation unable to assess, patient unresponsive Language including expression, naming, repetition, comprehension unable to assess, patient unresponsive Attention span and concentration unable to assess, patient unresponsive Recent and remote memory unable to assess, patient unresponsive Fund of Knowledge was unable to assess, patient unresponsive  Cranial Nerves II - XII - II -unable to assess III, IV, VI - presented with fixed left gaze preference that  did not cross midline and subtle nystagmus. V - no obvious asymmetry. VII - unable to assess VIII - unable to assess X - unable to assess XI - unable to assess XII - unable to assess  Motor Strength - appeared largely intact, however patient presented with persistent left-sided flexion, as well as bilateral hip and knee knee flexion and upper extremity flexion.  Pushes back against exam.  Appears 5 out of 5 in all extremities.  Motor Tone - patient had universal increased muscle tone, stiffness and rigidity throughout.  As left partial status epilepticus progress, clonus was noted in left sided upper and lower limb, absent in right upper and lower limbs  Reflexes - The patient's reflexes were not assessed  Sensory -unable to assess  Coordination - unable to assess  Gait and Station - deferred.   Labs/Imaging/Neurodiagnostic studies   CBC:  Recent Labs  Lab February 18, 2024 0241 February 18, 2024 1236  WBC 5.7 7.7  NEUTROABS 3.6  --   HGB 12.0 14.2  HCT 37.5 46.2*  MCV 90.4 93.7  PLT 233 278   Basic Metabolic Panel:  Lab Results  Component Value Date   NA 139 02/01/2024   K 3.4 (L) 02/01/2024   CO2 15 (L) 02/01/2024   GLUCOSE 129 (H) 02/01/2024   BUN 14 02/01/2024   CREATININE 1.01 (H) 02/01/2024   CALCIUM 9.7 02/01/2024   GFRNONAA >60 02/01/2024   GFRAA 108 12/22/2020   Lipid Panel:  Lab Results  Component Value Date   LDLCALC 143 (H) 02/20/2022   HgbA1c: No results found for: "HGBA1C" Urine Drug Screen:     Component Value Date/Time   LABOPIA NONE DETECTED 07/13/2014 0517   COCAINSCRNUR NONE DETECTED 07/13/2014 0517   LABBENZ NONE DETECTED 07/13/2014 0517   AMPHETMU NONE DETECTED 07/13/2014 0517   THCU POSITIVE (A) 07/13/2014 0517   LABBARB NONE DETECTED 07/13/2014 0517    Alcohol Level     Component Value Date/Time   ETH <5 06/28/2016 0758   INR  Lab Results  Component Value Date   INR 2.7 12/30/2007   APTT  Lab Results  Component Value Date   APTT (H)  07/04/2007    97        IF BASELINE aPTT IS ELEVATED, SUGGEST PATIENT RISK ASSESSMENT BE USED TO DETERMINE APPROPRIATE ANTICOAGULANT THERAPY.   AED levels:  Lab Results  Component Value Date   PHENYTOIN 5.6 (L) 08/20/2016   LEVETIRACETA <1.0 (L) 05/04/2021    CT Head without contrast: 1. Normal CT appearance of the brain. No acute or focal lesion to explain the patient's seizures. 2. Small polyp or mucous retention cyst anteriorly in the maxillary sinuses bilaterally.  Neurodiagnostics LTM EEG:  Continuing seizure activity.  ASSESSMENT   Tammy [name] is a 58 y.o. female with a history of left temporal lobe epilepsy who presents with multiple partial seizures which progressed to partial status epilepticus while in the emergency department.  Etiology of seizure trigger is unknown at this time.  Voiced to ED team on first visit that patient has been undergoing increased stress recently, which typically triggers seizure.  UDS to be collected.  Primary team has reached out to family, to no response.  Per chart review, patient reduced Depakote dosing 500 mg twice daily ? 500 mg nightly on 1/15, which would explain her subtherapeutic lab levels.  Plan is to load with Keppra and valproate, continue long-term EEG monitoring, with as needed Ativan.  RECOMMENDATIONS   -S/p Ativan 2 mg x1 and Keppra 3.5 g -Start PRN IV 2 mg Ativan as needed for seizures lasting greater than 5 minutes -Start IV valproate 1.5 g x1 -Continue long-term monitoring EEG -Seizure precautions -Q2h neuro checks -Notify physician for: Deterioration in neurological status, continuous seizure activity greater than 10 minutes or if continue after pharmacological intervention, if seizure activity continues more than 3 minutes after administering lorazepam, if seizure continues greater than 30 minutes. -Neurology service will continue to  follow ______________________________________________________________________  Signed, Tomie China, MD [facility] Psychiatry Resident, PGY 1  I have seen the patient and reviewed the above note.  On my initial review of the EEG, there were no ongoing seizures, but subsequently she did have recurrent brief ictal discharges that were recurrent and frequent.  She is therefore being loaded with IV Depacon.  If she continues to have seizures, would favor loading with lacosamide.  I suspect that she was in focal status  for quite some time, but hopefully she will improve over the next day or two.  Ritta Slot, MD [facility]   If 7pm- 7am, please page neurology on call as listed in [facility]ION.

## 2024-02-01 NOTE — ED Notes (Signed)
 Clinical staff unable to obtain a xray at this time due to pt positioning.

## 2024-02-01 NOTE — Discharge Instructions (Signed)
 Please be aware you may have another seizure  Do not drive until seen by your physician for your condition  Do not climb ladders/roofs/trees as a seizure can occur at that height and cause serious harm  Do not bathe/swim alone as a seizure can occur and cause serious harm  Please followup with your physician or neurologist for further testing and possible treatment

## 2024-02-01 NOTE — Progress Notes (Signed)
 LTM EEG hooked up and running - no initial skin breakdown - push button tested - [facility] monitoring. Cart #6 HB #19

## 2024-02-01 NOTE — Progress Notes (Signed)
 STAT LTM EEG hooked up and running with CT compatible leads. [facility] monitoring. Dr. Amada Jupiter looked at [facility] when initially hooked up. [facility] not monitoring due to patient in ED.

## 2024-02-01 NOTE — ED Notes (Signed)
 Neuro at bedside.

## 2024-02-02 ENCOUNTER — Inpatient Hospital Stay (HOSPITAL_COMMUNITY)

## 2024-02-02 DIAGNOSIS — R569 Unspecified convulsions: Secondary | ICD-10-CM | POA: Diagnosis not present

## 2024-02-02 DIAGNOSIS — G40901 Epilepsy, unspecified, not intractable, with status epilepticus: Secondary | ICD-10-CM

## 2024-02-02 DIAGNOSIS — G9340 Encephalopathy, unspecified: Secondary | ICD-10-CM

## 2024-02-02 DIAGNOSIS — I10 Essential (primary) hypertension: Secondary | ICD-10-CM | POA: Diagnosis not present

## 2024-02-02 DIAGNOSIS — G40101 Localization-related (focal) (partial) symptomatic epilepsy and epileptic syndromes with simple partial seizures, not intractable, with status epilepticus: Secondary | ICD-10-CM | POA: Diagnosis not present

## 2024-02-02 LAB — RAPID URINE DRUG SCREEN, HOSP PERFORMED
Amphetamines: NOT DETECTED
Barbiturates: NOT DETECTED
Benzodiazepines: POSITIVE — AB
Cocaine: NOT DETECTED
Opiates: NOT DETECTED
Tetrahydrocannabinol: POSITIVE — AB

## 2024-02-02 LAB — URINALYSIS, ROUTINE W REFLEX MICROSCOPIC
Bilirubin Urine: NEGATIVE
Glucose, UA: NEGATIVE mg/dL
Hgb urine dipstick: NEGATIVE
Ketones, ur: NEGATIVE mg/dL
Leukocytes,Ua: NEGATIVE
Nitrite: NEGATIVE
Protein, ur: NEGATIVE mg/dL
Specific Gravity, Urine: 1.004 — ABNORMAL LOW (ref 1.005–1.030)
pH: 7 (ref 5.0–8.0)

## 2024-02-02 LAB — BASIC METABOLIC PANEL
Anion gap: 11 (ref 5–15)
BUN: 12 mg/dL (ref 6–20)
CO2: 19 mmol/L — ABNORMAL LOW (ref 22–32)
Calcium: 9.2 mg/dL (ref 8.9–10.3)
Chloride: 105 mmol/L (ref 98–111)
Creatinine, Ser: 0.83 mg/dL (ref 0.44–1.00)
GFR, Estimated: >60 mL/min (ref >60)
Glucose, Bld: 104 mg/dL — ABNORMAL HIGH (ref 70–99)
Potassium: 4 mmol/L (ref 3.5–5.1)
Sodium: 135 mmol/L (ref 135–145)

## 2024-02-02 LAB — VALPROIC ACID LEVEL: Valproic Acid Lvl: 103 ug/mL — ABNORMAL HIGH (ref 50.0–100.0)

## 2024-02-02 LAB — GLUCOSE, CAPILLARY
Glucose-Capillary: 111 mg/dL — ABNORMAL HIGH (ref 70–99)
Glucose-Capillary: 73 mg/dL (ref 70–99)
Glucose-Capillary: 81 mg/dL (ref 70–99)
Glucose-Capillary: 83 mg/dL (ref 70–99)
Glucose-Capillary: 84 mg/dL (ref 70–99)
Glucose-Capillary: 94 mg/dL (ref 70–99)

## 2024-02-02 LAB — TSH: TSH: 0.417 u[IU]/mL (ref 0.350–4.500)

## 2024-02-02 LAB — HIV ANTIBODY (ROUTINE TESTING W REFLEX): HIV Screen 4th Generation wRfx: NONREACTIVE

## 2024-02-02 LAB — HEMOGLOBIN A1C
Hgb A1c MFr Bld: 4.7 % — ABNORMAL LOW (ref 4.8–5.6)
Mean Plasma Glucose: 88.19 mg/dL

## 2024-02-02 MED ORDER — TOPIRAMATE 100 MG PO TABS
200.0000 mg | ORAL_TABLET | Freq: Two times a day (BID) | ORAL | Status: DC
  Administered 2024-02-02 – 2024-02-06 (×8): 200 mg via ORAL
  Filled 2024-02-02 (×8): qty 2

## 2024-02-02 NOTE — Plan of Care (Incomplete)
 FMTS Brief Progress Note  S:***   O: BP (!) 153/97 (BP Location: Left Arm)   Pulse 66   Temp 98.3 F (36.8 C) (Oral)   Resp 20   LMP 03/08/2016   SpO2 100%     A/P: Seizures Encephalopathy -continue keppra 1,500 mg IV and valproate1,500 mg IV -Oral topamax 200 mg BID started earlier today -Ativan 2 mg IV prn for seizures lasting >51min  -am valproic acid level  Erick Alley, DO 02/02/2024, 7:24 PM PGY-3, [facility] Night Resident  Please page [phone] with questions.

## 2024-02-02 NOTE — Plan of Care (Signed)
   Problem: Education: Goal: Knowledge of General Education information will improve Description Including pain rating scale, medication(s)/side effects and non-pharmacologic comfort measures Outcome: Progressing

## 2024-02-02 NOTE — Progress Notes (Signed)
     Daily Progress Note Intern Pager: [phone]  Patient name: Tammy [name] Medical record number: [id number] Date of birth: Aug 26, 1966 Age: 58 y.o. Gender: female  Primary Care Provider: Marcine Matar, MD Consultants: Neurology Code Status: Full  Pt Overview and Major Events to Date:  3/8 - Admitted  Assessment and Plan:  [place] C Ferrando is a 58 y.o. admitted for status epilepticus in the setting of suspected medication non-adherence/stress. Pertinent PMH/PSH includes left focal temporal lobe epilepsy.  Assessment & Plan Seizures (HCC) Neurologic status improved, seizures seen on EEG. Valproic acid level improved. Home regimen Depakote 500 mg QHS, topiramate 200 mg BID.  -Q2h neurochecks -Lorazepam 2 mg PRN for seizures lasting >5 minutes -Seizure precautions -NPO while altered, SLP consult when improving -Continuous cardiac monitoring, continuous pulse ox -Obtain collateral -Neurology consult, appreciate assistance, follow-up further recommendations: -Keppra load with 3.5 g -Depakote load with 1.5g -EEG until discontinued Encephalopathy Neurologic status improved GCS 9. Likely postictal period with recurrent intermittent seizures. Protecting airway. -Delirium precautions, bladder scan x1 -UDS -CXR - reattempt -Follow-up UA -Seizure management as above Hypertension Normotensive. -Hold Losartan 100 mg   Chronic and Stable Issues: GERD: No meds. Asthma: Albuterol inhaler Q6h PRN  FEN/GI: NPO while altered, later SLP eval  PPx: Lovenox  Dispo:Home pending clinical improvement .  Subjective:  NAEO. Patient improved from previous.  Objective: Temp:  [98 F (36.7 C)-99.4 F (37.4 C)] 99.4 F (37.4 C) (03/07 2338) Pulse Rate:  [58-84] 78 (03/07 2338) Resp:  [14-23] 17 (03/07 2338) BP: (112-155)/(79-104) 112/79 (03/07 2338) SpO2:  [99 %-100 %] 100 % (03/07 2338) Weight:  [59 kg] 59 kg (03/07 0511) Physical Exam: General: NAD, sleeping comfortably in  hospital bed Neuro: Arousal to touch, continues to not speak, open eyes to touch, extremities non rigid, symmetric faces Cardiovascular: RRR, no murmurs, no peripheral edema Respiratory: normal WOB on RA, CTAB, no wheezes, ronchi or rales Abdomen: soft, NTTP, no rebound or guarding Extremities: Moving all 4 extremities equally   Laboratory: Most recent CBC Lab Results  Component Value Date   WBC 7.7 02/01/2024   HGB 14.2 02/01/2024   HCT 46.2 (H) 02/01/2024   MCV 93.7 02/01/2024   PLT 278 02/01/2024   Most recent BMP    Latest Ref Rng & Units 02/02/2024   12:27 AM  BMP  Glucose 70 - 99 mg/dL 841   BUN 6 - 20 mg/dL 12   Creatinine 3.24 - 1.00 mg/dL 4.01   Sodium 027 - 253 mmol/L 135   Potassium 3.5 - 5.1 mmol/L 4.0   Chloride 98 - 111 mmol/L 105   CO2 22 - 32 mmol/L 19   Calcium 8.9 - 10.3 mg/dL 9.2     TSH - 6.644 HIV screen - negative Valproic acid level 103   Imaging/Diagnostic Tests: No new imaging.  Celine Mans, MD 02/02/2024, 2:57 AM  PGY-2, [facility] FPTS Intern pager: [phone], text pages welcome Secure chat group [facility] Teaching Service

## 2024-02-02 NOTE — Plan of Care (Signed)
  Problem: Safety: Goal: Verbalization of understanding the information provided will improve Outcome: Progressing   Problem: Skin Integrity: Goal: Risk for impaired skin integrity will decrease Outcome: Progressing   Problem: Elimination: Goal: Will not experience complications related to bowel motility Outcome: Progressing Goal: Will not experience complications related to urinary retention Outcome: Progressing   Problem: Clinical Measurements: Goal: Ability to maintain clinical measurements within normal limits will improve Outcome: Progressing Goal: Will remain free from infection Outcome: Progressing Goal: Diagnostic test results will improve Outcome: Progressing Goal: Respiratory complications will improve Outcome: Progressing Goal: Cardiovascular complication will be avoided Outcome: Progressing

## 2024-02-02 NOTE — Assessment & Plan Note (Signed)
 Normotensive. -Hold Losartan 100 mg

## 2024-02-02 NOTE — Progress Notes (Signed)
LTM maint complete - no skin breakdown under: Fp1 Fp2  

## 2024-02-02 NOTE — Procedures (Signed)
 Patient Name: Tammy [name]  MRN: [id number]  Epilepsy Attending: Charlsie Quest  Referring Physician/Provider: Rejeana Brock, MD  Duration: 02/01/2024 1713 to 02/02/2024 1713  Patient history: 58 y.o. female with a history of left temporal lobe epilepsy who presents with multiple partial seizures which progressed to partial status epilepticus while in the emergency department. EEG to evaluate for seizure  Level of alertness: Awake, asleep  AEDs during EEG study: LEV, VPA  Technical aspects: This EEG study was done with scalp electrodes positioned according to the 10-20 International system of electrode placement. Electrical activity was reviewed with band pass filter of 1-70Hz , sensitivity of 7 uV/mm, display speed of 62mm/sec with a 60Hz  notched filter applied as appropriate. EEG data were recorded continuously and digitally stored.  Video monitoring was available and reviewed as appropriate.  Description: At the beginning of the study, EEG showed seizures arising from right frontal region. During seizure, no clinical signs were noted. EEG showed 5-6Hz  theta slowing in right frontal region which then evolved into 13-15h sharply contoured beta activity. EEG then involved vertex region as well as left frontal region and evolved into 3-5Hz  theta-delta slowing. Average 10 seizures were noted per hour initially and gradually improved to 6 seizure/hour. Average duration of seizure was about 15-20 seconds. EEG was disconnected between 02/01/2024 2105 to 2331 as patient was bring transferred from ED to floor. IV depakote was administered around 2208 on 02/01/2024. When eeg was reconnected, no further seizures were noted.   EEG showed continuous generalized 3 to 6 Hz theta-delta slowing. Independent sharp waves were noted in left anterior temporal region and right frontal region. Sleep was characterized by sleep spindles (12-14hz  ), maximal fronto-central region. Hyperventilation and photic stimulation  were not performed.     ABNORMALITY - Seizure without clinical signs, right frontal region -Sharp wave, right frontal region -Sharp wave, left anterior temporal region - Continuous slow, generalized  IMPRESSION: This study initially showed seizures without clinical signs arising from right frontal region. At the beginning frequency of seizures was average 10 seizures/hour which gradually improved to 6 seizures/hour. Average duration of seizures was 15-20 seconds.   IV depakote was administered around 2208 on 02/01/2024. Subsequently seizures abated. EEG then showed evidence of independent epileptogenicity arising from right frontal region and left anterior temporal region. Lastly there was moderate to severe diffuse encephalopathy.   Dave Mannes [name]

## 2024-02-02 NOTE — Progress Notes (Signed)
 LTM EEG preliminarily reviewed from 2330 to 0145: Diffuse slowing with no electrographic seizures seen.   Electronically signed: Dr. Caryl Pina

## 2024-02-02 NOTE — Progress Notes (Signed)
 NEUROLOGY CONSULT FOLLOW UP NOTE   Date of service: February 02, 2024 Patient Name: Tammy [name] MRN:  [id number] DOB:  04/02/1966  Interval Hx/subjective   She had seizures on EEG yesterday afternoon, but following Depakote load, no further seizures.  Vitals   Vitals:   02/01/24 2338 02/02/24 0403 02/02/24 0734 02/02/24 1202  BP: 112/79 132/86 (!) 133/90 (!) 149/98  Pulse: 78 66 74 65  Resp: 17 18 18 20   Temp: 99.4 F (37.4 C) 98.8 F (37.1 C) 97.6 F (36.4 C) 97.9 F (36.6 C)  TempSrc: Oral Oral Axillary Oral  SpO2: 100% 100% 100% 100%     Physical Exam   Constitutional: Lying in bed asleep  Neurologic Examination    MS: She awakens to mild stimulation, is able to tell me her name, unable to follow commands to show thumbs up.  She has significant speech perseveration, repeating the names of her medications after I am asking who about him CN: Resists checking blink to threat, but pupils are reactive bilaterally, eyes are midline Motor: She moves all extremities spontaneously, she does not cooperate with formal testing Sensory: Response to stimulation bilaterally  Medications  Current Facility-Administered Medications:    enoxaparin (LOVENOX) injection 40 mg, 40 mg, Subcutaneous, Q24H, Shitarev, Dimitry, MD, 40 mg at 02/02/24 1001   levETIRAcetam (KEPPRA) IVPB 1500 mg/ 100 mL premix, 1,500 mg, Intravenous, Q12H, Rejeana Brock, MD, Last Rate: 400 mL/hr at 02/02/24 1001, 1,500 mg at 02/02/24 1001   LORazepam (ATIVAN) injection 2 mg, 2 mg, Intravenous, PRN, Shitarev, Dimitry, MD   sodium chloride flush (NS) 0.9 % injection 3 mL, 3 mL, Intravenous, Q12H, Shitarev, Dimitry, MD, 3 mL at 02/02/24 1002   topiramate (TOPAMAX) tablet 200 mg, 200 mg, Oral, BID, Amada Jupiter, Hardin Negus, MD   valproate (DEPACON) 500 mg in dextrose 5 % 50 mL IVPB, 500 mg, Intravenous, Q8H, Rejeana Brock, MD, Last Rate: 55 mL/hr at 02/02/24 0617, 500 mg at 02/02/24 0617  Labs and  Diagnostic Imaging   VPA level 103 Creatinine 0.83  Imaging(Personally reviewed): CT head-normal  Assessment   Tammy [name] is a 58 y.o. female with a history of seizures who presents with refractory status epilepticus that finally aborted following Depakote load.  She continues to be encephalopathic, but is significantly better than she was yesterday.  I would favor continuing her current antiepileptics and restarting her home Topamax once she is able to take p.o. medicines.  Recommendations  Continue Keppra 1500 mg twice daily Continue Depakote 500 mg 3 times daily Recheck VPA level tomorrow since it was slightly elevated after loads today Continue LTM EEG Neurology will continue to follow ______________________________________________________________________   Signed, Ritta Slot, MD [facility]

## 2024-02-02 NOTE — Assessment & Plan Note (Addendum)
 Neurologic status improved, seizures seen on EEG. Valproic acid level improved. Home regimen Depakote 500 mg QHS, topiramate 200 mg BID.  -Q2h neurochecks -Lorazepam 2 mg PRN for seizures lasting >5 minutes -Seizure precautions -NPO while altered, SLP consult when improving -Continuous cardiac monitoring, continuous pulse ox -Obtain collateral -Neurology consult, appreciate assistance, follow-up further recommendations: -Keppra load with 3.5 g -Depakote load with 1.5g -EEG until discontinued

## 2024-02-02 NOTE — Assessment & Plan Note (Addendum)
 Neurologic status improved GCS 9. Likely postictal period with recurrent intermittent seizures. Protecting airway. -Delirium precautions, bladder scan x1 -UDS -CXR - reattempt -Follow-up UA -Seizure management as above

## 2024-02-03 ENCOUNTER — Inpatient Hospital Stay (HOSPITAL_COMMUNITY)

## 2024-02-03 DIAGNOSIS — G40901 Epilepsy, unspecified, not intractable, with status epilepticus: Secondary | ICD-10-CM | POA: Diagnosis not present

## 2024-02-03 DIAGNOSIS — I10 Essential (primary) hypertension: Secondary | ICD-10-CM | POA: Diagnosis not present

## 2024-02-03 DIAGNOSIS — G40101 Localization-related (focal) (partial) symptomatic epilepsy and epileptic syndromes with simple partial seizures, not intractable, with status epilepticus: Secondary | ICD-10-CM | POA: Diagnosis not present

## 2024-02-03 DIAGNOSIS — G9340 Encephalopathy, unspecified: Secondary | ICD-10-CM | POA: Diagnosis not present

## 2024-02-03 DIAGNOSIS — R569 Unspecified convulsions: Secondary | ICD-10-CM | POA: Diagnosis not present

## 2024-02-03 LAB — VALPROIC ACID LEVEL: Valproic Acid Lvl: 108 ug/mL — ABNORMAL HIGH (ref 50.0–100.0)

## 2024-02-03 LAB — GLUCOSE, CAPILLARY
Glucose-Capillary: 77 mg/dL (ref 70–99)
Glucose-Capillary: 81 mg/dL (ref 70–99)

## 2024-02-03 MED ORDER — ENSURE ENLIVE PO LIQD
237.0000 mL | Freq: Two times a day (BID) | ORAL | Status: DC
  Administered 2024-02-04 – 2024-02-06 (×5): 237 mL via ORAL

## 2024-02-03 MED ORDER — LOSARTAN POTASSIUM 50 MG PO TABS
100.0000 mg | ORAL_TABLET | Freq: Every day | ORAL | Status: DC
  Administered 2024-02-03 – 2024-02-06 (×4): 100 mg via ORAL
  Filled 2024-02-03 (×4): qty 2

## 2024-02-03 NOTE — Progress Notes (Signed)
LTM maint complete - no skin breakdown seen Serviced T7  [facility] monitored, Event button test confirmed by [facility].

## 2024-02-03 NOTE — Plan of Care (Signed)
  Problem: Clinical Measurements: Goal: Ability to maintain clinical measurements within normal limits will improve Outcome: Progressing Goal: Will remain free from infection Outcome: Progressing Goal: Diagnostic test results will improve Outcome: Progressing Goal: Respiratory complications will improve Outcome: Progressing Goal: Cardiovascular complication will be avoided Outcome: Progressing   Problem: Elimination: Goal: Will not experience complications related to bowel motility Outcome: Progressing Goal: Will not experience complications related to urinary retention Outcome: Progressing   Problem: Nutrition: Goal: Adequate nutrition will be maintained Outcome: Progressing   Problem: Safety: Goal: Verbalization of understanding the information provided will improve Outcome: Progressing   Problem: Skin Integrity: Goal: Risk for impaired skin integrity will decrease Outcome: Progressing

## 2024-02-03 NOTE — Progress Notes (Signed)
 NEUROLOGY CONSULT FOLLOW UP NOTE   Date of service: February 03, 2024 Patient Name: Tammy [name] MRN:  [id number] DOB:  [birth year]  Interval Hx/subjective   She has had no further seizures.   Vitals   Vitals:   02/02/24 2341 02/03/24 0343 02/03/24 0757 02/03/24 1156  BP: (!) 140/85 (!) 143/93 100/76 122/87  Pulse: 62 65 69 70  Resp: 18 18 17 17   Temp: 98 F (36.7 C) 98.2 F (36.8 C) 98 F (36.7 C) 98.3 F (36.8 C)  TempSrc: Axillary Oral Oral Oral  SpO2: 100% 100% 100% 100%     Physical Exam   Constitutional: Lying in bed asleep  Neurologic Examination    MS: She is able to speak more than she was yesterday, but still has significant perseveration.  She is able to tell me her name, and able to answer some questions, but the reliability of the answers is in question. CN: Endorses seeing fingers wiggle in both hemifield's, but pupils are reactive bilaterally, eyes are midline Motor: She holds her arms without drift bilaterally. Sensory: Response to stimulation bilaterally  Medications  Current Facility-Administered Medications:    enoxaparin (LOVENOX) injection 40 mg, 40 mg, Subcutaneous, Q24H, Shitarev, Dimitry, MD, 40 mg at 02/03/24 0931   levETIRAcetam (KEPPRA) IVPB 1500 mg/ 100 mL premix, 1,500 mg, Intravenous, Q12H, Rejeana Brock, MD, Last Rate: 400 mL/hr at 02/03/24 0932, 1,500 mg at 02/03/24 0932   LORazepam (ATIVAN) injection 2 mg, 2 mg, Intravenous, PRN, Shitarev, Dimitry, MD   losartan (COZAAR) tablet 100 mg, 100 mg, Oral, Daily, Yetta Barre, Sarah, DO, 100 mg at 02/03/24 0931   sodium chloride flush (NS) 0.9 % injection 3 mL, 3 mL, Intravenous, Q12H, Shitarev, Dimitry, MD, 3 mL at 02/03/24 1019   topiramate (TOPAMAX) tablet 200 mg, 200 mg, Oral, BID, Rejeana Brock, MD, 200 mg at 02/03/24 0931   valproate (DEPACON) 500 mg in dextrose 5 % 50 mL IVPB, 500 mg, Intravenous, Q8H, Rejeana Brock, MD, Stopped at 02/03/24 0618  Labs and Diagnostic  Imaging   VPA level 103 Creatinine 0.83  Imaging(Personally reviewed): CT head-normal  Assessment   Tammy [name] is a 58 y.o. female with a history of seizures who presents with refractory status epilepticus that finally aborted following Depakote load.  She continues to be encephalopathic, but again is significantly better than she was yesterday.    She was able to convey to me today that she has had issues with Topamax but is unclear to me at this time what that was exactly.  She does endorse when I ask her if she ran out of her Topamax, but again I am not certain of the reliability of this.  Her daughter thinks that she had some still, but is not certain.  She does not have any speech difficulty at baseline, except when she is about to have a seizure.  For now I would favor continuing the Topamax, the Depakote, and the Keppra.  When she is improved and can give [place] more history regarding her Topamax  Recommendations  Continue Keppra 1500 mg twice daily Continue Depakote 500 mg 3 times daily, VPA level tomorrow Continue topiramate 200 mg twice daily Continue LTM EEG Neurology will continue to follow ______________________________________________________________________   Signed, Ritta Slot, MD [facility]

## 2024-02-03 NOTE — Progress Notes (Signed)
 LTM maint complete - no skin breakdown under:  F8, P8

## 2024-02-03 NOTE — Progress Notes (Addendum)
     Daily Progress Note Intern Pager: [phone]  Patient name: Tammy [name] Medical record number: [id number] Date of birth: [birth year] Age: 58 y.o. Gender: female  Primary Care Provider: Marcine Matar, MD Consultants: Neurology Code Status: Full  Pt Overview and Major Events to Date:  3/8-admitted  Assessment and Plan: Tammy [name] is a 58 year old female admitted for status epilepticus in setting of suspected medication nonadherence.  PMH significant for epilepsy Assessment & Plan Seizures (HCC) Neurologic status improved, seizures seen on EEG. Valproic acid level improved. Home regimen Depakote 500 mg QHS, topiramate 200 mg BID.  -Q2h neurochecks -Lorazepam 2 mg PRN for seizures lasting >5 minutes -Seizure precautions -Continuous cardiac monitoring, continuous pulse ox -Neurology on board: Valproate 500 mg IV every 8 hours, Keppra 1500 mg IV every 12 hours - f/u valproic acid level  -Continue LTM EEG  Encephalopathy Still with symptoms of encephalopathy.  Oriented to person but not place or time.  Able to follow some instructions but not consistently/accurately. likely postictal period with recurrent intermittent seizures. Protecting airway. -Delirium precautions -Seizure management as above   Chronic and Stable Issues: HTN-losartan 100 mg Asthma-albuterol inhaler as needed  FEN/GI: Regular diet PPx: Lovenox Dispo: Home pending continued medical management  Subjective:  No complaints this morning, nods her head when asked if she is feeling well.  Objective: Temp:  [97.6 F (36.4 C)-98.3 F (36.8 C)] 98.2 F (36.8 C) (03/09 0343) Pulse Rate:  [61-74] 65 (03/09 0343) Resp:  [17-20] 18 (03/09 0343) BP: (133-153)/(85-98) 143/93 (03/09 0343) SpO2:  [100 %] 100 % (03/09 0343) Physical Exam: General: 58 year old female, lying in bed sleeping and easy to wake, NAD Cardiovascular: Regular rate, irregular rhythm-reviewed telemetry with frequent  PVCs Respiratory: CTAB, normal effort Abdomen: Soft, nontender palpation, nondistended Extremities: No edema BLEs Neuro: Alert, oriented to person only. PERRL, EOEMI, 5/5 muscle strength of BLEs and BUEs.  No focal deficits, unable to complete detailed neuro exam due to inability to follow directions accurately  Laboratory: Most recent CBC Lab Results  Component Value Date   WBC 7.7 02/01/2024   HGB 14.2 02/01/2024   HCT 46.2 (H) 02/01/2024   MCV 93.7 02/01/2024   PLT 278 02/01/2024   Most recent BMP    Latest Ref Rng & Units 02/02/2024   12:27 AM  BMP  Glucose 70 - 99 mg/dL 130   BUN 6 - 20 mg/dL 12   Creatinine 8.65 - 1.00 mg/dL 7.84   Sodium 696 - 295 mmol/L 135   Potassium 3.5 - 5.1 mmol/L 4.0   Chloride 98 - 111 mmol/L 105   CO2 22 - 32 mmol/L 19   Calcium 8.9 - 10.3 mg/dL 9.2      Erick Alley, DO 02/03/2024, 4:30 AM  PGY-3,  Family Medicine FPTS Intern pager: [phone], text pages welcome Secure chat group [facility] Teaching Service

## 2024-02-03 NOTE — Procedures (Signed)
 Py Patient Name: NALEE LIGHTLE  MRN: [id number]  Epilepsy Attending: Charlsie Quest  Referring Physician/Provider: Rejeana Brock, MD  Duration: 02/02/2024 1713 to 02/03/2024 1713   Patient history: 58 y.o. female with a history of left temporal lobe epilepsy who presents with multiple partial seizures which progressed to partial status epilepticus while in the emergency department. EEG to evaluate for seizure   Level of alertness: Awake, asleep   AEDs during EEG study: LEV, VPA   Technical aspects: This EEG study was done with scalp electrodes positioned according to the 10-20 International system of electrode placement. Electrical activity was reviewed with band pass filter of 1-70Hz , sensitivity of 7 uV/mm, display speed of 57mm/sec with a 60Hz  notched filter applied as appropriate. EEG data were recorded continuously and digitally stored.  Video monitoring was available and reviewed as appropriate.   Description: EEG showed continuous generalized 3 to 6 Hz theta-delta slowing. Independent sharp waves were noted in left anterior temporal region and right frontal region. Sleep was characterized by sleep spindles (12-14hz  ), maximal fronto-central region. Hyperventilation and photic stimulation were not performed.     One seizure was noted on 02/03/2024 at 1506 arising from right frontal region. During seizure, no clinical signs were noted. EEG showed 5-6Hz  theta slowing in right frontal region which then evolved into 13-15h sharply contoured beta activity. EEG then involved vertex region as well as left frontal region and evolved into 3-5Hz  theta-delta slowing. Duration of seizure was about 50 seconds.    ABNORMALITY - Seizure without clinical signs, right frontal region  -Sharp wave, right frontal region -Sharp wave, left anterior temporal region - Continuous slow, generalized   IMPRESSION: This study showed one seizure without clinical signs on 02/03/2024 at 1506 arising from right  frontal region, lasting for about 50 seconds.   Additionally there was evidence of independent epileptogenicity arising from right frontal region and left anterior temporal region. Lastly there was moderate diffuse encephalopathy.   Tammy [name]

## 2024-02-03 NOTE — Assessment & Plan Note (Addendum)
 Neurologic status improved, seizures seen on EEG. Valproic acid level improved. Home regimen Depakote 500 mg QHS, topiramate 200 mg BID.  -Q2h neurochecks -Lorazepam 2 mg PRN for seizures lasting >5 minutes -Seizure precautions -Continuous cardiac monitoring, continuous pulse ox -Neurology on board: Valproate 500 mg IV every 8 hours, Keppra 1500 mg IV every 12 hours - f/u valproic acid level  -Continue LTM EEG

## 2024-02-03 NOTE — Assessment & Plan Note (Addendum)
 Still with symptoms of encephalopathy.  Oriented to person but not place or time.  Able to follow some instructions but not consistently/accurately. likely postictal period with recurrent intermittent seizures. Protecting airway. -Delirium precautions -Seizure management as above

## 2024-02-03 NOTE — Plan of Care (Signed)
  Problem: Education: Goal: Knowledge of General Education information will improve Description: Including pain rating scale, medication(s)/side effects and non-pharmacologic comfort measures Outcome: Progressing   Problem: Health Behavior/Discharge Planning: Goal: Ability to manage health-related needs will improve Outcome: Progressing   Problem: Health Behavior/Discharge Planning: Goal: Ability to manage health-related needs will improve Outcome: Progressing   

## 2024-02-04 ENCOUNTER — Inpatient Hospital Stay (HOSPITAL_COMMUNITY)

## 2024-02-04 DIAGNOSIS — I10 Essential (primary) hypertension: Secondary | ICD-10-CM | POA: Diagnosis not present

## 2024-02-04 DIAGNOSIS — G9340 Encephalopathy, unspecified: Secondary | ICD-10-CM | POA: Diagnosis not present

## 2024-02-04 DIAGNOSIS — G40101 Localization-related (focal) (partial) symptomatic epilepsy and epileptic syndromes with simple partial seizures, not intractable, with status epilepticus: Secondary | ICD-10-CM | POA: Diagnosis not present

## 2024-02-04 DIAGNOSIS — Z789 Other specified health status: Secondary | ICD-10-CM

## 2024-02-04 DIAGNOSIS — R569 Unspecified convulsions: Secondary | ICD-10-CM | POA: Diagnosis not present

## 2024-02-04 LAB — CBC
HCT: 44.1 % (ref 36.0–46.0)
Hemoglobin: 14.4 g/dL (ref 12.0–15.0)
MCH: 28.6 pg (ref 26.0–34.0)
MCHC: 32.7 g/dL (ref 30.0–36.0)
MCV: 87.5 fL (ref 80.0–100.0)
Platelets: 247 10*3/uL (ref 150–400)
RBC: 5.04 MIL/uL (ref 3.87–5.11)
RDW: 13.1 % (ref 11.5–15.5)
WBC: 4.9 10*3/uL (ref 4.0–10.5)
nRBC: 0 % (ref 0.0–0.2)

## 2024-02-04 LAB — BASIC METABOLIC PANEL
Anion gap: 10 (ref 5–15)
BUN: 12 mg/dL (ref 6–20)
CO2: 22 mmol/L (ref 22–32)
Calcium: 9.8 mg/dL (ref 8.9–10.3)
Chloride: 105 mmol/L (ref 98–111)
Creatinine, Ser: 0.84 mg/dL (ref 0.44–1.00)
GFR, Estimated: >60 mL/min (ref >60)
Glucose, Bld: 99 mg/dL (ref 70–99)
Potassium: 3.6 mmol/L (ref 3.5–5.1)
Sodium: 137 mmol/L (ref 135–145)

## 2024-02-04 LAB — AMMONIA: Ammonia: 35 umol/L (ref 9–35)

## 2024-02-04 MED ORDER — CLONAZEPAM 0.5 MG PO TABS
0.5000 mg | ORAL_TABLET | Freq: Two times a day (BID) | ORAL | Status: DC
  Administered 2024-02-04 – 2024-02-06 (×5): 0.5 mg via ORAL
  Filled 2024-02-04 (×5): qty 1

## 2024-02-04 MED ORDER — LEVETIRACETAM 750 MG PO TABS
1500.0000 mg | ORAL_TABLET | Freq: Two times a day (BID) | ORAL | Status: DC
  Administered 2024-02-04 – 2024-02-06 (×4): 1500 mg via ORAL
  Filled 2024-02-04 (×4): qty 2

## 2024-02-04 MED ORDER — VALPROATE SODIUM 100 MG/ML IV SOLN
500.0000 mg | Freq: Two times a day (BID) | INTRAVENOUS | Status: DC
  Filled 2024-02-04 (×2): qty 5

## 2024-02-04 MED ORDER — ALBUTEROL SULFATE (2.5 MG/3ML) 0.083% IN NEBU: 2.5000 mg | INHALATION_SOLUTION | Freq: Four times a day (QID) | RESPIRATORY_TRACT | Status: DC | PRN

## 2024-02-04 MED ORDER — DIVALPROEX SODIUM 250 MG PO DR TAB
500.0000 mg | DELAYED_RELEASE_TABLET | Freq: Two times a day (BID) | ORAL | Status: DC
  Administered 2024-02-04 – 2024-02-06 (×4): 500 mg via ORAL
  Filled 2024-02-04 (×4): qty 2

## 2024-02-04 NOTE — Progress Notes (Signed)
 Subjective: Pretty awake, smiling this morning.  However had 2 more seizures yesterday.  Was very disappointed to hear this  ROS: negative except above  Examination  Vital signs in last 24 hours: Temp:  [98 F (36.7 C)-98.5 F (36.9 C)] 98.2 F (36.8 C) (03/10 0755) Pulse Rate:  [60-71] 62 (03/10 0755) Resp:  [16-18] 18 (03/10 0755) BP: (109-135)/(77-95) 113/83 (03/10 0755) SpO2:  [99 %-100 %] 100 % (03/10 0755)  General: lying in bed, NAD Neuro: Awake, alert, oriented to place and person, time: October, able to name objects and follow commands, does intermittently perseverate on month (March), cranial nerves appear grossly intact, 5/5 in all 4 extremities  Basic Metabolic Panel: Recent Labs  Lab 02/01/24 0241 02/01/24 1236 02/02/24 0027  NA 140 139 135  K 3.6 3.4* 4.0  CL 109 105 105  CO2 23 15* 19*  GLUCOSE 93 129* 104*  BUN 14 14 12   CREATININE 0.83 1.01* 0.83  CALCIUM 9.0 9.7 9.2    CBC: Recent Labs  Lab 02/01/24 0241 02/01/24 1236  WBC 5.7 7.7  NEUTROABS 3.6  --   HGB 12.0 14.2  HCT 37.5 46.2*  MCV 90.4 93.7  PLT 233 278     Coagulation Studies: No results for input(s): "LABPROT", "INR" in the last 72 hours.  Imaging No new brain imaging overnight   ASSESSMENT AND PLAN: 58 year old female with history of epilepsy who presented with refractory status epilepticus that finally resolved with Depakote.  Status epilepticus, resolved Rhabdomyolysis due to seizures -Could be secondary to missing Topamax.  Depakote level was only 31 on arrival.  UDS only positive for THC.  No UTI  Recommendations -Depakote level 108.  Therefore will reduce Depakote to 500 mg twice daily and check ammonia -Due to 2 seizures yesterday and now having to reduce Depakote, will add Klonopin 0.5 mg twice daily -Continue Topamax 200 mg twice daily and Keppra 1500 mg twice daily. -Continue video EEG.  Will likely DC tomorrow if no seizures -Continue seizure precautions -As  needed IV Ativan for clinical seizures  I have spent a total of 37  minutes with the patient reviewing hospital notes,  test results, labs and examining the patient as well as establishing an assessment and plan that was discussed personally with the patient.  > 50% of time was spent in direct patient care.        Lindie Spruce Epilepsy [facility] For questions after 5pm please refer to AMION to reach the Neurologist on call

## 2024-02-04 NOTE — Procedures (Signed)
 Patient Name: Tammy [name]  MRN: [id number]  Epilepsy Attending: Charlsie Quest  Referring Physician/Provider: Rejeana Brock, MD  Duration: 02/03/2024 1713 to 02/04/2024 1713   Patient history: 58 y.o. female with a history of left temporal lobe epilepsy who presents with multiple partial seizures which progressed to partial status epilepticus while in the emergency department. EEG to evaluate for seizure   Level of alertness: Awake, asleep   AEDs during EEG study: LEV, VPA, TPM   Technical aspects: This EEG study was done with scalp electrodes positioned according to the 10-20 International system of electrode placement. Electrical activity was reviewed with band pass filter of 1-70Hz , sensitivity of 7 uV/mm, display speed of 37mm/sec with a 60Hz  notched filter applied as appropriate. EEG data were recorded continuously and digitally stored.  Video monitoring was available and reviewed as appropriate.   Description: EEG showed continuous generalized 3 to 6 Hz theta-delta slowing. Independent sharp waves were noted in left anterior temporal region and right frontal region. Sleep was characterized by sleep spindles (12-14hz  ), maximal fronto-central region. Hyperventilation and photic stimulation were not performed.     Two seizures were noted on 02/03/2024 at 1744 and 2221 arising from right frontal region. During seizure, no clinical signs were noted. EEG showed 5-6Hz  theta slowing in right frontal region which then evolved into 13-15h sharply contoured beta activity. EEG then involved vertex region as well as left frontal region and evolved into 3-5Hz  theta-delta slowing. Duration of seizure was about 3 minutes each.   Of note, parts of study were difficult due to significant electrode artifact.   ABNORMALITY - Seizure without clinical signs, right frontal region - Sharp wave, right frontal region - Sharp wave, left anterior temporal region - Continuous slow, generalized    IMPRESSION: This study showed two seizures without clinical signs on 02/03/2024  at 1744 and 2221 arising from right frontal region, lasting for about 3 minutes each.   Additionally there was evidence of independent epileptogenicity arising from right frontal region and left anterior temporal region. Lastly there was moderate diffuse encephalopathy.      [name]

## 2024-02-04 NOTE — Assessment & Plan Note (Addendum)
 Neurologic status essentially the same last few days, 2 seizures noted without clinical signs overnight on 3/9.  Still unclear if she ran out of her Topamax or was taking it improperly.  VPA this AM 108.  Ammonia level 35. -Neurology consulted, appreciate their recommendations as below - Reduce Depakote to 500 mg twice daily - Continue Topamax 200 mg twice daily - Continue 1500 mg twice daily - LTM EEG; may discontinue tomorrow if no further seizures -Lorazepam 2 mg PRN for seizures lasting >5 minutes -Continue seizure precautions

## 2024-02-04 NOTE — Progress Notes (Addendum)
     Daily Progress Note Intern Pager: [phone]  Patient name: Tammy [name] Medical record number: [id number] Date of birth: Apr 21, 1966 Age: 58 y.o. Gender: female  Primary Care Provider: Marcine Matar, MD Consultants: Neurology Code Status: Full  Pt Overview and Major Events to Date:  3/8-admitted  Assessment and Plan: [place] Liberman is a 58 year old female with known history of epilepsy admitted for status epilepticus in the setting of suspected medication nonadherence.  Status finally resolved with Depakote; continuing LTM EEG.  Assessment & Plan Seizures (HCC) Neurologic status essentially the same last few days, 2 seizures noted without clinical signs overnight on 3/9.  Still unclear if she ran out of her Topamax or was taking it improperly.  VPA this AM 108.  Ammonia level 35. -Neurology consulted, appreciate their recommendations as below - Reduce Depakote to 500 mg twice daily - Continue Topamax 200 mg twice daily - Continue 1500 mg twice daily - LTM EEG; may discontinue tomorrow if no further seizures -Lorazepam 2 mg PRN for seizures lasting >5 minutes -Continue seizure precautions Encephalopathy Still with symptoms of encephalopathy.  Oriented to person and place, but not date/time.  This morning able to answer some questions appropriately, but other questions cannot answer and just stares, covers herself up with that she.  Ammonia level 35. -Delirium precautions -Seizure management as above Chronic health problem HTN- continue home losartan 100 mg Asthma-albuterol inhaler as needed   FEN/GI: Regular diet PPx: Lovenox Dispo:Home pending clinical improvement . Barriers include medication titration.   Subjective:  Patient seen this morning lying in bed covered up with she.  She removes that sheet for exam.  Reports she has no appetite.  Denies pain.  Objective: Temp:  [98 F (36.7 C)-98.5 F (36.9 C)] 98.2 F (36.8 C) (03/10 1100) Pulse Rate:  [60-71]  70 (03/10 1100) Resp:  [16-18] 18 (03/10 1100) BP: (100-135)/(73-95) 100/73 (03/10 1100) SpO2:  [99 %-100 %] 100 % (03/10 0755) Physical Exam: General: Well-appearing, EEG leads in place, NAD. Cardiovascular: RRR Respiratory: CTA anteriorly, no increased work of breathing Abdomen: Soft, nontender  Laboratory: Most recent CBC Lab Results  Component Value Date   WBC 4.9 02/04/2024   HGB 14.4 02/04/2024   HCT 44.1 02/04/2024   MCV 87.5 02/04/2024   PLT 247 02/04/2024   Most recent BMP    Latest Ref Rng & Units 02/04/2024   11:06 AM  BMP  Glucose 70 - 99 mg/dL 99   BUN 6 - 20 mg/dL 12   Creatinine 1.47 - 1.00 mg/dL 8.29   Sodium 562 - 130 mmol/L 137   Potassium 3.5 - 5.1 mmol/L 3.6   Chloride 98 - 111 mmol/L 105   CO2 22 - 32 mmol/L 22   Calcium 8.9 - 10.3 mg/dL 9.8    Ammonia 35 VPA 865  Cyndia Skeeters, DO 02/04/2024, 1:03 PM  PGY-1, [facility] Family Medicine FPTS Intern pager: [phone], text pages welcome Secure chat group [facility] Teaching Service

## 2024-02-04 NOTE — Assessment & Plan Note (Addendum)
 Still with symptoms of encephalopathy.  Oriented to person and place, but not date/time.  This morning able to answer some questions appropriately, but other questions cannot answer and just stares, covers herself up with that she.  Ammonia level 35. -Delirium precautions -Seizure management as above

## 2024-02-04 NOTE — Plan of Care (Signed)
 Did get up with daughter and almost fell.  Had to be reminded that she can only get up with staff.  Daughter was just trying to help her to the commode and does understand that can only be up with staff.    Problem: Clinical Measurements: Goal: Will remain free from infection Outcome: Progressing   Problem: Activity: Goal: Risk for activity intolerance will decrease Outcome: Progressing   Problem: Coping: Goal: Level of anxiety will decrease Outcome: Progressing   Problem: Safety: Goal: Ability to remain free from injury will improve Outcome: Progressing   Problem: Skin Integrity: Goal: Risk for impaired skin integrity will decrease Outcome: Progressing   Problem: Coping: Goal: Ability to adjust to condition or change in health will improve Outcome: Progressing   Problem: Health Behavior/Discharge Planning: Goal: Compliance with prescribed medication regimen will improve Outcome: Progressing   Problem: Medication: Goal: Risk for medication side effects will decrease Outcome: Progressing

## 2024-02-04 NOTE — Assessment & Plan Note (Signed)
 HTN- continue home losartan 100 mg Asthma-albuterol inhaler as needed

## 2024-02-04 NOTE — TOC Initial Note (Signed)
 Transition of Care [facility]) - Initial/Assessment Note    Patient Details  Name: Tammy [name] MRN: [id number] Date of Birth: May 05, 1966  [facility] of [facility]) CM/SW Contact:    Cherre Blanc, RN Phone Number: 02/04/2024, 2:33 PM  Clinical Narrative:                 Patient lives alone but her daughter checks-in on her and will provide more frequent checks once she is discharged. Patient has DME in house, including a Cane, RW, [facility], Paediatric nurse. Daughter provides transportation and assists with medication management. Patient states she has a PCP. TOC CM will continue to follow to assist with DC Planning.  Expected Discharge Plan: Home/Self Care Barriers to Discharge: Continued Medical Work up   Patient Goals and CMS Choice            Expected Discharge Plan and Services                                              Prior Living Arrangements/Services   Lives with:: Self (Daughter checks in on her and can provide frequent checks post discharge)              Current home services: DME (cane, walker, shower chair, wheel chair)    Activities of Daily Living      Permission Sought/Granted                  Emotional Assessment Appearance:: Appears stated age Attitude/Demeanor/Rapport: Engaged Affect (typically observed): Appropriate Orientation: : Oriented to Self, Oriented to Place, Oriented to  Time, Oriented to Situation   Psych Involvement: No (comment)  Admission diagnosis:  Seizure (HCC) [R56.9] Seizures (HCC) [R56.9] Patient Active Problem List   Diagnosis Date Noted   Chronic health problem 02/04/2024   Seizures (HCC) 02/01/2024   Encephalopathy 02/01/2024   Varicose veins of leg with edema, 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 [address], [address] [place] [address] Phone: [phone] Fax: [phone]  [id number] - [place], [place] - [address] AT [facility] [address] [place] [address] Phone: [phone] Fax: [phone]  [facility] [place] [facility] [address] [place] [address] Phone: [phone] Fax: [phone]  [id number] - [place], [place] - [address] [place] [address] Phone: [phone] Fax: [phone]  [id number] - [place], [place] - [address] AT [facility] [address] [place] [address] Phone: [phone] Fax: [phone]  [facility] [address] [place] [address] Phone: [phone] Fax: [phone]     Social Drivers of Health (SDOH) Social History: SDOH Screenings   Depression (PHQ2-9): Low Risk  (06/01/2023)  Tobacco Use: Low Risk  (02/01/2024)   SDOH Interventions:     Readmission Risk Interventions     No data to display

## 2024-02-04 NOTE — Plan of Care (Signed)

## 2024-02-04 NOTE — Progress Notes (Signed)
 LTM maint complete - no skin breakdown under: ZO1,WR6,E4

## 2024-02-05 ENCOUNTER — Inpatient Hospital Stay (HOSPITAL_COMMUNITY)

## 2024-02-05 ENCOUNTER — Other Ambulatory Visit (HOSPITAL_COMMUNITY): Payer: Self-pay

## 2024-02-05 ENCOUNTER — Telehealth (HOSPITAL_COMMUNITY): Payer: Self-pay | Admitting: Pharmacy Technician

## 2024-02-05 DIAGNOSIS — G40909 Epilepsy, unspecified, not intractable, without status epilepticus: Secondary | ICD-10-CM

## 2024-02-05 DIAGNOSIS — I10 Essential (primary) hypertension: Secondary | ICD-10-CM | POA: Diagnosis not present

## 2024-02-05 DIAGNOSIS — G40101 Localization-related (focal) (partial) symptomatic epilepsy and epileptic syndromes with simple partial seizures, not intractable, with status epilepticus: Secondary | ICD-10-CM | POA: Diagnosis not present

## 2024-02-05 DIAGNOSIS — R569 Unspecified convulsions: Secondary | ICD-10-CM | POA: Diagnosis not present

## 2024-02-05 DIAGNOSIS — G9340 Encephalopathy, unspecified: Secondary | ICD-10-CM | POA: Diagnosis not present

## 2024-02-05 DIAGNOSIS — M6282 Rhabdomyolysis: Secondary | ICD-10-CM

## 2024-02-05 NOTE — Telephone Encounter (Signed)
 Patient Product/process development scientist completed.    The patient is insured through [employer] [place].     Ran test claim for Valtoco 15 mg and the current 30 day co-pay is $4.00.  Ran test claim for Valtoco 20 mg and the current 30 day co-pay is $4.00.  This test claim was processed through [facility]- copay amounts may vary at other pharmacies due to pharmacy/plan contracts, or as the patient moves through the different stages of their insurance plan.     Roland Earl, CPHT Pharmacy Technician III Certified Patient Advocate [facility] Patient Advocate Team Direct Number: [phone]  Fax: [phone]

## 2024-02-05 NOTE — Procedures (Addendum)
 Patient Name: Tammy [name]  MRN: [id number]  Epilepsy Attending: Charlsie Quest  Referring Physician/Provider: Rejeana Brock, MD  Duration: 02/04/2024 1713 to 02/05/2024 0946   Patient history: 58 y.o. female with a history of left temporal lobe epilepsy who presents with multiple partial seizures which progressed to partial status epilepticus while in the emergency department. EEG to evaluate for seizure   Level of alertness: Awake, asleep   AEDs during EEG study: LEV, VPA, TPM, Clonazepam   Technical aspects: This EEG study was done with scalp electrodes positioned according to the 10-20 International system of electrode placement. Electrical activity was reviewed with band pass filter of 1-70Hz , sensitivity of 7 uV/mm, display speed of 62mm/sec with a 60Hz  notched filter applied as appropriate. EEG data were recorded continuously and digitally stored.  Video monitoring was available and reviewed as appropriate.   Description: EEG showed continuous generalized and maximal right frontal 3 to 6 Hz theta-delta slowing. Independent sharp waves were noted in left anterior temporal region and right frontal region. Sleep was characterized by sleep spindles (12-14hz  ), maximal fronto-central region. Hyperventilation and photic stimulation were not performed.       ABNORMALITY - Sharp wave, right frontal region - Sharp wave, left anterior temporal region - Continuous slow, generalized and maximal right frontal   IMPRESSION: This study showed evidence of independent epileptogenicity arising from right frontal region and left anterior temporal region.  Additionally there is cortical dysfunction in right frontal region likely secondary to underlying structural abnormality, postictal state.  Lastly there is moderate diffuse encephalopathy.  No seizures are noted.   Vannak Montenegro [name]

## 2024-02-05 NOTE — Assessment & Plan Note (Signed)
 HTN- continue home losartan 100 mg Asthma-albuterol inhaler as needed

## 2024-02-05 NOTE — Assessment & Plan Note (Addendum)
 Neurologic status essentially the same last few days, no seizures noted overnight on LTM EEG. -Neurology consulted, appreciate their recommendations as below - Continue Depakote to 500 mg twice daily - Continue Topamax 200 mg twice daily - Continue 1500 mg twice daily - MRI brain per neurology, if no acute changes will likely be ready for discharge from neurology perspective.  -Lorazepam 2 mg PRN for seizures lasting >5 minutes -Continue seizure precautions

## 2024-02-05 NOTE — Progress Notes (Signed)
     Daily Progress Note Intern Pager: [phone]  Patient name: Tammy [name] Medical record number: [id number] Date of birth: Jun 25, 1966 Age: 58 y.o. Gender: female  Primary Care Provider: Marcine Matar, MD Consultants: Neurology Code Status: Full  Pt Overview and Major Events to Date:  3/8-admitted 3/8 - 3/10-LTM EEG  Assessment and Plan: [place] is a 58 year old female with known history of epilepsy, admitted in status epilepticus finally resolved with Depakote.  Awaiting MRI brain today and may be appropriate for discharge.  Assessment & Plan Seizures (HCC) Neurologic status essentially the same last few days, no seizures noted overnight on LTM EEG. -Neurology consulted, appreciate their recommendations as below - Continue Depakote to 500 mg twice daily - Continue Topamax 200 mg twice daily - Continue 1500 mg twice daily - MRI brain per neurology, if no acute changes will likely be ready for discharge from neurology perspective.  -Lorazepam 2 mg PRN for seizures lasting >5 minutes -Continue seizure precautions Encephalopathy Still with symptoms of encephalopathy.  Continues to be oriented to person and place, but not date/time.  This morning seen while standing with her brother, who does not offer any history or have any questions at this time. Per nursing note earlier this morning almost fell overnight when trying to go to the bathroom with daughter's help; patient does not remember this event. -Delirium precautions -Seizure management as above Chronic health problem HTN- continue home losartan 100 mg Asthma-albuterol inhaler as needed   FEN/GI: Regular diet, strict I&O's as patient has had poor PO intake PPx: Lovenox Dispo:Home pending clinical improvement . Barriers include improvement in encephalopathy.   Subjective:  Patient seen this morning sitting up in bed on FaceTime with her brother.  She continues to be somewhat encephalopathic.  Reports she ate  a salad for breakfast and gestures to empty bedside tray.  Does not remember trying to get up in the middle of the night to go to the bathroom and almost falling.  Objective: Temp:  [97.5 F (36.4 C)-98.2 F (36.8 C)] 98 F (36.7 C) (03/11 0721) Pulse Rate:  [58-74] 58 (03/11 0721) Resp:  [17-20] 20 (03/11 0721) BP: (91-117)/(65-83) 117/77 (03/11 0721) SpO2:  [94 %-100 %] 100 % (03/11 0721) Weight:  [56.6 kg] 56.6 kg (03/11 0600) Physical Exam: General: Pleasant, NAD Cardiovascular: RRR Respiratory: No increased work of breathing on room air Extremities: Moves all equally  Laboratory: Most recent CBC Lab Results  Component Value Date   WBC 4.9 02/04/2024   HGB 14.4 02/04/2024   HCT 44.1 02/04/2024   MCV 87.5 02/04/2024   PLT 247 02/04/2024   Most recent BMP    Latest Ref Rng & Units 02/04/2024   11:06 AM  BMP  Glucose 70 - 99 mg/dL 99   BUN 6 - 20 mg/dL 12   Creatinine 5.18 - 1.00 mg/dL 8.41   Sodium 660 - 630 mmol/L 137   Potassium 3.5 - 5.1 mmol/L 3.6   Chloride 98 - 111 mmol/L 105   CO2 22 - 32 mmol/L 22   Calcium 8.9 - 10.3 mg/dL 9.8      Cyndia Skeeters, DO 02/05/2024, 7:48 AM  PGY-1, [facility] FPTS Intern pager: [phone], text pages welcome Secure chat group [facility] Teaching Service

## 2024-02-05 NOTE — Assessment & Plan Note (Addendum)
 Still with symptoms of encephalopathy.  Continues to be oriented to person and place, but not date/time.  This morning seen while standing with her brother, who does not offer any history or have any questions at this time. Per nursing note earlier this morning almost fell overnight when trying to go to the bathroom with daughter's help; patient does not remember this event. -Delirium precautions -Seizure management as above

## 2024-02-05 NOTE — Plan of Care (Signed)
 Family has concerns and wants to speak with the doctor and the case manager about her going home and having some home health services. I did remind them that the doctor ordered PT and OT to help assess and evaluate her needs and to help with the discharge process and meeting her home care needs.     Problem: Clinical Measurements: Goal: Will remain free from infection Outcome: Progressing   Problem: Clinical Measurements: Goal: Respiratory complications will improve Outcome: Progressing   Problem: Activity: Goal: Risk for activity intolerance will decrease Outcome: Progressing   Problem: Coping: Goal: Level of anxiety will decrease Outcome: Progressing   Problem: Safety: Goal: Ability to remain free from injury will improve Outcome: Progressing   Problem: Coping: Goal: Ability to adjust to condition or change in health will improve Outcome: Progressing

## 2024-02-05 NOTE — Progress Notes (Signed)
 Subjective: No acute events overnight.  Was excited to hear that she has not had any more seizures but was disappointed to hear that she will still need an MRI brain and PT /OT eval  ROS: negative except above  Examination  Vital signs in last 24 hours: Temp:  [97.5 F (36.4 C)-98.1 F (36.7 C)] 98.1 F (36.7 C) (03/11 1107) Pulse Rate:  [58-74] 64 (03/11 1107) Resp:  [17-20] 18 (03/11 1107) BP: (91-120)/(65-78) 120/76 (03/11 1107) SpO2:  [94 %-100 %] 100 % (03/11 1107) Weight:  [56.6 kg] 56.6 kg (03/11 0600)  General: lying in bed, NAD Neuro: MS: Alert, oriented, follows commands, able to name objects and do simple math CN: pupils equal and reactive,  EOMI, face symmetric, tongue midline, normal sensation over face, Motor: 5/5 strength in all 4 extremities Coordination: normal Gait: not tested  Basic Metabolic Panel: Recent Labs  Lab 02/01/24 0241 02/01/24 1236 02/02/24 0027 02/04/24 1106  NA 140 139 135 137  K 3.6 3.4* 4.0 3.6  CL 109 105 105 105  CO2 23 15* 19* 22  GLUCOSE 93 129* 104* 99  BUN 14 14 12 12   CREATININE 0.83 1.01* 0.83 0.84  CALCIUM 9.0 9.7 9.2 9.8    CBC: Recent Labs  Lab 02/01/24 0241 02/01/24 1236 02/04/24 1106  WBC 5.7 7.7 4.9  NEUTROABS 3.6  --   --   HGB 12.0 14.2 14.4  HCT 37.5 46.2* 44.1  MCV 90.4 93.7 87.5  PLT 233 278 247     Coagulation Studies: No results for input(s): "LABPROT", "INR" in the last 72 hours.  Imaging No new brain imaging overnight     ASSESSMENT AND PLAN: 58 year old female with history of epilepsy who presented with refractory status epilepticus that finally resolved with Depakote.   Status epilepticus, resolved Rhabdomyolysis due to seizures -Could be secondary to missing Topamax.  Depakote level was only 31 on arrival.  UDS only positive for THC.  No UTI   Recommendations -Continue Depakote to 500 mg twice daily, Klonopin 0.5 mg twice daily, Topamax 200 mg twice daily and Keppra 1500 mg twice  daily. -DC LTM EEG -MRI brain without contrast as patient presented with status and no acute abnormality on CT -PT/OT -Rescue medication: Intranasal Valtoco 15 mg for seizure lasting more than 2 minutes -Continue seizure precautions -Follow-up with Dr. Karel Jarvis at [facility] neurology -Discussed plan with medicine team  Seizure precautions: Per [facility] statutes, patients with seizures are not allowed to drive until they have been seizure-free for six months and cleared by a physician    Use caution when using heavy equipment or power tools. Avoid working on ladders or at heights. Take showers instead of baths. Ensure the water temperature is not too high on the home water heater. Do not go swimming alone. Do not lock yourself in a room alone (i.e. bathroom). When caring for infants or small children, sit down when holding, feeding, or changing them to minimize risk of injury to the child in the event you have a seizure. Maintain good sleep hygiene. Avoid alcohol.    If patient has another seizure, call 911 and bring them back to the ED if: A.  The seizure lasts longer than 5 minutes.      B.  The patient doesn't wake shortly after the seizure or has new problems such as difficulty seeing, speaking or moving following the seizure C.  The patient was injured during the seizure D.  The patient has a  temperature over 102 F (39C) E.  The patient vomited during the seizure and now is having trouble breathing    During the Seizure   - First, ensure adequate ventilation and place patients on the floor on their left side  Loosen clothing around the neck and ensure the airway is patent. If the patient is clenching the teeth, do not force the mouth open with any object as this can cause severe damage - Remove all items from the surrounding that can be hazardous. The patient may be oblivious to what's happening and may not even know what he or she is doing. If the patient is confused and  wandering, either gently guide him/her away and block access to outside areas - Reassure the individual and be comforting - Call 911. In most cases, the seizure ends before EMS arrives. However, there are cases when seizures may last over 3 to 5 minutes. Or the individual may have developed breathing difficulties or severe injuries. If a pregnant patient or a person with diabetes develops a seizure, it is prudent to call an ambulance.     After the Seizure (Postictal Stage)   After a seizure, most patients experience confusion, fatigue, muscle pain and/or a headache. Thus, one should permit the individual to sleep. For the next few days, reassurance is essential. Being calm and helping reorient the person is also of importance.   Most seizures are painless and end spontaneously. Seizures are not harmful to others but can lead to complications such as stress on the lungs, brain and the heart. Individuals with prior lung problems may develop labored breathing and respiratory distress.          I have spent a total of 35 minutes with the patient reviewing hospital notes,  test results, labs and examining the patient as well as establishing an assessment and plan that was discussed personally with the patient.  > 50% of time was spent in direct patient care.   Tammy [name] Epilepsy [facility] For questions after 5pm please refer to AMION to reach the Neurologist on call

## 2024-02-05 NOTE — Progress Notes (Signed)
 LTM EEG discontinued - no skin breakdown at [facility].

## 2024-02-05 NOTE — Evaluation (Signed)
 Occupational Therapy Evaluation Patient Details Name: Tammy [name] MRN: [id number] DOB: 1966/10/05 Today's Date: 02/05/2024   History of Present Illness   Pt is a 58 y.o. female presenting seizure 02/01/2024. Suspect medication non-adherence. EEG revealed seizure arising from R frontal region x3 and moderate diffuse encephalopathy. PMH significant for left temporal lobe epilepsy     Clinical Impressions PTA, pt previously lived alone with intermittent assist from daughter for medication management. Daughter has been staying for past two weeks and plans to continue staying with pt after discharge. Pt sister and son also available to assist as needed. On eval, pt with decreased cognition, safety, awareness, balance, and generalized weakness. Pt needing grossly CGA-min A for ADL and min cues for attention to task and completion of tasks throughout session. Called daughter who confirms she can provide 24/7 assist and assist with medication management as pt with decreased memory and poor historian at eval. Will continue to follow and recommending HHOT at discharge.      If plan is discharge home, recommend the following:   A little help with walking and/or transfers;A lot of help with bathing/dressing/bathroom;A little help with bathing/dressing/bathroom;Assistance with cooking/housework;Direct supervision/assist for medications management;Direct supervision/assist for financial management;Assist for transportation;Help with stairs or ramp for entrance;Supervision due to cognitive status     Functional Status Assessment   Patient has had a recent decline in their functional status and demonstrates the ability to make significant improvements in function in a reasonable and predictable amount of time.     Equipment Recommendations   BSC/3in1     Recommendations for Other Services         Precautions/Restrictions   Precautions Precautions: Fall Restrictions Weight Bearing  Restrictions Per Provider Order: No     Mobility Bed Mobility Overal bed mobility: Needs Assistance Bed Mobility: Supine to Sit, Sit to Supine     Supine to sit: Contact guard Sit to supine: Contact guard assist        Transfers Overall transfer level: Needs assistance Equipment used: Rolling walker (2 wheels), None Transfers: Sit to/from Stand Sit to Stand: Contact guard assist           General transfer comment: increased time and close guard      Balance Overall balance assessment: Needs assistance Sitting-balance support: No upper extremity supported, Feet supported Sitting balance-Leahy Scale: Fair     Standing balance support: Single extremity supported, Bilateral upper extremity supported, No upper extremity supported, During functional activity Standing balance-Leahy Scale: Poor                             ADL either performed or assessed with clinical judgement   ADL Overall ADL's : Needs assistance/impaired Eating/Feeding: Set up;Sitting   Grooming: Standing;Contact guard assist;Minimal assistance   Upper Body Bathing: Set up;Sitting   Lower Body Bathing: Sit to/from stand;Contact guard assist   Upper Body Dressing : Set up;Sitting   Lower Body Dressing: Sit to/from stand;Contact guard assist   Toilet Transfer: Minimal assistance;Ambulation;Rolling walker (2 wheels)   Toileting- Clothing Manipulation and Hygiene: Contact guard assist;Sitting/lateral lean Toileting - Clothing Manipulation Details (indicate cue type and reason): for anterior pericare     Functional mobility during ADLs: Contact guard assist;Minimal assistance;Rolling walker (2 wheels)       Vision Ability to See in Adequate Light: 0 Adequate Patient Visual Report:  (inconsistent report) Vision Assessment?: No apparent visual deficits Additional Comments: able to read from signage on  wall and tracking [facility]     Perception         Praxis         Pertinent  Vitals/Pain Pain Assessment Pain Assessment: No/denies pain     Extremity/Trunk Assessment Upper Extremity Assessment Upper Extremity Assessment: Generalized weakness   Lower Extremity Assessment Lower Extremity Assessment: Defer to PT evaluation       Communication Communication Communication: Impaired Factors Affecting Communication:  (difficulty word finding noted 2x during session)   Cognition Arousal: Alert Behavior During Therapy: Flat affect Cognition: Cognition impaired   Orientation impairments: Time Awareness: Intellectual awareness intact, Online awareness impaired ("I just peed on the floor" "whoops" when losing balance. less awareness of cognitive dififculties.) Memory impairment (select all impairments): Short-term memory, Working memory Attention impairment (select first level of impairment): Sustained attention Executive functioning impairment (select all impairments): Organization, Problem solving, Reasoning                   Following commands: Impaired Following commands impaired: Follows one step commands with increased time     Cueing  General Comments   Cueing Techniques: Verbal cues;Gestural cues;Tactile cues  VSS   Exercises     Shoulder Instructions      Home Living Family/patient expects to be discharged to:: Private residence Living Arrangements: Children (daughter has been staying for the past 2 weeks; son and sister also available to help out)   Type of Home: Apartment Home Access: Stairs to enter Secretary/administrator of Steps: 2   Home Layout: One level     Bathroom Shower/Tub: Chief Strategy Officer: Standard     Home Equipment: None   Additional Comments: pads and diapers are kept at home per daugter secondary to incontinence      Prior Functioning/Environment Prior Level of Function : Independent/Modified Independent               ADLs Comments: daughter A w medication management. Pt does not  drive.    OT Problem List: Decreased strength;Decreased activity tolerance;Impaired balance (sitting and/or standing);Decreased cognition;Decreased safety awareness;Decreased knowledge of use of DME or AE   OT Treatment/Interventions: Self-care/ADL training;Therapeutic exercise;DME and/or AE instruction;Therapeutic activities;Patient/family education;Balance training      OT Goals(Current goals can be found in the care plan section)   Acute Rehab OT Goals Patient Stated Goal: go home OT Goal Formulation: With patient Time For Goal Achievement: 02/19/24 Potential to Achieve Goals: Good   OT Frequency:  Min 1X/week    Co-evaluation              AM-PAC OT "6 Clicks" Daily Activity     Outcome Measure Help from another person eating meals?: A Little Help from another person taking care of personal grooming?: A Little Help from another person toileting, which includes using toliet, bedpan, or urinal?: A Little Help from another person bathing (including washing, rinsing, drying)?: A Little Help from another person to put on and taking off regular upper body clothing?: A Little Help from another person to put on and taking off regular lower body clothing?: A Little 6 Click Score: 18   End of Session Equipment Utilized During Treatment: Gait belt;Rolling walker (2 wheels) Nurse Communication: Mobility status  Activity Tolerance: Patient tolerated treatment well Patient left: in bed;with call bell/phone within reach;with bed alarm set  OT Visit Diagnosis: Unsteadiness on feet (R26.81);Muscle weakness (generalized) (M62.81);Other symptoms and signs involving cognitive function  Time: 6962-9528 OT Time Calculation (min): 36 min Charges:  OT General Charges $OT Visit: 1 Visit OT Evaluation $OT Eval Moderate Complexity: 1 Mod OT Treatments $Self Care/Home Management : 8-22 mins  Tyler Deis, OTR/L [facility] Acute Rehabilitation Office:  [phone]   Tammy [name] 02/05/2024, 2:10 PM

## 2024-02-05 NOTE — Discharge Instructions (Signed)
 Dear Vanita Ingles,   Thank you for letting us participate in your care!  We are so glad you are feeling better.  You were admitted to the hospital because of your seizures.  While you were in the hospital you got medicines to help control your seizures, and you were monitored with an EEG.  Neurology saw you and helped adjust your medications.  It is very important that you continue to follow neurology's instructions on how to take your medicines.   POST-HOSPITAL & CARE INSTRUCTIONS We recommend following up with your PCP within 1 week from being discharged from the hospital. Please let PCP/Specialists know of any changes in medications that were made which you will be able to see in the medications section of this packet. Please also follow up with Dr. Karel Jarvis at [facility] Neurology  DOCTOR'S APPOINTMENTS & FOLLOW UP Future Appointments  Date Time Provider Department Center  03/06/2024 11:00 AM Van Clines, MD [facility] None  03/28/2024  9:50 AM Marcine Matar, MD [facility] None  04/22/2024 10:30 AM Shamleffer, Konrad Dolores, MD [facility] None     Thank you for choosing [facility] Take care and be well!  Family Medicine Teaching Service Inpatient Team   [facility]  [address] [place], [place] [address] [phone]

## 2024-02-05 NOTE — Evaluation (Signed)
 Physical Therapy Evaluation Patient Details Name: Tammy [name] MRN: [id number] DOB: 1966-03-29 Today's Date: 02/05/2024  History of Present Illness  Pt is a 58 y.o. female presenting seizure 02/01/2024. Suspect medication non-adherence. EEG revealed seizure arising from R frontal region x3 and moderate diffuse encephalopathy. PMH significant for left temporal lobe epilepsy  Clinical Impression  Pt admitted with above. Pt presents with impaired cognition and balance. Pt A&Ox3, not oriented to place (reports she is in [employer]). Pt demonstrates decreased safety awareness and insight into deficits. Initially trialed transition to standing with no assistive device; pt with lateral loss of balance requiring external assist by PT to correct. Improved stability with RW. Ambulating 100 ft with cueing for obstacle navigation. Ascended/descended 2 steps to simulate home set up. Discussed with pt daughter who states they prefer d/c home. Recommended 24/7 assist and supervision for all mobility/ADL's using RW. Pt daughter in agreement. Recommend HHPT follow up.        If plan is discharge home, recommend the following: A little help with walking and/or transfers;A little help with bathing/dressing/bathroom;Assistance with cooking/housework;Direct supervision/assist for medications management;Direct supervision/assist for financial management;Assist for transportation;Help with stairs or ramp for entrance;Supervision due to cognitive status   Can travel by private vehicle        Equipment Recommendations Rolling walker (2 wheels)  Recommendations for Other Services       Functional Status Assessment Patient has had a recent decline in their functional status and demonstrates the ability to make significant improvements in function in a reasonable and predictable amount of time.     Precautions / Restrictions Precautions Precautions: Fall Restrictions Weight Bearing Restrictions Per Provider Order:  No      Mobility  Bed Mobility Overal bed mobility: Needs Assistance Bed Mobility: Supine to Sit, Sit to Supine     Supine to sit: Supervision Sit to supine: Supervision        Transfers Overall transfer level: Needs assistance Equipment used: Rolling walker (2 wheels), None Transfers: Sit to/from Stand Sit to Stand: Mod assist, Contact guard assist           General transfer comment: Pt with lateral LOB upon initial sit to stand, requiring modA by therapist to correct. Progressed to CGA for 2nd trial with RW, with cues for hand placement    Ambulation/Gait Ambulation/Gait assistance: Contact guard assist Gait Distance (Feet): 100 Feet Assistive device: Rolling walker (2 wheels) Gait Pattern/deviations: Step-through pattern, Decreased stride length, Narrow base of support, Drifts right/left Gait velocity: decreased     General Gait Details: Pt with drift, running into wall and obstacles, requires cues for correction. CGA for safety  Stairs Stairs: Yes Stairs assistance: Contact guard assist Stair Management: Two rails Number of Stairs: 2 General stair comments: cues for step by step  Wheelchair Mobility     Tilt Bed    Modified Rankin (Stroke Patients Only)       Balance Overall balance assessment: Needs assistance Sitting-balance support: No upper extremity supported, Feet supported Sitting balance-Leahy Scale: Good     Standing balance support: Single extremity supported, Bilateral upper extremity supported, No upper extremity supported, During functional activity Standing balance-Leahy Scale: Poor                               Pertinent Vitals/Pain Pain Assessment Pain Assessment: No/denies pain    Home Living Family/patient expects to be discharged to:: Private residence Living Arrangements: Children (daughter  has been staying for the past 2 weeks; son and sister also available to help out)   Type of Home: Apartment Home  Access: Stairs to enter   Entrance Stairs-Number of Steps: 2   Home Layout: One level Home Equipment: None Additional Comments: pads and diapers are kept at home per daugter secondary to incontinence    Prior Function Prior Level of Function : Independent/Modified Independent               ADLs Comments: daughter A w medication management. Pt does not drive.     Extremity/Trunk Assessment   Upper Extremity Assessment Upper Extremity Assessment: Defer to OT evaluation    Lower Extremity Assessment Lower Extremity Assessment: Overall WFL for tasks assessed    Cervical / Trunk Assessment Cervical / Trunk Assessment: Normal  Communication   Communication Communication: Impaired Factors Affecting Communication:  (difficulty word finding noted 2x during session)    Cognition Arousal: Alert Behavior During Therapy: Flat affect   PT - Cognitive impairments: Orientation, Awareness, Memory, Problem solving, Safety/Judgement   Orientation impairments: Place                   PT - Cognition Comments: Pt reporting she was in [employer]; able to recall she was in [place] after being re-oriented. Pt with decreased safety awareness and insight into deficits. Following commands: Impaired Following commands impaired: Follows one step commands with increased time     Cueing Cueing Techniques: Verbal cues, Gestural cues, Tactile cues     General Comments General comments (skin integrity, edema, etc.): VSS    Exercises     Assessment/Plan    PT Assessment Patient needs continued PT services  PT Problem List Decreased balance;Decreased mobility;Decreased cognition;Decreased safety awareness       PT Treatment Interventions DME instruction;Gait training;Stair training;Functional mobility training;Therapeutic activities;Therapeutic exercise;Balance training;Patient/family education    PT Goals (Current goals can be found in the Care Plan section)  Acute Rehab PT  Goals Patient Stated Goal: go home PT Goal Formulation: With patient/family Time For Goal Achievement: 02/19/24 Potential to Achieve Goals: Good    Frequency Min 3X/week     Co-evaluation               AM-PAC PT "6 Clicks" Mobility  Outcome Measure Help needed turning from your back to your side while in a flat bed without using bedrails?: A Little Help needed moving from lying on your back to sitting on the side of a flat bed without using bedrails?: A Little Help needed moving to and from a bed to a chair (including a wheelchair)?: A Little Help needed standing up from a chair using your arms (e.g., wheelchair or bedside chair)?: A Lot Help needed to walk in hospital room?: A Little Help needed climbing 3-5 steps with a railing? : A Little 6 Click Score: 17    End of Session Equipment Utilized During Treatment: Gait belt Activity Tolerance: Patient tolerated treatment well Patient left: in bed;with call bell/phone within reach;with bed alarm set Nurse Communication: Mobility status PT Visit Diagnosis: Unsteadiness on feet (R26.81)    Time: 1330-1350 PT Time Calculation (min) (ACUTE ONLY): 20 min   Charges:   PT Evaluation $PT Eval Low Complexity: 1 Low   PT General Charges $$ ACUTE PT VISIT: 1 Visit         Lillia Pauls, PT, DPT Acute Rehabilitation Services Office [phone]   Norval Morton 02/05/2024, 3:00 PM

## 2024-02-06 ENCOUNTER — Other Ambulatory Visit (HOSPITAL_COMMUNITY): Payer: Self-pay

## 2024-02-06 DIAGNOSIS — G9340 Encephalopathy, unspecified: Secondary | ICD-10-CM | POA: Diagnosis not present

## 2024-02-06 DIAGNOSIS — R569 Unspecified convulsions: Secondary | ICD-10-CM | POA: Diagnosis not present

## 2024-02-06 MED ORDER — DIVALPROEX SODIUM 500 MG PO DR TAB
500.0000 mg | DELAYED_RELEASE_TABLET | Freq: Two times a day (BID) | ORAL | 0 refills | Status: DC
  Filled 2024-02-06: qty 60, 30d supply, fill #0

## 2024-02-06 MED ORDER — LEVETIRACETAM 750 MG PO TABS
1500.0000 mg | ORAL_TABLET | Freq: Two times a day (BID) | ORAL | 0 refills | Status: DC
  Filled 2024-02-06: qty 120, 30d supply, fill #0

## 2024-02-06 MED ORDER — VALTOCO 15 MG DOSE 2 X 7.5 MG/0.1ML NA LQPK
15.0000 mg | NASAL | 2 refills | Status: AC | PRN
  Filled 2024-02-06: qty 10, 30d supply, fill #0

## 2024-02-06 MED ORDER — CLONAZEPAM 0.5 MG PO TABS
0.5000 mg | ORAL_TABLET | Freq: Two times a day (BID) | ORAL | 0 refills | Status: DC
  Filled 2024-02-06: qty 30, 15d supply, fill #0

## 2024-02-06 NOTE — Progress Notes (Signed)
 Attempted to complete admission documentation, patient refused/declined to answer documentation questions at this time

## 2024-02-06 NOTE — Discharge Summary (Signed)
 Family Medicine Teaching [facility] Discharge Summary  Patient name: Tammy [name] Medical record number: [id number] Date of birth: 12-28-1965 Age: 58 y.o. Gender: female Date of Admission: 02/01/2024  Date of Discharge: 02/06/2024 Admitting Physician: Nelia Shi, MD  Primary Care Provider: Marcine Matar, MD Consultants: Neurology  Indication for Hospitalization: Status epilepticus  Brief Hospital Course:  Tammy [name] is a 58 y.o. female with history of seizure disorder who was admitted for status epilepticus.  Seizure disorder Patient initially presented to the ED on the morning of 02/01/2024 with seizure, was treated with midazolam and sent home in stable good condition.  Patient then arrived evening of 02/01/2024 by EMS for repeat seizure.  She was found to be in status epilepticus and admitted for further workup. GCS 7 initially. Neurology was consulted. Patient was loaded with Keppra and Depakote. Patient required one injection of 2mg  of Ativan for witness seizure. She had long term EEG 3/8-3/10, which showed additional non-clinical seizures which ultimately spaced out and then stopped overnight on 3/10. As such on 3/11 EEG was discontinued. She had brain MRI which showed nonspecific white matter changes that may be seen in the setting of migraine headaches or early chronic small vessel ischemia. Patient mental status improved from presentation, and she was appropriate for discharge with planned f/u with [facility] Neurology.  Other chronic conditions were medically managed with home medications and formulary alternatives as necessary (hypertension).  Follow-up recommendations: 1) follow-up with [place] neurology as scheduled.   Discharge Diagnoses/Problem List:  Active Problems:   Hypertension   Seizures (HCC)   Encephalopathy   Chronic health problem  Disposition: Home  Discharge Condition: Stable  Discharge Exam: General: Seen this morning up and walking  with PT.  Well-appearing, NAD. CV: RRR Respiratory: Normal work of breathing on room air. Extremities: Moves all equally  Significant Procedures: None  Significant Labs and Imaging:  No results for input(s): "WBC", "HGB", "HCT", "PLT" in the last 48 hours. No results for input(s): "NA", "K", "CL", "CO2", "GLUCOSE", "BUN", "CREATININE", "CALCIUM", "MG", "PHOS", "ALKPHOS", "AST", "ALT", "ALBUMIN", "PROTEIN" in the last 48 hours.  -CT head without contrast: Normal brain, no focal lesion or etiology for seizure, small polyp in anterior maxillary sinuses bilaterally.  CXR: No acute cardiopulmonary abnormality.  MRI brain: 1. No acute intracranial abnormality. 2. Minimal multifocal hyperintense T2-weight signal within the white matter, nonspecific but may be seen in the setting of migraine headaches or early chronic small vessel ischemia.  Results/Tests Pending at Time of Discharge: None  Discharge Medications:  Allergies as of 02/06/2024       Reactions   Dilantin [phenytoin] Swelling   Aspirin Other (See Comments)   Patient unsure of reaction    Pork-derived Products Other (See Comments)   Tramadol Itching, Other (See Comments)   Pt states she ins unsure of reaction Pt states she ins unsure of reaction Pt states she ins unsure of reaction Pt states she ins unsure of reaction        Medication List     TAKE these medications    acetaminophen 500 MG tablet Commonly known as: TYLENOL Take 1 tablet (500 mg total) by mouth every 8 (eight) hours as needed.   clonazePAM 0.5 MG tablet Commonly known as: KLONOPIN Take 1 tablet (0.5 mg total) by mouth 2 (two) times daily.   divalproex 500 MG DR tablet Commonly known as: DEPAKOTE Take 1 tablet (500 mg total) by mouth every 12 (twelve) hours. What changed: when to  take this   levETIRAcetam 750 MG tablet Commonly known as: KEPPRA Take 2 tablets (1,500 mg total) by mouth 2 (two) times daily.   losartan 100 MG  tablet Commonly known as: COZAAR Take 1 tablet (100 mg total) by mouth daily.   MULTI-VITAMIN GUMMIES PO Take 1 tablet by mouth daily.   topiramate 200 MG tablet Commonly known as: TOPAMAX Take 1 tablet (200 mg total) by mouth 2 (two) times daily.   Valtoco 15 MG Dose 2 x 7.5 MG/0.1ML Lqpk Generic drug: diazePAM (15 MG Dose) Place 15 mg into the nose as needed (seizure lasting over 2 minutes).   Ventolin HFA 108 (90 Base) MCG/ACT inhaler Generic drug: albuterol Inhale 2 puffs into the lungs every 6 (six) hours as needed for wheezing or shortness of breath.        Discharge Instructions: Please refer to Patient Instructions section of EMR for full details.  Patient was counseled important signs and symptoms that should prompt return to medical care, changes in medications, dietary instructions, activity restrictions, and follow up appointments.   Follow-Up Appointments:  Future Appointments  Date Time Provider Department Center  03/06/2024 11:00 AM Van Clines, MD [facility] None  03/28/2024  9:50 AM Marcine Matar, MD [facility] None  04/22/2024 10:30 AM Shamleffer, Konrad Dolores, MD [facility] None    Cyndia Skeeters, DO 02/06/2024, 12:12 PM PGY-1, [facility] Family [facility]

## 2024-02-06 NOTE — Progress Notes (Signed)
 Physical Therapy Treatment and Discharge Patient Details Name: Tammy [name] MRN: [id number] DOB: [birth year] Today's Date: 02/06/2024   History of Present Illness Pt is a 58 y.o. female presenting seizure 02/01/2024. Suspect medication non-adherence. EEG revealed seizure arising from R frontal region x3 and moderate diffuse encephalopathy. PMH significant for left temporal lobe epilepsy    PT Comments  Patient agreeable to participate with therapy. Patient demonstrating an improvement with functional mobility and cognition (A&Ox4). Under supervision, patient able to transition herself EOB without any assistance. Patient requires CGA for transfers and when ambulating 123ft with and without a RW. Patient demonstrates minimal drifting when ambulating, requiring cues for correction. Patient able to practice toileting independently. Recommend HHPT to follow up to address her remaining impairments.   If plan is discharge home, recommend the following: A little help with walking and/or transfers;A little help with bathing/dressing/bathroom;Assistance with cooking/housework;Direct supervision/assist for medications management;Direct supervision/assist for financial management;Assist for transportation;Help with stairs or ramp for entrance;Supervision due to cognitive status   Can travel by private vehicle        Equipment Recommendations  Rolling walker (2 wheels)    Recommendations for Other Services       Precautions / Restrictions Precautions Precautions: Fall Recall of Precautions/Restrictions: Intact Restrictions Weight Bearing Restrictions Per Provider Order: No     Mobility  Bed Mobility Overal bed mobility: Needs Assistance Bed Mobility: Supine to Sit     Supine to sit: Supervision     General bed mobility comments: Demonstrates good technique when transitioning EOB    Transfers Overall transfer level: Needs assistance Equipment used: Rolling walker (2 wheels),  None Transfers: Sit to/from Stand Sit to Stand: Contact guard assist           General transfer comment: Cues for hand placement    Ambulation/Gait Ambulation/Gait assistance: Contact guard assist Gait Distance (Feet): 100 Feet Assistive device: Rolling walker (2 wheels) Gait Pattern/deviations: Step-through pattern, Decreased stride length, Narrow base of support, Drifts right/left Gait velocity: Decreased     General Gait Details: Pt with minimal drift, running into wall occasionally, requires cues for correction. CGA for safety. Trialed walking without a RW for 62ft   Stairs Stairs: Yes Stairs assistance: Contact guard assist Stair Management: No rails, Forwards Number of Stairs: 2 General stair comments: Ascended/Descended steps with HHA +1, cues for step-negotiation   Wheelchair Mobility     Tilt Bed    Modified Rankin (Stroke Patients Only)       Balance Overall balance assessment: Needs assistance Sitting-balance support: No upper extremity supported, Feet supported Sitting balance-Leahy Scale: Good     Standing balance support: Single extremity supported, Bilateral upper extremity supported, No upper extremity supported, During functional activity Standing balance-Leahy Scale: Fair Standing balance comment: Initial unsteadiness when ambulating without a RW. Used hallway rail intermittently to improve balance                            Communication Communication Communication: No apparent difficulties  Cognition Arousal: Alert Behavior During Therapy: WFL for tasks assessed/performed                             Following commands: Intact      Cueing Cueing Techniques: Verbal cues, Gestural cues  Exercises      General Comments General comments (skin integrity, edema, etc.): BP (Pre-mobility): 110/72 (83). Physcian intervened to discuss medication and d/c  plan      Pertinent Vitals/Pain Pain Assessment Pain  Assessment: No/denies pain    Home Living                          Prior Function            PT Goals (current goals can now be found in the care plan section) Acute Rehab PT Goals Patient Stated Goal: Go home PT Goal Formulation: With patient/family Time For Goal Achievement: 02/19/24 Potential to Achieve Goals: Good Progress towards PT goals: Progressing toward goals    Frequency    Min 3X/week      PT Plan      Co-evaluation              AM-PAC PT "6 Clicks" Mobility   Outcome Measure  Help needed turning from your back to your side while in a flat bed without using bedrails?: A Little Help needed moving from lying on your back to sitting on the side of a flat bed without using bedrails?: A Little Help needed moving to and from a bed to a chair (including a wheelchair)?: A Little Help needed standing up from a chair using your arms (e.g., wheelchair or bedside chair)?: A Little Help needed to walk in hospital room?: A Little Help needed climbing 3-5 steps with a railing? : A Little 6 Click Score: 18    End of Session Equipment Utilized During Treatment: Gait belt Activity Tolerance: Patient tolerated treatment well Patient left: in chair;with call bell/phone within reach;with chair alarm set Nurse Communication: Mobility status PT Visit Diagnosis: Unsteadiness on feet (R26.81)     Time: 3762-8315 PT Time Calculation (min) (ACUTE ONLY): 42 min  Charges:    $Therapeutic Activity: 38-52 mins PT General Charges $$ ACUTE PT VISIT: 1 Visit                     Doreen Beam, SPT   Adreyan Carbajal 02/06/2024, 11:24 AM

## 2024-02-06 NOTE — Progress Notes (Signed)
 Volunteers called for patient to wheel off unit. Informed to stop by [facility] pharmacy to retrieve meds.

## 2024-02-06 NOTE — Plan of Care (Signed)

## 2024-02-06 NOTE — Progress Notes (Signed)
 DISCHARGE NOTE HOME [place] to be discharged Home per MD order. Discussed prescriptions and follow up appointments with the patient. Prescriptions given to patient; medication list explained in detail. Patient verbalized understanding.  Skin clean, dry and intact without evidence of skin break down, no evidence of skin tears noted. IV catheter discontinued intact. Site without signs and symptoms of complications. Dressing and pressure applied. Pt denies pain at the site currently. No complaints noted.  Patient free of lines, drains, and wounds.   An After Visit Summary (AVS) was printed and given to the patient. Patient escorted via wheelchair, and discharged home via private auto.  Velia Meyer, RN

## 2024-02-06 NOTE — TOC Transition Note (Signed)
 Transition of Care [facility]) - Discharge Note   Patient Details  Name: Tammy [name] MRN: [id number] Date of Birth: [birth year]  [facility] CM/SW Contact:  Kermit Balo, RN Phone Number: 02/06/2024, 12:34 PM   Clinical Narrative:     Pt is discharging home with her daughter. Daughter transporting home.  Home health arranged with [facility]. Information on the AVS. [facility] will contact her for the first home visit.  Walker for home obtained from d/c lounge and delivered to the room through [employer].   Final next level of care: Home w Home Health Services Barriers to Discharge: No Barriers Identified   Patient Goals and CMS Choice   CMS Medicare.gov Compare Post Acute Care list provided to:: Patient Choice offered to / list presented to : Patient      Discharge Placement                       Discharge Plan and Services Additional resources added to the After Visit Summary for                  DME Arranged: Walker rolling DME Agency: [employer] Date DME Agency Contacted: 02/06/24   Representative spoke with at DME Agency: dc lounge HH Arranged: PT, OT HH Agency: [facility] Date [facility] Agency Contacted: 02/06/24   Representative spoke with at [facility] Agency: Amy  Social Drivers of Health (SDOH) Interventions SDOH Screenings   Depression (PHQ2-9): Low Risk  (06/01/2023)  Tobacco Use: Low Risk  (02/01/2024)     Readmission Risk Interventions     No data to display

## 2024-02-07 DIAGNOSIS — G43909 Migraine, unspecified, not intractable, without status migrainosus: Secondary | ICD-10-CM | POA: Diagnosis not present

## 2024-02-07 DIAGNOSIS — I10 Essential (primary) hypertension: Secondary | ICD-10-CM | POA: Diagnosis not present

## 2024-02-07 DIAGNOSIS — J45909 Unspecified asthma, uncomplicated: Secondary | ICD-10-CM | POA: Diagnosis not present

## 2024-02-07 DIAGNOSIS — Z8616 Personal history of COVID-19: Secondary | ICD-10-CM | POA: Diagnosis not present

## 2024-02-07 DIAGNOSIS — Z86718 Personal history of other venous thrombosis and embolism: Secondary | ICD-10-CM | POA: Diagnosis not present

## 2024-02-07 DIAGNOSIS — Z9181 History of falling: Secondary | ICD-10-CM | POA: Diagnosis not present

## 2024-02-07 DIAGNOSIS — G40901 Epilepsy, unspecified, not intractable, with status epilepticus: Secondary | ICD-10-CM | POA: Diagnosis not present

## 2024-02-07 DIAGNOSIS — G9340 Encephalopathy, unspecified: Secondary | ICD-10-CM | POA: Diagnosis not present

## 2024-02-08 ENCOUNTER — Telehealth: Payer: Self-pay

## 2024-02-08 LAB — GLUCOSE, CAPILLARY: Glucose-Capillary: 73 mg/dL (ref 70–99)

## 2024-02-08 NOTE — Telephone Encounter (Signed)
 Spoke with Tammy [name] that [facility] order can not be given until patient obtains  earlier appointment with PCP.

## 2024-02-08 NOTE — Telephone Encounter (Signed)
 Copied from CRM [id number]. Topic: Clinical - Home Health Verbal Orders >> Feb 07, 2024  1:34 PM Carlatta H wrote: Caller/Agency: [facility] Number: [phone] Service Requested: Physical Therapy Frequency: 1 week 1, 2 week 1, 1 week 2 due to hospital discharge Any new concerns about the patient? No

## 2024-02-11 ENCOUNTER — Telehealth: Payer: Self-pay

## 2024-02-11 ENCOUNTER — Telehealth: Payer: Self-pay | Admitting: Internal Medicine

## 2024-02-11 NOTE — Telephone Encounter (Signed)
 Please see patient's message.  Can you find out from the caseworker at the hospital whether she was supposed to have home physical therapy?  On the discharge note, I see that physical therapy had gotten her up and walked her in the hall prior to discharge.  If it was not prescribed from the hospital, I think I would need to see her in order to order this for her.

## 2024-02-11 NOTE — Transitions of Care (Post Inpatient/ED Visit) (Signed)
   02/11/2024  Name: Tammy [name] MRN: [id number] DOB: [birth year]  Today's TOC FU Call Status: Today's TOC FU Call Status:: Unsuccessful Call (1st Attempt) Unsuccessful Call (1st Attempt) Date: 02/11/24  Attempted to reach the patient regarding the most recent Inpatient/ED visit.  Follow Up Plan: Additional outreach attempts will be made to reach the patient to complete the Transitions of Care (Post Inpatient/ED visit) call.   Signature  Robyne Peers, RN

## 2024-02-11 NOTE — Telephone Encounter (Signed)
 Copied from CRM [id number]. Topic: Referral - Question >> Feb 11, 2024 11:29 AM DeAngela L wrote: Reason for CRM: Patient just got out of the hospital on 02/05/24 and was told she would need physical therapy after having a seizure and wants to ask if Dr Laural Benes could write her a referral for the physical therapist patient would like a call back [phone] or the other number [phone]

## 2024-02-12 ENCOUNTER — Telehealth: Payer: Self-pay

## 2024-02-12 ENCOUNTER — Telehealth: Payer: Self-pay | Admitting: Internal Medicine

## 2024-02-12 ENCOUNTER — Ambulatory Visit: Payer: Self-pay | Admitting: Internal Medicine

## 2024-02-12 DIAGNOSIS — Z86718 Personal history of other venous thrombosis and embolism: Secondary | ICD-10-CM | POA: Diagnosis not present

## 2024-02-12 DIAGNOSIS — I10 Essential (primary) hypertension: Secondary | ICD-10-CM | POA: Diagnosis not present

## 2024-02-12 DIAGNOSIS — J45909 Unspecified asthma, uncomplicated: Secondary | ICD-10-CM | POA: Diagnosis not present

## 2024-02-12 DIAGNOSIS — Z8616 Personal history of COVID-19: Secondary | ICD-10-CM | POA: Diagnosis not present

## 2024-02-12 DIAGNOSIS — G40901 Epilepsy, unspecified, not intractable, with status epilepticus: Secondary | ICD-10-CM | POA: Diagnosis not present

## 2024-02-12 DIAGNOSIS — G9340 Encephalopathy, unspecified: Secondary | ICD-10-CM | POA: Diagnosis not present

## 2024-02-12 DIAGNOSIS — G43909 Migraine, unspecified, not intractable, without status migrainosus: Secondary | ICD-10-CM | POA: Diagnosis not present

## 2024-02-12 DIAGNOSIS — Z9181 History of falling: Secondary | ICD-10-CM | POA: Diagnosis not present

## 2024-02-12 NOTE — Transitions of Care (Post Inpatient/ED Visit) (Signed)
   02/12/2024  Name: Tammy [name] MRN: [id number] DOB: [birth year]  Today's TOC FU Call Status: Today's TOC FU Call Status:: Unsuccessful Call (2nd Attempt) Unsuccessful Call (1st Attempt) Date: 02/11/24 Unsuccessful Call (2nd Attempt) Date: 02/12/24  Attempted to reach the patient regarding the most recent Inpatient/ED visit.  Follow Up Plan: Additional outreach attempts will be made to reach the patient to complete the Transitions of Care (Post Inpatient/ED visit) call.   Signature  Robyne Peers, RN

## 2024-02-12 NOTE — Telephone Encounter (Signed)
 noted

## 2024-02-12 NOTE — Telephone Encounter (Signed)
 Error

## 2024-02-12 NOTE — Telephone Encounter (Signed)
  Chief Complaint: Information Only Call  Disposition: [] ED /[] Urgent Care (no appt availability in office) / [] Appointment(In office/virtual)/ []  [facility]/ [] Home Care/ [] Refused Recommended Disposition /[] [place] [facility]/ [x]  Follow-up with PCP Additional Notes: AG is being triaged for an inquiry on physical therapy and insurance correlation. The patient ultimately wanted to notify Dr. Laural Benes that Physical Therapy is coming out around 1-130 today. Also assisted patient with MyChart Access at this time.

## 2024-02-12 NOTE — Telephone Encounter (Signed)
 FYI, The patient is retuning your call.

## 2024-02-13 ENCOUNTER — Telehealth: Payer: Self-pay

## 2024-02-13 ENCOUNTER — Telehealth: Payer: Self-pay | Admitting: *Deleted

## 2024-02-13 NOTE — Telephone Encounter (Signed)
 Copied from CRM [id number]. Topic: Clinical - Home Health Verbal Orders >> Feb 12, 2024  4:44 PM Fuller Mandril wrote: Caller/Agency: Will with  [facility]  Callback Number: [phone] Service Requested: Occupational Therapy Frequency: 1 time per week for 4 weeks  Any new concerns about the patient? No

## 2024-02-13 NOTE — Telephone Encounter (Signed)
 The patient is requesting a tub seat.  I told her that Dr Laural Benes can address this at her appointment on 02/18/2024 as we will need documentation from PCP addressing this need

## 2024-02-13 NOTE — Transitions of Care (Post Inpatient/ED Visit) (Signed)
   02/13/2024  Name: Tammy [name] MRN: [id number] DOB: 1966/09/19  Today's TOC FU Call Status: Today's TOC FU Call Status:: Successful TOC FU Call Completed Unsuccessful Call (1st Attempt) Date: 02/11/24 Unsuccessful Call (2nd Attempt) Date: 02/12/24 Unsuccessful Call (3rd Attempt) Date: 02/13/24 [facility] FU Call Complete Date: 02/13/24 [name] and Date of Birth confirmed.  Transition Care Management Follow-up Telephone Call Date of Discharge: 02/06/24 Discharge Facility: [facility]) Type of Discharge: Inpatient Admission Primary Inpatient Discharge Diagnosis:: status epilepticus How have you been since you were released from the hospital?: Better Any questions or concerns?: Yes Patient Questions/Concerns:: she would like a tub seat Patient Questions/Concerns Addressed: Notified Provider of Patient Questions/Concerns  Items Reviewed: Did you receive and understand the discharge instructions provided?: Yes Medications obtained,verified, and reconciled?: Partial Review Completed Reason for Partial Mediation Review: She said she has all of her medications and did not need to review the med list because the home health PT reviewed them in detail with her yesterday. Any new allergies since your discharge?: No Dietary orders reviewed?: Yes Type of Diet Ordered:: heart healthy, low sodium Do you have support at home?: Yes People in Home: child(ren), adult Name of Support/Comfort Primary Source: her daughter  Medications Reviewed Today: Medications Reviewed Today   Medications were not reviewed in this encounter     Home Care and Equipment/Supplies: Were Home Health Services Ordered?: Yes Name of Home Health Agency:: [facility] Has Agency set up a time to come to your home?: Yes First Home Health Visit Date: 02/12/24 Any new equipment or medical supplies ordered?: Yes Name of Medical supply agency?: [facility] Were you able to get the equipment/medical supplies?:  Yes Do you have any questions related to the use of the equipment/supplies?: No  Functional Questionnaire: Do you need assistance with bathing/showering or dressing?: Yes (her daughter can assist as needed) Do you need assistance with meal preparation?: Yes (her daughter prepares meals) Do you need assistance with eating?: No Do you have difficulty maintaining continence: No Do you need assistance with getting out of bed/getting out of a chair/moving?: Yes (She has a RW to use with ambulating) Do you have difficulty managing or taking your medications?: Yes (her daughter assists)  Follow up appointments reviewed: PCP Follow-up appointment confirmed?: Yes Date of PCP follow-up appointment?: 02/18/24 Follow-up Provider: Dr Laural Benes - video visit Specialist Hospital Follow-up appointment confirmed?: Yes Date of Specialist follow-up appointment?: 03/06/24 Follow-Up Specialty Provider:: neurology Do you need transportation to your follow-up appointment?: No Do you understand care options if your condition(s) worsen?: Yes-patient verbalized understanding .   SIGNATURE Robyne Peers, RN

## 2024-02-13 NOTE — Transitions of Care (Post Inpatient/ED Visit) (Signed)
   02/13/2024  Name: Tammy [name] MRN: [id number] DOB: 06/20/1966  Today's TOC FU Call Status: Today's TOC FU Call Status:: Unsuccessful Call (3rd Attempt) Unsuccessful Call (1st Attempt) Date: 02/11/24 Unsuccessful Call (2nd Attempt) Date: 02/12/24 Unsuccessful Call (3rd Attempt) Date: 02/13/24  Attempted to reach the patient regarding the most recent Inpatient/ED visit.  Follow Up Plan: No further outreach attempts will be made at this time. We have been unable to contact the patient.  Letter sent to patient requesting she contact [facility] to schedule an appointment as we have not been able to reach her.   Signature Robyne Peers, RN

## 2024-02-13 NOTE — Telephone Encounter (Signed)
 Call returned to patient. Information documented in another telephone encounter from today

## 2024-02-18 ENCOUNTER — Ambulatory Visit: Attending: Internal Medicine | Admitting: Internal Medicine

## 2024-02-18 ENCOUNTER — Encounter: Payer: Self-pay | Admitting: Internal Medicine

## 2024-02-18 ENCOUNTER — Telehealth: Payer: Self-pay

## 2024-02-18 DIAGNOSIS — R269 Unspecified abnormalities of gait and mobility: Secondary | ICD-10-CM

## 2024-02-18 DIAGNOSIS — I10 Essential (primary) hypertension: Secondary | ICD-10-CM

## 2024-02-18 DIAGNOSIS — Z09 Encounter for follow-up examination after completed treatment for conditions other than malignant neoplasm: Secondary | ICD-10-CM

## 2024-02-18 DIAGNOSIS — G40909 Epilepsy, unspecified, not intractable, without status epilepticus: Secondary | ICD-10-CM | POA: Diagnosis not present

## 2024-02-18 NOTE — Progress Notes (Signed)
 Patient ID: Tammy [name], female   DOB: 06/16/1966, 58 y.o.   MRN: [id number] Virtual Visit via Video Note  I connected with Tammy [name] on 02/18/2024 at 8:11 AM by a video enabled telemedicine application and verified that I am speaking with the correct person using two identifiers.  Location: Patient: home Provider: Office   I discussed the limitations of evaluation and management by telemedicine and the availability of in person appointments. The patient expressed understanding and agreed to proceed. 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  History of Present Illness   Patient was hospitalized 3/7-10/2024 with status epilepticus.  She had EEG done for several days while in hospital which showed additional nonclinical seizures which ultimately spaced out and then stopped on 02/04/2024.  Brain MRI showed nonspecific white matter changes that may be seen in the setting of migraine headaches or early chronic small vessel ischemia patient was subsequently discharged with Topamax 200 mg twice a day, Keppra 1500 mg twice a day, Depakote 500 mg twice a day, clonazepam 0.5 mg twice a day and blood pressure medication Cozaar 100 mg daily.  Today: The patient, recently discharged from the hospital, is in need of home care item and home physical therapy.  She was discharged home with a standard walker which she has been using.  She feels a little off balance with walking stating that "I tilted a bit."  She expresses concern about falling, particularly in the shower, and requests a shower  chair for safety.  Home health from [facility] came out to her house already and recommended home physical therapy sessions.  The patient's medication regimen has been adjusted from hospital discharge, with an increase in Topamax dosage from 100mg  twice a day to 200mg  twice a day. She has also been prescribed Keppra (750mg , two tablets in the morning and evening) and clonazepam (0.5mg  twice a day). The patient reports that many of her medications cause dizziness and excessive sleepiness. She has not had any seizures or falls since her hospital discharge.  The patient does not drive and is aware of the requirement to be seizure-free for six months to a year before she can drive.   She has been prescribed Cozaar (100mg ) for blood pressure management and reports consistent use. She does not have a blood pressure machine at home and does not consume salt.      Outpatient Encounter Medications as of 02/18/2024  Medication Sig   acetaminophen (TYLENOL) 500 MG tablet Take 1 tablet (500 mg total) by mouth every 8 (eight) hours as needed.   albuterol (VENTOLIN HFA) 108 (90 Base) MCG/ACT inhaler Inhale 2 puffs into the lungs every 6 (six) hours as needed for wheezing or shortness of breath.   clonazePAM (KLONOPIN) 0.5 MG tablet Take 1 tablet (0.5 mg total) by mouth 2 (two) times daily.   diazePAM, 15 MG Dose, (VALTOCO 15 MG DOSE) 2 x 7.5 MG/0.1ML LQPK Place 15 mg into the nose as needed (seizure lasting over 2 minutes).   divalproex (DEPAKOTE) 500 MG DR tablet Take 1 tablet (500 mg total) by mouth every 12 (twelve) hours.   levETIRAcetam (KEPPRA) 750 MG  tablet Take 2 tablets (1,500 mg total) by mouth 2 (two) times daily.   losartan (COZAAR) 100 MG tablet Take 1 tablet (100 mg total) by mouth daily.   Multiple Vitamins-Minerals (MULTI-VITAMIN GUMMIES PO) Take 1 tablet by mouth daily.   topiramate (TOPAMAX) 200 MG tablet Take 1 tablet (200 mg total) by mouth 2 (two) times daily.   [DISCONTINUED]  lisinopril-hydrochlorothiazide (ZESTORETIC) 10-12.5 MG tablet Take 2 tablets by mouth daily. To lower blood pressure (Patient not taking: Reported on 12/05/2020)   No facility-administered encounter medications on file as of 02/18/2024.       Observations/Objective: Middle-age African-American female in NAD  Assessment and Plan: 1. Hospital discharge follow-up (Primary)  2. Seizure disorder (HCC) Stable.  She will continue current antiseizure medications listed above.  She has an appointment coming up with her neurologist Dr. Karel Jarvis on/08/2024 - Ambulatory referral to Home Health  3. Gait disturbance Referral submitted for home health physical therapy and for shower chair.  Prescription for shower chair will be sent to [facility] - Shower chair - Ambulatory referral to Home Health  4. Essential hypertension Continue Cozaar 100 mg daily.     Follow Up Instructions: Keep appointment in May for physical.   I discussed the assessment and treatment plan with the patient. The patient was provided an opportunity to ask questions and all were answered. The patient agreed with the plan and demonstrated an understanding of the instructions.   The patient was advised to call back or seek an in-person evaluation if the symptoms worsen or if the condition fails to improve as anticipated.  I spent 20 minutes dedicated to the care of this patient on the date of this encounter to include previsit review of of chart, face-to-face time with patient discussing diagnosis and management and post visit entering of orders  This note has been created with Education officer, environmental. Any transcriptional errors are unintentional.  Jonah Blue, MD

## 2024-02-18 NOTE — Telephone Encounter (Signed)
 Call to [facility] at [facility]  Service Requested: Physical Therapy Frequency: twice a week for 1 week. One time per week for 3 weeks.  Verbal order given

## 2024-02-19 DIAGNOSIS — G40901 Epilepsy, unspecified, not intractable, with status epilepticus: Secondary | ICD-10-CM | POA: Diagnosis not present

## 2024-02-19 DIAGNOSIS — G9340 Encephalopathy, unspecified: Secondary | ICD-10-CM | POA: Diagnosis not present

## 2024-02-19 DIAGNOSIS — Z9181 History of falling: Secondary | ICD-10-CM | POA: Diagnosis not present

## 2024-02-19 DIAGNOSIS — Z86718 Personal history of other venous thrombosis and embolism: Secondary | ICD-10-CM | POA: Diagnosis not present

## 2024-02-19 DIAGNOSIS — J45909 Unspecified asthma, uncomplicated: Secondary | ICD-10-CM | POA: Diagnosis not present

## 2024-02-19 DIAGNOSIS — I10 Essential (primary) hypertension: Secondary | ICD-10-CM | POA: Diagnosis not present

## 2024-02-19 DIAGNOSIS — G43909 Migraine, unspecified, not intractable, without status migrainosus: Secondary | ICD-10-CM | POA: Diagnosis not present

## 2024-02-19 DIAGNOSIS — Z8616 Personal history of COVID-19: Secondary | ICD-10-CM | POA: Diagnosis not present

## 2024-02-21 DIAGNOSIS — G9340 Encephalopathy, unspecified: Secondary | ICD-10-CM | POA: Diagnosis not present

## 2024-02-21 DIAGNOSIS — Z86718 Personal history of other venous thrombosis and embolism: Secondary | ICD-10-CM | POA: Diagnosis not present

## 2024-02-21 DIAGNOSIS — Z8616 Personal history of COVID-19: Secondary | ICD-10-CM | POA: Diagnosis not present

## 2024-02-21 DIAGNOSIS — G43909 Migraine, unspecified, not intractable, without status migrainosus: Secondary | ICD-10-CM | POA: Diagnosis not present

## 2024-02-21 DIAGNOSIS — J45909 Unspecified asthma, uncomplicated: Secondary | ICD-10-CM | POA: Diagnosis not present

## 2024-02-21 DIAGNOSIS — I10 Essential (primary) hypertension: Secondary | ICD-10-CM | POA: Diagnosis not present

## 2024-02-21 DIAGNOSIS — Z9181 History of falling: Secondary | ICD-10-CM | POA: Diagnosis not present

## 2024-02-21 DIAGNOSIS — G40901 Epilepsy, unspecified, not intractable, with status epilepticus: Secondary | ICD-10-CM | POA: Diagnosis not present

## 2024-02-27 ENCOUNTER — Other Ambulatory Visit (HOSPITAL_COMMUNITY): Payer: Self-pay

## 2024-02-27 ENCOUNTER — Telehealth: Payer: Self-pay | Admitting: Internal Medicine

## 2024-02-27 DIAGNOSIS — G9340 Encephalopathy, unspecified: Secondary | ICD-10-CM | POA: Diagnosis not present

## 2024-02-27 DIAGNOSIS — J45909 Unspecified asthma, uncomplicated: Secondary | ICD-10-CM | POA: Diagnosis not present

## 2024-02-27 DIAGNOSIS — Z8616 Personal history of COVID-19: Secondary | ICD-10-CM | POA: Diagnosis not present

## 2024-02-27 DIAGNOSIS — G43909 Migraine, unspecified, not intractable, without status migrainosus: Secondary | ICD-10-CM | POA: Diagnosis not present

## 2024-02-27 DIAGNOSIS — Z9181 History of falling: Secondary | ICD-10-CM | POA: Diagnosis not present

## 2024-02-27 DIAGNOSIS — G40901 Epilepsy, unspecified, not intractable, with status epilepticus: Secondary | ICD-10-CM | POA: Diagnosis not present

## 2024-02-27 DIAGNOSIS — Z86718 Personal history of other venous thrombosis and embolism: Secondary | ICD-10-CM | POA: Diagnosis not present

## 2024-02-27 DIAGNOSIS — I10 Essential (primary) hypertension: Secondary | ICD-10-CM | POA: Diagnosis not present

## 2024-02-27 NOTE — Telephone Encounter (Signed)
 Patient called and she says she had questions on how to administer the diltiazem, but the home health nurse came and instructed her. She says the clonazepam she takes BID, but it only had 30 pills/0 refills. She's wondering if Dr. Laural Benes will send in refills or does she need to see someone else. Advised I will send this to Dr. Laural Benes and someone will call back with the recommendation.   Preferred pharmacy: [facility] Phone: [phone]  Fax: [phone]      Copied from CRM [id number]. Topic: Clinical - Medication Question >> Feb 27, 2024  2:43 PM Dondra Prader E wrote: Reason for CRM: Pt called with a Home Health Nurse with questions about her Clonazepam. Please advise   Best contact: [phone] >> Feb 27, 2024  2:48 PM Dondra Prader E wrote: Pt also has questions on how to use diazePAM, 15 MG Dose, (VALTOCO 15 MG DOSE) 2 x 7.5 MG/0.1ML LQPK

## 2024-02-27 NOTE — Telephone Encounter (Signed)
 This patient is under the care of Dr. Karel Jarvis from [facility] neurology and this was prescribed for her seizures.  Please advise her to contact Dr. Karel Jarvis.

## 2024-02-27 NOTE — Telephone Encounter (Addendum)
 Spoke with Pharmacist, at [facility] regarding quantity that Clonazepam was filled for. States b/c it was a controlled substance, the minimum (#30) was filled opposed to the max (#60). Patient would need an OV to get a refill. Unable to reach Dr. Mliss Sax regarding this since she had been out of the system more that 48 hours.     Patient states she is about to run out and there are not refills left on this prescription.  Initial Rx sent sent in by Dr. Mliss Sax, EDP.  Please advise if OV/ VV is needed for refill.

## 2024-02-27 NOTE — Telephone Encounter (Signed)
 Patient instructed to f/u with Neurology for refill or appt.

## 2024-02-28 DIAGNOSIS — G40901 Epilepsy, unspecified, not intractable, with status epilepticus: Secondary | ICD-10-CM | POA: Diagnosis not present

## 2024-02-28 DIAGNOSIS — J45909 Unspecified asthma, uncomplicated: Secondary | ICD-10-CM | POA: Diagnosis not present

## 2024-02-28 DIAGNOSIS — Z9181 History of falling: Secondary | ICD-10-CM | POA: Diagnosis not present

## 2024-02-28 DIAGNOSIS — Z8616 Personal history of COVID-19: Secondary | ICD-10-CM | POA: Diagnosis not present

## 2024-02-28 DIAGNOSIS — G43909 Migraine, unspecified, not intractable, without status migrainosus: Secondary | ICD-10-CM | POA: Diagnosis not present

## 2024-02-28 DIAGNOSIS — G9340 Encephalopathy, unspecified: Secondary | ICD-10-CM | POA: Diagnosis not present

## 2024-02-28 DIAGNOSIS — I10 Essential (primary) hypertension: Secondary | ICD-10-CM | POA: Diagnosis not present

## 2024-02-28 DIAGNOSIS — Z86718 Personal history of other venous thrombosis and embolism: Secondary | ICD-10-CM | POA: Diagnosis not present

## 2024-02-29 ENCOUNTER — Telehealth: Payer: Self-pay | Admitting: Internal Medicine

## 2024-02-29 NOTE — Telephone Encounter (Signed)
 Called & spoke to the [place], Charity fundraiser at [facility]. Verbal orders given.

## 2024-02-29 NOTE — Telephone Encounter (Signed)
 Copied from CRM [id number]. Topic: Clinical - Home Health Verbal Orders >> Feb 12, 2024  4:44 PM Fuller Mandril wrote: Caller/Agency: Will with  [facility]  Callback Number: [phone] Service Requested: Occupational Therapy Frequency: 1 time per week for 4 weeks  Any new concerns about the patient? No >> Feb 29, 2024 12:18 PM Ivette P wrote: Tobi Bastos called in from [facility] to Follow up on this request. States she can take a verbal approval. Requesting a callback [phone]

## 2024-03-04 DIAGNOSIS — G40901 Epilepsy, unspecified, not intractable, with status epilepticus: Secondary | ICD-10-CM | POA: Diagnosis not present

## 2024-03-04 DIAGNOSIS — G43909 Migraine, unspecified, not intractable, without status migrainosus: Secondary | ICD-10-CM | POA: Diagnosis not present

## 2024-03-04 DIAGNOSIS — Z8616 Personal history of COVID-19: Secondary | ICD-10-CM | POA: Diagnosis not present

## 2024-03-04 DIAGNOSIS — J45909 Unspecified asthma, uncomplicated: Secondary | ICD-10-CM | POA: Diagnosis not present

## 2024-03-04 DIAGNOSIS — G9340 Encephalopathy, unspecified: Secondary | ICD-10-CM | POA: Diagnosis not present

## 2024-03-04 DIAGNOSIS — Z86718 Personal history of other venous thrombosis and embolism: Secondary | ICD-10-CM | POA: Diagnosis not present

## 2024-03-04 DIAGNOSIS — Z9181 History of falling: Secondary | ICD-10-CM | POA: Diagnosis not present

## 2024-03-04 DIAGNOSIS — I10 Essential (primary) hypertension: Secondary | ICD-10-CM | POA: Diagnosis not present

## 2024-03-05 DIAGNOSIS — Z8616 Personal history of COVID-19: Secondary | ICD-10-CM | POA: Diagnosis not present

## 2024-03-05 DIAGNOSIS — Z86718 Personal history of other venous thrombosis and embolism: Secondary | ICD-10-CM | POA: Diagnosis not present

## 2024-03-05 DIAGNOSIS — Z9181 History of falling: Secondary | ICD-10-CM | POA: Diagnosis not present

## 2024-03-05 DIAGNOSIS — G9340 Encephalopathy, unspecified: Secondary | ICD-10-CM | POA: Diagnosis not present

## 2024-03-05 DIAGNOSIS — G43909 Migraine, unspecified, not intractable, without status migrainosus: Secondary | ICD-10-CM | POA: Diagnosis not present

## 2024-03-05 DIAGNOSIS — G40901 Epilepsy, unspecified, not intractable, with status epilepticus: Secondary | ICD-10-CM | POA: Diagnosis not present

## 2024-03-05 DIAGNOSIS — I10 Essential (primary) hypertension: Secondary | ICD-10-CM | POA: Diagnosis not present

## 2024-03-05 DIAGNOSIS — J45909 Unspecified asthma, uncomplicated: Secondary | ICD-10-CM | POA: Diagnosis not present

## 2024-03-06 ENCOUNTER — Ambulatory Visit: Payer: MEDICAID | Admitting: Neurology

## 2024-03-10 ENCOUNTER — Ambulatory Visit: Admitting: Neurology

## 2024-03-10 ENCOUNTER — Encounter: Payer: Self-pay | Admitting: Neurology

## 2024-03-10 DIAGNOSIS — G43909 Migraine, unspecified, not intractable, without status migrainosus: Secondary | ICD-10-CM | POA: Diagnosis not present

## 2024-03-10 DIAGNOSIS — I10 Essential (primary) hypertension: Secondary | ICD-10-CM | POA: Diagnosis not present

## 2024-03-10 DIAGNOSIS — Z86718 Personal history of other venous thrombosis and embolism: Secondary | ICD-10-CM | POA: Diagnosis not present

## 2024-03-10 DIAGNOSIS — G9340 Encephalopathy, unspecified: Secondary | ICD-10-CM | POA: Diagnosis not present

## 2024-03-10 DIAGNOSIS — G40901 Epilepsy, unspecified, not intractable, with status epilepticus: Secondary | ICD-10-CM | POA: Diagnosis not present

## 2024-03-10 DIAGNOSIS — Z8616 Personal history of COVID-19: Secondary | ICD-10-CM | POA: Diagnosis not present

## 2024-03-10 DIAGNOSIS — J45909 Unspecified asthma, uncomplicated: Secondary | ICD-10-CM | POA: Diagnosis not present

## 2024-03-10 DIAGNOSIS — Z9181 History of falling: Secondary | ICD-10-CM | POA: Diagnosis not present

## 2024-03-11 ENCOUNTER — Other Ambulatory Visit: Payer: Self-pay

## 2024-03-11 ENCOUNTER — Emergency Department (HOSPITAL_COMMUNITY)
Admission: EM | Admit: 2024-03-11 | Discharge: 2024-03-11 | Disposition: A | Discharge status: Home / Self Care | Attending: Emergency Medicine | Admitting: Emergency Medicine

## 2024-03-11 ENCOUNTER — Encounter (HOSPITAL_COMMUNITY): Payer: Self-pay

## 2024-03-11 ENCOUNTER — Emergency Department (HOSPITAL_COMMUNITY)

## 2024-03-11 DIAGNOSIS — M545 Low back pain, unspecified: Secondary | ICD-10-CM | POA: Insufficient documentation

## 2024-03-11 DIAGNOSIS — M549 Dorsalgia, unspecified: Secondary | ICD-10-CM | POA: Diagnosis not present

## 2024-03-11 DIAGNOSIS — J45909 Unspecified asthma, uncomplicated: Secondary | ICD-10-CM | POA: Diagnosis not present

## 2024-03-11 DIAGNOSIS — I10 Essential (primary) hypertension: Secondary | ICD-10-CM | POA: Diagnosis not present

## 2024-03-11 DIAGNOSIS — Z79899 Other long term (current) drug therapy: Secondary | ICD-10-CM | POA: Diagnosis not present

## 2024-03-11 LAB — URINALYSIS, ROUTINE W REFLEX MICROSCOPIC
Bilirubin Urine: NEGATIVE
Glucose, UA: NEGATIVE mg/dL
Hgb urine dipstick: NEGATIVE
Ketones, ur: NEGATIVE mg/dL
Leukocytes,Ua: NEGATIVE
Nitrite: NEGATIVE
Protein, ur: NEGATIVE mg/dL
Specific Gravity, Urine: 1.003 — ABNORMAL LOW (ref 1.005–1.030)
pH: 8 (ref 5.0–8.0)

## 2024-03-11 MED ORDER — LIDOCAINE 5 % EX PTCH
1.0000 | MEDICATED_PATCH | CUTANEOUS | Status: DC
  Administered 2024-03-11: 1 via TRANSDERMAL
  Filled 2024-03-11: qty 1

## 2024-03-11 MED ORDER — PREDNISONE 10 MG PO TABS
40.0000 mg | ORAL_TABLET | Freq: Every day | ORAL | 0 refills | Status: AC
  Filled 2024-03-11: qty 16, 4d supply, fill #0

## 2024-03-11 MED ORDER — PREDNISONE 20 MG PO TABS
40.0000 mg | ORAL_TABLET | Freq: Once | ORAL | Status: AC
  Administered 2024-03-11: 40 mg via ORAL
  Filled 2024-03-11: qty 2

## 2024-03-11 MED ORDER — LIDOCAINE 5 % EX PTCH
1.0000 | MEDICATED_PATCH | CUTANEOUS | 0 refills | Status: AC
  Filled 2024-03-11: qty 30, 30d supply, fill #0

## 2024-03-11 MED ORDER — CYCLOBENZAPRINE HCL 10 MG PO TABS
10.0000 mg | ORAL_TABLET | Freq: Two times a day (BID) | ORAL | 0 refills | Status: DC | PRN
  Filled 2024-03-11: qty 20, 10d supply, fill #0

## 2024-03-11 MED ORDER — ACETAMINOPHEN 500 MG PO TABS
1000.0000 mg | ORAL_TABLET | Freq: Once | ORAL | Status: AC
  Administered 2024-03-11: 1000 mg via ORAL
  Filled 2024-03-11: qty 2

## 2024-03-11 NOTE — Discharge Instructions (Addendum)
 Evaluation today revealed that you likely have muscular lower back pain.  Please follow with your PCP.  I sent a 5-day course of steroids, Lidoderm patches and Flexeril which is a muscle relaxant to your pharmacy for treatment in the meantime.

## 2024-03-11 NOTE — ED Provider Triage Note (Signed)
 Emergency Medicine Provider Triage Evaluation Note  Tammy [name] , a 58 y.o. female  was evaluated in triage.  Pt complains of back pain. Pain began last night when she sat up in the night to go to the bathroom. Initially was mild, however when she got up again this morning it was worse. Denies loss of bowel or bladder function or saddle paraesthesias. No urinary symptoms. No history of similar pain previously. No recent trauma or overuse. No history of malignancy or steroid use. Able to walk but with pain. Pain is low back and into left side. Some radiation into the left leg. Pain only present when sitting up, relieved with sitting down.  Review of Systems  Positive:  Negative:   Physical Exam  BP (!) 167/110 (BP Location: Right Arm)   Pulse 63   Temp 97.7 F (36.5 C)   Resp 16   LMP 03/08/2016   SpO2 98%  Gen:   Awake, no distress   Resp:  Normal effort  MSK:   Moves extremities without difficulty  Other:  TTP low lumbar spine and left side. Ambulatory with a cane.  Medical Decision Making  Medically screening exam initiated at 12:42 PM.  Appropriate orders placed.  CLAYTON JARMON was informed that the remainder of the evaluation will be completed by another provider, this initial triage assessment does not replace that evaluation, and the importance of remaining in the ED until their evaluation is complete.  Work-up initiated   Zayna Toste A, PA-C 03/11/24 1246

## 2024-03-11 NOTE — ED Triage Notes (Signed)
 Lower Back pain started late last night Denies painful urination or blood in urine

## 2024-03-11 NOTE — ED Provider Notes (Signed)
 [place] EMERGENCY DEPARTMENT AT [facility] Provider Note   CSN: [id number] Arrival date & time: 03/11/24  1029     History  Chief Complaint  Patient presents with   Back Pain   HPI Tammy [name] is a 58 y.o. female with seizures, hypertension and asthma, chronic migraines presenting for back pain.  Started last night while she was sleeping.  She states the pain is all along the lower back but worse on the left side.  Denies any urinary or bowel changes.  Denies any saddle anesthesia or fever.  Denies trauma to the back.  Pain is worse with movement.  But she will ambulatory and able to bear weight.  Has tried tylenol.    Back Pain      Home Medications Prior to Admission medications   Medication Sig Start Date End Date Taking? Authorizing Provider  cyclobenzaprine (FLEXERIL) 10 MG tablet Take 1 tablet (10 mg total) by mouth 2 (two) times daily as needed for muscle spasms. 03/11/24  Yes Mozel Burdett K, PA-C  lidocaine (LIDODERM) 5 % Place 1 patch onto the skin daily. Remove & Discard patch within 12 hours or as directed by MD 03/11/24  Yes Camey Edell K, PA-C  predniSONE (DELTASONE) 10 MG tablet Take 4 tablets (40 mg total) by mouth daily for 4 days. 03/11/24 03/15/24 Yes Jonathon Castelo K, PA-C  acetaminophen (TYLENOL) 500 MG tablet Take 1 tablet (500 mg total) by mouth every 8 (eight) hours as needed. 04/09/23   Lawrance Presume, MD  albuterol (VENTOLIN HFA) 108 (90 Base) MCG/ACT inhaler Inhale 2 puffs into the lungs every 6 (six) hours as needed for wheezing or shortness of breath. 04/09/23   Lawrance Presume, MD  clonazePAM (KLONOPIN) 0.5 MG tablet Take 1 tablet (0.5 mg total) by mouth 2 (two) times daily. 02/06/24   Omar Bibber, DO  diazePAM, 15 MG Dose, (VALTOCO 15 MG DOSE) 2 x 7.5 MG/0.1ML LQPK Place 15 mg into the nose as needed (seizure lasting over 2 minutes). 02/06/24   Omar Bibber, DO  divalproex (DEPAKOTE) 500 MG DR tablet Take 1 tablet (500 mg total)  by mouth every 12 (twelve) hours. 02/06/24   Omar Bibber, DO  levETIRAcetam (KEPPRA) 750 MG tablet Take 2 tablets (1,500 mg total) by mouth 2 (two) times daily. 02/06/24   Omar Bibber, DO  losartan (COZAAR) 100 MG tablet Take 1 tablet (100 mg total) by mouth daily. 09/07/23   Lawrance Presume, MD  Multiple Vitamins-Minerals (MULTI-VITAMIN GUMMIES PO) Take 1 tablet by mouth daily.    [provider]  topiramate (TOPAMAX) 200 MG tablet Take 1 tablet (200 mg total) by mouth 2 (two) times daily. 12/12/23   Jhonny Moss, MD  lisinopril-hydrochlorothiazide (ZESTORETIC) 10-12.5 MG tablet Take 2 tablets by mouth daily. To lower blood pressure Patient not taking: Reported on 12/05/2020 08/12/20 12/08/20  Senaida Dama, NP      Allergies    Dilantin [phenytoin], Aspirin, Pork-derived products, and Tramadol    Review of Systems   Review of Systems  Musculoskeletal:  Positive for back pain.    Physical Exam Updated Vital Signs BP (!) 167/110 (BP Location: Right Arm)   Pulse 63   Temp 97.7 F (36.5 C)   Resp 16   LMP 03/08/2016   SpO2 98%  Physical Exam Constitutional:      Appearance: Normal appearance.  HENT:     Head: Normocephalic.     Nose: Nose normal.  Eyes:  Conjunctiva/sclera: Conjunctivae normal.  Pulmonary:     Effort: Pulmonary effort is normal.  Abdominal:     General: There is no distension.     Palpations: Abdomen is soft.     Tenderness: There is no abdominal tenderness. There is no right CVA tenderness or left CVA tenderness.  Musculoskeletal:     Lumbar back: Tenderness present.     Comments: Generalized to the lower back.  Range of motion is intact.  No evidence of trauma.  No pain with active movement of the back  Neurological:     Mental Status: She is alert.  Psychiatric:        Mood and Affect: Mood normal.     ED Results / Procedures / Treatments   Labs (all labs ordered are listed, but only abnormal results are displayed) Labs Reviewed   URINALYSIS, ROUTINE W REFLEX MICROSCOPIC - Abnormal; Notable for the following components:      Result Value   Color, Urine STRAW (*)    Specific Gravity, Urine 1.003 (*)    All other components within normal limits    EKG None  Radiology No results found.  Procedures Procedures    Medications Ordered in ED Medications  lidocaine (LIDODERM) 5 % 1 patch (has no administration in time range)  acetaminophen (TYLENOL) tablet 1,000 mg (has no administration in time range)  predniSONE (DELTASONE) tablet 40 mg (has no administration in time range)    ED Course/ Medical Decision Making/ A&P                                 Medical Decision Making  58 yo well appearing female presenting for lower back pain. Exam notable for generalized lower back tenderness.  DDx includes fracture, dislocation of the spine, cauda equina syndrome, renal pathology, appendicitis, other.  Doubt traumatic injury or cauda equina given lack of red flag symptoms.  Denied renal pathology and appendicitis given no flank pain or abdominal pain and lack of symptoms. Suspect muscular injury. Patient refused NSAIDs. Sent 5 days course of steroids. Also sent Flexeril to pharmacy at her request.  Advised her to follow-up with her PCP.        Final Clinical Impression(s) / ED Diagnoses Final diagnoses:  Low back pain, unspecified back pain laterality, unspecified chronicity, unspecified whether sciatica present    Rx / DC Orders ED Discharge Orders          Ordered    predniSONE (DELTASONE) 10 MG tablet  Daily        03/11/24 1412    lidocaine (LIDODERM) 5 %  Every 24 hours        03/11/24 1413    cyclobenzaprine (FLEXERIL) 10 MG tablet  2 times daily PRN        03/11/24 1413              Janalee Mcmurray, PA-C 03/11/24 1414    Albertus Hughs, DO 03/11/24 1418

## 2024-03-12 DIAGNOSIS — G43909 Migraine, unspecified, not intractable, without status migrainosus: Secondary | ICD-10-CM | POA: Diagnosis not present

## 2024-03-12 DIAGNOSIS — Z9181 History of falling: Secondary | ICD-10-CM | POA: Diagnosis not present

## 2024-03-12 DIAGNOSIS — G40901 Epilepsy, unspecified, not intractable, with status epilepticus: Secondary | ICD-10-CM | POA: Diagnosis not present

## 2024-03-12 DIAGNOSIS — I10 Essential (primary) hypertension: Secondary | ICD-10-CM | POA: Diagnosis not present

## 2024-03-12 DIAGNOSIS — Z86718 Personal history of other venous thrombosis and embolism: Secondary | ICD-10-CM | POA: Diagnosis not present

## 2024-03-12 DIAGNOSIS — Z8616 Personal history of COVID-19: Secondary | ICD-10-CM | POA: Diagnosis not present

## 2024-03-12 DIAGNOSIS — J45909 Unspecified asthma, uncomplicated: Secondary | ICD-10-CM | POA: Diagnosis not present

## 2024-03-12 DIAGNOSIS — G9340 Encephalopathy, unspecified: Secondary | ICD-10-CM | POA: Diagnosis not present

## 2024-03-28 ENCOUNTER — Encounter: Payer: Self-pay | Admitting: Internal Medicine

## 2024-03-28 ENCOUNTER — Other Ambulatory Visit: Payer: Self-pay

## 2024-03-28 ENCOUNTER — Ambulatory Visit: Attending: Internal Medicine | Admitting: Internal Medicine

## 2024-03-28 VITALS — BP 144/88 | HR 60 | Temp 97.5°F | Ht 61.0 in | Wt 123.0 lb

## 2024-03-28 DIAGNOSIS — I83893 Varicose veins of bilateral lower extremities with other complications: Secondary | ICD-10-CM

## 2024-03-28 DIAGNOSIS — Z1231 Encounter for screening mammogram for malignant neoplasm of breast: Secondary | ICD-10-CM

## 2024-03-28 DIAGNOSIS — H547 Unspecified visual loss: Secondary | ICD-10-CM

## 2024-03-28 DIAGNOSIS — I10 Essential (primary) hypertension: Secondary | ICD-10-CM | POA: Diagnosis not present

## 2024-03-28 DIAGNOSIS — G40909 Epilepsy, unspecified, not intractable, without status epilepticus: Secondary | ICD-10-CM

## 2024-03-28 DIAGNOSIS — I83899 Varicose veins of unspecified lower extremities with other complications: Secondary | ICD-10-CM

## 2024-03-28 DIAGNOSIS — Z0000 Encounter for general adult medical examination without abnormal findings: Secondary | ICD-10-CM

## 2024-03-28 DIAGNOSIS — E049 Nontoxic goiter, unspecified: Secondary | ICD-10-CM | POA: Diagnosis not present

## 2024-03-28 DIAGNOSIS — K029 Dental caries, unspecified: Secondary | ICD-10-CM

## 2024-03-28 DIAGNOSIS — Z1159 Encounter for screening for other viral diseases: Secondary | ICD-10-CM

## 2024-03-28 DIAGNOSIS — Z2821 Immunization not carried out because of patient refusal: Secondary | ICD-10-CM

## 2024-03-28 DIAGNOSIS — R634 Abnormal weight loss: Secondary | ICD-10-CM

## 2024-03-28 MED ORDER — LOSARTAN POTASSIUM 100 MG PO TABS
100.0000 mg | ORAL_TABLET | Freq: Every day | ORAL | 1 refills | Status: DC
  Filled 2024-03-28: qty 90, 90d supply, fill #0

## 2024-03-28 MED ORDER — LEVETIRACETAM 750 MG PO TABS
1500.0000 mg | ORAL_TABLET | Freq: Two times a day (BID) | ORAL | 4 refills | Status: DC
  Filled 2024-03-28: qty 120, 30d supply, fill #0
  Filled 2024-04-25: qty 120, 30d supply, fill #1

## 2024-03-28 MED ORDER — AMLODIPINE BESYLATE 5 MG PO TABS
5.0000 mg | ORAL_TABLET | Freq: Every day | ORAL | 1 refills | Status: DC
Start: 2024-03-28 — End: 2024-09-11
  Filled 2024-03-28: qty 90, 90d supply, fill #0

## 2024-03-28 NOTE — Patient Instructions (Signed)
 VISIT SUMMARY:  Today, you had your annual physical exam. We discussed your hypertension, seizure disorder, thyroid  nodule, varicose veins, and dental issues. We also reviewed your general health maintenance and made recommendations for routine screenings and lifestyle modifications.  YOUR PLAN:  -HYPERTENSION: Hypertension means high blood pressure. Your blood pressure is elevated despite taking losartan . We are adding amlodipine  5 mg once daily to help control it better. Please get a home blood pressure monitor to keep track of your readings. Continue to limit your salt intake and consider alternative seasonings.  -SEIZURE DISORDER: A seizure disorder involves recurrent seizures. Your medication was recently changed to valproic acid , which is causing headaches and elevated blood pressure. Please contact your neurologist to discuss these side effects. Continue taking Topamax  and Keppra  as prescribed and do not stop any medication abruptly.  -THYROID  NODULE: A thyroid  nodule is a growth in the thyroid  gland. You have a large nodule that was previously biopsied. We will order an updated thyroid  ultrasound and check your thyroid  levels to monitor it.  -VARICOSE VEINS: Varicose veins are swollen, twisted veins that can cause swelling, especially with prolonged standing or walking. We recommend wearing compression socks below the knee to help manage the swelling.  -DENTAL ISSUES: You have several rotted and broken teeth in your upper jaw. It is important to see a dentist for a routine cleaning and evaluation of these teeth.  -GENERAL HEALTH MAINTENANCE: We conducted your routine wellness visit. You declined the pneumonia and shingles vaccines but agreed to hepatitis C screening and a cholesterol level check. We will also order an updated mammogram and refer you for an eye exam.  INSTRUCTIONS:  Please follow up with your neurologist to discuss the side effects of valproic acid . Get a home blood  pressure monitor and start taking amlodipine  5 mg once daily. Schedule an appointment with a dentist for a routine cleaning and evaluation of your teeth. We will order an updated thyroid  ultrasound and check your thyroid  levels. Additionally, we will screen you for hepatitis C, check your cholesterol level, and order an updated mammogram. Please also schedule an eye exam.

## 2024-03-28 NOTE — Progress Notes (Signed)
 Patient ID: Tammy [name], female    DOB: 1965-12-30  MRN: [id number]  CC: Annual Exam (Physical. Med refill. Alois Arnt Valproice Acid - causing headaches & elevation in BP. Pt instructed to stop taking Depakote )   Subjective: [name] is a 58 y.o. female who presents for physical. 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  History of Present Illness   [place] C Tammy [name] is a 58 year old female who presents for an annual physical exam.  HTN: She has hypertension and takes losartan  100 mg daily; took already for today.  She does not monitor her blood pressure at home but plans to obtain a monitor. She limits salt intake and seeks a low-salt alternative to lemon pepper seasoning. There is occasional swelling in her left leg with prolonged standing or walking.  Sz disorder:  She has  seizure disorder and supposed to be on valproic acid  250 mg 2 tabs twice a day, Topamax  200 mg twice a day and Keppra  750 mg 2 tablets twice a day. Her medication was recently changed from Depakote  to valproic acid  by a physician at the [facility], resulting in headaches and elevated blood pressure. She stopped taking it.  She is also followed by neurologist Dr. Ty Gales.  She was hospitalized in February for status epilepticus. She is uncertain about continuing with her current neurologist or the [place] neurologist.  She experiences vision problems both close up and far away and has never had an eye exam. She has several rotted and broken teeth in her upper jaw and has not seen a dentist in a while. She has a large thyroid  nodule that has been biopsied previously with benign  pathology.  Denies any compressive symptoms from the thyroid .  Overdue for repeat thyroid  ultrasound  Her diet is high in seafood and salads, she drinks a lot of water, and is trying to reduce coffee intake. She engages in physical activity by dancing at home and walking to the store. She has experienced some unexplained weight loss.  No palpitations or night sweats. She is on high dose Topamax .     HM: She is agreeable to Prevnar 20.  Declines shingles vaccine.  Agreeable to hepatitis C screening.  Due for lipid profile Patient Active Problem List   Diagnosis Date Noted   Chronic health problem 02/04/2024   Seizures (HCC) 02/01/2024   Encephalopathy 02/01/2024   Varicose veins of leg with edema, bilateral 02/20/2022   COVID-19 virus infection 12/05/2020   Acute superficial venous thrombosis of lower extremity, left 12/13/2018   Hypertension 12/13/2018   Chronic migraine 09/07/2016   Seizure (HCC) 08/20/2016   Asthma 08/20/2016   DVT, HX OF 01/20/2008   FIBROIDS, UTERUS 01/16/2008     Current Outpatient Medications on File Prior to Visit  Medication Sig Dispense Refill   acetaminophen  (TYLENOL ) 500 MG tablet Take 1 tablet (500 mg total) by mouth every 8 (eight) hours as needed. 60 tablet 1   albuterol  (VENTOLIN  HFA) 108 (90 Base) MCG/ACT inhaler Inhale 2 puffs into the lungs every 6 (six) hours as needed for wheezing or shortness of breath. 18 g 2   clonazePAM  (KLONOPIN ) 0.5 MG tablet Take 1 tablet (0.5 mg total)  by mouth 2 (two) times daily. 30 tablet 0   cyclobenzaprine  (FLEXERIL ) 10 MG tablet Take 1 tablet (10 mg total) by mouth 2 (two) times daily as needed for muscle spasms. 20 tablet 0   diazePAM , 15 MG Dose, (VALTOCO  15 MG DOSE) 2 x 7.5 MG/0.1ML LQPK Place 15 mg into the nose as needed (seizure lasting over 2 minutes). 10 each 2   lidocaine  (LIDODERM ) 5 % Place 1 patch onto the skin daily. Remove & Discard patch within 12 hours or as directed by MD 30 patch 0   Multiple  Vitamins-Minerals (MULTI-VITAMIN GUMMIES PO) Take 1 tablet by mouth daily.     topiramate  (TOPAMAX ) 200 MG tablet Take 1 tablet (200 mg total) by mouth 2 (two) times daily. 180 tablet 3   valproic acid  (DEPAKENE ) 250 MG capsule Take 2 capsules by mouth 2 (two) times daily. (Patient not taking: Reported on 03/28/2024)     [DISCONTINUED] lisinopril -hydrochlorothiazide  (ZESTORETIC ) 10-12.5 MG tablet Take 2 tablets by mouth daily. To lower blood pressure (Patient not taking: Reported on 12/05/2020) 60 tablet 2   No current facility-administered medications on file prior to visit.    Allergies  Allergen Reactions   Dilantin  [Phenytoin ] Swelling   Aspirin Other (See Comments)    Patient unsure of reaction    Pork-Derived Products Other (See Comments)   Tramadol Itching and Other (See Comments)    Pt states she ins unsure of reaction Pt states she ins unsure of reaction Pt states she ins unsure of reaction Pt states she ins unsure of reaction    Social History   Socioeconomic History   Marital status: Single    Spouse name: Not on file   Number of children: Not on file   Years of education: Not on file   Highest education level: Not on file  Occupational History   Not on file  Tobacco Use   Smoking status: Never   Smokeless tobacco: Never  Vaping Use   Vaping status: Never Used  Substance and Sexual Activity   Alcohol use: No   Drug use: Yes    Types: Marijuana   Sexual activity: Not on file  Other Topics Concern   Not on file  Social History Narrative   Right handed    Lives with family    Social Drivers of Health   Financial Resource Strain: Not on file  Food Insecurity: Not on file  Transportation Needs: Not on file  Physical Activity: Not on file  Stress: Not on file  Social Connections: Not on file  Intimate Partner Violence: Not on file    Family History  Problem Relation Age of Onset   Heart disease Mother    Migraines Mother    Heart attack Father    Colon  cancer Neg Hx     Past Surgical History:  Procedure Laterality Date   HERNIA REPAIR     UTERINE FIBROID SURGERY      ROS: Review of Systems  HENT:  Negative for congestion, sinus pressure and trouble swallowing.   Respiratory:  Negative for cough and shortness of breath.   Cardiovascular:  Negative for chest pain.  Gastrointestinal:  Negative for abdominal pain and blood in stool.  Genitourinary:  Negative for difficulty urinating.   Negative except as stated above  PHYSICAL EXAM: BP (!) 144/88 (BP Location: Left Arm, Patient Position: Sitting, Cuff Size: Normal)   Pulse 60   Temp (!) 97.5 F (36.4 C) (Oral)   Ht 5\' 1"  (  1.549 m)   Wt 123 lb (55.8 kg)   LMP 03/08/2016   SpO2 100%   BMI 23.24 kg/m   Wt Readings from Last 3 Encounters:  03/28/24 123 lb (55.8 kg)  02/05/24 124 lb 12.5 oz (56.6 kg)  02/01/24 130 lb (59 kg)    Physical Exam  General appearance - alert, well appearing, middle age AAF and in no distress Mental status - normal mood, behavior, speech, dress, motor activity, and thought processes Eyes - pupils equal and reactive, extraocular eye movements intact Ears - bilateral TM's and external ear canals normal Nose - normal and patent, no erythema, discharge or polyps Mouth - mucous membranes moist, pharynx normal without lesions patient has at least 3-4 decayed teeth in the upper jaw that have broken off in the gum.  Throat is without erythema or exudates Neck - supple, no significant adenopathy Lymphatics - no palpable lymphadenopathy, no hepatosplenomegaly Chest - clear to auscultation, no wheezes, rales or rhonchi, symmetric air entry Heart - normal rate, regular rhythm, normal S1, S2, no murmurs, rubs, clicks or gallops Abdomen - soft, nontender, nondistended, no masses or organomegaly Musculoskeletal - no joint tenderness, deformity or swelling Extremities -she has mild varicose veins in both lower legs and dorsal surface of feet left greater than  right. nonpitting edema mild in both lower extremities left greater than right.      Latest Ref Rng & Units 02/04/2024   11:06 AM 02/02/2024   12:27 AM 02/01/2024   12:36 PM  CMP  Glucose 70 - 99 mg/dL 99  811  914   BUN 6 - 20 mg/dL 12  12  14    Creatinine 0.44 - 1.00 mg/dL 7.82  9.56  2.13   Sodium 135 - 145 mmol/L 137  135  139   Potassium 3.5 - 5.1 mmol/L 3.6  4.0  3.4   Chloride 98 - 111 mmol/L 105  105  105   CO2 22 - 32 mmol/L 22  19  15    Calcium 8.9 - 10.3 mg/dL 9.8  9.2  9.7    Lipid Panel     Component Value Date/Time   CHOL 239 (H) 02/20/2022 1555   TRIG 79 02/20/2022 1555   HDL 83 02/20/2022 1555   CHOLHDL 2.9 02/20/2022 1555   LDLCALC 143 (H) 02/20/2022 1555    CBC    Component Value Date/Time   WBC 4.9 02/04/2024 1106   RBC 5.04 02/04/2024 1106   HGB 14.4 02/04/2024 1106   HGB 12.1 02/20/2022 1555   HCT 44.1 02/04/2024 1106   HCT 37.5 02/20/2022 1555   PLT 247 02/04/2024 1106   PLT 192 02/20/2022 1555   MCV 87.5 02/04/2024 1106   MCV 92 02/20/2022 1555   MCH 28.6 02/04/2024 1106   MCHC 32.7 02/04/2024 1106   RDW 13.1 02/04/2024 1106   RDW 12.8 02/20/2022 1555   LYMPHSABS 1.5 02/01/2024 0241   LYMPHSABS 2.5 12/22/2020 1629   MONOABS 0.4 02/01/2024 0241   EOSABS 0.1 02/01/2024 0241   EOSABS 0.1 12/22/2020 1629   BASOSABS 0.0 02/01/2024 0241   BASOSABS 0.0 12/22/2020 1629    ASSESSMENT AND PLAN: 1. Annual physical exam (Primary) Encouraged her to continue healthy eating habits and trying to move as much as she can with goal of getting in about 30 minutes 5 days a week of moderate intensity exercise.  2. Essential hypertension Not at goal being 130/80 or lower.  We will start low-dose of amlodipine  5 mg daily.  Continue Cozaar  100 mg daily. - losartan  (COZAAR ) 100 MG tablet; Take 1 tablet (100 mg total) by mouth daily.  Dispense: 90 tablet; Refill: 1 - For home use only DME Other see comment - amLODipine  (NORVASC ) 5 MG tablet; Take 1 tablet (5 mg  total) by mouth daily.  Dispense: 90 tablet; Refill: 1 - Lipid panel  3. Seizure disorder (HCC) Advised against stopping any of her seizure medications abruptly.  Recommend that she call Dr. Merita Staples office today to report the S.E she is having with the valproic acid . - levETIRAcetam  (KEPPRA ) 750 MG tablet; Take 2 tablets (1,500 mg total) by mouth 2 (two) times daily.  Dispense: 120 tablet; Refill: 4  4. Dental cavities Recommend calling her insurance and getting the names of a few dentists that are in network for her plan.  She should then call and schedule an appointment with a dentist  5. Thyroid  goiter Reporting no compressive symptoms at this time.  Due for repeat thyroid  ultrasound - US  THYROID ; Future - TSH+T4F+T3Free  6. Unintended weight loss Most likely related to high-dose Topamax  that she is on for seizure disorder.  Recent CBC was normal.  She is up-to-date with colon cancer screening.  Will order mammogram to get her up-to-date with breast cancer screening. Check TSH.  7. Need for hepatitis C screening test - Hepatitis C Antibody  8. Pneumococcal vaccination declined   9. Herpes zoster vaccination declined   10. Encounter for screening mammogram for malignant neoplasm of breast - MM 3D SCREENING MAMMOGRAM BILATERAL BREAST; Future  11. Poor vision - Ambulatory referral to Ophthalmology  12. Varicose veins of leg with edema, bilateral Reports some increased swelling in the left leg with prolonged standing or walking.  Recommend wearing compression socks below the knee.    Patient was given the opportunity to ask questions.  Patient verbalized understanding of the plan and was able to repeat key elements of the plan.   This documentation was completed using Paediatric nurse.  Any transcriptional errors are unintentional.  Orders Placed This Encounter  Procedures   For home use only DME Other see comment   MM 3D SCREENING MAMMOGRAM BILATERAL  BREAST   US  THYROID    Lipid panel   TSH+T4F+T3Free   Hepatitis C Antibody   Ambulatory referral to Ophthalmology     Requested Prescriptions   Signed Prescriptions Disp Refills   levETIRAcetam  (KEPPRA ) 750 MG tablet 120 tablet 4    Sig: Take 2 tablets (1,500 mg total) by mouth 2 (two) times daily.   losartan  (COZAAR ) 100 MG tablet 90 tablet 1    Sig: Take 1 tablet (100 mg total) by mouth daily.   amLODipine  (NORVASC ) 5 MG tablet 90 tablet 1    Sig: Take 1 tablet (5 mg total) by mouth daily.    Return in about 4 months (around 07/29/2024).  Concetta Dee, MD, FACP

## 2024-03-29 LAB — LIPID PANEL
Chol/HDL Ratio: 2.7 ratio (ref 0.0–4.4)
Cholesterol, Total: 206 mg/dL — ABNORMAL HIGH (ref 100–199)
HDL: 76 mg/dL (ref >39)
LDL Chol Calc (NIH): 120 mg/dL — ABNORMAL HIGH (ref 0–99)
Triglycerides: 54 mg/dL (ref 0–149)
VLDL Cholesterol Cal: 10 mg/dL (ref 5–40)

## 2024-03-29 LAB — TSH+T4F+T3FREE
Free T4: 1.05 ng/dL (ref 0.82–1.77)
T3, Free: 2.4 pg/mL (ref 2.0–4.4)
TSH: 0.587 u[IU]/mL (ref 0.450–4.500)

## 2024-03-29 LAB — HEPATITIS C ANTIBODY: Hep C Virus Ab: NONREACTIVE

## 2024-03-31 ENCOUNTER — Other Ambulatory Visit

## 2024-04-07 ENCOUNTER — Other Ambulatory Visit

## 2024-04-09 ENCOUNTER — Ambulatory Visit: Payer: Self-pay

## 2024-04-10 ENCOUNTER — Other Ambulatory Visit

## 2024-04-15 ENCOUNTER — Ambulatory Visit
Admission: RE | Admit: 2024-04-15 | Discharge: 2024-04-15 | Disposition: A | Discharge status: Home / Self Care | Source: Ambulatory Visit | Attending: Internal Medicine | Admitting: Internal Medicine

## 2024-04-15 DIAGNOSIS — E041 Nontoxic single thyroid nodule: Secondary | ICD-10-CM | POA: Diagnosis not present

## 2024-04-15 DIAGNOSIS — E049 Nontoxic goiter, unspecified: Secondary | ICD-10-CM

## 2024-04-18 ENCOUNTER — Other Ambulatory Visit: Payer: Self-pay

## 2024-04-22 ENCOUNTER — Ambulatory Visit: Payer: BLUE CROSS/BLUE SHIELD | Admitting: Internal Medicine

## 2024-04-22 NOTE — Progress Notes (Deleted)
 Name: Tammy [name]  MRN/ DOB: [id number], 21-Nov-1966    Age/ Sex: 58 y.o., female    PCP: Lawrance Presume, MD   Reason for Endocrinology Evaluation: MNG     Date of Initial Endocrinology Evaluation: 10/25/2022    HPI: Ms. Tammy [name] is a 58 y.o. female with a past medical history of MNG and HTN, Hx of Left LE DVT. The patient presented for initial endocrinology clinic visit on 10/25/2022 for consultative assistance with her MNG.   Patient was diagnosed with multinodular goiter based on thyroid  ultrasound 10/2019 with 2 nodules meeting FNA criteria.  She is s/p benign FNA of 4 cm isthmic nodule on 12/04/2019 and 3.6 cm left inferior nodule on 12/04/2019   Repeat ultrasound in March 2023 shows stability of these nodules   No family history of thyroid  disease  SUBJECTIVE:    Today (04/22/24): Ms. Tammy [name] is here for follow-up on multinodular goiter.    She denies  local neck symptoms, pain or dysphagia  Denies palpitations  Denies diarrhea or loose stools  Has occasional left hand tremors She is unclear about anxiety    HISTORY:  Past Medical History:  Past Medical History:  Diagnosis Date   Asthma    Blood transfusion without reported diagnosis    'a long time ago"   GERD (gastroesophageal reflux disease)    Headache(784.0)    Hypertension    Seizures (HCC)    Past Surgical History:  Past Surgical History:  Procedure Laterality Date   HERNIA REPAIR     UTERINE FIBROID SURGERY      Social History:  reports that she has never smoked. She has never used smokeless tobacco. She reports current drug use. Drug: Marijuana. She reports that she does not drink alcohol. Family History: family history includes Heart attack in her father; Heart disease in her mother; Migraines in her mother.   HOME MEDICATIONS: Allergies as of 04/22/2024       Reactions   Dilantin  [phenytoin ] Swelling   Aspirin Other (See Comments)   Patient unsure of reaction     Pork-derived Products Other (See Comments)   Tramadol Itching, Other (See Comments)   Pt states she ins unsure of reaction Pt states she ins unsure of reaction Pt states she ins unsure of reaction Pt states she ins unsure of reaction        Medication List        Accurate as of Apr 22, 2024  7:26 AM. If you have any questions, ask your nurse or doctor.          acetaminophen  500 MG tablet Commonly known as: TYLENOL  Take 1 tablet (500 mg total) by mouth every 8 (eight) hours as needed.   amLODipine  5 MG tablet Commonly known as: NORVASC  Take 1 tablet (5 mg total) by mouth daily.   clonazePAM  0.5 MG tablet Commonly known as: KLONOPIN  Take 1 tablet (0.5 mg total) by mouth 2 (two) times daily.   cyclobenzaprine  10 MG tablet Commonly known as: FLEXERIL  Take 1 tablet (10 mg total) by mouth 2 (two) times daily as needed for muscle spasms.   diazepam  10 MG tablet Commonly known as: VALIUM  Take 10 mg by mouth.   levETIRAcetam  750 MG tablet Commonly known as: KEPPRA  Take 2 tablets (1,500 mg total) by mouth 2 (two) times daily.   lidocaine  5 % Commonly known as: Lidoderm  Place 1 patch onto the skin daily. Remove & Discard patch within 12 hours or as  directed by MD   losartan  100 MG tablet Commonly known as: COZAAR  Take 1 tablet (100 mg total) by mouth daily.   MULTI-VITAMIN GUMMIES PO Take 1 tablet by mouth daily.   topiramate  200 MG tablet Commonly known as: TOPAMAX  Take 1 tablet (200 mg total) by mouth 2 (two) times daily.   valproic acid  250 MG capsule Commonly known as: DEPAKENE  Take 2 capsules by mouth 2 (two) times daily.   Valtoco  15 MG Dose 2 x 7.5 MG/0.1ML Lqpk Generic drug: diazePAM  (15 MG Dose) Place 15 mg into the nose as needed (seizure lasting over 2 minutes).   Ventolin  HFA 108 (90 Base) MCG/ACT inhaler Generic drug: albuterol  Inhale 2 puffs into the lungs every 6 (six) hours as needed for wheezing or shortness of breath.          REVIEW  OF SYSTEMS: A comprehensive ROS was conducted with the patient and is negative except as per HPI    OBJECTIVE:  VS: LMP 03/08/2016    Wt Readings from Last 3 Encounters:  03/28/24 123 lb (55.8 kg)  02/05/24 124 lb 12.5 oz (56.6 kg)  02/01/24 130 lb (59 kg)     EXAM: General: Pt appears well and is in NAD  Eyes: External eye exam normal without stare, lid lag or exophthalmos.  EOM intact.   Neck: General: Supple without adenopathy. Thyroid : Isthmic nodules appreciated on today's exam, unable to appreciate left thyroid  nodule.  Lungs: Clear with good BS bilat   Heart: Auscultation: RRR.  Extremities:  BL LE: No pretibial edema   Mental Status: Judgment, insight: Intact Orientation: Oriented to time, place, and person Mood and affect: No depression, anxiety, or agitation     DATA REVIEWED:   Latest Reference Range & Units 03/28/24 11:30  TSH 0.450 - 4.500 uIU/mL 0.587  Triiodothyronine,Free,Serum 2.0 - 4.4 pg/mL 2.4  T4,Free(Direct) 0.82 - 1.77 ng/dL 1.61     Thyroid  ultrasound 04/17/2024 Estimated total number of nodules >/= 1 cm: 2   Number of spongiform nodules >/=  2 cm not described below (TR1): 0   Number of mixed cystic and solid nodules >/= 1.5 cm not described below (TR2): 0   _________________________________________________________   Nodule # 1: The previously biopsied nodule in the thyroid  isthmus remains stable to slightly smaller at 3.6 x 2.0 x 3.3 cm compared to 4.0 x 3.6 x 2.2 cm previously.   Nodule # 2: Previously biopsied nodule in the left mid gland remains similar at 4.0 x 3.6 x 2.0 cm compared to 3.6 x 2.7 x 2.4 cm previously. Precise measurement is difficult given the relatively large size of the lesion.   IMPRESSION: Similar appearance of previously biopsied nodules in the thyroid  isthmus and left mid gland.   No new nodules or suspicious features.   FNA isthmic nodule 12/04/2019 Clinical History: 4.0 cm 3.8 cm 2.4 cm, Thyroid  mid  isthmus  Specimen Submitted:  A. THYROID , ISTHMUS, FINE NEEDLE ASPIRATION:    FINAL MICROSCOPIC DIAGNOSIS:  - Consistent with benign follicular nodule (Bethesda category II)    FNA left nodule 12/04/2019  Clinical History: 3.6 cm x 2.5 cm x 2.4 cm, Left inferior thyroid  Specimen Submitted:  A. THYROID , LEFT, FINE NEEDLE ASPIRATION:   FINAL MICROSCOPIC DIAGNOSIS: - Consistent with benign follicular nodule (Bethesda category II)      ASSESSMENT/PLAN/RECOMMENDATIONS:   Multinodular goiter:  - No local neck symptoms  - Pt is clinically and biochemically euthyroid  -She is s/p 3.6 cm left inferior nodule  and isthmic 4.0 cm nodule with benign cytology in 2021 -Will repeat thyroid  ultrasound for this year   Follow-up in 1 year  Signed electronically by: Natale Bail, MD  [facility] Endocrinology  [facility] [address] [place] [place], [place] [address] Phone: [phone] FAX: [phone]   CC: Lawrance Presume, MD [address] [address] [place] [address] Phone: [phone] Fax: [phone]   Return to Endocrinology clinic as below: Future Appointments  Date Time Provider Department Center  04/22/2024 10:30 AM Kaho Selle, Julian Obey, MD [id number] None  05/19/2024  3:00 PM [facility] 3 [facility]  07/31/2024 10:10 AM Lawrance Presume, MD [id number] None

## 2024-04-25 ENCOUNTER — Other Ambulatory Visit: Payer: Self-pay

## 2024-04-29 ENCOUNTER — Ambulatory Visit: Admitting: Internal Medicine

## 2024-04-29 NOTE — Progress Notes (Deleted)
 Name: Tammy [name]  MRN/ DOB: [id number], May 13, 1966    Age/ Sex: 58 y.o., female    PCP: Lawrance Presume, MD   Reason for Endocrinology Evaluation: MNG     Date of Initial Endocrinology Evaluation: 10/25/2022    HPI: Ms. Tammy [name] is a 58 y.o. female with a past medical history of MNG and HTN, Hx of Left LE DVT. The patient presented for initial endocrinology clinic visit on 10/25/2022 for consultative assistance with her MNG.   Patient was diagnosed with multinodular goiter based on thyroid  ultrasound 10/2019 with 2 nodules meeting FNA criteria.  She is s/p benign FNA of 4 cm isthmic nodule on 12/04/2019 and 3.6 cm left inferior nodule on 12/04/2019   Repeat ultrasound in March 2023 shows stability of these nodules   No family history of thyroid  disease  SUBJECTIVE:    Today (04/29/24): Ms. Tammy [name] is here for follow-up on multinodular goiter.    She denies  local neck symptoms, pain or dysphagia  Denies palpitations  Denies diarrhea or loose stools  Has occasional left hand tremors She is unclear about anxiety    HISTORY:  Past Medical History:  Past Medical History:  Diagnosis Date   Asthma    Blood transfusion without reported diagnosis    'a long time ago"   GERD (gastroesophageal reflux disease)    Headache(784.0)    Hypertension    Seizures (HCC)    Past Surgical History:  Past Surgical History:  Procedure Laterality Date   HERNIA REPAIR     UTERINE FIBROID SURGERY      Social History:  reports that she has never smoked. She has never used smokeless tobacco. She reports current drug use. Drug: Marijuana. She reports that she does not drink alcohol. Family History: family history includes Heart attack in her father; Heart disease in her mother; Migraines in her mother.   HOME MEDICATIONS: Allergies as of 04/29/2024       Reactions   Dilantin  [phenytoin ] Swelling   Aspirin Other (See Comments)   Patient unsure of reaction     Pork-derived Products Other (See Comments)   Tramadol Itching, Other (See Comments)   Pt states she ins unsure of reaction Pt states she ins unsure of reaction Pt states she ins unsure of reaction Pt states she ins unsure of reaction        Medication List        Accurate as of April 29, 2024  7:31 AM. If you have any questions, ask your nurse or doctor.          acetaminophen  500 MG tablet Commonly known as: TYLENOL  Take 1 tablet (500 mg total) by mouth every 8 (eight) hours as needed.   amLODipine  5 MG tablet Commonly known as: NORVASC  Take 1 tablet (5 mg total) by mouth daily.   clonazePAM  0.5 MG tablet Commonly known as: KLONOPIN  Take 1 tablet (0.5 mg total) by mouth 2 (two) times daily.   cyclobenzaprine  10 MG tablet Commonly known as: FLEXERIL  Take 1 tablet (10 mg total) by mouth 2 (two) times daily as needed for muscle spasms.   diazepam  10 MG tablet Commonly known as: VALIUM  Take 10 mg by mouth.   levETIRAcetam  750 MG tablet Commonly known as: KEPPRA  Take 2 tablets (1,500 mg total) by mouth 2 (two) times daily.   lidocaine  5 % Commonly known as: Lidoderm  Place 1 patch onto the skin daily. Remove & Discard patch within 12 hours or as  directed by MD   losartan  100 MG tablet Commonly known as: COZAAR  Take 1 tablet (100 mg total) by mouth daily.   MULTI-VITAMIN GUMMIES PO Take 1 tablet by mouth daily.   topiramate  200 MG tablet Commonly known as: TOPAMAX  Take 1 tablet (200 mg total) by mouth 2 (two) times daily.   valproic acid  250 MG capsule Commonly known as: DEPAKENE  Take 2 capsules by mouth 2 (two) times daily.   Valtoco  15 MG Dose 2 x 7.5 MG/0.1ML Lqpk Generic drug: diazePAM  (15 MG Dose) Place 15 mg into the nose as needed (seizure lasting over 2 minutes).   Ventolin  HFA 108 (90 Base) MCG/ACT inhaler Generic drug: albuterol  Inhale 2 puffs into the lungs every 6 (six) hours as needed for wheezing or shortness of breath.          REVIEW  OF SYSTEMS: A comprehensive ROS was conducted with the patient and is negative except as per HPI    OBJECTIVE:  VS: LMP 03/08/2016    Wt Readings from Last 3 Encounters:  03/28/24 123 lb (55.8 kg)  02/05/24 124 lb 12.5 oz (56.6 kg)  02/01/24 130 lb (59 kg)     EXAM: General: Pt appears well and is in NAD  Eyes: External eye exam normal without stare, lid lag or exophthalmos.  EOM intact.   Neck: General: Supple without adenopathy. Thyroid : Isthmic nodules appreciated on today's exam, unable to appreciate left thyroid  nodule.  Lungs: Clear with good BS bilat   Heart: Auscultation: RRR.  Extremities:  BL LE: No pretibial edema   Mental Status: Judgment, insight: Intact Orientation: Oriented to time, place, and person Mood and affect: No depression, anxiety, or agitation     DATA REVIEWED:   Latest Reference Range & Units 03/28/24 11:30  TSH 0.450 - 4.500 uIU/mL 0.587  Triiodothyronine,Free,Serum 2.0 - 4.4 pg/mL 2.4  T4,Free(Direct) 0.82 - 1.77 ng/dL 0.81     Thyroid  ultrasound 04/17/2024 Estimated total number of nodules >/= 1 cm: 2   Number of spongiform nodules >/=  2 cm not described below (TR1): 0   Number of mixed cystic and solid nodules >/= 1.5 cm not described below (TR2): 0   _________________________________________________________   Nodule # 1: The previously biopsied nodule in the thyroid  isthmus remains stable to slightly smaller at 3.6 x 2.0 x 3.3 cm compared to 4.0 x 3.6 x 2.2 cm previously.   Nodule # 2: Previously biopsied nodule in the left mid gland remains similar at 4.0 x 3.6 x 2.0 cm compared to 3.6 x 2.7 x 2.4 cm previously. Precise measurement is difficult given the relatively large size of the lesion.   IMPRESSION: Similar appearance of previously biopsied nodules in the thyroid  isthmus and left mid gland.   No new nodules or suspicious features.   FNA isthmic nodule 12/04/2019 Clinical History: 4.0 cm 3.8 cm 2.4 cm, Thyroid  mid  isthmus  Specimen Submitted:  A. THYROID , ISTHMUS, FINE NEEDLE ASPIRATION:    FINAL MICROSCOPIC DIAGNOSIS:  - Consistent with benign follicular nodule (Bethesda category II)    FNA left nodule 12/04/2019  Clinical History: 3.6 cm x 2.5 cm x 2.4 cm, Left inferior thyroid  Specimen Submitted:  A. THYROID , LEFT, FINE NEEDLE ASPIRATION:   FINAL MICROSCOPIC DIAGNOSIS: - Consistent with benign follicular nodule (Bethesda category II)      ASSESSMENT/PLAN/RECOMMENDATIONS:   Multinodular goiter:  - No local neck symptoms  - Pt is clinically and biochemically euthyroid  -She is s/p 3.6 cm left inferior nodule  and isthmic 4.0 cm nodule with benign cytology in 2021 - Thyroid  ultrasound 03/2024 shows stability    Follow-up in 1 year  Signed electronically by: Natale Bail, MD  [facility] Endocrinology  [facility] [address] [place] [place], [place] [address] Phone: [phone] FAX: [phone]   CC: Lawrance Presume, MD [address] [place] [address] [place] [address] Phone: [phone] Fax: [phone]   Return to Endocrinology clinic as below: Future Appointments  Date Time Provider Department Center  04/29/2024 11:50 AM Khalifa Knecht, Julian Obey, MD [facility] None  05/19/2024  3:00 PM [facility] MM 3 [facility]  07/31/2024 10:10 AM Lawrance Presume, MD [facility] None

## 2024-05-02 ENCOUNTER — Emergency Department (HOSPITAL_COMMUNITY)

## 2024-05-02 ENCOUNTER — Emergency Department (HOSPITAL_COMMUNITY)
Admission: EM | Admit: 2024-05-02 | Discharge: 2024-05-03 | Disposition: A | Discharge status: Home / Self Care | Attending: Emergency Medicine | Admitting: Emergency Medicine

## 2024-05-02 DIAGNOSIS — I10 Essential (primary) hypertension: Secondary | ICD-10-CM | POA: Diagnosis not present

## 2024-05-02 DIAGNOSIS — R569 Unspecified convulsions: Secondary | ICD-10-CM | POA: Diagnosis not present

## 2024-05-02 DIAGNOSIS — Z79899 Other long term (current) drug therapy: Secondary | ICD-10-CM | POA: Diagnosis not present

## 2024-05-02 DIAGNOSIS — Z8616 Personal history of COVID-19: Secondary | ICD-10-CM | POA: Diagnosis not present

## 2024-05-02 DIAGNOSIS — R404 Transient alteration of awareness: Secondary | ICD-10-CM | POA: Diagnosis not present

## 2024-05-02 DIAGNOSIS — G40909 Epilepsy, unspecified, not intractable, without status epilepticus: Secondary | ICD-10-CM | POA: Insufficient documentation

## 2024-05-02 DIAGNOSIS — T679XXA Effect of heat and light, unspecified, initial encounter: Secondary | ICD-10-CM | POA: Diagnosis not present

## 2024-05-02 DIAGNOSIS — J45909 Unspecified asthma, uncomplicated: Secondary | ICD-10-CM | POA: Insufficient documentation

## 2024-05-02 DIAGNOSIS — I517 Cardiomegaly: Secondary | ICD-10-CM | POA: Diagnosis not present

## 2024-05-02 LAB — CBC WITH DIFFERENTIAL/PLATELET
Abs Immature Granulocytes: 0.02 10*3/uL (ref 0.00–0.07)
Basophils Absolute: 0 10*3/uL (ref 0.0–0.1)
Basophils Relative: 1 %
Eosinophils Absolute: 0.1 10*3/uL (ref 0.0–0.5)
Eosinophils Relative: 2 %
HCT: 36.1 % (ref 36.0–46.0)
Hemoglobin: 11.2 g/dL — ABNORMAL LOW (ref 12.0–15.0)
Immature Granulocytes: 0 %
Lymphocytes Relative: 34 %
Lymphs Abs: 2 10*3/uL (ref 0.7–4.0)
MCH: 28.3 pg (ref 26.0–34.0)
MCHC: 31 g/dL (ref 30.0–36.0)
MCV: 91.2 fL (ref 80.0–100.0)
Monocytes Absolute: 0.4 10*3/uL (ref 0.1–1.0)
Monocytes Relative: 7 %
Neutro Abs: 3.4 10*3/uL (ref 1.7–7.7)
Neutrophils Relative %: 56 %
Platelets: 241 10*3/uL (ref 150–400)
RBC: 3.96 MIL/uL (ref 3.87–5.11)
RDW: 13.6 % (ref 11.5–15.5)
WBC: 5.9 10*3/uL (ref 4.0–10.5)
nRBC: 0 % (ref 0.0–0.2)

## 2024-05-02 LAB — COMPREHENSIVE METABOLIC PANEL WITH GFR
ALT: 10 U/L (ref 0–44)
AST: 19 U/L (ref 15–41)
Albumin: 3.6 g/dL (ref 3.5–5.0)
Alkaline Phosphatase: 49 U/L (ref 38–126)
Anion gap: 12 (ref 5–15)
BUN: 11 mg/dL (ref 6–20)
CO2: 23 mmol/L (ref 22–32)
Calcium: 9.1 mg/dL (ref 8.9–10.3)
Chloride: 106 mmol/L (ref 98–111)
Creatinine, Ser: 0.93 mg/dL (ref 0.44–1.00)
GFR, Estimated: >60 mL/min (ref >60)
Glucose, Bld: 102 mg/dL — ABNORMAL HIGH (ref 70–99)
Potassium: 3.3 mmol/L — ABNORMAL LOW (ref 3.5–5.1)
Sodium: 141 mmol/L (ref 135–145)
Total Bilirubin: 0.3 mg/dL (ref 0.0–1.2)
Total Protein: 6.3 g/dL — ABNORMAL LOW (ref 6.5–8.1)

## 2024-05-02 LAB — CBG MONITORING, ED: Glucose-Capillary: 102 mg/dL — ABNORMAL HIGH (ref 70–99)

## 2024-05-02 LAB — MAGNESIUM: Magnesium: 1.7 mg/dL (ref 1.7–2.4)

## 2024-05-02 LAB — VALPROIC ACID LEVEL: Valproic Acid Lvl: <10 ug/mL — ABNORMAL LOW (ref 50–100)

## 2024-05-02 MED ORDER — SODIUM CHLORIDE 0.9 % IV BOLUS
1000.0000 mL | Freq: Once | INTRAVENOUS | Status: AC
  Administered 2024-05-02: 1000 mL via INTRAVENOUS

## 2024-05-02 MED ORDER — POTASSIUM CHLORIDE CRYS ER 20 MEQ PO TBCR
40.0000 meq | EXTENDED_RELEASE_TABLET | Freq: Once | ORAL | Status: AC
  Administered 2024-05-03: 40 meq via ORAL
  Filled 2024-05-02: qty 2

## 2024-05-02 MED ORDER — LEVETIRACETAM (KEPPRA) 500 MG/5 ML ADULT IV PUSH
2000.0000 mg | Freq: Once | INTRAVENOUS | Status: AC
  Administered 2024-05-02: 2000 mg via INTRAVENOUS
  Filled 2024-05-02: qty 20

## 2024-05-02 NOTE — ED Notes (Signed)
 [phone] pt son Zquira would like a call when their is a update available.

## 2024-05-02 NOTE — ED Triage Notes (Signed)
 Pt arrived via EMS with c/o seizure while walking to the store with family. Pt has hx of seizures. Unknown if compliant with meds. Unknown if hit head when fell. En route pt had another seizure, EMS gave total of 5mg  midazolam. Pt responsive to pain but otherwise she is resting comfortably. Airway intact.

## 2024-05-02 NOTE — ED Provider Notes (Signed)
 [place] EMERGENCY DEPARTMENT AT [facility] Provider Note  CSN: [id number] Arrival date & time: 05/02/24 2048  Chief Complaint(s) Seizures  HPI Tammy [name] is a 58 y.o. female history of seizure disorder, hypertension presenting with seizure.  Patient was with family, apparently had seizure.  History limited.  Patient was around her about.  She reports she has been compliant with her medications.  She had 1 seizure with family and on the second seizure with EMS.  She received Versed.  She denies any pain or other symptoms.  Reports she has been feeling normal recently with no fevers or headaches, no vomiting.  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 Authorizing Provider  acetaminophen  (TYLENOL ) 500 MG tablet Take 1 tablet (500 mg total) by mouth every 8 (eight) hours as needed. 04/09/23   Lawrance Presume, MD  albuterol  (VENTOLIN  HFA) 108 (90 Base) MCG/ACT inhaler Inhale 2 puffs into the lungs every 6 (six) hours as needed for wheezing or shortness of breath. 04/09/23   Lawrance Presume, MD  amLODipine  (NORVASC ) 5 MG tablet Take 1 tablet (5 mg total) by mouth  daily. 03/28/24   Lawrance Presume, MD  clonazePAM  (KLONOPIN ) 0.5 MG tablet Take 1 tablet (0.5 mg total) by mouth 2 (two) times daily. 02/06/24   Omar Bibber, DO  cyclobenzaprine  (FLEXERIL ) 10 MG tablet Take 1 tablet (10 mg total) by mouth 2 (two) times daily as needed for muscle spasms. 03/11/24   Robinson, John K, PA-C  diazepam  (VALIUM ) 10 MG tablet Take 10 mg by mouth. 02/22/24   [provider]  diazePAM , 15 MG Dose, (VALTOCO  15 MG DOSE) 2 x 7.5 MG/0.1ML LQPK Place 15 mg into the nose as needed (seizure lasting over 2 minutes). 02/06/24   Omar Bibber, DO  levETIRAcetam  (KEPPRA ) 750 MG tablet Take 2 tablets (1,500 mg total) by mouth 2 (two) times daily. 03/28/24   Lawrance Presume, MD  lidocaine  (LIDODERM ) 5 % Place 1 patch onto the skin daily. Remove & Discard patch within 12 hours or as directed by MD 03/11/24   Janalee Mcmurray, PA-C  losartan  (COZAAR ) 100 MG tablet Take 1 tablet (100 mg total) by mouth daily. 03/28/24   Lawrance Presume, MD  Multiple Vitamins-Minerals (MULTI-VITAMIN GUMMIES PO) Take 1 tablet by mouth daily.    [provider]  topiramate  (TOPAMAX ) 200 MG tablet Take 1 tablet (200 mg total) by mouth 2 (two) times daily. 12/12/23   Jhonny Moss, MD  valproic acid  (DEPAKENE ) 250 MG capsule Take 2 capsules by mouth 2 (two) times daily. Patient not taking: Reported on 03/28/2024 03/04/24  [provider]  lisinopril -hydrochlorothiazide  (ZESTORETIC ) 10-12.5 MG tablet Take 2 tablets by mouth daily. To lower blood pressure Patient not taking: Reported on 12/05/2020 08/12/20 12/08/20  Senaida Dama, NP                                                                                                                                    Past Surgical History Past Surgical History:  Procedure Laterality Date   HERNIA REPAIR     UTERINE FIBROID SURGERY     Family History Family History  Problem Relation Age of Onset   Heart disease Mother    Migraines Mother     Heart attack Father    Colon cancer Neg Hx     Social History Social History   Tobacco Use   Smoking status: Never   Smokeless tobacco: Never  Vaping Use   Vaping status: Never Used  Substance Use Topics   Alcohol use: No   Drug use: Yes    Types: Marijuana   Allergies Dilantin  [phenytoin ], Aspirin, Pork-derived products, and Tramadol  Review of Systems Review of Systems  All other systems reviewed and are negative.   Physical Exam Vital Signs  I have reviewed the triage vital signs BP 113/77 (BP Location: Left Arm)   Pulse 77   Temp 97.7 F (36.5 C) (Oral)   Resp (!) 21   LMP 03/08/2016   SpO2 100%  Physical Exam Vitals and nursing note reviewed.  Constitutional:      General: She is not in acute distress.    Appearance: She is well-developed.  HENT:     Head: Normocephalic and atraumatic.     Mouth/Throat:     Mouth: Mucous membranes are moist.  Eyes:     Pupils: Pupils are equal, round, and reactive to light.  Cardiovascular:     Rate and Rhythm: Normal rate and regular rhythm.     Heart sounds: No murmur heard. Pulmonary:     Effort: Pulmonary effort is normal. No respiratory distress.     Breath sounds: Normal breath sounds.  Abdominal:     General: Abdomen is flat.     Palpations: Abdomen is soft.     Tenderness: There is no abdominal tenderness.  Musculoskeletal:        General: No tenderness.     Right lower leg: No edema.     Left lower leg: No edema.  Skin:    General: Skin is warm and dry.  Neurological:     Mental Status: She is alert and oriented to person, place, and time.     Comments: Sleepy, follows commands in all 4 extremities, no cranial nerve deficit.  Psychiatric:        Mood and Affect: Mood normal.        Behavior: Behavior normal.     ED Results and Treatments Labs (all labs ordered are listed, but only abnormal results  are displayed) Labs Reviewed  COMPREHENSIVE METABOLIC PANEL WITH GFR - Abnormal; Notable for the  following components:      Result Value   Potassium 3.3 (*)    Glucose, Bld 102 (*)    Total Protein 6.3 (*)    All other components within normal limits  CBC WITH DIFFERENTIAL/PLATELET - Abnormal; Notable for the following components:   Hemoglobin 11.2 (*)    All other components within normal limits  VALPROIC ACID  LEVEL - Abnormal; Notable for the following components:   Valproic Acid  Lvl <10 (*)    All other components within normal limits  CBG MONITORING, ED - Abnormal; Notable for the following components:   Glucose-Capillary 102 (*)    All other components within normal limits  MAGNESIUM  URINALYSIS, W/ REFLEX TO CULTURE (INFECTION SUSPECTED)  LEVETIRACETAM  LEVEL  RAPID URINE DRUG SCREEN, HOSP PERFORMED                                                                                                                          Radiology DG Chest Portable 1 View Result Date: 05/02/2024 CLINICAL DATA:  Seizure EXAM: PORTABLE CHEST 1 VIEW COMPARISON:  02/02/2024 FINDINGS: Cardiomegaly. Lungs clear. No effusions or edema. No acute bony abnormality. IMPRESSION: Cardiomegaly.  No active disease. Electronically Signed   By: Janeece Mechanic M.D.   On: 05/02/2024 21:28    Pertinent labs & imaging results that were available during my care of the patient were reviewed by me and considered in my medical decision making (see MDM for details).  Medications Ordered in ED Medications  potassium chloride  SA (KLOR-CON  M) CR tablet 40 mEq (has no administration in time range)  sodium chloride  0.9 % bolus 1,000 mL (0 mLs Intravenous Stopped 05/02/24 2357)  levETIRAcetam  (KEPPRA ) undiluted injection 2,000 mg (2,000 mg Intravenous Given 05/02/24 2144)                                                                                                                                     Procedures Procedures  (including critical care time)  Medical Decision Making / ED Course   MDM:  58 year old  presenting to the emergency department after seizure.  Patient well-appearing.  She has known seizure disorder.  No signs of head trauma or injury.  Still remains sleepy.  On review of chart patient stopped taking Depakote  and her Depakote  level was negative.  Suspect this could be contributing.  She received dose of Keppra .  Metabolic workup so far with mild hypokalemia, otherwise negative.  Chest x-ray clear.  Urinalysis pending.  Patient not back to baseline will need some further observation.  Will sign out to oncoming physician Dr. Carol Chroman.      Additional history obtained: -Additional history obtained from ems -External records from outside source obtained and reviewed including: Chart review including previous notes, labs, imaging, consultation notes including prior notes    Lab Tests: -I ordered, reviewed, and interpreted labs.   The pertinent results include:   Labs Reviewed  COMPREHENSIVE METABOLIC PANEL WITH GFR - Abnormal; Notable for the following components:      Result Value   Potassium 3.3 (*)    Glucose, Bld 102 (*)    Total Protein 6.3 (*)    All other components within normal limits  CBC WITH DIFFERENTIAL/PLATELET - Abnormal; Notable for the following components:   Hemoglobin 11.2 (*)    All other components within normal limits  VALPROIC ACID  LEVEL - Abnormal; Notable for the following components:   Valproic Acid  Lvl <10 (*)    All other components within normal limits  CBG MONITORING, ED - Abnormal; Notable for the following components:   Glucose-Capillary 102 (*)    All other components within normal limits  MAGNESIUM  URINALYSIS, W/ REFLEX TO CULTURE (INFECTION SUSPECTED)  LEVETIRACETAM  LEVEL  RAPID URINE DRUG SCREEN, HOSP PERFORMED    Notable for mild hypokalemia   EKG   EKG Interpretation Date/Time:  Friday May 02 2024 21:02:46 EDT Ventricular Rate:  74 PR Interval:  166 QRS Duration:  82 QT Interval:  392 QTC Calculation: 435 R  Axis:   38  Text Interpretation: Sinus rhythm LAE, consider biatrial enlargement Low voltage, precordial leads Confirmed by Hiawatha Lout ([id number]) on 05/02/2024 11:32:38 PM         Imaging Studies ordered: I ordered imaging studies including CXR On my interpretation imaging demonstrates no acute process I independently visualized and interpreted imaging. I agree with the radiologist interpretation   Medicines ordered and prescription drug management: Meds ordered this encounter  Medications   sodium chloride  0.9 % bolus 1,000 mL   levETIRAcetam  (KEPPRA ) undiluted injection 2,000 mg   potassium chloride  SA (KLOR-CON  M) CR tablet 40 mEq    -I have reviewed the patients home medicines and have made adjustments as needed   Reevaluation: After the interventions noted above, I reevaluated the patient and found that their symptoms have improved  Co morbidities that complicate the patient evaluation  Past Medical History:  Diagnosis Date   Asthma    Blood transfusion without reported diagnosis    'a long time ago"   GERD (gastroesophageal reflux disease)    Headache(784.0)    Hypertension    Seizures (HCC)       Dispostion: Disposition decision including need for hospitalization was considered, and patient disposition pending at time of sign out.    Final Clinical Impression(s) / ED Diagnoses Final diagnoses:  Seizure disorder [facility])     This chart was dictated using voice recognition software.  Despite best efforts to proofread,  errors can occur which can change the documentation meaning.    Mordecai Applebaum, MD 05/02/24 [phone]

## 2024-05-03 ENCOUNTER — Other Ambulatory Visit: Payer: Self-pay

## 2024-05-03 LAB — URINALYSIS, W/ REFLEX TO CULTURE (INFECTION SUSPECTED)
Bilirubin Urine: NEGATIVE
Glucose, UA: NEGATIVE mg/dL
Hgb urine dipstick: NEGATIVE
Ketones, ur: NEGATIVE mg/dL
Nitrite: NEGATIVE
Protein, ur: NEGATIVE mg/dL
Specific Gravity, Urine: 1.017 (ref 1.005–1.030)
pH: 6 (ref 5.0–8.0)

## 2024-05-03 LAB — RAPID URINE DRUG SCREEN, HOSP PERFORMED
Amphetamines: NOT DETECTED
Barbiturates: NOT DETECTED
Benzodiazepines: POSITIVE — AB
Cocaine: NOT DETECTED
Opiates: NOT DETECTED
Tetrahydrocannabinol: POSITIVE — AB

## 2024-05-03 LAB — CBG MONITORING, ED: Glucose-Capillary: 102 mg/dL — ABNORMAL HIGH (ref 70–99)

## 2024-05-03 NOTE — ED Provider Notes (Signed)
 Signed out 3 by Dr. Isaiah Marc.  Patient seen for seizure, has a known seizure disorder.  She has been monitored and is back to her normal baseline.  Urinalysis without infection.  Patient reports that she has all of her medications at home, does not require any prescriptions.  Appropriate for discharge.   Ballard Bongo, MD 05/03/24 [phone]

## 2024-05-04 ENCOUNTER — Emergency Department (HOSPITAL_COMMUNITY)
Admission: EM | Admit: 2024-05-04 | Discharge: 2024-05-04 | Disposition: A | Discharge status: Home / Self Care | Attending: Emergency Medicine | Admitting: Emergency Medicine

## 2024-05-04 ENCOUNTER — Encounter (HOSPITAL_COMMUNITY): Payer: Self-pay | Admitting: Emergency Medicine

## 2024-05-04 ENCOUNTER — Other Ambulatory Visit: Payer: Self-pay

## 2024-05-04 DIAGNOSIS — Z79899 Other long term (current) drug therapy: Secondary | ICD-10-CM | POA: Insufficient documentation

## 2024-05-04 DIAGNOSIS — G4489 Other headache syndrome: Secondary | ICD-10-CM | POA: Diagnosis not present

## 2024-05-04 DIAGNOSIS — R569 Unspecified convulsions: Secondary | ICD-10-CM | POA: Insufficient documentation

## 2024-05-04 DIAGNOSIS — I10 Essential (primary) hypertension: Secondary | ICD-10-CM | POA: Insufficient documentation

## 2024-05-04 DIAGNOSIS — J45909 Unspecified asthma, uncomplicated: Secondary | ICD-10-CM | POA: Diagnosis not present

## 2024-05-04 DIAGNOSIS — Z743 Need for continuous supervision: Secondary | ICD-10-CM | POA: Diagnosis not present

## 2024-05-04 DIAGNOSIS — R410 Disorientation, unspecified: Secondary | ICD-10-CM | POA: Diagnosis not present

## 2024-05-04 LAB — CBC WITH DIFFERENTIAL/PLATELET
Abs Immature Granulocytes: 0.02 10*3/uL (ref 0.00–0.07)
Basophils Absolute: 0 10*3/uL (ref 0.0–0.1)
Basophils Relative: 1 %
Eosinophils Absolute: 0 10*3/uL (ref 0.0–0.5)
Eosinophils Relative: 0 %
HCT: 36.2 % (ref 36.0–46.0)
Hemoglobin: 11.4 g/dL — ABNORMAL LOW (ref 12.0–15.0)
Immature Granulocytes: 0 %
Lymphocytes Relative: 14 %
Lymphs Abs: 1 10*3/uL (ref 0.7–4.0)
MCH: 27.9 pg (ref 26.0–34.0)
MCHC: 31.5 g/dL (ref 30.0–36.0)
MCV: 88.5 fL (ref 80.0–100.0)
Monocytes Absolute: 0.4 10*3/uL (ref 0.1–1.0)
Monocytes Relative: 6 %
Neutro Abs: 5.8 10*3/uL (ref 1.7–7.7)
Neutrophils Relative %: 79 %
Platelets: 237 10*3/uL (ref 150–400)
RBC: 4.09 MIL/uL (ref 3.87–5.11)
RDW: 13.6 % (ref 11.5–15.5)
WBC: 7.3 10*3/uL (ref 4.0–10.5)
nRBC: 0 % (ref 0.0–0.2)

## 2024-05-04 LAB — LEVETIRACETAM LEVEL: Levetiracetam Lvl: 2.8 ug/mL — ABNORMAL LOW (ref 10.0–40.0)

## 2024-05-04 LAB — BASIC METABOLIC PANEL WITH GFR
Anion gap: 9 (ref 5–15)
BUN: 9 mg/dL (ref 6–20)
CO2: 23 mmol/L (ref 22–32)
Calcium: 9.3 mg/dL (ref 8.9–10.3)
Chloride: 108 mmol/L (ref 98–111)
Creatinine, Ser: 0.84 mg/dL (ref 0.44–1.00)
GFR, Estimated: >60 mL/min (ref >60)
Glucose, Bld: 86 mg/dL (ref 70–99)
Potassium: 4 mmol/L (ref 3.5–5.1)
Sodium: 140 mmol/L (ref 135–145)

## 2024-05-04 MED ORDER — LEVETIRACETAM (KEPPRA) 500 MG/5 ML ADULT IV PUSH
1000.0000 mg | Freq: Once | INTRAVENOUS | Status: AC
  Administered 2024-05-04: 1000 mg via INTRAVENOUS
  Filled 2024-05-04: qty 10

## 2024-05-04 NOTE — Discharge Instructions (Addendum)
 Please resume taking your antiepileptic medications previously prescribed and follow-up closely with your neurologist for further management which may include medication adjustment or medication changes to fit with your need.

## 2024-05-04 NOTE — ED Provider Notes (Signed)
 [place] EMERGENCY DEPARTMENT AT [facility] Provider Note   CSN: [id number] Arrival date & time: 05/04/24  1103     History  No chief complaint on file.   Tammy [name] is a 58 y.o. female.  The history is provided by the patient, the EMS personnel and medical records. No language interpreter was used.     58 year old female history of seizures noncompliant with her medications, hypertension, asthma, GERD brought here via EMS from home with concerns of seizure activity.  Patient was seen in the ED 2 days ago for seizure activities and subsequently discharged home after she received a loading dose of Keppra .  This morning while in bed her son noticed that patient was staring into space and was seizing and EMS was contacted to bring her here.  At this time patient is more alert and oriented and able to answer questions.  Patient states she believes the broom that she staining is a bit too hot which may trigger seizures.  Last seizure episode was 7 days prior.  She did report stopping 2 of her seizure medication including Topamax  and Depakote  several days prior because it makes her feel unwell.  She does take her Keppra  as prescribed but did not take it this morning. She does admits to regular marijuana use but denies any other drug use.  Denies any other changes.  She did bite her cheek but denies any second soreness and denies any urinary or bowel incontinence.  No other complaint.    Home Medications Prior to Admission medications   Medication Sig Start Date End Date Taking? Authorizing Provider  acetaminophen  (TYLENOL ) 500 MG tablet Take 1 tablet (500 mg total) by mouth every 8 (eight) hours as needed. 04/09/23   Lawrance Presume, MD  albuterol  (VENTOLIN  HFA) 108 (90 Base) MCG/ACT inhaler Inhale 2 puffs into the lungs every 6 (six) hours as needed for wheezing or shortness of breath. 04/09/23   Lawrance Presume, MD  amLODipine  (NORVASC ) 5 MG tablet Take 1 tablet (5 mg  total) by mouth daily. 03/28/24   Lawrance Presume, MD  clonazePAM  (KLONOPIN ) 0.5 MG tablet Take 1 tablet (0.5 mg total) by mouth 2 (two) times daily. 02/06/24   Omar Bibber, DO  cyclobenzaprine  (FLEXERIL ) 10 MG tablet Take 1 tablet (10 mg total) by mouth 2 (two) times daily as needed for muscle spasms. 03/11/24   Robinson, John K, PA-C  diazepam  (VALIUM ) 10 MG tablet Take 10 mg by mouth. 02/22/24   [provider]  diazePAM , 15 MG Dose, (VALTOCO  15 MG DOSE) 2 x 7.5 MG/0.1ML LQPK Place 15 mg into the nose as needed (seizure lasting over 2 minutes). 02/06/24   Omar Bibber, DO  levETIRAcetam  (KEPPRA ) 750 MG tablet Take 2 tablets (1,500 mg total) by mouth 2 (two) times daily. 03/28/24   Lawrance Presume, MD  lidocaine  (LIDODERM ) 5 % Place 1 patch onto the skin daily. Remove & Discard patch within 12 hours or as directed by MD 03/11/24   Janalee Mcmurray, PA-C  losartan  (COZAAR ) 100 MG tablet Take 1 tablet (100 mg total) by mouth daily. 03/28/24   Lawrance Presume, MD  Multiple Vitamins-Minerals (MULTI-VITAMIN GUMMIES PO) Take 1 tablet by mouth daily.    [provider]  topiramate  (TOPAMAX ) 200 MG tablet Take 1 tablet (200 mg total) by mouth 2 (two) times daily. 12/12/23   Jhonny Moss, MD  valproic acid  (DEPAKENE ) 250 MG capsule Take 2 capsules by mouth  2 (two) times daily. Patient not taking: Reported on 03/28/2024 03/04/24   [provider]  lisinopril -hydrochlorothiazide  (ZESTORETIC ) 10-12.5 MG tablet Take 2 tablets by mouth daily. To lower blood pressure Patient not taking: Reported on 12/05/2020 08/12/20 12/08/20  Senaida Dama, NP      Allergies    Dilantin  [phenytoin ], Aspirin, Pork-derived products, and Tramadol    Review of Systems   Review of Systems  All other systems reviewed and are negative.   Physical Exam Updated Vital Signs BP (!) 162/98   Pulse 60   Temp 98.1 F (36.7 C) (Oral)   Resp 11   Ht 5\' 2"  (1.575 m)   Wt 61.2 kg   LMP 03/08/2016   SpO2  100%   BMI 24.69 kg/m  Physical Exam Vitals and nursing note reviewed.  Constitutional:      General: She is not in acute distress.    Appearance: She is well-developed.  HENT:     Head: Atraumatic.     Mouth/Throat:     Comments: Minimal bucca mucosa skin irritation bilaterally without any deep laceration noted.  No tongue injury. Eyes:     Conjunctiva/sclera: Conjunctivae normal.  Cardiovascular:     Rate and Rhythm: Normal rate and regular rhythm.     Pulses: Normal pulses.     Heart sounds: Normal heart sounds.  Pulmonary:     Effort: Pulmonary effort is normal.  Abdominal:     Palpations: Abdomen is soft.     Tenderness: There is no abdominal tenderness.  Musculoskeletal:     Cervical back: Neck supple.  Skin:    Findings: No rash.  Neurological:     Mental Status: She is alert and oriented to person, place, and time.     GCS: GCS eye subscore is 4. GCS verbal subscore is 5. GCS motor subscore is 6.     Cranial Nerves: Cranial nerves 2-12 are intact.     Sensory: Sensation is intact.     Motor: Motor function is intact.     Coordination: Coordination is intact.  Psychiatric:        Mood and Affect: Mood normal.     ED Results / Procedures / Treatments   Labs (all labs ordered are listed, but only abnormal results are displayed) Labs Reviewed  CBC WITH DIFFERENTIAL/PLATELET - Abnormal; Notable for the following components:      Result Value   Hemoglobin 11.4 (*)    All other components within normal limits  BASIC METABOLIC PANEL WITH GFR    EKG None  Radiology DG Chest Portable 1 View Result Date: 05/02/2024 CLINICAL DATA:  Seizure EXAM: PORTABLE CHEST 1 VIEW COMPARISON:  02/02/2024 FINDINGS: Cardiomegaly. Lungs clear. No effusions or edema. No acute bony abnormality. IMPRESSION: Cardiomegaly.  No active disease. Electronically Signed   By: Janeece Mechanic M.D.   On: 05/02/2024 21:28    Procedures Procedures    Medications Ordered in ED Medications   levETIRAcetam  (KEPPRA ) undiluted injection 1,000 mg (1,000 mg Intravenous Given 05/04/24 1203)    ED Course/ Medical Decision Making/ A&P                                 Medical Decision Making Amount and/or Complexity of Data Reviewed Labs: ordered.   BP (!) 162/98   Pulse 60   Temp 98.1 F (36.7 C) (Oral)   Resp 11   Ht 5\' 2"  (1.575 m)  Wt 61.2 kg   LMP 03/08/2016   SpO2 100%   BMI 24.69 kg/m   18:63 AM  58 year old female history of seizures noncompliant with her medications, hypertension, asthma, GERD brought here via EMS from home with concerns of seizure activity.  Patient was seen in the ED 2 days ago for seizure activities and subsequently discharged home after she received a loading dose of Keppra .  This morning while in bed her son noticed that patient was staring into space and was seizing and EMS was contacted to bring her here.  At this time patient is more alert and oriented and able to answer questions.  Patient states she believes the broom that she staining is a bit too hot which may trigger seizures.  Last seizure episode was 7 days prior.  She did report stopping 2 of her seizure medication including Topamax  and Depakote  several days prior because it makes her feel unwell.  She does take her Keppra  as prescribed but did not take it this morning. She does admits to regular marijuana use but denies any other drug use.  Denies any other changes.  She did bite her cheek but denies any second soreness and denies any urinary or bowel incontinence.  No other complaint.  On exam patient is resting comfortably appears to be in no acute discomfort.  She is alert and oriented x 3.  She is without normal strength throughout.  She has some mild inner mucosal irritation in her mouth without any laceration or tongue injury.  No other signs of trauma.  EMR reviewed patient was seen in the ED 3 days ago and was discharged 2 days ago for seizure activity.  Her Depakote  at that time  was undetectable.  Patient received Keppra  loading dose.  -Labs ordered, independently viewed and interpreted by me.  Labs remarkable for reassuring labs, electrolyte panels are reassuring -The patient was maintained on a cardiac monitor.  I personally viewed and interpreted the cardiac monitored which showed an underlying rhythm of: Sinus bradycardia -Imaging including head CT scan, brain MRI, EEG considered but felt her symptom is likely due to breakthrough seizure not compliant with the medication and no signs of head trauma -This patient presents to the ED for concern of seizure, this involves an extensive number of treatment options, and is a complaint that carries with it a high risk of complications and morbidity.  The differential diagnosis includes breakthrough seizure, metabolic derangement, infectious etiology, polysubstance use, cardiac insult -Co morbidities that complicate the patient evaluation includes seizure -Treatment includes Keppra  -Reevaluation of the patient after these medicines showed that the patient improved -PCP office notes or outside notes reviewed -Escalation to admission/observation considered: patients feels much better, is comfortable with discharge, and will follow up with neurologist -Prescription medication considered, patient comfortable with resuming her seizure meds and discussed further medication changes with her neurologist -Social Determinant of Health considered         Final Clinical Impression(s) / ED Diagnoses Final diagnoses:  Seizure [facility])    Rx / DC Orders ED Discharge Orders     None         Debbra Fairy, PA-C 05/04/24 1442    Trish Furl, MD 05/05/24 [phone]

## 2024-05-04 NOTE — ED Triage Notes (Signed)
 Pt BIB [facility] from home due to seizure.  Pt was found on bed having a focal seizure by son "staring at wall."  Pt did bite her cheek.  Hx seizure and takes her meds.  AOX4 CBG 92, BP 138/86

## 2024-05-06 ENCOUNTER — Ambulatory Visit: Admitting: Internal Medicine

## 2024-05-06 ENCOUNTER — Encounter: Payer: Self-pay | Admitting: Internal Medicine

## 2024-05-06 NOTE — Progress Notes (Deleted)
 Name: Tammy [name]  MRN/ DOB: [id number], 13-May-1966    Age/ Sex: 58 y.o., female    PCP: Lawrance Presume, MD   Reason for Endocrinology Evaluation: MNG     Date of Initial Endocrinology Evaluation: 10/25/2022    HPI: Tammy [name] is a 58 y.o. female with a past medical history of MNG and HTN, Hx of Left LE DVT. The patient presented for initial endocrinology clinic visit on 10/25/2022 for consultative assistance with her MNG.   Patient was diagnosed with multinodular goiter based on thyroid  ultrasound 10/2019 with 2 nodules meeting FNA criteria.  She is s/p benign FNA of 4 cm isthmic nodule on 12/04/2019 and 3.6 cm left inferior nodule on 12/04/2019   Repeat ultrasound in March 2023 shows stability of these nodules   No family history of thyroid  disease  SUBJECTIVE:    Today (05/06/24): Tammy [name] is here for follow-up on multinodular goiter.    She denies  local neck symptoms, pain or dysphagia  Denies palpitations  Denies diarrhea or loose stools  Has occasional left hand tremors She is unclear about anxiety    HISTORY:  Past Medical History:  Past Medical History:  Diagnosis Date   Asthma    Blood transfusion without reported diagnosis    'a long time ago"   GERD (gastroesophageal reflux disease)    Headache(784.0)    Hypertension    Seizures (HCC)    Past Surgical History:  Past Surgical History:  Procedure Laterality Date   HERNIA REPAIR     UTERINE FIBROID SURGERY      Social History:  reports that she has never smoked. She has never used smokeless tobacco. She reports current drug use. Drug: Marijuana. She reports that she does not drink alcohol. Family History: family history includes Heart attack in her father; Heart disease in her mother; Migraines in her mother.   HOME MEDICATIONS: Allergies as of 05/06/2024       Reactions   Dilantin  [phenytoin ] Swelling   Aspirin Other (See Comments)   Patient unsure of reaction     Pork-derived Products Other (See Comments)   Tramadol Itching, Other (See Comments)   Pt states she ins unsure of reaction Pt states she ins unsure of reaction Pt states she ins unsure of reaction Pt states she ins unsure of reaction        Medication List        Accurate as of May 06, 2024  7:07 AM. If you have any questions, ask your nurse or doctor.          acetaminophen  500 MG tablet Commonly known as: TYLENOL  Take 1 tablet (500 mg total) by mouth every 8 (eight) hours as needed.   amLODipine  5 MG tablet Commonly known as: NORVASC  Take 1 tablet (5 mg total) by mouth daily.   clonazePAM  0.5 MG tablet Commonly known as: KLONOPIN  Take 1 tablet (0.5 mg total) by mouth 2 (two) times daily.   cyclobenzaprine  10 MG tablet Commonly known as: FLEXERIL  Take 1 tablet (10 mg total) by mouth 2 (two) times daily as needed for muscle spasms.   diazepam  10 MG tablet Commonly known as: VALIUM  Take 10 mg by mouth.   levETIRAcetam  750 MG tablet Commonly known as: KEPPRA  Take 2 tablets (1,500 mg total) by mouth 2 (two) times daily.   lidocaine  5 % Commonly known as: Lidoderm  Place 1 patch onto the skin daily. Remove & Discard patch within 12 hours or as  directed by MD   losartan  100 MG tablet Commonly known as: COZAAR  Take 1 tablet (100 mg total) by mouth daily.   MULTI-VITAMIN GUMMIES PO Take 1 tablet by mouth daily.   topiramate  200 MG tablet Commonly known as: TOPAMAX  Take 1 tablet (200 mg total) by mouth 2 (two) times daily.   valproic acid  250 MG capsule Commonly known as: DEPAKENE  Take 2 capsules by mouth 2 (two) times daily.   Valtoco  15 MG Dose 2 x 7.5 MG/0.1ML Lqpk Generic drug: diazePAM  (15 MG Dose) Place 15 mg into the nose as needed (seizure lasting over 2 minutes).   Ventolin  HFA 108 (90 Base) MCG/ACT inhaler Generic drug: albuterol  Inhale 2 puffs into the lungs every 6 (six) hours as needed for wheezing or shortness of breath.           REVIEW OF SYSTEMS: A comprehensive ROS was conducted with the patient and is negative except as per HPI    OBJECTIVE:  VS: LMP 03/08/2016    Wt Readings from Last 3 Encounters:  05/04/24 135 lb (61.2 kg)  03/28/24 123 lb (55.8 kg)  02/05/24 124 lb 12.5 oz (56.6 kg)     EXAM: General: Pt appears well and is in NAD  Eyes: External eye exam normal without stare, lid lag or exophthalmos.  EOM intact.   Neck: General: Supple without adenopathy. Thyroid : Isthmic nodules appreciated on today's exam, unable to appreciate left thyroid  nodule.  Lungs: Clear with good BS bilat   Heart: Auscultation: RRR.  Extremities:  BL LE: No pretibial edema   Mental Status: Judgment, insight: Intact Orientation: Oriented to time, place, and person Mood and affect: No depression, anxiety, or agitation     DATA REVIEWED:   Latest Reference Range & Units 03/28/24 11:30  TSH 0.450 - 4.500 uIU/mL 0.587  Triiodothyronine,Free,Serum 2.0 - 4.4 pg/mL 2.4  T4,Free(Direct) 0.82 - 1.77 ng/dL 0.98     Thyroid  ultrasound 04/17/2024 Estimated total number of nodules >/= 1 cm: 2   Number of spongiform nodules >/=  2 cm not described below (TR1): 0   Number of mixed cystic and solid nodules >/= 1.5 cm not described below (TR2): 0   _________________________________________________________   Nodule # 1: The previously biopsied nodule in the thyroid  isthmus remains stable to slightly smaller at 3.6 x 2.0 x 3.3 cm compared to 4.0 x 3.6 x 2.2 cm previously.   Nodule # 2: Previously biopsied nodule in the left mid gland remains similar at 4.0 x 3.6 x 2.0 cm compared to 3.6 x 2.7 x 2.4 cm previously. Precise measurement is difficult given the relatively large size of the lesion.   IMPRESSION: Similar appearance of previously biopsied nodules in the thyroid  isthmus and left mid gland.   No new nodules or suspicious features.   FNA isthmic nodule 12/04/2019 Clinical History: 4.0 cm 3.8 cm 2.4 cm,  Thyroid  mid isthmus  Specimen Submitted:  A. THYROID , ISTHMUS, FINE NEEDLE ASPIRATION:    FINAL MICROSCOPIC DIAGNOSIS:  - Consistent with benign follicular nodule (Bethesda category II)    FNA left nodule 12/04/2019  Clinical History: 3.6 cm x 2.5 cm x 2.4 cm, Left inferior thyroid  Specimen Submitted:  A. THYROID , LEFT, FINE NEEDLE ASPIRATION:   FINAL MICROSCOPIC DIAGNOSIS: - Consistent with benign follicular nodule (Bethesda category II)      ASSESSMENT/PLAN/RECOMMENDATIONS:   Multinodular goiter:  - No local neck symptoms  - Pt is clinically and biochemically euthyroid  -She is s/p 3.6 cm left inferior nodule  and isthmic 4.0 cm nodule with benign cytology in 2021 - Thyroid  ultrasound 03/2024 shows stability    Follow-up in 1 year  Signed electronically by: Natale Bail, MD  [facility] Endocrinology  [facility] [address] [place] [place], [place] [address] Phone: [phone] FAX: [phone]   CC: Lawrance Presume, MD [address] [place] [address] Phone: [phone] Fax: [phone]   Return to Endocrinology clinic as below: Future Appointments  Date Time Provider Department Center  05/06/2024  9:30 AM Maelie Chriswell, Julian Obey, MD [facility] None  05/19/2024  3:00 PM [facility] MM 3 [facility]  07/31/2024 10:10 AM Lawrance Presume, MD [facility] None

## 2024-05-19 ENCOUNTER — Ambulatory Visit
Admission: RE | Admit: 2024-05-19 | Discharge: 2024-05-19 | Disposition: A | Discharge status: Home / Self Care | Source: Ambulatory Visit | Attending: Internal Medicine | Admitting: Internal Medicine

## 2024-05-19 DIAGNOSIS — Z1231 Encounter for screening mammogram for malignant neoplasm of breast: Secondary | ICD-10-CM | POA: Diagnosis not present

## 2024-05-26 ENCOUNTER — Ambulatory Visit (INDEPENDENT_AMBULATORY_CARE_PROVIDER_SITE_OTHER): Admitting: Internal Medicine

## 2024-05-26 ENCOUNTER — Encounter: Payer: Self-pay | Admitting: Internal Medicine

## 2024-05-26 VITALS — BP 124/76 | HR 57 | Ht 62.0 in | Wt 127.0 lb

## 2024-05-26 DIAGNOSIS — E042 Nontoxic multinodular goiter: Secondary | ICD-10-CM | POA: Diagnosis not present

## 2024-05-26 NOTE — Progress Notes (Addendum)
 Name: Tammy [name]  MRN/ DOB: [id number], [birth year]    Age/ Sex: 58 y.o., female    PCP: Vicci Barnie NOVAK, MD   Reason for Endocrinology Evaluation: MNG     Date of Initial Endocrinology Evaluation: 10/25/2022    HPI: Tammy [name] is a 58 y.o. female with a past medical history of MNG and HTN, Hx of Left LE DVT. The patient presented for initial endocrinology clinic visit on 10/25/2022 for consultative assistance with her MNG.   Patient was diagnosed with multinodular goiter based on thyroid  ultrasound 10/2019 with 2 nodules meeting FNA criteria.  She is s/p benign FNA of 4 cm isthmic nodule on 12/04/2019 and 3.6 cm left inferior nodule on 12/04/2019   Repeat ultrasound in March 2023 shows stability of these nodules   No family history of thyroid  disease  SUBJECTIVE:    Today (05/26/24): Tammy [name] is here for follow-up on multinodular goiter.  She denies  local neck symptoms Denies palpitations  Denies diarrhea or loose stools  Has occasional constipation  Minimal stable tremors    HISTORY:  Past Medical History:  Past Medical History:  Diagnosis Date   Asthma    Blood transfusion without reported diagnosis    'a long time ago   GERD (gastroesophageal reflux disease)    Headache(784.0)    Hypertension    Seizures (HCC)    Past Surgical History:  Past Surgical History:  Procedure Laterality Date   HERNIA REPAIR     UTERINE FIBROID SURGERY      Social History:  reports that she has never smoked. She has never used smokeless tobacco. She reports current drug use. Drug: Marijuana. She reports that she does not drink alcohol. Family History: family history includes Heart attack in her father; Heart disease in her mother; Migraines in her mother.   HOME MEDICATIONS: Allergies as of 05/26/2024       Reactions   Dilantin  [phenytoin ] Swelling   Aspirin Other (See Comments)   Patient unsure of reaction    Pork-derived Products Other (See  Comments)   Tramadol Itching, Other (See Comments)   Pt states she ins unsure of reaction Pt states she ins unsure of reaction Pt states she ins unsure of reaction Pt states she ins unsure of reaction        Medication List        Accurate as of May 26, 2024 10:11 AM. If you have any questions, ask your nurse or doctor.          acetaminophen  500 MG tablet Commonly known as: TYLENOL  Take 1 tablet (500 mg total) by mouth every 8 (eight) hours as needed.   amLODipine  5 MG tablet Commonly known as: NORVASC  Take 1 tablet (5 mg total) by mouth daily.   clonazePAM  0.5 MG tablet Commonly known as: KLONOPIN  Take 1 tablet (0.5 mg total) by mouth 2 (two) times daily.   cyclobenzaprine  10 MG tablet Commonly known as: FLEXERIL  Take 1 tablet (10 mg total) by mouth 2 (two) times daily as needed for muscle spasms.   diazepam  10 MG tablet Commonly known as: VALIUM  Take 10 mg by mouth.   levETIRAcetam  750 MG tablet Commonly known as: KEPPRA  Take 2 tablets (1,500 mg total) by mouth 2 (two) times daily.   lidocaine  5 % Commonly known as: Lidoderm  Place 1 patch onto the skin daily. Remove & Discard patch within 12 hours or as directed by MD   losartan  100 MG tablet Commonly  known as: COZAAR  Take 1 tablet (100 mg total) by mouth daily.   MULTI-VITAMIN GUMMIES PO Take 1 tablet by mouth daily.   topiramate  200 MG tablet Commonly known as: TOPAMAX  Take 1 tablet (200 mg total) by mouth 2 (two) times daily.   valproic acid  250 MG capsule Commonly known as: DEPAKENE  Take 2 capsules by mouth 2 (two) times daily.   Valtoco  15 MG Dose 2 x 7.5 MG/0.1ML Lqpk Generic drug: diazePAM  (15 MG Dose) Place 15 mg into the nose as needed (seizure lasting over 2 minutes).   Ventolin  HFA 108 (90 Base) MCG/ACT inhaler Generic drug: albuterol  Inhale 2 puffs into the lungs every 6 (six) hours as needed for wheezing or shortness of breath.          REVIEW OF SYSTEMS: A comprehensive  ROS was conducted with the patient and is negative except as per HPI    OBJECTIVE:  VS: BP 124/76 (BP Location: Left Arm, Patient Position: Sitting, Cuff Size: Normal)   Pulse (!) 57   Ht 5' 2 (1.575 m)   Wt 127 lb (57.6 kg)   LMP 03/08/2016   SpO2 99%   BMI 23.23 kg/m    Wt Readings from Last 3 Encounters:  05/26/24 127 lb (57.6 kg)  05/04/24 135 lb (61.2 kg)  03/28/24 123 lb (55.8 kg)     EXAM: General: Pt appears well and is in NAD  Neck: General: Supple without adenopathy. Thyroid : Isthmic and left thyroid  nodule appreciated on today's exam  Lungs: Clear with good BS bilat   Heart: Auscultation: RRR.  Extremities:  BL LE: No pretibial edema   Mental Status: Judgment, insight: Intact Orientation: Oriented to time, place, and person Mood and affect: No depression, anxiety, or agitation     DATA REVIEWED:   Latest Reference Range & Units 03/28/24 11:30  TSH 0.450 - 4.500 uIU/mL 0.587  Triiodothyronine,Free,Serum 2.0 - 4.4 pg/mL 2.4  T4,Free(Direct) 0.82 - 1.77 ng/dL 8.94     Thyroid  ultrasound 04/17/2024 Estimated total number of nodules >/= 1 cm: 2   Number of spongiform nodules >/=  2 cm not described below (TR1): 0   Number of mixed cystic and solid nodules >/= 1.5 cm not described below (TR2): 0   _________________________________________________________   Nodule # 1: The previously biopsied nodule in the thyroid  isthmus remains stable to slightly smaller at 3.6 x 2.0 x 3.3 cm compared to 4.0 x 3.6 x 2.2 cm previously.   Nodule # 2: Previously biopsied nodule in the left mid gland remains similar at 4.0 x 3.6 x 2.0 cm compared to 3.6 x 2.7 x 2.4 cm previously. Precise measurement is difficult given the relatively large size of the lesion.   IMPRESSION: Similar appearance of previously biopsied nodules in the thyroid  isthmus and left mid gland.   No new nodules or suspicious features.   FNA isthmic nodule 12/04/2019 Clinical History: 4.0 cm 3.8  cm 2.4 cm, Thyroid  mid isthmus  Specimen Submitted:  A. THYROID , ISTHMUS, FINE NEEDLE ASPIRATION:    FINAL MICROSCOPIC DIAGNOSIS:  - Consistent with benign follicular nodule (Bethesda category II)    FNA left nodule 12/04/2019  Clinical History: 3.6 cm x 2.5 cm x 2.4 cm, Left inferior thyroid  Specimen Submitted:  A. THYROID , LEFT, FINE NEEDLE ASPIRATION:   FINAL MICROSCOPIC DIAGNOSIS: - Consistent with benign follicular nodule (Bethesda category II)    Old records , labs and images have been reviewed.    ASSESSMENT/PLAN/RECOMMENDATIONS:   Multinodular goiter:  -  No local neck symptoms  - Pt is clinically and biochemically euthyroid  -She is s/p 3.6 cm left inferior nodule and isthmic 4.0 cm nodule with benign cytology in 2021 - Thyroid  ultrasound 03/2024 shows stability - We entertained the idea of RFA, if needed in the future   Follow-up in 1 year  Signed electronically by: Stefano Redgie Butts, MD  [facility] Endocrinology  [facility] [address] [place] [place], [place] [address] Phone: [phone] FAX: [phone]   CC: Vicci Barnie NOVAK, MD [address] [place] [address] Phone: [phone] Fax: [phone]   Return to Endocrinology clinic as below: Future Appointments  Date Time Provider Department Center  05/26/2024 10:30 AM Dallana Mavity, Donell Redgie, MD [facility] None  07/31/2024 10:10 AM Vicci Barnie NOVAK, MD [facility] None

## 2024-06-03 ENCOUNTER — Ambulatory Visit: Admitting: Internal Medicine

## 2024-06-22 ENCOUNTER — Emergency Department (HOSPITAL_COMMUNITY)

## 2024-06-22 ENCOUNTER — Other Ambulatory Visit: Payer: Self-pay

## 2024-06-22 ENCOUNTER — Emergency Department (HOSPITAL_COMMUNITY): Admission: EM | Admit: 2024-06-22 | Discharge: 2024-06-22 | Disposition: A | Discharge status: Home / Self Care

## 2024-06-22 ENCOUNTER — Encounter (HOSPITAL_COMMUNITY): Payer: Self-pay | Admitting: Emergency Medicine

## 2024-06-22 DIAGNOSIS — I10 Essential (primary) hypertension: Secondary | ICD-10-CM | POA: Diagnosis not present

## 2024-06-22 DIAGNOSIS — D259 Leiomyoma of uterus, unspecified: Secondary | ICD-10-CM | POA: Diagnosis not present

## 2024-06-22 DIAGNOSIS — Z79899 Other long term (current) drug therapy: Secondary | ICD-10-CM | POA: Insufficient documentation

## 2024-06-22 DIAGNOSIS — M1611 Unilateral primary osteoarthritis, right hip: Secondary | ICD-10-CM | POA: Insufficient documentation

## 2024-06-22 DIAGNOSIS — M25551 Pain in right hip: Secondary | ICD-10-CM | POA: Diagnosis not present

## 2024-06-22 DIAGNOSIS — R531 Weakness: Secondary | ICD-10-CM | POA: Diagnosis not present

## 2024-06-22 LAB — COMPREHENSIVE METABOLIC PANEL WITH GFR
ALT: 8 U/L (ref 0–44)
AST: 16 U/L (ref 15–41)
Albumin: 3.5 g/dL (ref 3.5–5.0)
Alkaline Phosphatase: 56 U/L (ref 38–126)
Anion gap: 10 (ref 5–15)
BUN: 7 mg/dL (ref 6–20)
CO2: 23 mmol/L (ref 22–32)
Calcium: 9.4 mg/dL (ref 8.9–10.3)
Chloride: 106 mmol/L (ref 98–111)
Creatinine, Ser: 0.86 mg/dL (ref 0.44–1.00)
GFR, Estimated: >60 mL/min (ref >60)
Glucose, Bld: 77 mg/dL (ref 70–99)
Potassium: 3.8 mmol/L (ref 3.5–5.1)
Sodium: 139 mmol/L (ref 135–145)
Total Bilirubin: 0.3 mg/dL (ref 0.0–1.2)
Total Protein: 6.7 g/dL (ref 6.5–8.1)

## 2024-06-22 LAB — CBC
HCT: 39.2 % (ref 36.0–46.0)
Hemoglobin: 12.1 g/dL (ref 12.0–15.0)
MCH: 27.4 pg (ref 26.0–34.0)
MCHC: 30.9 g/dL (ref 30.0–36.0)
MCV: 88.7 fL (ref 80.0–100.0)
Platelets: 246 K/uL (ref 150–400)
RBC: 4.42 MIL/uL (ref 3.87–5.11)
RDW: 13.3 % (ref 11.5–15.5)
WBC: 6.7 K/uL (ref 4.0–10.5)
nRBC: 0 % (ref 0.0–0.2)

## 2024-06-22 LAB — URINALYSIS, ROUTINE W REFLEX MICROSCOPIC
Bilirubin Urine: NEGATIVE
Glucose, UA: NEGATIVE mg/dL
Hgb urine dipstick: NEGATIVE
Ketones, ur: NEGATIVE mg/dL
Leukocytes,Ua: NEGATIVE
Nitrite: NEGATIVE
Protein, ur: NEGATIVE mg/dL
Specific Gravity, Urine: 1.009 (ref 1.005–1.030)
pH: 7 (ref 5.0–8.0)

## 2024-06-22 LAB — CBG MONITORING, ED: Glucose-Capillary: 92 mg/dL (ref 70–99)

## 2024-06-22 MED ORDER — ONDANSETRON HCL 4 MG/2ML IJ SOLN
4.0000 mg | Freq: Once | INTRAMUSCULAR | Status: AC
  Administered 2024-06-22: 4 mg via INTRAVENOUS
  Filled 2024-06-22: qty 2

## 2024-06-22 MED ORDER — NAPROXEN 500 MG PO TABS
500.0000 mg | ORAL_TABLET | Freq: Two times a day (BID) | ORAL | 0 refills | Status: DC
  Filled 2024-06-22: qty 30, 15d supply, fill #0

## 2024-06-22 MED ORDER — MORPHINE SULFATE (PF) 4 MG/ML IV SOLN
4.0000 mg | Freq: Once | INTRAVENOUS | Status: AC
  Administered 2024-06-22: 4 mg via INTRAVENOUS
  Filled 2024-06-22: qty 1

## 2024-06-22 MED ORDER — KETOROLAC TROMETHAMINE 15 MG/ML IJ SOLN
15.0000 mg | Freq: Once | INTRAMUSCULAR | Status: AC
  Administered 2024-06-22: 15 mg via INTRAVENOUS
  Filled 2024-06-22: qty 1

## 2024-06-22 NOTE — ED Triage Notes (Addendum)
 Pt [facility] from home for R. Sided weakness starting at 9:30pm. Negative stroke screen for EMS.  Fall around 7/11 with pain to right hip,  148/92 HR 80 CBG 85 98% RA

## 2024-06-22 NOTE — ED Provider Notes (Signed)
 [place] EMERGENCY DEPARTMENT AT [facility] Provider Note   CSN: [id number] Arrival date & time: 06/22/24  9149     Patient presents with: No chief complaint on file.   Tammy [name] is a 58 y.o. female.   57 year old female with past medical history of seizures and hypertension presenting to the emergency department today with right sided hip pain.  The patient states that this been going since last night.  Denies any injuries to the area.  She denies any focal weakness but states that the pain has made it difficult to get up and ambulate.  She came to the ER for further evaluation regarding this due to ongoing symptoms.  She denies any fevers or chills.  Did have a fall 2 weeks ago but does not think that this is related and did not really have any pain in her hip prior to this.  She denies any exacerbating or alleviating factors other than movement.        Prior to Admission medications   Medication Sig Start Date End Date Taking? Authorizing Provider  naproxen  (NAPROSYN ) 500 MG tablet Take 1 tablet (500 mg total) by mouth 2 (two) times daily. 06/22/24  Yes Ula Prentice SAUNDERS, MD  acetaminophen  (TYLENOL ) 500 MG tablet Take 1 tablet (500 mg total) by mouth every 8 (eight) hours as needed. 04/09/23   Vicci Barnie NOVAK, MD  albuterol  (VENTOLIN  HFA) 108 [phone] Base) MCG/ACT inhaler Inhale 2 puffs into the lungs every 6 (six) hours as needed for wheezing or shortness of breath. 04/09/23   Vicci Barnie NOVAK, MD  amLODipine  (NORVASC ) 5 MG tablet Take 1 tablet (5 mg total) by mouth daily. 03/28/24   Vicci Barnie NOVAK, MD  clonazePAM  (KLONOPIN ) 0.5 MG tablet Take 1 tablet (0.5 mg total) by mouth 2 (two) times daily. 02/06/24   Lafe Domino, DO  cyclobenzaprine  (FLEXERIL ) 10 MG tablet Take 1 tablet (10 mg total) by mouth 2 (two) times daily as needed for muscle spasms. 03/11/24   Robinson, John K, PA-C  diazepam  (VALIUM ) 10 MG tablet Take 10 mg by mouth. 02/22/24   [provider]   diazePAM , 15 MG Dose, (VALTOCO  15 MG DOSE) 2 x 7.5 MG/0.1ML LQPK Place 15 mg into the nose as needed (seizure lasting over 2 minutes). 02/06/24   Lafe Domino, DO  levETIRAcetam  (KEPPRA ) 750 MG tablet Take 2 tablets (1,500 mg total) by mouth 2 (two) times daily. 03/28/24   Vicci Barnie NOVAK, MD  lidocaine  (LIDODERM ) 5 % Place 1 patch onto the skin daily. Remove & Discard patch within 12 hours or as directed by MD 03/11/24   Lang Norleen POUR, PA-C  losartan  (COZAAR ) 100 MG tablet Take 1 tablet (100 mg total) by mouth daily. 03/28/24   Vicci Barnie NOVAK, MD  Multiple Vitamins-Minerals (MULTI-VITAMIN GUMMIES PO) Take 1 tablet by mouth daily.    [provider]  topiramate  (TOPAMAX ) 200 MG tablet Take 1 tablet (200 mg total) by mouth 2 (two) times daily. 12/12/23   Georjean Darice HERO, MD  valproic  acid (DEPAKENE ) 250 MG capsule Take 2 capsules by mouth 2 (two) times daily. 03/04/24   [provider]  lisinopril -hydrochlorothiazide  (ZESTORETIC ) 10-12.5 MG tablet Take 2 tablets by mouth daily. To lower blood pressure Patient not taking: Reported on 12/05/2020 08/12/20 12/08/20  Lorren Greig PARAS, NP    Allergies: Dilantin  [phenytoin ], Aspirin, Pork-derived products, and Tramadol    Review of Systems  Musculoskeletal:        Right  hip pain  All other systems reviewed and are negative.   Updated Vital Signs BP (!) 162/87 (BP Location: Right Arm)   Pulse 70   Temp 97.8 F (36.6 C) (Oral)   Resp 20   Ht 5' 2 (1.575 m)   Wt 60.8 kg   LMP 03/08/2016   SpO2 99%   BMI 24.51 kg/m   Physical Exam Vitals and nursing note reviewed.   Gen: NAD Eyes: PERRL, EOMI HEENT: no oropharyngeal swelling Neck: trachea midline Resp: clear to auscultation bilaterally Card: RRR, no murmurs, rubs, or gallops Abd: nontender, nondistended MSK:  TTP over R hip with no overlying erythema, compartments are soft, normal ROM but does cause pain Neuro: Equal strength sensation throughout bilateral lower  extremities Vascular: 2+ radial pulses bilaterally, 2+ DP pulses bilaterally Skin: no rashes Psyc: acting appropriately   (all labs ordered are listed, but only abnormal results are displayed) Labs Reviewed  URINALYSIS, ROUTINE W REFLEX MICROSCOPIC - Abnormal; Notable for the following components:      Result Value   Color, Urine STRAW (*)    All other components within normal limits  COMPREHENSIVE METABOLIC PANEL WITH GFR  CBC  CBG MONITORING, ED    EKG: EKG Interpretation Date/Time:  Sunday June 22 2024 09:01:40 EDT Ventricular Rate:  57 PR Interval:  186 QRS Duration:  99 QT Interval:  433 QTC Calculation: 422 R Axis:   21  Text Interpretation: Sinus rhythm Probable left atrial enlargement Borderline low voltage, extremity leads Minimal ST elevation, inferior leads Confirmed by Ula Barter [phone]) on 06/22/2024 9:05:28 AM  Radiology: ARCOLA Hip Unilat W or Wo Pelvis 2-3 Views Right Result Date: 06/22/2024 CLINICAL DATA:  Fall.  Right hip pain. EXAM: DG HIP (WITH OR WITHOUT PELVIS) 2-3V RIGHT COMPARISON:  None Available. FINDINGS: There is no evidence of hip fracture or dislocation. Moderate right hip osteoarthritis is seen. Several calcified uterine fibroids are also noted in the central pelvis. IMPRESSION: No acute findings. Moderate right hip osteoarthritis. Calcified uterine fibroids. Electronically Signed   By: Norleen DELENA Kil M.D.   On: 06/22/2024 10:47     Procedures   Medications Ordered in the ED  morphine  (PF) 4 MG/ML injection 4 mg (4 mg Intravenous Given 06/22/24 1013)  ondansetron  (ZOFRAN ) injection 4 mg (4 mg Intravenous Given 06/22/24 1013)  ketorolac  (TORADOL ) 15 MG/ML injection 15 mg (15 mg Intravenous Given 06/22/24 1117)                                    Medical Decision Making 58 year old female with past medical history of seizures and hypertension presenting to the emergency department today with right hip pain.  Patient has reassuring neurovascular exam.   Does not have any findings on exam consistent with compartment syndrome.  Denies any associated fevers suspicion for septic arthritis or infectious etiology is low at this time.  Will obtain basic labs here to further evaluate for this and obtain an x-ray to evaluate for pathologic fracture, lytic, or blastic lesions.  Will give the patient morphine  and Zofran  for symptoms and if renal function allows we will give her Toradol .  I will reevaluate for ultimate disposition.  The patient's x-ray shows some arthritis which is likely the cause of her symptoms.  Labs are reassuring.  The patient is feeling better on reassessment and is ambulatory here and feeling much better after Toradol .  She will be discharged with  return precautions.  Amount and/or Complexity of Data Reviewed Labs: ordered. Radiology: ordered.  Risk Prescription drug management.        Final diagnoses:  Right hip pain  Arthritis of right hip    ED Discharge Orders          Ordered    naproxen  (NAPROSYN ) 500 MG tablet  2 times daily        06/22/24 1132               Ula Prentice SAUNDERS, MD 06/22/24 1135

## 2024-06-22 NOTE — ED Notes (Signed)
 Patient was walked from her room to the nurse station in blue/orange. Patient stated she did not have any SOB or chest pain. Patient stated that she had no pain but still had tingling. Patient was just given meds right before the walk.

## 2024-06-22 NOTE — Discharge Instructions (Signed)
 Your x-ray showed some arthritis.  Your labs are reassuring.  There were no other concerning findings.  Will send you home on anti-inflammatories.  Please take the naproxen  twice daily.  If you are still having pain you may take 1000 mg of Tylenol  every 6 hours as well.  Please follow-up with your doctor.  If you are having persistent pain they may want to get you to an orthopedic surgeon.  Return to the emergency department for worsening symptoms.

## 2024-06-23 ENCOUNTER — Other Ambulatory Visit: Payer: Self-pay

## 2024-06-24 ENCOUNTER — Other Ambulatory Visit: Payer: Self-pay

## 2024-07-22 ENCOUNTER — Other Ambulatory Visit (HOSPITAL_COMMUNITY): Payer: Self-pay

## 2024-07-22 ENCOUNTER — Other Ambulatory Visit: Payer: Self-pay

## 2024-07-22 ENCOUNTER — Other Ambulatory Visit: Payer: Self-pay | Admitting: Internal Medicine

## 2024-07-23 NOTE — Telephone Encounter (Signed)
 D/C5/5/25. Requested Prescriptions  Refused Prescriptions Disp Refills   divalproex  (DEPAKOTE ) 500 MG DR tablet 60 tablet 0    Sig: Take 1 tablet (500 mg total) by mouth every 12 (twelve) hours.     Neurology:  Anticonvulsants - Valproates Failed - 07/23/2024  2:05 PM      Failed - Valproic  Acid (serum) in normal range and within 360 days    Valproic  Acid Lvl  Date Value Ref Range Status  05/02/2024 <10 (L) 50 - 100 ug/mL Final    Comment:    RESULT CONFIRMED BY MANUAL DILUTION Performed at [facility], [address] [place], [place] [address]          Failed - Completed PHQ-2 or PHQ-9 in the last 360 days      Passed - AST in normal range and within 360 days    AST  Date Value Ref Range Status  06/22/2024 16 15 - 41 U/L Final         Passed - ALT in normal range and within 360 days    ALT  Date Value Ref Range Status  06/22/2024 8 0 - 44 U/L Final         Passed - HGB in normal range and within 360 days    Hemoglobin  Date Value Ref Range Status  06/22/2024 12.1 12.0 - 15.0 g/dL Final  96/72/7976 87.8 11.1 - 15.9 g/dL Final   Hemoglobin Other  Date Value Ref Range Status  05/23/2007 0.0 0.0 - 0.0 Final         Passed - PLT in normal range and within 360 days    Platelets  Date Value Ref Range Status  06/22/2024 246 150 - 400 K/uL Final  02/20/2022 192 150 - 450 x10E3/uL Final         Passed - WBC in normal range and within 360 days    WBC  Date Value Ref Range Status  06/22/2024 6.7 4.0 - 10.5 K/uL Final         Passed - HCT in normal range and within 360 days    HCT  Date Value Ref Range Status  06/22/2024 39.2 36.0 - 46.0 % Final   Hematocrit  Date Value Ref Range Status  02/20/2022 37.5 34.0 - 46.6 % Final         Passed - Patient is not pregnant      Passed - Valid encounter within last 12 months    Recent Outpatient Visits           3 months ago Annual physical exam   [facility] [place]. [facility] Vicci Barnie NOVAK, MD   5 months ago Hospital discharge follow-up   [facility] [place]. [facility] Vicci Barnie NOVAK, MD   1 year ago Pap smear for cervical cancer screening   [facility] [place]. [facility] Vicci Barnie NOVAK, MD   1 year ago Essential hypertension   [facility] [place]. [facility] Vicci Barnie NOVAK, MD   1 year ago Essential hypertension   [facility] [place]. [facility], [place], PA-C

## 2024-07-24 ENCOUNTER — Other Ambulatory Visit: Payer: Self-pay

## 2024-07-30 ENCOUNTER — Telehealth: Payer: Self-pay | Admitting: Internal Medicine

## 2024-07-30 NOTE — Telephone Encounter (Signed)
 Patient confirmed appointment for 07/31/2024, through volunteer call.

## 2024-07-31 ENCOUNTER — Ambulatory Visit: Admitting: Internal Medicine

## 2024-09-08 ENCOUNTER — Emergency Department (HOSPITAL_COMMUNITY): Payer: MEDICAID

## 2024-09-08 ENCOUNTER — Inpatient Hospital Stay (HOSPITAL_COMMUNITY)
Admission: EM | Admit: 2024-09-08 | Discharge: 2024-09-11 | DRG: 101 | Disposition: A | Discharge status: Home / Self Care | Payer: MEDICAID | Attending: Internal Medicine | Admitting: Internal Medicine

## 2024-09-08 ENCOUNTER — Other Ambulatory Visit: Payer: Self-pay

## 2024-09-08 DIAGNOSIS — Z8249 Family history of ischemic heart disease and other diseases of the circulatory system: Secondary | ICD-10-CM

## 2024-09-08 DIAGNOSIS — I10 Essential (primary) hypertension: Secondary | ICD-10-CM | POA: Diagnosis present

## 2024-09-08 DIAGNOSIS — Z886 Allergy status to analgesic agent status: Secondary | ICD-10-CM

## 2024-09-08 DIAGNOSIS — G43909 Migraine, unspecified, not intractable, without status migrainosus: Secondary | ICD-10-CM | POA: Diagnosis present

## 2024-09-08 DIAGNOSIS — R569 Unspecified convulsions: Secondary | ICD-10-CM

## 2024-09-08 DIAGNOSIS — J45909 Unspecified asthma, uncomplicated: Secondary | ICD-10-CM | POA: Diagnosis present

## 2024-09-08 DIAGNOSIS — G40101 Localization-related (focal) (partial) symptomatic epilepsy and epileptic syndromes with simple partial seizures, not intractable, with status epilepticus: Principal | ICD-10-CM | POA: Diagnosis present

## 2024-09-08 DIAGNOSIS — G40901 Epilepsy, unspecified, not intractable, with status epilepticus: Principal | ICD-10-CM

## 2024-09-08 DIAGNOSIS — Z885 Allergy status to narcotic agent status: Secondary | ICD-10-CM

## 2024-09-08 LAB — CBC WITH DIFFERENTIAL/PLATELET
Abs Immature Granulocytes: 0.05 K/uL (ref 0.00–0.07)
Basophils Absolute: 0 K/uL (ref 0.0–0.1)
Basophils Relative: 0 %
Eosinophils Absolute: 0 K/uL (ref 0.0–0.5)
Eosinophils Relative: 0 %
HCT: 42.9 % (ref 36.0–46.0)
Hemoglobin: 13.2 g/dL (ref 12.0–15.0)
Immature Granulocytes: 0 %
Lymphocytes Relative: 7 %
Lymphs Abs: 0.7 K/uL (ref 0.7–4.0)
MCH: 26.6 pg (ref 26.0–34.0)
MCHC: 30.8 g/dL (ref 30.0–36.0)
MCV: 86.5 fL (ref 80.0–100.0)
Monocytes Absolute: 0.4 K/uL (ref 0.1–1.0)
Monocytes Relative: 3 %
Neutro Abs: 10.2 K/uL — ABNORMAL HIGH (ref 1.7–7.7)
Neutrophils Relative %: 90 %
Platelets: 286 K/uL (ref 150–400)
RBC: 4.96 MIL/uL (ref 3.87–5.11)
RDW: 14.4 % (ref 11.5–15.5)
WBC: 11.4 K/uL — ABNORMAL HIGH (ref 4.0–10.5)
nRBC: 0 % (ref 0.0–0.2)

## 2024-09-08 LAB — COMPREHENSIVE METABOLIC PANEL WITH GFR
ALT: 7 U/L (ref 0–44)
AST: 22 U/L (ref 15–41)
Albumin: 4.5 g/dL (ref 3.5–5.0)
Alkaline Phosphatase: 79 U/L (ref 38–126)
Anion gap: 10 (ref 5–15)
BUN: 13 mg/dL (ref 6–20)
CO2: 27 mmol/L (ref 22–32)
Calcium: 10.2 mg/dL (ref 8.9–10.3)
Chloride: 102 mmol/L (ref 98–111)
Creatinine, Ser: 0.87 mg/dL (ref 0.44–1.00)
GFR, Estimated: >60 mL/min (ref >60)
Glucose, Bld: 111 mg/dL — ABNORMAL HIGH (ref 70–99)
Potassium: 4 mmol/L (ref 3.5–5.1)
Sodium: 139 mmol/L (ref 135–145)
Total Bilirubin: 0.4 mg/dL (ref 0.0–1.2)
Total Protein: 8.1 g/dL (ref 6.5–8.1)

## 2024-09-08 LAB — VALPROIC ACID LEVEL: Valproic Acid Lvl: <10 ug/mL — ABNORMAL LOW (ref 50–100)

## 2024-09-08 LAB — SEDIMENTATION RATE: Sed Rate: 10 mm/h (ref 0–22)

## 2024-09-08 LAB — MAGNESIUM: Magnesium: 1.9 mg/dL (ref 1.7–2.4)

## 2024-09-08 MED ORDER — TOPIRAMATE 100 MG PO TABS: 200.0000 mg | ORAL_TABLET | Freq: Once | ORAL | Status: DC

## 2024-09-08 MED ORDER — KETOROLAC TROMETHAMINE 15 MG/ML IJ SOLN
15.0000 mg | Freq: Once | INTRAMUSCULAR | Status: AC
  Administered 2024-09-08: 15 mg via INTRAVENOUS
  Filled 2024-09-08: qty 1

## 2024-09-08 MED ORDER — DIPHENHYDRAMINE HCL 50 MG/ML IJ SOLN
25.0000 mg | Freq: Once | INTRAMUSCULAR | Status: AC
  Administered 2024-09-08: 25 mg via INTRAVENOUS
  Filled 2024-09-08: qty 1

## 2024-09-08 MED ORDER — METOCLOPRAMIDE HCL 5 MG/ML IJ SOLN
10.0000 mg | Freq: Once | INTRAMUSCULAR | Status: AC
  Administered 2024-09-08: 10 mg via INTRAVENOUS
  Filled 2024-09-08: qty 2

## 2024-09-08 MED ORDER — LACTATED RINGERS IV BOLUS
1000.0000 mL | Freq: Once | INTRAVENOUS | Status: AC
  Administered 2024-09-08: 1000 mL via INTRAVENOUS

## 2024-09-08 MED ORDER — VALPROIC ACID 250 MG PO CAPS
500.0000 mg | ORAL_CAPSULE | Freq: Once | ORAL | Status: AC
  Administered 2024-09-08: 500 mg via ORAL
  Filled 2024-09-08: qty 2

## 2024-09-08 MED ORDER — LEVETIRACETAM 500 MG PO TABS
1500.0000 mg | ORAL_TABLET | Freq: Once | ORAL | Status: AC
  Administered 2024-09-08: 1500 mg via ORAL
  Filled 2024-09-08: qty 3

## 2024-09-08 MED ORDER — CLONAZEPAM 0.5 MG PO TABS: 0.5000 mg | ORAL_TABLET | Freq: Once | ORAL | Status: DC

## 2024-09-08 MED ORDER — LORAZEPAM 2 MG/ML IJ SOLN
2.0000 mg | Freq: Once | INTRAMUSCULAR | Status: AC
  Administered 2024-09-08: 2 mg via INTRAVENOUS
  Filled 2024-09-08: qty 1

## 2024-09-08 NOTE — ED Provider Notes (Signed)
  Provider Note MRN:  [id number]  Arrival date & time: 09/09/24    ED Course and Medical Decision Making  Assumed care of patient at sign-out or upon transfer.  Headache after seizure plan is for reassessment, Depakote  level, if back to baseline could be discharged with neurology follow-up.  1 AM update: Patient still very somnolent, per family it sounds like she had 2 seizures without return to baseline in between.  This would raise concern for status epilepticus.  Valproate level undetectable, case discussed with neurology who will follow in consultation.  Providing loading dose of Depakote , will admit to [facility].  .Critical Care  Performed by: Theadore Ozell HERO, MD Authorized by: Theadore Ozell HERO, MD   Critical care provider statement:    Critical care time (minutes):  35   Critical care was necessary to treat or prevent imminent or life-threatening deterioration of the following conditions: Status epilepticus.   Critical care was time spent personally by me on the following activities:  Development of treatment plan with patient or surrogate, discussions with consultants, evaluation of patient's response to treatment, examination of patient, ordering and review of laboratory studies, ordering and review of radiographic studies, ordering and performing treatments and interventions, pulse oximetry, re-evaluation of patient's condition and review of old charts   I assumed direction of critical care for this patient from another provider in my specialty: yes     Care discussed with: admitting provider     Final Clinical Impressions(s) / ED Diagnoses     ICD-10-CM   1. Status epilepticus (HCC)  G40.901       ED Discharge Orders     None       Discharge Instructions   None     Ozell HERO. Theadore, MD [facility] [email or web address]    Theadore Ozell HERO, MD 09/09/24 0111

## 2024-09-08 NOTE — ED Notes (Signed)
 Pt up sitting at the head of the bed, facing the wall. Pt is staring off and will not respond- MD notified, son is at bedside

## 2024-09-08 NOTE — ED Provider Notes (Signed)
 [place] EMERGENCY DEPARTMENT AT [facility] Provider Note   CSN: [id number] Arrival date & time: 09/08/24  1739     Patient presents with: Fatigue   Tammy [name] is a 58 y.o. female.   HPI 58 year old female presents with headache.  Headache is right-sided and has been ongoing for couple days.  She does get headaches similar to this but never this severe.  Today the headache is worse than it has been.  There is also tingling sensation to her right head.  She vomited early on in the course but no further vomiting though is nauseated.  No focal weakness or numbness.  No vision changes.  No fevers, neck stiffness.  Right now the pain is an 8 out of 10.  Prior to Admission medications   Medication Sig Start Date End Date Taking? Authorizing Provider  acetaminophen  (TYLENOL ) 500 MG tablet Take 1 tablet (500 mg total) by mouth every 8 (eight) hours as needed. 04/09/23   Vicci Barnie NOVAK, MD  albuterol  (VENTOLIN  HFA) 108 [phone] Base) MCG/ACT inhaler Inhale 2 puffs into the lungs every 6 (six) hours as needed for wheezing or shortness of breath. 04/09/23   Vicci Barnie NOVAK, MD  amLODipine  (NORVASC ) 5 MG tablet Take 1 tablet (5 mg total) by mouth daily. 03/28/24   Vicci Barnie NOVAK, MD  clonazePAM  (KLONOPIN ) 0.5 MG tablet Take 1 tablet (0.5 mg total) by mouth 2 (two) times daily. 02/06/24   Lafe Domino, DO  cyclobenzaprine  (FLEXERIL ) 10 MG tablet Take 1 tablet (10 mg total) by mouth 2 (two) times daily as needed for muscle spasms. 03/11/24   Robinson, John K, PA-C  diazepam  (VALIUM ) 10 MG tablet Take 10 mg by mouth. 02/22/24   [provider]  diazePAM , 15 MG Dose, (VALTOCO  15 MG DOSE) 2 x 7.5 MG/0.1ML LQPK Place 15 mg into the nose as needed (seizure lasting over 2 minutes). 02/06/24   Lafe Domino, DO  levETIRAcetam  (KEPPRA ) 750 MG tablet Take 2 tablets (1,500 mg total) by mouth 2 (two) times daily. 03/28/24   Vicci Barnie NOVAK, MD  lidocaine  (LIDODERM ) 5 % Place 1 patch  onto the skin daily. Remove & Discard patch within 12 hours or as directed by MD 03/11/24   Lang Norleen POUR, PA-C  losartan  (COZAAR ) 100 MG tablet Take 1 tablet (100 mg total) by mouth daily. 03/28/24   Vicci Barnie NOVAK, MD  Multiple Vitamins-Minerals (MULTI-VITAMIN GUMMIES PO) Take 1 tablet by mouth daily.    [provider]  naproxen  (NAPROSYN ) 500 MG tablet Take 1 tablet (500 mg total) by mouth 2 (two) times daily. 06/22/24   Ula Prentice SAUNDERS, MD  topiramate  (TOPAMAX ) 200 MG tablet Take 1 tablet (200 mg total) by mouth 2 (two) times daily. 12/12/23   Georjean Darice HERO, MD  valproic  acid (DEPAKENE ) 250 MG capsule Take 2 capsules by mouth 2 (two) times daily. 03/04/24   [provider]  lisinopril -hydrochlorothiazide  (ZESTORETIC ) 10-12.5 MG tablet Take 2 tablets by mouth daily. To lower blood pressure Patient not taking: Reported on 12/05/2020 08/12/20 12/08/20  Lorren Greig PARAS, NP    Allergies: Dilantin  [phenytoin ], Aspirin, Porcine (pork) protein-containing drug products, and Tramadol    Review of Systems  Constitutional:  Negative for fever.  Eyes:  Negative for visual disturbance.  Cardiovascular:  Negative for chest pain.  Gastrointestinal:  Positive for nausea.  Neurological:  Positive for headaches. Negative for weakness.    Updated Vital Signs BP 128/79   Pulse 100  Temp 98.7 F (37.1 C) (Oral)   Resp 14   LMP 03/08/2016   SpO2 97%   Physical Exam Vitals and nursing note reviewed.  Constitutional:      General: She is not in acute distress.    Appearance: She is well-developed. She is not ill-appearing or diaphoretic.  HENT:     Head: Normocephalic and atraumatic.     Comments: No pain over either temple Eyes:     Extraocular Movements: Extraocular movements intact.     Pupils: Pupils are equal, round, and reactive to light.  Cardiovascular:     Rate and Rhythm: Normal rate. Rhythm irregular.     Heart sounds: Normal heart sounds.  Pulmonary:     Effort:  Pulmonary effort is normal.     Breath sounds: Normal breath sounds.  Abdominal:     General: There is no distension.  Musculoskeletal:     Cervical back: Normal range of motion. No rigidity.  Skin:    General: Skin is warm and dry.  Neurological:     Mental Status: She is alert.     Comments: CN 3-12 grossly intact. 5/5 strength in all 4 extremities. Grossly normal sensation. Normal finger to nose.      (all labs ordered are listed, but only abnormal results are displayed) Labs Reviewed  COMPREHENSIVE METABOLIC PANEL WITH GFR - Abnormal; Notable for the following components:      Result Value   Glucose, Bld 111 (*)    All other components within normal limits  CBC WITH DIFFERENTIAL/PLATELET - Abnormal; Notable for the following components:   WBC 11.4 (*)    Neutro Abs 10.2 (*)    All other components within normal limits  MAGNESIUM  SEDIMENTATION RATE  C-REACTIVE PROTEIN  URINALYSIS, ROUTINE W REFLEX MICROSCOPIC  VALPROIC  ACID LEVEL  LEVETIRACETAM  LEVEL    EKG: EKG Interpretation Date/Time:  Monday September 08 2024 18:49:21 EDT Ventricular Rate:  71 PR Interval:  173 QRS Duration:  99 QT Interval:  385 QTC Calculation: 419 R Axis:   95  Text Interpretation: Sinus rhythm Multiple premature complexes, vent & supraven Consider left atrial enlargement Low voltage with right axis deviation Abnormal R-wave progression, late transition Confirmed by Freddi Hamilton [phone]) on 09/08/2024 7:23:31 PM  Radiology: CT Head Wo Contrast Result Date: 09/08/2024 EXAM: CT HEAD WITHOUT CONTRAST 09/08/2024 08:11:28 PM TECHNIQUE: CT of the head was performed without the administration of intravenous contrast. Automated exposure control, iterative reconstruction, and/or weight based adjustment of the mA/kV was utilized to reduce the radiation dose to as low as reasonably achievable. COMPARISON: None available. CLINICAL HISTORY: Right sided headache. Patient states woke up this am feeling bad.  Patient brought in by EMS from home and lives with family. Complains of headache and malaise. FINDINGS: BRAIN AND VENTRICLES: No acute hemorrhage. No evidence of acute infarct. No hydrocephalus. No extra-axial collection. No mass effect or midline shift. ORBITS: No acute abnormality. SINUSES: No acute abnormality. SOFT TISSUES AND SKULL: No acute soft tissue abnormality. No skull fracture. IMPRESSION: 1. No acute intracranial abnormality. Electronically signed by: Morgane Naveau MD 09/08/2024 08:19 PM EDT RP Workstation: [id number]     .Critical Care  Performed by: Freddi Hamilton, MD Authorized by: Freddi Hamilton, MD   Critical care provider statement:    Critical care time (minutes):  30   Critical care time was exclusive of:  Separately billable procedures and treating other patients   Critical care was necessary to treat or prevent imminent or life-threatening deterioration  of the following conditions:  CNS failure or compromise   Critical care was time spent personally by me on the following activities:  Development of treatment plan with patient or surrogate, discussions with consultants, evaluation of patient's response to treatment, examination of patient, ordering and review of laboratory studies, ordering and review of radiographic studies, ordering and performing treatments and interventions, pulse oximetry, re-evaluation of patient's condition and review of old charts    Medications Ordered in the ED  topiramate  (TOPAMAX ) tablet 200 mg (has no administration in time range)  clonazePAM  (KLONOPIN ) tablet 0.5 mg (has no administration in time range)  lactated ringers  bolus 1,000 mL (0 mLs Intravenous Stopped 09/08/24 2155)  metoCLOPramide  (REGLAN ) injection 10 mg (10 mg Intravenous Given 09/08/24 1943)  diphenhydrAMINE  (BENADRYL ) injection 25 mg (25 mg Intravenous Given 09/08/24 1942)  ketorolac  (TORADOL ) 15 MG/ML injection 15 mg (15 mg Intravenous Given 09/08/24 2129)  levETIRAcetam   (KEPPRA ) tablet 1,500 mg (1,500 mg Oral Given 09/08/24 2216)  valproic  acid (DEPAKENE ) 250 MG capsule 500 mg (500 mg Oral Given 09/08/24 2215)  LORazepam  (ATIVAN ) injection 2 mg (2 mg Intravenous Given 09/08/24 2312)                                    Medical Decision Making Amount and/or Complexity of Data Reviewed Labs: ordered.    Details: Mild leukocytosis Radiology: ordered and independent interpretation performed.    Details: No head bleed ECG/medicine tests: ordered and independent interpretation performed.    Details: No ischemia  Risk Prescription drug management.   Patient presents with nonspecific headache and overall not feeling well.  Overall poor historian.  However I was able to talk to her daughter over the phone and it seems like she actually had a seizure and that is why she is acting a little odd currently.  This is normal behavior for her.  Head CT and neuroexam are overall unremarkable otherwise.  However while I was in another room doing a procedure she apparently started having staring off spell and later developed a seizure.  Was given 2 mg IV Ativan .  She is now postictal.  Discussed with Dr. Voncile of neurology, he recommends seeing how she does after the seizure and to give her the rest of her home meds (she has already been given the Keppra  and valproic  acid but he has to be given the Klonopin  and Topamax  as well).  If she goes back to baseline, can go home and follow-up with neuro.  If not, will need admission to [facility].  Care transferred to Dr. Theadore.     Final diagnoses:  None    ED Discharge Orders     None          Freddi Hamilton, MD 09/08/24 2326

## 2024-09-08 NOTE — ED Triage Notes (Signed)
 Patient states woke up this am feeling bad.  Patient brought in by EMS from home and lives with family.  C/O headache/malaise

## 2024-09-09 ENCOUNTER — Ambulatory Visit: Admitting: Internal Medicine

## 2024-09-09 ENCOUNTER — Observation Stay (HOSPITAL_COMMUNITY): Payer: MEDICAID

## 2024-09-09 ENCOUNTER — Encounter (HOSPITAL_COMMUNITY): Payer: Self-pay | Admitting: Internal Medicine

## 2024-09-09 DIAGNOSIS — R5383 Other fatigue: Secondary | ICD-10-CM | POA: Diagnosis not present

## 2024-09-09 DIAGNOSIS — G40909 Epilepsy, unspecified, not intractable, without status epilepticus: Secondary | ICD-10-CM

## 2024-09-09 DIAGNOSIS — R569 Unspecified convulsions: Secondary | ICD-10-CM | POA: Diagnosis not present

## 2024-09-09 DIAGNOSIS — I10 Essential (primary) hypertension: Secondary | ICD-10-CM | POA: Diagnosis not present

## 2024-09-09 DIAGNOSIS — G40901 Epilepsy, unspecified, not intractable, with status epilepticus: Secondary | ICD-10-CM | POA: Diagnosis present

## 2024-09-09 DIAGNOSIS — Z7401 Bed confinement status: Secondary | ICD-10-CM | POA: Diagnosis not present

## 2024-09-09 LAB — URINALYSIS, ROUTINE W REFLEX MICROSCOPIC
Bilirubin Urine: NEGATIVE
Glucose, UA: NEGATIVE mg/dL
Hgb urine dipstick: NEGATIVE
Ketones, ur: NEGATIVE mg/dL
Leukocytes,Ua: NEGATIVE
Nitrite: NEGATIVE
Protein, ur: 100 mg/dL — AB
Specific Gravity, Urine: 1.014 (ref 1.005–1.030)
pH: 5 (ref 5.0–8.0)

## 2024-09-09 LAB — BASIC METABOLIC PANEL WITH GFR
Anion gap: 12 (ref 5–15)
BUN: 13 mg/dL (ref 6–20)
CO2: 21 mmol/L — ABNORMAL LOW (ref 22–32)
Calcium: 9.2 mg/dL (ref 8.9–10.3)
Chloride: 105 mmol/L (ref 98–111)
Creatinine, Ser: 0.75 mg/dL (ref 0.44–1.00)
GFR, Estimated: >60 mL/min (ref >60)
Glucose, Bld: 70 mg/dL (ref 70–99)
Potassium: 3.9 mmol/L (ref 3.5–5.1)
Sodium: 138 mmol/L (ref 135–145)

## 2024-09-09 LAB — CBC WITH DIFFERENTIAL/PLATELET
Abs Immature Granulocytes: 0.03 K/uL (ref 0.00–0.07)
Basophils Absolute: 0 K/uL (ref 0.0–0.1)
Basophils Relative: 0 %
Eosinophils Absolute: 0 K/uL (ref 0.0–0.5)
Eosinophils Relative: 0 %
HCT: 38.9 % (ref 36.0–46.0)
Hemoglobin: 12.5 g/dL (ref 12.0–15.0)
Immature Granulocytes: 0 %
Lymphocytes Relative: 23 %
Lymphs Abs: 2.3 K/uL (ref 0.7–4.0)
MCH: 27.5 pg (ref 26.0–34.0)
MCHC: 32.1 g/dL (ref 30.0–36.0)
MCV: 85.5 fL (ref 80.0–100.0)
Monocytes Absolute: 0.7 K/uL (ref 0.1–1.0)
Monocytes Relative: 7 %
Neutro Abs: 7 K/uL (ref 1.7–7.7)
Neutrophils Relative %: 70 %
Platelets: 244 K/uL (ref 150–400)
RBC: 4.55 MIL/uL (ref 3.87–5.11)
RDW: 14.5 % (ref 11.5–15.5)
WBC: 10.1 K/uL (ref 4.0–10.5)
nRBC: 0 % (ref 0.0–0.2)

## 2024-09-09 LAB — C-REACTIVE PROTEIN: CRP: <0.5 mg/dL (ref <1.0)

## 2024-09-09 MED ORDER — CLONAZEPAM 0.5 MG PO TABS
0.5000 mg | ORAL_TABLET | Freq: Two times a day (BID) | ORAL | Status: DC
  Administered 2024-09-09 – 2024-09-11 (×5): 0.5 mg via ORAL
  Filled 2024-09-09 (×5): qty 1

## 2024-09-09 MED ORDER — ACETAMINOPHEN 325 MG PO TABS: 650.0000 mg | ORAL_TABLET | Freq: Four times a day (QID) | ORAL | Status: DC | PRN

## 2024-09-09 MED ORDER — TOPIRAMATE 100 MG PO TABS
200.0000 mg | ORAL_TABLET | Freq: Two times a day (BID) | ORAL | Status: DC
  Administered 2024-09-09 – 2024-09-10 (×3): 200 mg via ORAL
  Filled 2024-09-09 (×3): qty 2

## 2024-09-09 MED ORDER — ACETAMINOPHEN 650 MG RE SUPP: 650.0000 mg | Freq: Four times a day (QID) | RECTAL | Status: DC | PRN

## 2024-09-09 MED ORDER — VALPROATE SODIUM 100 MG/ML IV SOLN
1200.0000 mg | Freq: Once | INTRAVENOUS | Status: AC
  Administered 2024-09-09: 1200 mg via INTRAVENOUS
  Filled 2024-09-09: qty 12

## 2024-09-09 MED ORDER — LEVETIRACETAM (KEPPRA) 500 MG/5 ML ADULT IV PUSH
1500.0000 mg | Freq: Two times a day (BID) | INTRAVENOUS | Status: DC
  Administered 2024-09-09 – 2024-09-10 (×3): 1500 mg via INTRAVENOUS
  Filled 2024-09-09 (×3): qty 15

## 2024-09-09 MED ORDER — SODIUM CHLORIDE 0.9 % IV SOLN: INTRAVENOUS | Status: AC

## 2024-09-09 MED ORDER — VALPROATE SODIUM 100 MG/ML IV SOLN: 500.0000 mg | Freq: Two times a day (BID) | INTRAVENOUS | Status: DC

## 2024-09-09 MED ORDER — ALBUTEROL SULFATE (2.5 MG/3ML) 0.083% IN NEBU: 2.5000 mg | INHALATION_SOLUTION | Freq: Four times a day (QID) | RESPIRATORY_TRACT | Status: DC | PRN

## 2024-09-09 MED ORDER — LORAZEPAM 2 MG/ML IJ SOLN: 2.0000 mg | INTRAMUSCULAR | Status: DC | PRN

## 2024-09-09 MED ORDER — VALPROATE SODIUM 100 MG/ML IV SOLN
500.0000 mg | Freq: Two times a day (BID) | INTRAVENOUS | Status: DC
  Administered 2024-09-09 – 2024-09-10 (×2): 500 mg via INTRAVENOUS
  Filled 2024-09-09 (×5): qty 5

## 2024-09-09 NOTE — ED Notes (Signed)
 Answers some questions appropriately sometimes. Inconsistent on exam. Responses scattered, poor concentration. Follows most commands inconsistently, easily confused, and needs much prompting. Alert, NAD, calm, interactive, skin W&D, resps e/u, speaking in clear complete sentences. VSS. No sz activity. Sz precautions remain. Bed assigned, pending cleaning.

## 2024-09-09 NOTE — ED Notes (Signed)
 Assisted pt with bed pan. All needs met.

## 2024-09-09 NOTE — Progress Notes (Signed)
 Admission Nurse Note: Present at the bedside with NT/primary RN to settle the patient after transfer. Patient remains hyper, easily distracted, infantile, unable to follow commands. Patient informed she remains high fall risk, best to utilize purewic or beside commode to reduce risk for fall occurrence. Telemonitor applied, CCMD called for admission.   Called patient son, Tammy [name] for admission details. States patient lives alone at home without supervision with frequent son/dtr surveillance. States they manage her prescriptions/meal preparation although the patient may remain incompliant with med treatment - BP meds due to complaint of heachache or seizure meds d/t poor sleep. Son states the patient had been getting poor sleep the past several days playing games late into the night on her phone, concerned poor sleep, staying up late, and games may have stressed/triggered her brain to cause the seizures missing meds. Informed of plan to complete EEG, +UA. Primary RN informed. Family will be present at bedside later tonight.

## 2024-09-09 NOTE — H&P (Signed)
 History and Physical    Tammy [name]:[id number] DOB: 04/19/1966 DOA: 09/08/2024  PCP: Vicci Barnie NOVAK, MD  Patient coming from: Home  Chief Complaint: Seizure  HPI: Tammy [name] is a 58 y.o. female with medical history significant of left temporal lobe epilepsy, migraine, hypertension, asthma, GERD, marijuana abuse presented to the ED for evaluation of headache and seizure at home.  Afebrile.  Labs notable for WBC count 11.4, glucose 111, ESR normal, CRP pending.  CT head showing no acute intracranial abnormality. She was treated for her headache with IV Benadryl , IV Toradol , IV Reglan , and was given 1 L IV fluids.  While in the ED, patient had an episode of staring spell followed by whole body seizure requiring IV Ativan  2 mg.  She was postictal and was observed for a little while but continued to remain altered.  Neurology consulted.  Patient is supposed to be on Depakote , Klonopin , Topamax , and Keppra .  Her Depakote  level was undetectable.  Keppra  level pending.  She was given Keppra  and IV Depakote  load.  Neurology felt that patient could either be in a postictal state after seizure or still having nonconvulsive seizures.  Recommended admission for further evaluation and treatment.  [facility] called to admit.  Patient is currently somnolent/postictal.  She is not able to give any history.  She does open her eyes on command and moves all extremities.  Review of Systems:  Review of Systems  All other systems reviewed and are negative.   Past Medical History:  Diagnosis Date   Asthma    Blood transfusion without reported diagnosis    'a long time ago   GERD (gastroesophageal reflux disease)    Headache(784.0)    Hypertension    Seizures (HCC)     Past Surgical History:  Procedure Laterality Date   HERNIA REPAIR     UTERINE FIBROID SURGERY       reports that she has never smoked. She has never used smokeless tobacco. She reports current drug use. Drug: Marijuana. She  reports that she does not drink alcohol.  Allergies  Allergen Reactions   Dilantin  [Phenytoin ] Swelling   Aspirin Other (See Comments)    Patient unsure of reaction    Porcine (Pork) Protein-Containing Drug Products Other (See Comments)   Tramadol Itching and Other (See Comments)    Pt states she ins unsure of reaction Pt states she ins unsure of reaction Pt states she ins unsure of reaction Pt states she ins unsure of reaction    Family History  Problem Relation Age of Onset   Heart disease Mother    Migraines Mother    Heart attack Father    Colon cancer Neg Hx     Prior to Admission medications   Medication Sig Start Date End Date Taking? Authorizing Provider  acetaminophen  (TYLENOL ) 500 MG tablet Take 1 tablet (500 mg total) by mouth every 8 (eight) hours as needed. 04/09/23   Vicci Barnie NOVAK, MD  albuterol  (VENTOLIN  HFA) 108 (90 Base) MCG/ACT inhaler Inhale 2 puffs into the lungs every 6 (six) hours as needed for wheezing or shortness of breath. 04/09/23   Vicci Barnie NOVAK, MD  amLODipine  (NORVASC ) 5 MG tablet Take 1 tablet (5 mg total) by mouth daily. 03/28/24   Vicci Barnie NOVAK, MD  clonazePAM  (KLONOPIN ) 0.5 MG tablet Take 1 tablet (0.5 mg total) by mouth 2 (two) times daily. 02/06/24   Lafe Domino, DO  cyclobenzaprine  (FLEXERIL ) 10 MG tablet Take 1 tablet (10 mg  total) by mouth 2 (two) times daily as needed for muscle spasms. 03/11/24   Robinson, John K, PA-C  diazepam  (VALIUM ) 10 MG tablet Take 10 mg by mouth. 02/22/24   [provider]  diazePAM , 15 MG Dose, (VALTOCO  15 MG DOSE) 2 x 7.5 MG/0.1ML LQPK Place 15 mg into the nose as needed (seizure lasting over 2 minutes). 02/06/24   Lafe Domino, DO  levETIRAcetam  (KEPPRA ) 750 MG tablet Take 2 tablets (1,500 mg total) by mouth 2 (two) times daily. 03/28/24   Vicci Barnie NOVAK, MD  lidocaine  (LIDODERM ) 5 % Place 1 patch onto the skin daily. Remove & Discard patch within 12 hours or as directed by MD 03/11/24    Lang Norleen POUR, PA-C  losartan  (COZAAR ) 100 MG tablet Take 1 tablet (100 mg total) by mouth daily. 03/28/24   Vicci Barnie NOVAK, MD  Multiple Vitamins-Minerals (MULTI-VITAMIN GUMMIES PO) Take 1 tablet by mouth daily.    [provider]  naproxen  (NAPROSYN ) 500 MG tablet Take 1 tablet (500 mg total) by mouth 2 (two) times daily. 06/22/24   Ula Prentice SAUNDERS, MD  topiramate  (TOPAMAX ) 200 MG tablet Take 1 tablet (200 mg total) by mouth 2 (two) times daily. 12/12/23   Georjean Darice HERO, MD  valproic  acid (DEPAKENE ) 250 MG capsule Take 2 capsules by mouth 2 (two) times daily. 03/04/24   [provider]  lisinopril -hydrochlorothiazide  (ZESTORETIC ) 10-12.5 MG tablet Take 2 tablets by mouth daily. To lower blood pressure Patient not taking: Reported on 12/05/2020 08/12/20 12/08/20  Lorren Greig PARAS, NP    Physical Exam: Vitals:   09/08/24 1831 09/08/24 2225 09/08/24 2225 09/09/24 0115  BP:  128/79  132/88  Pulse:  100  72  Resp:  14  19  Temp: 97.8 F (36.6 C)  98.7 F (37.1 C) 98.9 F (37.2 C)  TempSrc: Oral  Oral   SpO2:  97%  100%    Physical Exam Vitals reviewed.  Constitutional:      General: She is not in acute distress. HENT:     Head: Normocephalic and atraumatic.  Cardiovascular:     Rate and Rhythm: Normal rate and regular rhythm.     Heart sounds: Normal heart sounds.  Pulmonary:     Effort: Pulmonary effort is normal. No respiratory distress.     Breath sounds: Normal breath sounds.  Abdominal:     General: Bowel sounds are normal.     Palpations: Abdomen is soft.     Tenderness: There is no abdominal tenderness. There is no guarding.  Musculoskeletal:     Cervical back: Normal range of motion.     Right lower leg: No edema.     Left lower leg: No edema.  Skin:    General: Skin is warm and dry.  Neurological:     General: No focal deficit present.     Labs on Admission: I have personally reviewed following labs and imaging studies  CBC: Recent Labs  Lab  09/08/24 1831  WBC 11.4*  NEUTROABS 10.2*  HGB 13.2  HCT 42.9  MCV 86.5  PLT 286   Basic Metabolic Panel: Recent Labs  Lab 09/08/24 1831  NA 139  K 4.0  CL 102  CO2 27  GLUCOSE 111*  BUN 13  CREATININE 0.87  CALCIUM 10.2  MG 1.9   GFR: CrCl cannot be calculated (Unknown ideal weight.). Liver Function Tests: Recent Labs  Lab 09/08/24 1831  AST 22  ALT 7  ALKPHOS 79  BILITOT  0.4  PROT 8.1  ALBUMIN 4.5   No results for input(s): LIPASE, AMYLASE in the last 168 hours. No results for input(s): AMMONIA in the last 168 hours. Coagulation Profile: No results for input(s): INR, PROTIME in the last 168 hours. Cardiac Enzymes: No results for input(s): CKTOTAL, CKMB, CKMBINDEX, TROPONINI in the last 168 hours. BNP (last 3 results) No results for input(s): PROBNP in the last 8760 hours. HbA1C: No results for input(s): HGBA1C in the last 72 hours. CBG: No results for input(s): GLUCAP in the last 168 hours. Lipid Profile: No results for input(s): CHOL, HDL, LDLCALC, TRIG, CHOLHDL, LDLDIRECT in the last 72 hours. Thyroid  Function Tests: No results for input(s): TSH, T4TOTAL, FREET4, T3FREE, THYROIDAB in the last 72 hours. Anemia Panel: No results for input(s): VITAMINB12, FOLATE, FERRITIN, TIBC, IRON, RETICCTPCT in the last 72 hours. Urine analysis:    Component Value Date/Time   COLORURINE STRAW (A) 06/22/2024 0901   APPEARANCEUR CLEAR 06/22/2024 0901   LABSPEC 1.009 06/22/2024 0901   PHURINE 7.0 06/22/2024 0901   GLUCOSEU NEGATIVE 06/22/2024 0901   HGBUR NEGATIVE 06/22/2024 0901   BILIRUBINUR NEGATIVE 06/22/2024 0901   KETONESUR NEGATIVE 06/22/2024 0901   PROTEINUR NEGATIVE 06/22/2024 0901   UROBILINOGEN 1.0 02/05/2014 1852   NITRITE NEGATIVE 06/22/2024 0901   LEUKOCYTESUR NEGATIVE 06/22/2024 0901    Radiological Exams on Admission: CT Head Wo Contrast Result Date: 09/08/2024 EXAM: CT HEAD WITHOUT  CONTRAST 09/08/2024 08:11:28 PM TECHNIQUE: CT of the head was performed without the administration of intravenous contrast. Automated exposure control, iterative reconstruction, and/or weight based adjustment of the mA/kV was utilized to reduce the radiation dose to as low as reasonably achievable. COMPARISON: None available. CLINICAL HISTORY: Right sided headache. Patient states woke up this am feeling bad. Patient brought in by EMS from home and lives with family. Complains of headache and malaise. FINDINGS: BRAIN AND VENTRICLES: No acute hemorrhage. No evidence of acute infarct. No hydrocephalus. No extra-axial collection. No mass effect or midline shift. ORBITS: No acute abnormality. SINUSES: No acute abnormality. SOFT TISSUES AND SKULL: No acute soft tissue abnormality. No skull fracture. IMPRESSION: 1. No acute intracranial abnormality. Electronically signed by: Morgane Naveau MD 09/08/2024 08:19 PM EDT RP Workstation: [id number]    EKG: Independently reviewed.  Sinus rhythm, PVCs, PACs.  Assessment and Plan  Breakthrough seizures CT head showing no acute intracranial abnormality.  Patient is supposed to be on outpatient Depakote , Klonopin , Topamax , and Keppra .  Suspect noncompliance as her Depakote  level was undetectable.  Keppra  level pending.  Neurology felt that patient could either be in a postictal state after seizure or still having nonconvulsive seizures. -Appreciate neurology recommendations -Admit to [facility] for LTM EEG -Frequent rechecks -Seizure precautions -Telemetry -Continue Depakote  500 mg twice daily IV for now -Continue Keppra  1500 mg twice daily IV for now -Topamax  200 mg twice daily and Klonopin  0.5 mg twice daily if able to take p.o. -May need NG tube if unable to take p.o. for extended period of time -IV Ativan /Versed for seizure lasting more than 5 minutes -IV fluid hydration -Check UA and chest x-ray for any evidence of infections causing lowered seizure  threshold.  Hypertension: Currently normotensive. Asthma: Stable, no signs of acute exacerbation.  Albuterol  PRN. GERD Patient is currently somnolent/postictal and not able to verify her home medications.  DVT prophylaxis: SCDs Code Status: Full code by default.  Patient currently does not have capacity for decision-making, no surrogate or prior directive available. Family Communication: No family available at  this time. Level of care: Progressive Care Unit Admission status: It is my clinical opinion that referral for OBSERVATION is reasonable and necessary in this patient based on the above information provided. The aforementioned taken together are felt to place the patient at high risk for further clinical deterioration. However, it is anticipated that the patient may be medically stable for discharge from the hospital within 24 to 48 hours.  Editha Ram MD [facility]  If 7PM-7AM, please contact night-coverage [email or web address]  09/09/2024, 3:54 AM

## 2024-09-09 NOTE — Progress Notes (Signed)
 LTM EEG hooked up and running - no initial skin breakdown - push button tested - [facility] monitoring.

## 2024-09-09 NOTE — Progress Notes (Signed)
 This is a 58 year old female with multiple comorbidities as mentioned in H&P was admitted early morning due to postictal status secondary to seizures and possibility of status epilepticus.  I have reviewed H&P, ED course, vitals and labs thoroughly.  Patient seen and examined in the ED.  Patient is now fully alert and oriented.  She has no complaints at all.  No focal deficit.  Patient is waiting to be transferred to [facility] for LTM EEG and further in person evaluation by neurology.  Total time spent 33 minutes

## 2024-09-09 NOTE — ED Notes (Addendum)
 Out with carelink at this time. Alert, NAD, calm, confused, redirectable, scattered. Follows commands. VSS.

## 2024-09-09 NOTE — Consult Note (Signed)
 NEUROLOGY CONSULT NOTE   Date of service: September 09, 2024 Patient Name: Tammy [name] MRN:  [id number] DOB:  November 08, 1966 Chief Complaint: Headache, altered mental status, concern for seizures Requesting Provider: Theadore Ozell HERO, MD  History of Present Illness  Tammy [name] is a 58 y.o. female with hx of left temporal lobe epilepsy, migraine, hypertension, presenting for evaluation of headache that has now been going on for 2 days.  When she came to the emergency department, she reported that this is different than her usual migraine headaches.  She reported headache being worse today than before and I had some tingling sensation to her head.  She also had nausea and vomiting.  Headache was reported to be 8 out of 10. She was being treated for her headache and her while in the emergency department, had an episode of staring spell followed by whole body seizure requiring Ativan .  She was then postictal, and was observed for a little while but continued to remain altered.  Case was discussed with me initially and then after the seizure, I recommend admission for monitoring as well as EEG. No family at bedside. Patient was supposed to be on the following antiseizure medications: - Depakote  500 twice daily - Klonopin  0.5 mg twice daily - Topamax  200 mg twice daily - Keppra  1500 mg twice daily Her Depakote  level was undetectable.  No family member at bedside to provide information on compliance.  In the emergency department, She was given IV Ativan  2 mg x 1, IV Keppra  and I recommended IV Depakote  load 20 mg/kg. P.o. meds were held due to her mental status.   ROS  Unable to ascertain due to her altered mentation  Past History   Past Medical History:  Diagnosis Date   Asthma    Blood transfusion without reported diagnosis    'a long time ago   GERD (gastroesophageal reflux disease)    Headache(784.0)    Hypertension    Seizures (HCC)     Past Surgical History:  Procedure  Laterality Date   HERNIA REPAIR     UTERINE FIBROID SURGERY      Family History: Family History  Problem Relation Age of Onset   Heart disease Mother    Migraines Mother    Heart attack Father    Colon cancer Neg Hx     Social History  reports that she has never smoked. She has never used smokeless tobacco. She reports current drug use. Drug: Marijuana. She reports that she does not drink alcohol.  Allergies  Allergen Reactions   Dilantin  [Phenytoin ] Swelling   Aspirin Other (See Comments)    Patient unsure of reaction    Porcine (Pork) Protein-Containing Drug Products Other (See Comments)   Tramadol Itching and Other (See Comments)    Pt states she ins unsure of reaction Pt states she ins unsure of reaction Pt states she ins unsure of reaction Pt states she ins unsure of reaction    Medications   Current Facility-Administered Medications:    clonazePAM  (KLONOPIN ) tablet 0.5 mg, 0.5 mg, Oral, Once, Freddi Hamilton, MD   topiramate  (TOPAMAX ) tablet 200 mg, 200 mg, Oral, Once, Freddi Hamilton, MD  Current Outpatient Medications:    acetaminophen  (TYLENOL ) 500 MG tablet, Take 1 tablet (500 mg total) by mouth every 8 (eight) hours as needed., Disp: 60 tablet, Rfl: 1   albuterol  (VENTOLIN  HFA) 108 (90 Base) MCG/ACT inhaler, Inhale 2 puffs into the lungs every 6 (six) hours as needed for wheezing  or shortness of breath., Disp: 18 g, Rfl: 2   amLODipine  (NORVASC ) 5 MG tablet, Take 1 tablet (5 mg total) by mouth daily., Disp: 90 tablet, Rfl: 1   clonazePAM  (KLONOPIN ) 0.5 MG tablet, Take 1 tablet (0.5 mg total) by mouth 2 (two) times daily., Disp: 30 tablet, Rfl: 0   cyclobenzaprine  (FLEXERIL ) 10 MG tablet, Take 1 tablet (10 mg total) by mouth 2 (two) times daily as needed for muscle spasms., Disp: 20 tablet, Rfl: 0   diazepam  (VALIUM ) 10 MG tablet, Take 10 mg by mouth., Disp: , Rfl:    diazePAM , 15 MG Dose, (VALTOCO  15 MG DOSE) 2 x 7.5 MG/0.1ML LQPK, Place 15 mg into the nose as  needed (seizure lasting over 2 minutes)., Disp: 10 each, Rfl: 2   levETIRAcetam  (KEPPRA ) 750 MG tablet, Take 2 tablets (1,500 mg total) by mouth 2 (two) times daily., Disp: 120 tablet, Rfl: 4   lidocaine  (LIDODERM ) 5 %, Place 1 patch onto the skin daily. Remove & Discard patch within 12 hours or as directed by MD, Disp: 30 patch, Rfl: 0   losartan  (COZAAR ) 100 MG tablet, Take 1 tablet (100 mg total) by mouth daily., Disp: 90 tablet, Rfl: 1   Multiple Vitamins-Minerals (MULTI-VITAMIN GUMMIES PO), Take 1 tablet by mouth daily., Disp: , Rfl:    naproxen  (NAPROSYN ) 500 MG tablet, Take 1 tablet (500 mg total) by mouth 2 (two) times daily., Disp: 30 tablet, Rfl: 0   topiramate  (TOPAMAX ) 200 MG tablet, Take 1 tablet (200 mg total) by mouth 2 (two) times daily., Disp: 180 tablet, Rfl: 3   valproic  acid (DEPAKENE ) 250 MG capsule, Take 2 capsules by mouth 2 (two) times daily., Disp: , Rfl:   Vitals   Vitals:   2024-10-08 1831 10-08-2024 2225 08-Oct-2024 2225 09/09/24 0115  BP:  128/79  132/88  Pulse:  100  72  Resp:  14  19  Temp: 97.8 F (36.6 C)  98.7 F (37.1 C) 98.9 F (37.2 C)  TempSrc: Oral  Oral   SpO2:  97%  100%    There is no height or weight on file to calculate BMI.   Physical Exam   General: Well-developed well-nourished woman in no acute distress, comfortably sleeping in bed, opens eyes to voice. HEENT: Normocephalic and atraumatic CVS: Regular rate rhythm Respiratory: Breathing well saturating normally on room air Abdomen nondistended nontender Neurological exam She was sleeping in bed Upon gentle stimulation, she woke up and started vocalizing incomprehensible sounds. She was unable to follow commands Cranial nerves: Pupils appear equal round reactive light, there is no gaze preference or deviation, she seems to be looking around both sides spontaneously, she blinks to threat inconsistently from both sides, face appears grossly symmetric. On motor examination, she does not follow  commands but she is moving all 4 extremities equally Sensation appears intact as evidenced by withdrawal to noxious stimulation bilaterally.  Labs/Imaging/Neurodiagnostic studies   CBC:  Recent Labs  Lab 10/08/2024 1831  WBC 11.4*  NEUTROABS 10.2*  HGB 13.2  HCT 42.9  MCV 86.5  PLT 286   Basic Metabolic Panel:  Lab Results  Component Value Date   NA 139 2024/10/08   K 4.0 10/08/24   CO2 27 10-08-24   GLUCOSE 111 (H) Oct 08, 2024   BUN 13 October 08, 2024   CREATININE 0.87 2024/10/08   CALCIUM 10.2 10/08/2024   GFRNONAA >60 October 08, 2024   GFRAA 108 12/22/2020   Lipid Panel:  Lab Results  Component Value Date   LDLCALC  120 (H) 03/28/2024   HgbA1c:  Lab Results  Component Value Date   HGBA1C 4.7 (L) 02/01/2024   Urine Drug Screen:     Component Value Date/Time   LABOPIA NONE DETECTED 05/02/2024 2345   COCAINSCRNUR NONE DETECTED 05/02/2024 2345   LABBENZ POSITIVE (A) 05/02/2024 2345   AMPHETMU NONE DETECTED 05/02/2024 2345   THCU POSITIVE (A) 05/02/2024 2345   LABBARB NONE DETECTED 05/02/2024 2345    Alcohol Level     Component Value Date/Time   ETH <5 06/28/2016 0758   INR  Lab Results  Component Value Date   INR 2.7 12/30/2007    VPA level <10  CT Head without contrast(Personally reviewed): No acute process  ASSESSMENT   [place] C Peppel is a 58 y.o. female past history of temporal lobe epilepsy, migraines, hypertension presenting for evaluation of headache and altered mental status, who had a staring spell followed by a seizure in the emergency department at [facility].  She remains somewhat somnolent and does not appear to be back to baseline after that seizure. Her Depakote  level was undetectable She received her Keppra  IV and Depakote  load IV after the levels came back for valproate being undetectable. At this point, she could either be in a postictal state after seizure or still be having nonconvulsive seizures. She would need further  evaluation and treatment.  Impression: Breakthrough seizure, question status epilepticus  RECOMMENDATIONS  Admit to hospitalist-needs LTM EEG. LTM EEG ordered and will be hooked up when she is at [facility]. Frequent rechecks Seizure precautions Telemetry Continue Depakote  500 twice daily IV for now Continue Keppra  1500 twice daily IV for now Topamax  200 twice daily and Klonopin  0.5 twice daily if able to take p.o. May need NG tube if unable to take p.o. for extended period of time. IV Ativan /Versed for seizure lasting more than 5 minutes. Check UA and chest x-ray for any evidence of infections causing lowered seizure threshold. Neurology will follow Plan discussed with Dr. Theadore ______________________________________________________________________    Signed, Eligio Lav, MD [facility]

## 2024-09-09 NOTE — ED Notes (Signed)
 [facility] called.

## 2024-09-10 ENCOUNTER — Other Ambulatory Visit (HOSPITAL_COMMUNITY): Payer: Self-pay

## 2024-09-10 ENCOUNTER — Telehealth (HOSPITAL_COMMUNITY): Payer: Self-pay | Admitting: Pharmacy Technician

## 2024-09-10 ENCOUNTER — Observation Stay (HOSPITAL_COMMUNITY): Payer: MEDICAID

## 2024-09-10 DIAGNOSIS — G40909 Epilepsy, unspecified, not intractable, without status epilepticus: Secondary | ICD-10-CM | POA: Diagnosis not present

## 2024-09-10 DIAGNOSIS — G40101 Localization-related (focal) (partial) symptomatic epilepsy and epileptic syndromes with simple partial seizures, not intractable, with status epilepticus: Secondary | ICD-10-CM | POA: Diagnosis present

## 2024-09-10 DIAGNOSIS — Z885 Allergy status to narcotic agent status: Secondary | ICD-10-CM | POA: Diagnosis not present

## 2024-09-10 DIAGNOSIS — G40901 Epilepsy, unspecified, not intractable, with status epilepticus: Secondary | ICD-10-CM | POA: Diagnosis present

## 2024-09-10 DIAGNOSIS — J45909 Unspecified asthma, uncomplicated: Secondary | ICD-10-CM | POA: Diagnosis present

## 2024-09-10 DIAGNOSIS — G43909 Migraine, unspecified, not intractable, without status migrainosus: Secondary | ICD-10-CM | POA: Diagnosis present

## 2024-09-10 DIAGNOSIS — Z8249 Family history of ischemic heart disease and other diseases of the circulatory system: Secondary | ICD-10-CM | POA: Diagnosis not present

## 2024-09-10 DIAGNOSIS — Z886 Allergy status to analgesic agent status: Secondary | ICD-10-CM | POA: Diagnosis not present

## 2024-09-10 DIAGNOSIS — I10 Essential (primary) hypertension: Secondary | ICD-10-CM | POA: Diagnosis present

## 2024-09-10 DIAGNOSIS — R569 Unspecified convulsions: Secondary | ICD-10-CM | POA: Diagnosis not present

## 2024-09-10 LAB — LEVETIRACETAM LEVEL: Levetiracetam Lvl: <2 ug/mL — ABNORMAL LOW (ref 10.0–40.0)

## 2024-09-10 MED ORDER — DIVALPROEX SODIUM 250 MG PO DR TAB
500.0000 mg | DELAYED_RELEASE_TABLET | Freq: Two times a day (BID) | ORAL | Status: DC
  Administered 2024-09-10 – 2024-09-11 (×2): 500 mg via ORAL
  Filled 2024-09-10 (×2): qty 2

## 2024-09-10 MED ORDER — LEVETIRACETAM ER 500 MG PO TB24
3000.0000 mg | ORAL_TABLET | Freq: Every day | ORAL | Status: DC
  Filled 2024-09-10: qty 6

## 2024-09-10 MED ORDER — ZONISAMIDE 100 MG PO CAPS
300.0000 mg | ORAL_CAPSULE | Freq: Every day | ORAL | Status: DC
  Administered 2024-09-10: 300 mg via ORAL
  Filled 2024-09-10 (×2): qty 3

## 2024-09-10 MED ORDER — LEVETIRACETAM ER 500 MG PO TB24
1500.0000 mg | ORAL_TABLET | Freq: Every day | ORAL | Status: AC
  Administered 2024-09-10: 1500 mg via ORAL
  Filled 2024-09-10: qty 3

## 2024-09-10 NOTE — TOC Initial Note (Signed)
 Transition of Care [facility]) - Initial/Assessment Note    Patient Details  Name: Tammy [name] MRN: [id number] Date of Birth: 04/08/1966  Transition [facility] CM/SW Contact:    Andrez JULIANNA George, RN Phone Number: 09/10/2024, 11:05 AM  Clinical Narrative:                 Tammy [name] is a 58 y.o. female with medical history significant of left temporal lobe epilepsy, migraine, hypertension, asthma, GERD, marijuana abuse presented to the ED for evaluation of headache and seizure at home.   Pt is from home alone. She says her daughter provides needed transportation. She manages her own medications.  Awaiting therapy eval.  IP Care management following.  Expected Discharge Plan: Home/Self Care Barriers to Discharge: Continued Medical Work up   Patient Goals and CMS Choice            Expected Discharge Plan and Services   Discharge Planning Services: CM Consult   Living arrangements for the past 2 months: Apartment                                      Prior Living Arrangements/Services Living arrangements for the past 2 months: Apartment Lives with:: Self Patient language and need for interpreter reviewed:: Yes Do you feel safe going back to the place where you live?: Yes        Care giver support system in place?: No (comment) Current home services: DME (walker) Criminal Activity/Legal Involvement Pertinent to Current Situation/Hospitalization: No - Comment as needed  Activities of Daily Living   ADL Screening (condition at time of admission) Independently performs ADLs?: No Does the patient have a NEW difficulty with bathing/dressing/toileting/self-feeding that is expected to last >3 days?: Yes (Initiates electronic notice to provider for possible OT consult) Does the patient have a NEW difficulty with getting in/out of bed, walking, or climbing stairs that is expected to last >3 days?: Yes (Initiates electronic notice to provider for possible PT  consult) Does the patient have a NEW difficulty with communication that is expected to last >3 days?: No Is the patient deaf or have difficulty hearing?: No Does the patient have difficulty seeing, even when wearing glasses/contacts?: No Does the patient have difficulty concentrating, remembering, or making decisions?: Yes  Permission Sought/Granted                  Emotional Assessment Appearance:: Appears stated age Attitude/Demeanor/Rapport:  (odd affect) Affect (typically observed): Anxious Orientation: : Oriented to Self, Oriented to Place, Oriented to Situation   Psych Involvement: No (comment)  Admission diagnosis:  Status epilepticus (HCC) [G40.901] Seizures (HCC) [R56.9] Patient Active Problem List   Diagnosis Date Noted   Chronic health problem 02/04/2024   Seizures (HCC) 02/01/2024   Encephalopathy 02/01/2024   Varicose veins of leg with edema, bilateral 02/20/2022   COVID-19 virus infection 12/05/2020   Acute superficial venous thrombosis of lower extremity, left 12/13/2018   Hypertension 12/13/2018   Chronic migraine 09/07/2016   Seizure (HCC) 08/20/2016   Asthma 08/20/2016   DVT, HX OF 01/20/2008   FIBROIDS, UTERUS 01/16/2008   PCP:  Vicci Barnie NOVAK, MD Pharmacy:   [facility] [address], [address] [place] [address] Phone: [phone] Fax: [phone]  [id number] [place] - [address] [place] [address] Phone: [phone] Fax: [phone]  [facility] [place] [facility] [address] [place] [address] Phone: [phone] Fax: [phone]  [id number] - [place],  - [address] [place] [address] Phone: [phone] Fax: [phone]  [id number] - [place], [place] - [address] AT [facility] [address] [place] [address] Phone: [phone] Fax: [phone]  [facility] [address] [place] [address] Phone: [phone] Fax: [phone]     Social Drivers of Health (SDOH) Social History: SDOH Screenings   Food Insecurity: No Food Insecurity (09/09/2024)  Housing: Low Risk  (09/09/2024)  Transportation Needs: No Transportation Needs (09/09/2024)  Utilities: Not At Risk (09/09/2024)  Depression (PHQ2-9): Low Risk  (06/01/2023)  Tobacco Use: Low Risk  (09/09/2024)   SDOH Interventions:     Readmission Risk Interventions     No data to display

## 2024-09-10 NOTE — Progress Notes (Signed)
 LTM EEG D/C'd. No noted skin break down. [facility] notified.

## 2024-09-10 NOTE — Telephone Encounter (Signed)
 Patient Product/process development scientist completed.    The patient is insured through [employer] [place].     Ran test claim for levetiracetam  XR 750 mg (1500 mg bid) and the current 30 day co-pay is $4.00.   This test claim was processed through [facility]- copay amounts may vary at other pharmacies due to pharmacy/plan contracts, or as the patient moves through the different stages of their insurance plan.     Reyes Sharps, CPHT Pharmacy Technician Patient Advocate Specialist Lead [facility] Patient Advocate Team Direct Number: [phone]  Fax: [phone]

## 2024-09-10 NOTE — Progress Notes (Signed)
 Subjective: No acute events overnight.  No further seizures.  States she lives with her kids and they help her with medications.  However she is on too many medications and has not been taking them.  ROS: negative except above Examination  Vital signs in last 24 hours: Temp:  [97.9 F (36.6 C)-99 F (37.2 C)] 98.2 F (36.8 C) (10/15 1127) Pulse Rate:  [60-90] 61 (10/15 1127) Resp:  [12-27] 18 (10/15 1127) BP: (129-156)/(83-121) 153/94 (10/15 1127) SpO2:  [97 %-100 %] 100 % (10/15 1127) Weight:  [61.2 kg] 61.2 kg (10/14 1700)  General: lying in bed, NAD Psych: Had a hard effect, laughing at times then trying to cover her face Neuro: AO x 3, no aphasia, cranial nerves grossly intact, 5/5 in all extremities  Basic Metabolic Panel: Recent Labs  Lab 09/08/24 1831 09/09/24 0929  NA 139 138  K 4.0 3.9  CL 102 105  CO2 27 21*  GLUCOSE 111* 70  BUN 13 13  CREATININE 0.87 0.75  CALCIUM 10.2 9.2  MG 1.9  --     CBC: Recent Labs  Lab 09/08/24 1831 09/09/24 0929  WBC 11.4* 10.1  NEUTROABS 10.2* 7.0  HGB 13.2 12.5  HCT 42.9 38.9  MCV 86.5 85.5  PLT 286 244     Coagulation Studies: No results for input(s): LABPROT, INR in the last 72 hours.  Imaging personally reviewed  CT head without contrast 09/25/2024: No acute abnormality.   ASSESSMENT AND PLAN: 58 year old female with history of left handed Abrol epilepsy who presented with breakthrough seizures.  Unclear if patient has been noncompliant or unable to take medications due to polypharmacy.   Epilepsy with breakthrough seizure - No further seizures overnight.  DC LTM EEG - Patient states she is out of any medications and has not been taking them.  Therefore we will switch Topamax  to zonisamide 300 mg nightly -Will also check with pharmacy we can switch Keppra  1500 twice daily to extended release 3000 mg once daily to help improve compliance - Continue Depakote  and Klonopin  at current dose -Rest medication:  Already has Valtoco  - Discussed importance of medication with patient - Continue seizure precautions - Continue to follow-up with Dr. Georjean.  Of note patient has missed last 2 appointments.  Therefore it is really important she follows up during her next appointment  Seizure precautions: Per [employer]  DMV statutes, patients with seizures are not allowed to drive until they have been seizure-free for six months and cleared by a physician    Use caution when using heavy equipment or power tools. Avoid working on ladders or at heights. Take showers instead of baths. Ensure the water temperature is not too high on the home water heater. Do not go swimming alone. Do not lock yourself in a room alone (i.e. bathroom). When caring for infants or small children, sit down when holding, feeding, or changing them to minimize risk of injury to the child in the event you have a seizure. Maintain good sleep hygiene. Avoid alcohol.    If patient has another seizure, call 911 and bring them back to the ED if: A.  The seizure lasts longer than 5 minutes.      B.  The patient doesn't wake shortly after the seizure or has new problems such as difficulty seeing, speaking or moving following the seizure C.  The patient was injured during the seizure D.  The patient has a temperature over 102 F (39C) E.  The patient vomited during  the seizure and now is having trouble breathing    During the Seizure   - First, ensure adequate ventilation and place patients on the floor on their left side  Loosen clothing around the neck and ensure the airway is patent. If the patient is clenching the teeth, do not force the mouth open with any object as this can cause severe damage - Remove all items from the surrounding that can be hazardous. The patient may be oblivious to what's happening and may not even know what he or she is doing. If the patient is confused and wandering, either gently guide him/her away and block access  to outside areas - Reassure the individual and be comforting - Call 911. In most cases, the seizure ends before EMS arrives. However, there are cases when seizures may last over 3 to 5 minutes. Or the individual may have developed breathing difficulties or severe injuries. If a pregnant patient or a person with diabetes develops a seizure, it is prudent to call an ambulance.     After the Seizure (Postictal Stage)   After a seizure, most patients experience confusion, fatigue, muscle pain and/or a headache. Thus, one should permit the individual to sleep. For the next few days, reassurance is essential. Being calm and helping reorient the person is also of importance.   Most seizures are painless and end spontaneously. Seizures are not harmful to others but can lead to complications such as stress on the lungs, brain and the heart. Individuals with prior lung problems may develop labored breathing and respiratory distress.          I personally spent a total of 36 minutes in the care of the patient today including getting/reviewing separately obtained history, performing a medically appropriate exam/evaluation, counseling and educating, placing orders, referring and communicating with other health care professionals, documenting clinical information in the EHR, independently interpreting results, and coordinating care.        Arlin Krebs Epile[facility] For questions after 5pm please refer to AMION to reach the Neurologist on call

## 2024-09-10 NOTE — Evaluation (Signed)
 Occupational Therapy Evaluation Patient Details Name: Tammy [name] MRN: [id number] DOB: May 10, 1966 Today's Date: 09/10/2024   History of Present Illness   Pt is a 58 y.o. female presenting from home 10/13 with seizure and HA. CTH with no acute abnormality. LTM EEG pending. Of note, pt with prescribed Depakote  and Keppra , but no Depakote  found in blood work; Keppra  pending.     Clinical Impressions PTA, pt reports she lived with her kids (per chart, pt lives alone so unsure accuracy of report). Pt needing up to [facility] for BADL this session. Pt with more prominent deficits in cognition including problem solving, memory, sequencing. Pt to continue to benefit from skilled occupational therapy services. Recommending discharge home with more frequent supervision from children as well as assist with all IADL at this time.      If plan is discharge home, recommend the following:   A little help with walking and/or transfers;A little help with bathing/dressing/bathroom;Assistance with cooking/housework;Direct supervision/assist for medications management;Direct supervision/assist for financial management;Assist for transportation;Help with stairs or ramp for entrance     Functional Status Assessment   Patient has had a recent decline in their functional status and demonstrates the ability to make significant improvements in function in a reasonable and predictable amount of time.     Equipment Recommendations   None recommended by OT     Recommendations for Other Services         Precautions/Restrictions   Precautions Precautions: Other (comment) (seizure) Restrictions Weight Bearing Restrictions Per Provider Order: No     Mobility Bed Mobility Overal bed mobility: Modified Independent                  Transfers Overall transfer level: Modified independent                 General transfer comment: for STS      Balance                                            ADL either performed or assessed with clinical judgement   ADL Overall ADL's : Needs assistance/impaired Eating/Feeding: Set up;Sitting   Grooming: Contact guard assist;Minimal assistance;Standing;Wash/dry face;Wash/dry hands;Oral care Grooming Details (indicate cue type and reason): one LOBH toward L standing at sink with min A to correct Upper Body Bathing: Set up;Sitting   Lower Body Bathing: Contact guard assist;Sit to/from stand   Upper Body Dressing : Set up;Sitting   Lower Body Dressing: Contact guard assist;Sit to/from stand   Toilet Transfer: Contact guard assist;Ambulation           Functional mobility during ADLs: Contact guard assist       Vision Patient Visual Report: No change from baseline Vision Assessment?: No apparent visual deficits Additional Comments: able to read from phone, denies visual changes     Perception         Praxis         Pertinent Vitals/Pain Pain Assessment Pain Assessment: No/denies pain     Extremity/Trunk Assessment Upper Extremity Assessment Upper Extremity Assessment: Overall WFL for tasks assessed   Lower Extremity Assessment Lower Extremity Assessment: Defer to PT evaluation   Cervical / Trunk Assessment Cervical / Trunk Assessment: Normal   Communication Communication Communication: No apparent difficulties   Cognition Arousal: Alert Behavior During Therapy: Lability (laughing uncontrollably vs flat) Cognition: No family/caregiver present to determine baseline,  Cognition impaired   Orientation impairments: Time (day/date, needing indirect cues to recall month have you had a birthday yet this year) Awareness: Intellectual awareness impaired, Online awareness impaired Memory impairment (select all impairments): Working memory Attention impairment (select first level of impairment): Sustained attention Executive functioning impairment (select all impairments): Organization,  Sequencing, Problem solving OT - Cognition Comments: unable to perform delayed memory recall, difficulty with counting backward/basic problem solving                 Following commands: Impaired Following commands impaired: Only follows one step commands consistently, Follows multi-step commands inconsistently     Cueing  General Comments   Cueing Techniques: Verbal cues;Gestural cues      Exercises     Shoulder Instructions      Home Living Family/patient expects to be discharged to:: Private residence Living Arrangements: (P) Alone   Type of Home: Apartment Home Access: Stairs to enter Entrance Stairs-Number of Steps: 2 Entrance Stairs-Rails: None Home Layout: One level     Bathroom Shower/Tub: Chief Strategy Officer: Standard     Home Equipment: None          Prior Functioning/Environment Prior Level of Function : (P) Independent/Modified Independent;History of Falls (last six months)               ADLs Comments: daughter assists with medication management, pt does not drive    OT Problem List: Decreased activity tolerance;Impaired balance (sitting and/or standing);Decreased cognition   OT Treatment/Interventions: Self-care/ADL training;Therapeutic exercise;DME and/or AE instruction;Therapeutic activities;Balance training;Patient/family education;Cognitive remediation/compensation      OT Goals(Current goals can be found in the care plan section)   Acute Rehab OT Goals Patient Stated Goal: see daughter OT Goal Formulation: With patient Time For Goal Achievement: 09/24/24 Potential to Achieve Goals: Good   OT Frequency:  Min 2X/week    Co-evaluation              AM-PAC OT 6 Clicks Daily Activity     Outcome Measure Help from another person eating meals?: A Little Help from another person taking care of personal grooming?: A Little Help from another person toileting, which includes using toliet, bedpan, or urinal?: A  Little Help from another person bathing (including washing, rinsing, drying)?: A Little Help from another person to put on and taking off regular upper body clothing?: A Little Help from another person to put on and taking off regular lower body clothing?: A Little 6 Click Score: 18   End of Session Nurse Communication: Mobility status  Activity Tolerance: Patient tolerated treatment well Patient left: in bed;with call bell/phone within reach;with bed alarm set  OT Visit Diagnosis: Unsteadiness on feet (R26.81);Muscle weakness (generalized) (M62.81);Other symptoms and signs involving cognitive function                Time: 8846-8774 OT Time Calculation (min): 32 min Charges:  OT General Charges $OT Visit: 1 Visit OT Evaluation $OT Eval Moderate Complexity: 1 Mod OT Treatments $Self Care/Home Management : 8-22 mins  Elma JONETTA Lebron FREDERICK, OTR/L [facility] Acute Rehabilitation Office: [phone]   Elma JONETTA Lebron 09/10/2024, 1:28 PM

## 2024-09-10 NOTE — Progress Notes (Signed)
 PROGRESS NOTE  Tammy [name]  FMW:[id number] DOB: 12-May-1966 DOA: 09/08/2024 PCP: Vicci Barnie NOVAK, MD   Brief Narrative: Patient is a 58 year old female with history of left temporal lobe epilepsy, migraine, hypertension, asthma, GERD, marijuana  abuse who presented with headache, seizure at home.  On presentation ,she was hemodynamically stable.  CT head did not show any acute intracranial abnormalities.  In the ED, she had an episode of staring spell followed by whole body seizure requiring IV Ativan .  Afterwards, she became postictal.  Neurology consulted.  Patient was supposed to be on Depakote , Klonopin , Topamax , Keppra .  Her Depakote  level was undetectable.  Given Keppra , IV Depakote  load.  Suspected to have persistent nonconvulsive seizures, patient transferred to [facility] for LTM EEG.  Off EEG now.  Still slightly confused this morning.  Waiting for improvement in the encephalopathy before discharge home.  Assessment & Plan:  Principal Problem:   Seizures (HCC) Active Problems:   Asthma   Hypertension   Breakthrough seizures: CT head did not show any acute findings.  Was taking Depakote , Klonopin , Topamax , Keppra  as an outpatient.  Depakote  level was undetectable, suspect noncompliance.  She said she did not take Depakote .  Had an episode of seizure-like event in the emergency department.  Suspected to have continuous nonconvulsive seizures.  Started on LTM EEG, now off.  EEG showed focal epilepsy arising from the left anterior temporal region, no seizures.  On IV Depakote , Keppra .  Also on oral topiramate , Klonopin . neurology following.   Mild patient has improved but still still slightly confused, slightly postictal.  Will continue to monitor her mentation.  Obeys commands and ambulates well  Hypertension: Currently normotensive.  Takes amlodipine  and losartan  at home.  Will restart if she becomes hypertensive  Asthma: Stable.  No signs of exacerbation.          DVT  prophylaxis:SCDs Start: 09/09/24 9567     Code Status: Full Code  Family Communication: None at the bedside  Patient status: Observation  Patient is from : Home  Anticipated discharge to: Home  Estimated DC date: Tomorrow   Consultants: Neurology  Procedures: LTM EEG  Antimicrobials:  Anti-infectives (From admission, onward)    None       Subjective: Patient seen and examined the bedside today.  She was off LTM EEG.  She is comfortable, lying in bed.  Obeys commands, denies any complaints but still slightly confused.  Could not tell today's date.  Objective: Vitals:   09/09/24 1700 09/09/24 1933 09/09/24 2302 09/10/24 0400  BP:  (!) 155/96 (!) 149/99 (!) 138/92  Pulse:  70 70 72  Resp:  18 18 18   Temp:  98.2 F (36.8 C) 99 F (37.2 C) 98.2 F (36.8 C)  TempSrc:  Oral Oral Oral  SpO2:  97% 100% 99%  Weight: 61.2 kg     Height: 5' 2 (1.575 m)       Intake/Output Summary (Last 24 hours) at 09/10/2024 0750 Last data filed at 09/09/2024 2232 Gross per 24 hour  Intake 1285 ml  Output 850 ml  Net 435 ml   Filed Weights   09/09/24 1700  Weight: 61.2 kg    Examination:  General exam: Overall comfortable, not in distress HEENT: PERRL Respiratory system:  no wheezes or crackles  Cardiovascular system: S1 & S2 heard, RRR.  Gastrointestinal system: Abdomen is nondistended, soft and nontender. Central nervous system: Alert and awake, oriented to place only Extremities: No edema, no clubbing ,no cyanosis Skin: No rashes,  no ulcers,no icterus     Data Reviewed: I have personally reviewed following labs and imaging studies  CBC: Recent Labs  Lab 09/08/24 1831 09/09/24 0929  WBC 11.4* 10.1  NEUTROABS 10.2* 7.0  HGB 13.2 12.5  HCT 42.9 38.9  MCV 86.5 85.5  PLT 286 244   Basic Metabolic Panel: Recent Labs  Lab 09/08/24 1831 09/09/24 0929  NA 139 138  K 4.0 3.9  CL 102 105  CO2 27 21*  GLUCOSE 111* 70  BUN 13 13  CREATININE 0.87 0.75   CALCIUM 10.2 9.2  MG 1.9  --      No results found for this or any previous visit (from the past 240 hours).   Radiology Studies: DG CHEST PORT 1 VIEW Result Date: 09/09/2024 EXAM: 1 VIEW(S) XRAY OF THE CHEST 09/09/2024 05:07:00 AM COMPARISON: Portable chest 05/02/2024. CLINICAL HISTORY: [id number] Seizure (HCC) [id number]. Seizure.; Per pt chart: Patient states woke up this am feeling bad. Patient brought in by EMS from home and lives with family. C/O headache/malaise.; Best images obtainable, due to pt condition.; Unable to remove shirt, moved gems on shirt up out of lungs as much as possible. Per pt chart: Patient states woke up this am feeling bad. Patient brought in by EMS from home and lives with family. C/O headache/malaise. FINDINGS: LUNGS AND PLEURA: Central vascular prominence but no overt edema. Mild increased opacity in the lateral left lung base, which could be interval increased atelectasis or developing pneumonia. This could either be in the lower lobe or the lingula. The remainder of the lungs are clear. No pleural effusion is seen. No pneumothorax. HEART AND MEDIASTINUM: The heart is moderately enlarged. The mediastinum is normally outlined. BONES AND SOFT TISSUES: The thoracic cage is intact. Artifact from overlying wires and clothing. IMPRESSION: 1. Mild increased opacity in the lateral left lung base, which may represent atelectasis or developing pneumonia in the lower lobe or lingula. 2. Moderate cardiomegaly with central vascular prominence, without overt edema. Electronically signed by: Francis Quam MD 09/09/2024 05:25 AM EDT RP Workstation: [id number]   CT Head Wo Contrast Result Date: 09/08/2024 EXAM: CT HEAD WITHOUT CONTRAST 09/08/2024 08:11:28 PM TECHNIQUE: CT of the head was performed without the administration of intravenous contrast. Automated exposure control, iterative reconstruction, and/or weight based adjustment of the mA/kV was utilized to reduce the radiation dose to as  low as reasonably achievable. COMPARISON: None available. CLINICAL HISTORY: Right sided headache. Patient states woke up this am feeling bad. Patient brought in by EMS from home and lives with family. Complains of headache and malaise. FINDINGS: BRAIN AND VENTRICLES: No acute hemorrhage. No evidence of acute infarct. No hydrocephalus. No extra-axial collection. No mass effect or midline shift. ORBITS: No acute abnormality. SINUSES: No acute abnormality. SOFT TISSUES AND SKULL: No acute soft tissue abnormality. No skull fracture. IMPRESSION: 1. No acute intracranial abnormality. Electronically signed by: Morgane Naveau MD 09/08/2024 08:19 PM EDT RP Workstation: [id number]    Scheduled Meds:  clonazePAM   0.5 mg Oral BID   levETIRAcetam   1,500 mg Intravenous Q12H   topiramate   200 mg Oral BID   Continuous Infusions:  valproate sodium  500 mg (09/10/24 0458)     LOS: 0 days   Ivonne Mustache, MD [facility] P10/15/2025, 7:50 AM

## 2024-09-10 NOTE — Plan of Care (Signed)
  Problem: Health Behavior/Discharge Planning: Goal: Ability to manage health-related needs will improve Outcome: Progressing   Problem: Clinical Measurements: Goal: Will remain free from infection Outcome: Progressing   Problem: Activity: Goal: Risk for activity intolerance will decrease Outcome: Progressing   Problem: Coping: Goal: Level of anxiety will decrease Outcome: Progressing   Problem: Nutrition: Goal: Adequate nutrition will be maintained Outcome: Progressing   Problem: Clinical Measurements: Goal: Cardiovascular complication will be avoided Outcome: Progressing   Problem: Safety: Goal: Ability to remain free from injury will improve Outcome: Progressing   Problem: Safety: Goal: Ability to remain free from injury will improve Outcome: Progressing   Problem: Skin Integrity: Goal: Risk for impaired skin integrity will decrease Outcome: Progressing

## 2024-09-10 NOTE — Evaluation (Signed)
 Physical Therapy Evaluation Patient Details Name: Tammy [name] MRN: [id number] DOB: [birth year] Today's Date: 09/10/2024  History of Present Illness  Pt is a 58 y.o. female presenting from home 10/13 with seizure and HA. CTH with no acute abnormality. LTM EEG pending. Of note, pt with prescribed Depakote  and Keppra , but no Depakote  found in blood work; Keppra  pending.  Clinical Impression  Pt in bed upon arrival and agreeable to PT eval. Pt reported being ModI with use of RW prior to admit. Unclear if pt is a reliable historian as pt gave conflicting information between different therapists. In today's session, pt was ModI to stand with no AD and with RW. Pt was able to ambulate with supervision with preference for use of RW due to feeling more steady. Pt reported at least two falls with unknown MOI. Recommend pt has 24/7 assist available at home due to deficits in cognition and safety awareness. Recommending post-acute HHPT for home safety evaluation and to work towards independence with mobility. Pt would benefit from acute skilled PT with current functional limitations listed below (see PT Problem List). Acute PT to follow.         If plan is discharge home, recommend the following: Assist for transportation;Help with stairs or ramp for entrance;Direct supervision/assist for medications management;Direct supervision/assist for financial management   Can travel by private vehicle    Yes    Equipment Recommendations None recommended by PT     Functional Status Assessment Patient has had a recent decline in their functional status and demonstrates the ability to make significant improvements in function in a reasonable and predictable amount of time.     Precautions / Restrictions Precautions Precautions: Other (comment) Precaution/Restrictions Comments: seizure Restrictions Weight Bearing Restrictions Per Provider Order: No      Mobility  Bed Mobility Overal bed mobility:  Modified Independent   Transfers Overall transfer level: Modified independent Equipment used: None, Rolling walker (2 wheels)    Ambulation/Gait Ambulation/Gait assistance: Supervision Gait Distance (Feet): 40 Feet Assistive device: Rolling walker (2 wheels), None Gait Pattern/deviations: Step-through pattern, Decreased stride length Gait velocity: decr     General Gait Details: steady with use of RW and with no AD, pt limiting gait distance     Balance Overall balance assessment: Needs assistance, History of Falls Sitting-balance support: No upper extremity supported, Feet supported Sitting balance-Leahy Scale: Good    Standing balance support: No upper extremity supported Standing balance-Leahy Scale: Fair       Pertinent Vitals/Pain Pain Assessment Pain Assessment: No/denies pain    Home Living Family/patient expects to be discharged to:: Private residence Living Arrangements: Children Available Help at Discharge: Family;Available PRN/intermittently Type of Home: Apartment Home Access: Stairs to enter Entrance Stairs-Rails: None (stated there is a rail, however, unable to clarify what side) Entrance Stairs-Number of Steps: 2   Home Layout: One level Home Equipment: Agricultural consultant (2 wheels) Additional Comments: Unclear if home-set up is accurate, no family to confirm    Prior Function Prior Level of Function : Independent/Modified Independent;History of Falls (last six months)    Mobility Comments: ModI with RW ADLs Comments: daughter assists with medication management, pt does not drive     Extremity/Trunk Assessment   Upper Extremity Assessment Upper Extremity Assessment: Defer to OT evaluation    Lower Extremity Assessment Lower Extremity Assessment: Overall WFL for tasks assessed    Cervical / Trunk Assessment Cervical / Trunk Assessment: Normal  Communication   Communication Communication: No apparent difficulties  Cognition Arousal:  Alert Behavior During Therapy: Lability (laughing uncontrollably vs flat)   PT - Cognitive impairments: No family/caregiver present to determine baseline   PT - Cognition Comments: Had difficulty answering questions and would often repeat earlier statements Following commands: Impaired Following commands impaired: Only follows one step commands consistently, Follows multi-step commands inconsistently     Cueing Cueing Techniques: Verbal cues, Gestural cues      PT Assessment Patient needs continued PT services  PT Problem List Decreased activity tolerance;Decreased mobility;Decreased cognition;Decreased safety awareness       PT Treatment Interventions DME instruction;Gait training;Stair training;Functional mobility training;Therapeutic activities;Therapeutic exercise;Balance training;Neuromuscular re-education;Patient/family education    PT Goals (Current goals can be found in the Care Plan section)  Acute Rehab PT Goals Patient Stated Goal: to go home PT Goal Formulation: With patient Time For Goal Achievement: 09/24/24 Potential to Achieve Goals: Good    Frequency Min 2X/week        AM-PAC PT 6 Clicks Mobility  Outcome Measure Help needed turning from your back to your side while in a flat bed without using bedrails?: None Help needed moving from lying on your back to sitting on the side of a flat bed without using bedrails?: None Help needed moving to and from a bed to a chair (including a wheelchair)?: None Help needed standing up from a chair using your arms (e.g., wheelchair or bedside chair)?: None Help needed to walk in hospital room?: A Little Help needed climbing 3-5 steps with a railing? : A Little 6 Click Score: 22    End of Session   Activity Tolerance: Patient tolerated treatment well Patient left: in chair;with call bell/phone within reach;with chair alarm set Nurse Communication: Mobility status PT Visit Diagnosis: Other abnormalities of gait and  mobility (R26.89)    Time: 1242-1300 PT Time Calculation (min) (ACUTE ONLY): 18 min   Charges:   PT Evaluation $PT Eval Low Complexity: 1 Low   PT General Charges $$ ACUTE PT VISIT: 1 Visit       Kate ORN, PT, DPT Secure Chat Preferred  Rehab Office [phone]   Kate BRAVO Wendolyn 09/10/2024, 2:22 PM

## 2024-09-10 NOTE — Procedures (Addendum)
 Patient Name: Tammy [name]  MRN: [id number]  Epilepsy Attending: Arlin MALVA Krebs  Referring Physician/Provider: Voncile Isles, MD  Duration: 09/09/2024 1827 to 09/10/2024 0935  Patient history: 58 y.o. female past history of temporal lobe epilepsy, migraines, hypertension presenting for evaluation of headache and altered mental status, who had a staring spell followed by a seizure in the emergency department at [facility].  She remains somewhat somnolent and does not appear to be back to baseline after that seizure EEG to evaluate for seizure  Level of alertness: Awake, asleep  AEDs during EEG study: Clonazepam , LEV, VPA, TPM  Technical aspects: This EEG study was done with scalp electrodes positioned according to the 10-20 International system of electrode placement. Electrical activity was reviewed with band pass filter of 1-70Hz , sensitivity of 7 uV/mm, display speed of 43mm/sec with a 60Hz  notched filter applied as appropriate. EEG data were recorded continuously and digitally stored.  Video monitoring was available and reviewed as appropriate.  Description: The posterior dominant rhythm consists of 9-10 Hz activity of moderate voltage (25-35 uV) seen predominantly in posterior head regions, symmetric and reactive to eye opening and eye closing. Sleep was characterized by vertex waves, sleep spindles (12 to 14 Hz), maximal frontocentral region. Left anterior temporal polyspikes were noted.  Sharp transients were noted in right anterior temporal region.  EEG also showed intermittent generalized 3 to 5 Hz theta-delta slowing admixed with 13-15hz  beta activity. Hyperventilation and photic stimulation were not performed.     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  Additionally there is mild generalized  cerebral dysfunction (encephalopathy). No seizures were seen throughout the recording.  Beckie Viscardi O Nikesh Teschner

## 2024-09-11 ENCOUNTER — Other Ambulatory Visit (HOSPITAL_COMMUNITY): Payer: Self-pay

## 2024-09-11 DIAGNOSIS — G40909 Epilepsy, unspecified, not intractable, without status epilepticus: Secondary | ICD-10-CM | POA: Diagnosis not present

## 2024-09-11 DIAGNOSIS — R569 Unspecified convulsions: Secondary | ICD-10-CM | POA: Diagnosis not present

## 2024-09-11 MED ORDER — DIVALPROEX SODIUM 500 MG PO DR TAB
500.0000 mg | DELAYED_RELEASE_TABLET | Freq: Two times a day (BID) | ORAL | 0 refills | Status: DC
  Filled 2024-09-11: qty 60, 30d supply, fill #0

## 2024-09-11 MED ORDER — AMLODIPINE BESYLATE 5 MG PO TABS
5.0000 mg | ORAL_TABLET | Freq: Every day | ORAL | 0 refills | Status: DC
  Filled 2024-09-11: qty 30, 30d supply, fill #0

## 2024-09-11 MED ORDER — ZONISAMIDE 100 MG PO CAPS
300.0000 mg | ORAL_CAPSULE | Freq: Every day | ORAL | 0 refills | Status: DC
  Filled 2024-09-11: qty 90, 30d supply, fill #0

## 2024-09-11 MED ORDER — LOSARTAN POTASSIUM 50 MG PO TABS
100.0000 mg | ORAL_TABLET | Freq: Every day | ORAL | Status: DC
  Administered 2024-09-11: 100 mg via ORAL
  Filled 2024-09-11: qty 2

## 2024-09-11 MED ORDER — LEVETIRACETAM ER 500 MG PO TB24
3000.0000 mg | ORAL_TABLET | Freq: Every day | ORAL | 0 refills | Status: DC
  Filled 2024-09-11: qty 180, 30d supply, fill #0

## 2024-09-11 MED ORDER — LOSARTAN POTASSIUM 100 MG PO TABS
100.0000 mg | ORAL_TABLET | Freq: Every day | ORAL | 0 refills | Status: DC
  Filled 2024-09-11: qty 30, 30d supply, fill #0

## 2024-09-11 MED ORDER — CLONAZEPAM 0.5 MG PO TABS
0.5000 mg | ORAL_TABLET | Freq: Two times a day (BID) | ORAL | 0 refills | Status: DC
  Filled 2024-09-11: qty 60, 30d supply, fill #0

## 2024-09-11 NOTE — Progress Notes (Signed)
 Subjective: No acute events overnight.  No seizures.  Denies any side effects on zonisamide  ROS: negative except above Examination  Vital signs in last 24 hours: Temp:  [97.7 F (36.5 C)-98.2 F (36.8 C)] 97.8 F (36.6 C) (10/16 0730) Pulse Rate:  [61-85] 68 (10/16 0730) Resp:  [17-20] 17 (10/16 0730) BP: (137-156)/(91-104) 156/104 (10/16 0730) SpO2:  [96 %-100 %] 100 % (10/16 0730)  General: lying in bed, NAD Neuro: MS: Alert, oriented, follows commands CN: pupils equal and reactive,  EOMI, face symmetric, tongue midline, normal sensation over face, Motor: 5/5 strength in all 4 extremities Coordination: normal Gait: not tested  Basic Metabolic Panel: Recent Labs  Lab 09/08/24 1831 09/09/24 0929  NA 139 138  K 4.0 3.9  CL 102 105  CO2 27 21*  GLUCOSE 111* 70  BUN 13 13  CREATININE 0.87 0.75  CALCIUM 10.2 9.2  MG 1.9  --     CBC: Recent Labs  Lab 09/08/24 1831 09/09/24 0929  WBC 11.4* 10.1  NEUTROABS 10.2* 7.0  HGB 13.2 12.5  HCT 42.9 38.9  MCV 86.5 85.5  PLT 286 244     Coagulation Studies: No results for input(s): LABPROT, INR in the last 72 hours.  Imaging No new brain imaging overnight  ASSESSMENT AND PLAN:58 year old female with history of left handed Abrol epilepsy who presented with breakthrough seizures.  Unclear if patient has been noncompliant or unable to take medications due to polypharmacy.     Epilepsy with breakthrough seizure - Continue Keppra  3000 mg once daily at bedtime and zonisamide 300 mg nightly - Continue Depakote  and Klonopin  at current dose -Rest medication: Already has Valtoco  - Discussed importance of medication with patient - Continue seizure precautions - Continue to follow-up with Dr. Georjean.  Of note patient has missed last 2 appointments.  Therefore it is really important she follows up during her next appointment   Seizure precautions: Per [place]  DMV statutes, patients with seizures are not allowed  to drive until they have been seizure-free for six months and cleared by a physician    Use caution when using heavy equipment or power tools. Avoid working on ladders or at heights. Take showers instead of baths. Ensure the water temperature is not too high on the home water heater. Do not go swimming alone. Do not lock yourself in a room alone (i.e. bathroom). When caring for infants or small children, sit down when holding, feeding, or changing them to minimize risk of injury to the child in the event you have a seizure. Maintain good sleep hygiene. Avoid alcohol.    If patient has another seizure, call 911 and bring them back to the ED if: A.  The seizure lasts longer than 5 minutes.      B.  The patient doesn't wake shortly after the seizure or has new problems such as difficulty seeing, speaking or moving following the seizure C.  The patient was injured during the seizure D.  The patient has a temperature over 102 F (39C) E.  The patient vomited during the seizure and now is having trouble breathing    During the Seizure   - First, ensure adequate ventilation and place patients on the floor on their left side  Loosen clothing around the neck and ensure the airway is patent. If the patient is clenching the teeth, do not force the mouth open with any object as this can cause severe damage - Remove all items from the surrounding that  can be hazardous. The patient may be oblivious to what's happening and may not even know what he or she is doing. If the patient is confused and wandering, either gently guide him/her away and block access to outside areas - Reassure the individual and be comforting - Call 911. In most cases, the seizure ends before EMS arrives. However, there are cases when seizures may last over 3 to 5 minutes. Or the individual may have developed breathing difficulties or severe injuries. If a pregnant patient or a person with diabetes develops a seizure, it is prudent to call  an ambulance.      After the Seizure (Postictal Stage)   After a seizure, most patients experience confusion, fatigue, muscle pain and/or a headache. Thus, one should permit the individual to sleep. For the next few days, reassurance is essential. Being calm and helping reorient the person is also of importance.   Most seizures are painless and end spontaneously. Seizures are not harmful to others but can lead to complications such as stress on the lungs, brain and the heart. Individuals with prior lung problems may develop labored breathing and respiratory distress.         I personally spent a total of 35 minutes in the care of the patient today including getting/reviewing separately obtained history, performing a medically appropriate exam/evaluation, counseling and educating, placing orders, referring and communicating with other health care professionals, documenting clinical information in the EHR, independently interpreting results, and coordinating care.          Arlin Krebs Epilepsy [facility] For questions after 5pm please refer to AMION to reach the Neurologist on call

## 2024-09-11 NOTE — Plan of Care (Signed)
  Problem: Health Behavior/Discharge Planning: Goal: Ability to manage health-related needs will improve Outcome: Progressing   Problem: Clinical Measurements: Goal: Will remain free from infection Outcome: Progressing   Problem: Activity: Goal: Risk for activity intolerance will decrease Outcome: Progressing   Problem: Nutrition: Goal: Adequate nutrition will be maintained Outcome: Progressing   Problem: Coping: Goal: Level of anxiety will decrease Outcome: Progressing   Problem: Elimination: Goal: Will not experience complications related to bowel motility Outcome: Progressing   Problem: Safety: Goal: Ability to remain free from injury will improve Outcome: Progressing   Problem: Skin Integrity: Goal: Risk for impaired skin integrity will decrease Outcome: Progressing

## 2024-09-11 NOTE — Discharge Summary (Signed)
 Physician Discharge Summary  Tammy [name]:[id number] DOB: 20-Oct-1966 DOA: 09/08/2024  PCP: Vicci Barnie NOVAK, MD  Admit date: 09/08/2024 Discharge date: 09/11/2024  Admitted From: Home Disposition:  Home  Discharge Condition:Stable CODE STATUS:FULL Diet recommendation: Heart Healthy  Brief/Interim Summary: Patient is a 58 year old female with history of left temporal lobe epilepsy, migraine, hypertension, asthma, GERD, marijuana  abuse who presented with headache, seizure at home.  On presentation ,she was hemodynamically stable.  CT head did not show any acute intracranial abnormalities.  In the ED, she had an episode of staring spell followed by whole body seizure requiring IV Ativan .  Afterwards, she became postictal.  Neurology consulted.  Patient was supposed to be on Depakote , Klonopin , Topamax , Keppra .  Her Depakote  level was undetectable.  Given Keppra , IV Depakote  load.  Suspected to have persistent nonconvulsive seizures, patient transferred to [facility] for LTM EEG.  Off EEG now.  Neurology finalized the antiseizure medications.  Her mentation is improved today.  PT recommended home health.  Medically stable for discharge.  Following problems were addressed during the hospitalization:  Breakthrough seizures: CT head did not show any acute findings.  Was taking Depakote , Klonopin , Topamax , Keppra  as an outpatient.  Depakote  level was undetectable, suspect noncompliance.  She said she did not take Depakote .  Had an episode of seizure-like event in the emergency department.  Suspected to have continuous nonconvulsive seizures.  Started on LTM EEG, now off.  EEG showed focal epilepsy arising from the left anterior temporal region, no seizures. Mentation has improved.  Currently alert and oriented.  She will continue Klonopin , Depakote , Keppra  and zonisamide.  We recommend to follow-up with her neurologist as an outpatient. Alert and oriented today, obeys commands and ambulates well    Hypertension:   Takes amlodipine  and losartan  at home.  Will restart   Asthma: Stable.  No signs of exacerbation.     Discharge Diagnoses:  Principal Problem:   Seizures (HCC) Active Problems:   Seizure (HCC)   Asthma   Hypertension    Discharge Instructions  Discharge Instructions     Diet - low sodium heart healthy   Complete by: As directed    Discharge instructions   Complete by: As directed    1)Please take your medications as instructed. Donot miss any seizure meds 2)Follow up with your neurologist in 1 to 2 weeks. 3)Per [place]  DMV statutes, patients with seizures are not allowed to drive until  they have been seizure-free for six months. Use caution when using heavy equipment or power tools. Avoid working on ladders or at heights. Take showers instead of baths. Ensure the water temperature is not too high on the home water heater. Do not go swimming alone. When caring for infants or small children, sit down when holding, feeding, or changing them to minimize risk of injury to the child in the event you have a seizure.  Maintain good sleep hygiene. Avoid alcohol. 4)Monitor your blood pressure at home   Increase activity slowly   Complete by: As directed       Allergies as of 09/11/2024       Reactions   Dilantin  [phenytoin ] Swelling   Aspirin Other (See Comments)   Patient unsure of reaction    Porcine (pork) Protein-containing Drug Products Other (See Comments)   Unknown- not religious    Tramadol Itching, Other (See Comments)   Pt states she is unsure of reaction        Medication List  STOP taking these medications    levETIRAcetam  750 MG tablet Commonly known as: KEPPRA  Replaced by: levETIRAcetam  ER 1500 MG Tb24   naproxen  500 MG tablet Commonly known as: NAPROSYN    topiramate  200 MG tablet Commonly known as: TOPAMAX    valproic  acid 250 MG capsule Commonly known as: DEPAKENE        TAKE these medications    acetaminophen  500  MG tablet Commonly known as: TYLENOL  Take 1 tablet (500 mg total) by mouth every 8 (eight) hours as needed. What changed:  how much to take when to take this reasons to take this   amLODipine  5 MG tablet Commonly known as: NORVASC  Take 1 tablet (5 mg total) by mouth daily.   clonazePAM  0.5 MG tablet Commonly known as: KLONOPIN  Take 1 tablet (0.5 mg total) by mouth 2 (two) times daily.   divalproex  500 MG DR tablet Commonly known as: DEPAKOTE  Take 1 tablet (500 mg total) by mouth 2 (two) times daily.   levETIRAcetam  ER 1500 MG Tb24 Take 3,000 mg by mouth at bedtime. Replaces: levETIRAcetam  750 MG tablet   lidocaine  5 % Commonly known as: Lidoderm  Place 1 patch onto the skin daily. Remove & Discard patch within 12 hours or as directed by MD What changed:  when to take this reasons to take this additional instructions   losartan  100 MG tablet Commonly known as: COZAAR  Take 1 tablet (100 mg total) by mouth daily.   Valtoco  15 MG Dose 2 x 7.5 MG/0.1ML Lqpk Generic drug: diazePAM  (15 MG Dose) Place 15 mg into the nose as needed (seizure lasting over 2 minutes).   Ventolin  HFA 108 (90 Base) MCG/ACT inhaler Generic drug: albuterol  Inhale 2 puffs into the lungs every 6 (six) hours as needed for wheezing or shortness of breath.   zonisamide 100 MG capsule Commonly known as: ZONEGRAN Take 3 capsules (300 mg total) by mouth at bedtime.        Follow-up Information     Vicci Barnie NOVAK, MD. Schedule an appointment as soon as possible for a visit in 1 week(s).   Specialty: Internal Medicine Contact information: [address] [place] [address] [phone]                Allergies  Allergen Reactions   Dilantin  [Phenytoin ] Swelling   Aspirin Other (See Comments)    Patient unsure of reaction    Porcine (Pork) Protein-Containing Drug Products Other (See Comments)    Unknown- not religious    Tramadol Itching and Other (See Comments)    Pt  states she is unsure of reaction    Consultations: Neurology   Procedures/Studies: Overnight EEG with video Result Date: 09/10/2024 Shelton Arlin KIDD, MD     09/10/2024  9:59 AM Patient Name: Tammy [name] MRN: [id number] Epilepsy Attending: Arlin KIDD Shelton Referring Physician/Provider: Voncile Isles, MD Duration: 09/09/2024 1827 to 09/10/2024 0935 Patient history: 58 y.o. female past history of temporal lobe epilepsy, migraines, hypertension presenting for evaluation of headache and altered mental status, who had a staring spell followed by a seizure in the emergency department at [facility].  She remains somewhat somnolent and does not appear to be back to baseline after that seizure EEG to evaluate for seizure Level of alertness: Awake, asleep AEDs during EEG study: Clonazepam , LEV, VPA, TPM Technical aspects: This EEG study was done with scalp electrodes positioned according to the 10-20 International system of electrode placement. Electrical activity was reviewed with band pass filter of  1-70Hz , sensitivity of 7 uV/mm, display speed of 90mm/sec with a 60Hz  notched filter applied as appropriate. EEG data were recorded continuously and digitally stored.  Video monitoring was available and reviewed as appropriate. Description: The posterior dominant rhythm consists of 9-10 Hz activity of moderate voltage (25-35 uV) seen predominantly in posterior head regions, symmetric and reactive to eye opening and eye closing. Sleep was characterized by vertex waves, sleep spindles (12 to 14 Hz), maximal frontocentral region. Left anterior temporal polyspikes were noted.  Sharp transients were noted in right anterior temporal region.  EEG also showed intermittent generalized 3 to 5 Hz theta-delta slowing admixed with 13-15hz  beta activity. Hyperventilation and photic stimulation were not performed.   ABNORMALITY - Polyspikes, left anterior temporal region - Intermittent slow, generalized IMPRESSION: This  study is consistent with patient's known history of focal epilepsy arising from left anterior temporal region.  Additionally there is mild generalized cerebral dysfunction (encephalopathy). No seizures were seen throughout the recording. Arlin MALVA Krebs   DG CHEST PORT 1 VIEW Result Date: 09/09/2024 EXAM: 1 VIEW(S) XRAY OF THE CHEST 09/09/2024 05:07:00 AM COMPARISON: Portable chest 05/02/2024. CLINICAL HISTORY: 105090 Seizure (HCC) 105090. Seizure.; Per pt chart: Patient states woke up this am feeling bad. Patient brought in by EMS from home and lives with family. C/O headache/malaise.; Best images obtainable, due to pt condition.; Unable to remove shirt, moved gems on shirt up out of lungs as much as possible. Per pt chart: Patient states woke up this am feeling bad. Patient brought in by EMS from home and lives with family. C/O headache/malaise. FINDINGS: LUNGS AND PLEURA: Central vascular prominence but no overt edema. Mild increased opacity in the lateral left lung base, which could be interval increased atelectasis or developing pneumonia. This could either be in the lower lobe or the lingula. The remainder of the lungs are clear. No pleural effusion is seen. No pneumothorax. HEART AND MEDIASTINUM: The heart is moderately enlarged. The mediastinum is normally outlined. BONES AND SOFT TISSUES: The thoracic cage is intact. Artifact from overlying wires and clothing. IMPRESSION: 1. Mild increased opacity in the lateral left lung base, which may represent atelectasis or developing pneumonia in the lower lobe or lingula. 2. Moderate cardiomegaly with central vascular prominence, without overt edema. Electronically signed by: Francis Quam MD 09/09/2024 05:25 AM EDT RP Workstation: [id number]   CT Head Wo Contrast Result Date: 09/08/2024 EXAM: CT HEAD WITHOUT CONTRAST 09/08/2024 08:11:28 PM TECHNIQUE: CT of the head was performed without the administration of intravenous contrast. Automated exposure control,  iterative reconstruction, and/or weight based adjustment of the mA/kV was utilized to reduce the radiation dose to as low as reasonably achievable. COMPARISON: None available. CLINICAL HISTORY: Right sided headache. Patient states woke up this am feeling bad. Patient brought in by EMS from home and lives with family. Complains of headache and malaise. FINDINGS: BRAIN AND VENTRICLES: No acute hemorrhage. No evidence of acute infarct. No hydrocephalus. No extra-axial collection. No mass effect or midline shift. ORBITS: No acute abnormality. SINUSES: No acute abnormality. SOFT TISSUES AND SKULL: No acute soft tissue abnormality. No skull fracture. IMPRESSION: 1. No acute intracranial abnormality. Electronically signed by: Morgane Naveau MD 09/08/2024 08:19 PM EDT RP Workstation: [id number]      Subjective: Patient seen and examined at bedside today.  Mentation has improved from yesterday.  Overall oriented.  PT recommended home health.  Medically stable for discharge.  I called her daughter Zquira to discuss about discharge planning,call not received  Discharge Exam: Vitals:  09/11/24 0328 09/11/24 0730  BP: (!) 141/96 (!) 156/104  Pulse: 67 68  Resp: 18 17  Temp: 98 F (36.7 C) 97.8 F (36.6 C)  SpO2: 100% 100%   Vitals:   09/10/24 1926 09/10/24 2337 09/11/24 0328 09/11/24 0730  BP: (!) 138/91 (!) 137/94 (!) 141/96 (!) 156/104  Pulse: 85 63 67 68  Resp: 18 18 18 17   Temp: 97.7 F (36.5 C) 97.9 F (36.6 C) 98 F (36.7 C) 97.8 F (36.6 C)  TempSrc: Oral Oral Oral Oral  SpO2: 98% 96% 100% 100%  Weight:      Height:        General: Pt is alert, awake, not in acute distress Cardiovascular: RRR, S1/S2 +, no rubs, no gallops Respiratory: CTA bilaterally, no wheezing, no rhonchi Abdominal: Soft, NT, ND, bowel sounds + Extremities: no edema, no cyanosis    The results of significant diagnostics from this hospitalization (including imaging, microbiology, ancillary and laboratory) are  listed below for reference.     Microbiology: No results found for this or any previous visit (from the past 240 hours).   Labs: BNP (last 3 results) No results for input(s): BNP in the last 8760 hours. Basic Metabolic Panel: Recent Labs  Lab 09/08/24 1831 09/09/24 0929  NA 139 138  K 4.0 3.9  CL 102 105  CO2 27 21*  GLUCOSE 111* 70  BUN 13 13  CREATININE 0.87 0.75  CALCIUM 10.2 9.2  MG 1.9  --    Liver Function Tests: Recent Labs  Lab 09/08/24 1831  AST 22  ALT 7  ALKPHOS 79  BILITOT 0.4  PROT 8.1  ALBUMIN 4.5   No results for input(s): LIPASE, AMYLASE in the last 168 hours. No results for input(s): AMMONIA in the last 168 hours. CBC: Recent Labs  Lab 09/08/24 1831 09/09/24 0929  WBC 11.4* 10.1  NEUTROABS 10.2* 7.0  HGB 13.2 12.5  HCT 42.9 38.9  MCV 86.5 85.5  PLT 286 244   Cardiac Enzymes: No results for input(s): CKTOTAL, CKMB, CKMBINDEX, TROPONINI in the last 168 hours. BNP: Invalid input(s): POCBNP CBG: No results for input(s): GLUCAP in the last 168 hours. D-Dimer No results for input(s): DDIMER in the last 72 hours. Hgb A1c No results for input(s): HGBA1C in the last 72 hours. Lipid Profile No results for input(s): CHOL, HDL, LDLCALC, TRIG, CHOLHDL, LDLDIRECT in the last 72 hours. Thyroid  function studies No results for input(s): TSH, T4TOTAL, T3FREE, THYROIDAB in the last 72 hours.  Invalid input(s): FREET3 Anemia work up No results for input(s): VITAMINB12, FOLATE, FERRITIN, TIBC, IRON, RETICCTPCT in the last 72 hours. Urinalysis    Component Value Date/Time   COLORURINE YELLOW 09/09/2024 0938   APPEARANCEUR CLEAR 09/09/2024 0938   LABSPEC 1.014 09/09/2024 0938   PHURINE 5.0 09/09/2024 0938   GLUCOSEU NEGATIVE 09/09/2024 0938   HGBUR NEGATIVE 09/09/2024 0938   BILIRUBINUR NEGATIVE 09/09/2024 0938   KETONESUR NEGATIVE 09/09/2024 0938   PROTEINUR 100 (A) 09/09/2024 0938    UROBILINOGEN 1.0 02/05/2014 1852   NITRITE NEGATIVE 09/09/2024 0938   LEUKOCYTESUR NEGATIVE 09/09/2024 0938   Sepsis Labs Recent Labs  Lab 09/08/24 1831 09/09/24 0929  WBC 11.4* 10.1   Microbiology No results found for this or any previous visit (from the past 240 hours).  Please note: You were cared for by a hospitalist during your hospital stay. Once you are discharged, your primary care physician will handle any further medical issues. Please note that NO REFILLS for any  discharge medications will be authorized once you are discharged, as it is imperative that you return to your primary care physician (or establish a relationship with a primary care physician if you do not have one) for your post hospital discharge needs so that they can reassess your need for medications and monitor your lab values.    Time coordinating discharge: 40 minutes  SIGNED:   Ivonne Mustache, MD  [facility] 09/11/2024, 10:06 AM Pager [phone]  If 7PM-7AM, please contact night-coverage [email or web address] Password [email or web address]

## 2024-09-11 NOTE — TOC Transition Note (Signed)
 Transition of Care [facility]) - Discharge Note   Patient Details  Name: Tammy [name] MRN: [id number] Date of Birth: 14-Feb-1966  Transition of Care [facility] CM/SW Contact:  Andrez JULIANNA George, RN Phone Number: 09/11/2024, 11:20 AM   Clinical Narrative:     Pt is discharging home with home health services through [facility]. Information on the AVS. [facility] will contact her for the first home visit.  CM spoke to pts son and he will also follow up on her medication compliance at home.  Son is transporting her home today.   Final next level of care: Home w Home Health Services Barriers to Discharge: No Barriers Identified   Patient Goals and CMS Choice       [place] ownership interest in [facility].provided to:: Patient    Discharge Placement                       Discharge Plan and Services Additional resources added to the After Visit Summary for     Discharge Planning Services: CM Consult                      HH Arranged: RN, PT [facility] Agency: [facility] Date [facility] Agency Contacted: 09/11/24   Representative spoke with at [facility] Agency: [place]  Social Drivers of Health (SDOH) Interventions SDOH Screenings   Food Insecurity: No Food Insecurity (09/09/2024)  Housing: Low Risk  (09/09/2024)  Transportation Needs: No Transportation Needs (09/09/2024)  Utilities: Not At Risk (09/09/2024)  Depression (PHQ2-9): Low Risk  (06/01/2023)  Tobacco Use: Low Risk  (09/09/2024)     Readmission Risk Interventions     No data to display

## 2024-09-12 ENCOUNTER — Telehealth: Payer: Self-pay

## 2024-09-12 ENCOUNTER — Telehealth: Payer: Self-pay | Admitting: Internal Medicine

## 2024-09-12 NOTE — Telephone Encounter (Addendum)
 Called patient, unable to reach patient or leave voicemail.   If patient calls back. Please advise her to go to the mobile clinic for a sooner appointment. Dr. Vicci does not have any availability this month.

## 2024-09-12 NOTE — Transitions of Care (Post Inpatient/ED Visit) (Signed)
 09/12/2024  Name: Tammy [name] MRN: [id number] DOB: 1966-08-01  Today's TOC FU Call Status: Today's TOC FU Call Status:: Successful TOC FU Call Completed TOC FU Call Complete Date: 09/12/24 [name] and Date of Birth confirmed.  Transition Care Management Follow-up Telephone Call Date of Discharge: 09/11/24 Discharge Facility: [facility]) Type of Discharge: Inpatient Admission Primary Inpatient Discharge Diagnosis:: status epilepticus How have you been since you were released from the hospital?: Better Any questions or concerns?: No  Items Reviewed: Did you receive and understand the discharge instructions provided?: Yes (Reviewed AVS with patient) Medications obtained,verified, and reconciled?: Yes (Medications Reviewed) (Reviewed with patient medications according to AVS) Any new allergies since your discharge?: No Dietary orders reviewed?: Yes Type of Diet Ordered:: low sodium heart healthy Do you have support at home?: Yes People in Home [RPT]: alone, child(ren), adult Name of Support/Comfort Primary Source: My son Efrem and my children checks on nearly me every day  Medications Reviewed Today: Medications Reviewed Today     Reviewed by Eilleen Richerd GRADE, RN (Registered Nurse) on 09/12/24 at 1244  Med List Status: <None>   Medication Order Taking? Sig Documenting Provider Last Dose Status Informant  acetaminophen  (TYLENOL ) 500 MG tablet [id number]  Take 1 tablet (500 mg total) by mouth every 8 (eight) hours as needed.  Patient taking differently: Take 1,000 mg by mouth daily as needed for headache, moderate pain (pain score 4-6) or mild pain (pain score 1-3).   Vicci Barnie NOVAK, MD  Active Child, Pharmacy Records  albuterol  (VENTOLIN  HFA) 108 [phone] Base) MCG/ACT inhaler [id number] Yes Inhale 2 puffs into the lungs every 6 (six) hours as needed for wheezing or shortness of breath. Vicci Barnie NOVAK, MD  Active Child, Pharmacy Records  amLODipine  (NORVASC ) 5  MG tablet [id number] Yes Take 1 tablet (5 mg total) by mouth daily. Jillian Buttery, MD  Active   clonazePAM  (KLONOPIN ) 0.5 MG tablet [id number] Yes Take 1 tablet (0.5 mg total) by mouth 2 (two) times daily. Jillian Buttery, MD  Active   diazePAM , 15 MG Dose, (VALTOCO  15 MG DOSE) 2 x 7.5 MG/0.1ML LQPK [id number]  Place 15 mg into the nose as needed (seizure lasting over 2 minutes). Lafe Domino, DO  Active Child, Pharmacy Records  divalproex  (DEPAKOTE ) 500 MG DR tablet [id number] Yes Take 1 tablet (500 mg total) by mouth 2 (two) times daily. Jillian Buttery, MD  Active   levETIRAcetam  (KEPPRA  XR) 500 MG 24 hr tablet [id number] Yes Take 6 tablets (3,000 mg total) by mouth at bedtime. Jillian Buttery, MD  Active   lidocaine  (LIDODERM ) 5 % [id number]  Place 1 patch onto the skin daily. Remove & Discard patch within 12 hours or as directed by MD  Patient taking differently: Place 1 patch onto the skin daily as needed (pain).   Lang Norleen POUR, PA-C  Active Child, Pharmacy Records   Patient not taking:   Discontinued 12/08/20 1452 (Stop Taking at Discharge) losartan  (COZAAR ) 100 MG tablet [id number] Yes Take 1 tablet (100 mg total) by mouth daily. Jillian Buttery, MD  Active   zonisamide (ZONEGRAN) 100 MG capsule [id number] Yes Take 3 capsules (300 mg total) by mouth at bedtime. Jillian Buttery, MD  Active             Home Care and Equipment/Supplies: Were Home Health Services Ordered?: Yes Name of Home Health Agency:: [facility] Has Agency set up a time to come to your home?: Yes First Home Health Visit Date:  (  I don't remember if they told me a day but they said they would be coming) Any new equipment or medical supplies ordered?: No  Functional Questionnaire: Do you need assistance with bathing/showering or dressing?: No Do you need assistance with meal preparation?: No Do you have difficulty maintaining continence: No Do you need assistance with getting out of bed/getting out of a  chair/moving?: No Do you have difficulty managing or taking your medications?:  (sometimes)  Follow up appointments reviewed: PCP Follow-up appointment confirmed?: No (I will call myself, I want to talk to Dr. Vicci myself) Specialist Hospital Follow-up appointment confirmed?: NA Do you need transportation to your follow-up appointment?: No Do you understand care options if your condition(s) worsen?: Yes-patient verbalized understanding  SDOH Interventions Today    Flowsheet Row Most Recent Value  SDOH Interventions   Housing Interventions Intervention Not Indicated  Transportation Interventions Intervention Not Indicated  Utilities Interventions Intervention Not Indicated    Goals Addressed             This Visit's Progress    VBCI Transitions of Care (TOC) Care Plan       Problems:  Recent Hospitalization for treatment of Seizures :  Patient is a 58 year old female with history of left temporal lobe epilepsy, migraine, hypertension, asthma, GERD, marijuana abuse who presented with headache, seizure at home.  Medication management barrier adherence and No Hospital Follow Up Provider appointment patient decline to have appointment made states, I need to talk to Dr. Vicci, I will call and get the appointmemt today  Goal:  Over the next 30 days, the patient will not experience hospital readmission  Interventions:  Transitions of Care: Doctor Visits  - discussed the importance of doctor visits  Hypertension Interventions: Last practice recorded BP readings:  BP Readings from Last 3 Encounters:  09/11/24 (!) 152/110  06/22/24 (!) 162/87  05/26/24 124/76   Most recent eGFR/CrCl:  Lab Results  Component Value Date   EGFR 83 04/24/2023    No components found for: CRCL  Reviewed medications with patient and discussed importance of compliance Counseled on adverse effects of illicit drug and excessive alcohol use in patients with high blood pressure  Discussed  plans with patient for ongoing care management follow up and provided patient with direct contact information for care management team Advised patient, providing education and rationale, to monitor blood pressure daily and record, calling PCP for findings outside established parameters Reviewed scheduled/upcoming provider appointments including:   Patient Self Care Activities:  check blood pressure daily write blood pressure results in a log or diary take blood pressure log to all doctor appointments keep all doctor appointments take medications for blood pressure exactly as prescribed Discharge Instructions reviewed from AVS:  Diet - low sodium heart healthy Discharge instructions 1)Please take your medications as instructed. Donot miss any seizure meds 2)Follow up with your neurologist in 1 to 2 weeks. 3)Per [employer]  DMV statutes, patients with seizures are not allowed to drive until they have been seizurefree for six months. Use caution when using heavy equipment or power tools. Avoid working on ladders or at heights. Take showers instead of baths. Ensure the water temperature is not too high on the home water heater. Do not go swimming alone. When caring for infants or small children, sit down when holding, feeding, or changing them to minimize risk of injury to the child in the event you have a seizure. Maintain good sleep hygiene. Avoid alcohol. 4)Monitor your blood pressure at home Plan:  The care management team will reach out to the patient again over the next 5-10 business days. The patient has been provided with contact information for the care management team and has been advised to call with any health related questions or concerns.        Discussed and offered 30 day TOC program.  Patient agrees to enroll in weekly calls..  The patient has been provided with contact information for the care management team and has been advised to call with any health -related  questions or concerns.  The patient verbalized understanding with current plan of care.  The patient is directed to their insurance card regarding availability of benefits coverage.    Richerd Fish, RN, BSN, CCM [facility], [facility] RN Care Manager Direct Dial: [phone]

## 2024-09-12 NOTE — Telephone Encounter (Signed)
 Copied from CRM #[id number]. Topic: Appointments - Scheduling Inquiry for Clinic >> Sep 12, 2024  1:57 PM Wess RAMAN wrote:  Patient needs hospital follow up. None available within 2 weeks. Discharge date 09/11/24  Callback #: [id number]

## 2024-09-12 NOTE — Patient Instructions (Signed)
 Visit Information  Thank you for taking time to visit with me today. Please don't hesitate to contact me if I can be of assistance to you before our next scheduled telephone appointment.  Our next appointment is by telephone on 09/19/24 at 10:00 AM with Andrea Dimes, RN  Following is a copy of your care plan:   Goals Addressed             This Visit's Progress    VBCI Transitions of Care (TOC) Care Plan       Problems:  Recent Hospitalization for treatment of Seizures :  Patient is a 58 year old female with history of left temporal lobe epilepsy, migraine, hypertension, asthma, GERD, marijuana abuse who presented with headache, seizure at home.  Medication management barrier adherence and No Hospital Follow Up Provider appointment patient decline to have appointment made states, I need to talk to Dr. Vicci, I will call and get the appointmemt today  Goal:  Over the next 30 days, the patient will not experience hospital readmission  Interventions:  Transitions of Care: Doctor Visits  - discussed the importance of doctor visits  Hypertension Interventions: Last practice recorded BP readings:  BP Readings from Last 3 Encounters:  09/11/24 (!) 152/110  06/22/24 (!) 162/87  05/26/24 124/76   Most recent eGFR/CrCl:  Lab Results  Component Value Date   EGFR 83 04/24/2023    No components found for: CRCL  Reviewed medications with patient and discussed importance of compliance Counseled on adverse effects of illicit drug and excessive alcohol use in patients with high blood pressure  Discussed plans with patient for ongoing care management follow up and provided patient with direct contact information for care management team Advised patient, providing education and rationale, to monitor blood pressure daily and record, calling PCP for findings outside established parameters Reviewed scheduled/upcoming provider appointments including:   Patient Self Care Activities:  check  blood pressure daily write blood pressure results in a log or diary take blood pressure log to all doctor appointments keep all doctor appointments take medications for blood pressure exactly as prescribed Discharge Instructions reviewed from AVS:  Diet - low sodium heart healthy Discharge instructions 1)Please take your medications as instructed. Donot miss any seizure meds 2)Follow up with your neurologist in 1 to 2 weeks. 3)Per [place]  DMV statutes, patients with seizures are not allowed to drive until they have been seizurefree for six months. Use caution when using heavy equipment or power tools. Avoid working on ladders or at heights. Take showers instead of baths. Ensure the water temperature is not too high on the home water heater. Do not go swimming alone. When caring for infants or small children, sit down when holding, feeding, or changing them to minimize risk of injury to the child in the event you have a seizure. Maintain good sleep hygiene. Avoid alcohol. 4)Monitor your blood pressure at home Plan:  The care management team will reach out to the patient again over the next 5-10 business days. The patient has been provided with contact information for the care management team and has been advised to call with any health related questions or concerns.         Patient verbalizes understanding of instructions and care plan provided today and agrees to view in MyChart. Active MyChart status and patient understanding of how to access instructions and care plan via MyChart confirmed with patient.     The patient has been provided with contact information for the care management  team and has been advised to call with any health related questions or concerns.   Please call the care guide team at [phone] if you need to cancel or reschedule your appointment.   Please call the Suicide and Crisis Lifeline: 988 call the [place]  National Suicide Prevention Lifeline:  212-054-7020 or TTY: (203) 573-9521 TTY 445-236-3272) to talk to a trained counselor call 1-800-273-TALK (toll free, 24 hour hotline) go to [facility] [address] [place] [phone]) call 911 if you are experiencing a Mental Health or Behavioral Health Crisis or need someone to talk to.  Richerd Fish, RN, BSN, CCM [facility], [facility] RN Care Manager Direct Dial: [phone]

## 2024-09-15 ENCOUNTER — Other Ambulatory Visit: Payer: MEDICAID

## 2024-09-15 NOTE — Patient Outreach (Signed)
 BSW outreached patient today to get her connected with [employer]. During the call patient stated that she had recently been discharged from the hospital and she had been in contact with her [facility]. BSW provided patient with their direct line number if she had any further questions. BSW will close the case at this time.   Orlean Fey, BSW [place]  [facility] Social Worker, [employer] [facility] [phone]

## 2024-09-15 NOTE — Patient Instructions (Signed)
 Referral received from Vicci Barnie NOVAK, MD for assistance with care management needs. This patient is enrolled in [employer] tailored planned and has associated care management benefits. I reached out to the health plan today to refer the patient to the assigned health plan care management team member.   Interventions:  Tammy [name] was advised to anticipate contact from the health plan for follow up about care management services and resources.   Tammy [name], BSW [place]  [facility] Social Worker, [employer] [facility] [phone]

## 2024-09-16 ENCOUNTER — Emergency Department (HOSPITAL_COMMUNITY)
Admission: EM | Admit: 2024-09-16 | Discharge: 2024-09-16 | Discharge status: Against Medical Advice | Payer: MEDICAID | Attending: Emergency Medicine | Admitting: Emergency Medicine

## 2024-09-16 ENCOUNTER — Encounter (HOSPITAL_COMMUNITY): Payer: Self-pay

## 2024-09-16 ENCOUNTER — Emergency Department (HOSPITAL_COMMUNITY): Payer: MEDICAID

## 2024-09-16 ENCOUNTER — Other Ambulatory Visit: Payer: Self-pay

## 2024-09-16 DIAGNOSIS — Z5321 Procedure and treatment not carried out due to patient leaving prior to being seen by health care provider: Secondary | ICD-10-CM | POA: Insufficient documentation

## 2024-09-16 DIAGNOSIS — N644 Mastodynia: Secondary | ICD-10-CM | POA: Diagnosis present

## 2024-09-16 LAB — I-STAT CHEM 8, ED
BUN: 13 mg/dL (ref 6–20)
Calcium, Ion: 1.26 mmol/L (ref 1.15–1.40)
Chloride: 110 mmol/L (ref 98–111)
Creatinine, Ser: 0.9 mg/dL (ref 0.44–1.00)
Glucose, Bld: 81 mg/dL (ref 70–99)
HCT: 45 % (ref 36.0–46.0)
Hemoglobin: 15.3 g/dL — ABNORMAL HIGH (ref 12.0–15.0)
Potassium: 4 mmol/L (ref 3.5–5.1)
Sodium: 144 mmol/L (ref 135–145)
TCO2: 23 mmol/L (ref 22–32)

## 2024-09-16 LAB — CBC WITH DIFFERENTIAL/PLATELET
Abs Immature Granulocytes: 0.01 K/uL (ref 0.00–0.07)
Basophils Absolute: 0.1 K/uL (ref 0.0–0.1)
Basophils Relative: 1 %
Eosinophils Absolute: 0.1 K/uL (ref 0.0–0.5)
Eosinophils Relative: 2 %
HCT: 44.3 % (ref 36.0–46.0)
Hemoglobin: 13.7 g/dL (ref 12.0–15.0)
Immature Granulocytes: 0 %
Lymphocytes Relative: 34 %
Lymphs Abs: 2.1 K/uL (ref 0.7–4.0)
MCH: 27.2 pg (ref 26.0–34.0)
MCHC: 30.9 g/dL (ref 30.0–36.0)
MCV: 87.9 fL (ref 80.0–100.0)
Monocytes Absolute: 0.5 K/uL (ref 0.1–1.0)
Monocytes Relative: 9 %
Neutro Abs: 3.4 K/uL (ref 1.7–7.7)
Neutrophils Relative %: 54 %
Platelets: 253 K/uL (ref 150–400)
RBC: 5.04 MIL/uL (ref 3.87–5.11)
RDW: 14.7 % (ref 11.5–15.5)
WBC: 6.3 K/uL (ref 4.0–10.5)
nRBC: 0 % (ref 0.0–0.2)

## 2024-09-16 LAB — HEPATIC FUNCTION PANEL
ALT: 9 U/L (ref 0–44)
AST: 15 U/L (ref 15–41)
Albumin: 3.7 g/dL (ref 3.5–5.0)
Alkaline Phosphatase: 59 U/L (ref 38–126)
Bilirubin, Direct: <0.1 mg/dL (ref 0.0–0.2)
Total Bilirubin: 0.7 mg/dL (ref 0.0–1.2)
Total Protein: 7.2 g/dL (ref 6.5–8.1)

## 2024-09-16 LAB — TROPONIN I (HIGH SENSITIVITY): Troponin I (High Sensitivity): 7 ng/L (ref <18)

## 2024-09-16 NOTE — ED Provider Triage Note (Signed)
 Emergency Medicine Provider Triage Evaluation Note  Tammy [name] , a 58 y.o. female  was evaluated in triage.  Pt complains of pain under her right breast that started this morning at about 3am. Pain is worse to touch under the left breast. No central chest pain, shortness of breath, or abdominal pain. She reports normal bowel movements. No fever or chills  Review of Systems  Positive: As above Negative: As above  Physical Exam  BP (!) 167/98 (BP Location: Right Arm)   Pulse 68   Temp 97.6 F (36.4 C) (Oral)   Resp 16   Ht 5' 3 (1.6 m)   Wt 59 kg   LMP 03/08/2016   SpO2 97%   BMI 23.03 kg/m  Gen:   Awake, no distress   Resp:  Normal effort  MSK:   Moves extremities without difficulty  Other:  No obvious abnormality of the breasts. No skin change noted. Tender to right lower rib cage under right breast. No abdominal TTP  Medical Decision Making  Medically screening exam initiated at 12:06 PM.  Appropriate orders placed.  Tammy [name] was informed that the remainder of the evaluation will be completed by another provider, this initial triage assessment does not replace that evaluation, and the importance of remaining in the ED until their evaluation is complete.     Veta Palma, PA-C 09/16/24 1210

## 2024-09-16 NOTE — ED Triage Notes (Signed)
 C/O pain on right breast/under breast that started at 0300.  Denies feelings lumps. Denies CP/ SHOB. Pt states it hurts to cough. Axox4. Pt has juandice eyes in triage.

## 2024-09-19 ENCOUNTER — Other Ambulatory Visit: Payer: MEDICAID | Admitting: *Deleted

## 2024-09-19 NOTE — Transitions of Care (Post Inpatient/ED Visit) (Signed)
 Transition of Care week 2  Visit Note  09/19/2024  Name: Tammy [name] MRN: [id number]          DOB: 07/12/1966  Situation: Patient enrolled in [facility] 30-day program. Visit completed with Ms. Maskell by telephone.   Background:   Initial Transition Care Management Follow-up Telephone Call Discharge Date and Diagnosis: 09/11/24, status epilepticus   Past Medical History:  Diagnosis Date   Asthma    Blood transfusion without reported diagnosis    'a long time ago   GERD (gastroesophageal reflux disease)    Headache(784.0)    Hypertension    Seizures (HCC)     Assessment: Patient Reported Symptoms: Cognitive Cognitive Status: Able to follow simple commands, Alert and oriented to person, place, and time, Normal speech and language skills      Neurological Neurological Review of Symptoms: Other: [place] Neurological Symptoms/Conditions [RPT]: Recent hospitalization for seizure activity. Denies any seizure activity since being discharged. Neurological Management Strategies: Medication therapy, Routine screening Neurological Self-Management Outcome: 4 (good) Neurological Comment: Patient reports taking medications consistently, denies missing any doses.  HEENT HEENT Symptoms Reported: No symptoms reported      Cardiovascular Cardiovascular Symptoms Reported: No symptoms reported Does patient have uncontrolled Hypertension?: Yes Is patient checking Blood Pressure at home?: No Patient's Recent BP reading at home: Patient checked while on the phone with RNCM BP 160/96-patient had recently taken BP medications Cardiovascular Management Strategies: Medication therapy, Routine screening Cardiovascular Self-Management Outcome: 3 (uncertain)  Respiratory Respiratory Symptoms Reported: No symptoms reported    Endocrine Endocrine Symptoms Reported: No symptoms reported Is patient diabetic?: No    Gastrointestinal Gastrointestinal Symptoms Reported: No symptoms reported       Genitourinary Genitourinary Symptoms Reported: No symptoms reported    Integumentary Integumentary Symptoms Reported: Bruising Additional Integumentary Details: small bruise at IV site Skin Management Strategies: Routine screening Skin Self-Management Outcome: 4 (good)  Musculoskeletal Musculoskelatal Symptoms Reviewed: Limited mobility Musculoskeletal Management Strategies: Medical device, Routine screening Musculoskeletal Self-Management Outcome: 4 (good) Musculoskeletal Comment: ambulates with cane      Psychosocial Psychosocial Symptoms Reported: Not assessed         Vitals:   09/19/24 1025 09/19/24 1036  BP: (!) 160/96 135/89    Medications Reviewed Today     Reviewed by Lucky Andrea LABOR, RN (Registered Nurse) on 09/19/24 at 1032  Med List Status: <None>   Medication Order Taking? Sig Documenting Provider Last Dose Status Informant  acetaminophen  (TYLENOL ) 500 MG tablet [id number] Yes Take 1 tablet (500 mg total) by mouth every 8 (eight) hours as needed. Vicci Barnie NOVAK, MD  Active Child, Pharmacy Records  albuterol  (VENTOLIN  HFA) 108 [phone] Base) MCG/ACT inhaler [id number] Yes Inhale 2 puffs into the lungs every 6 (six) hours as needed for wheezing or shortness of breath. Vicci Barnie NOVAK, MD  Active Child, Pharmacy Records  amLODipine  (NORVASC ) 5 MG tablet [id number] Yes Take 1 tablet (5 mg total) by mouth daily. Jillian Buttery, MD  Active   clonazePAM  (KLONOPIN ) 0.5 MG tablet [id number] Yes Take 1 tablet (0.5 mg total) by mouth 2 (two) times daily. Jillian Buttery, MD  Active   diazePAM , 15 MG Dose, (VALTOCO  15 MG DOSE) 2 x 7.5 MG/0.1ML LQPK [id number] Yes Place 15 mg into the nose as needed (seizure lasting over 2 minutes). Lafe Domino, DO  Active Child, Pharmacy Records  divalproex  (DEPAKOTE ) 500 MG DR tablet [id number] Yes Take 1 tablet (500 mg total) by mouth 2 (two) times daily. Jillian Buttery, MD  Active  levETIRAcetam  (KEPPRA  XR) 500 MG 24 hr tablet [id number] Yes  Take 6 tablets (3,000 mg total) by mouth at bedtime. Jillian Buttery, MD  Active   lidocaine  (LIDODERM ) 5 % [id number] Yes Place 1 patch onto the skin daily. Remove & Discard patch within 12 hours or as directed by MD Lang Norleen POUR, PA-C  Active Child, Pharmacy Records   Patient not taking:   Discontinued 12/08/20 1452 (Stop Taking at Discharge) losartan  (COZAAR ) 100 MG tablet [id number] Yes Take 1 tablet (100 mg total) by mouth daily. Jillian Buttery, MD  Active   zonisamide (ZONEGRAN) 100 MG capsule [id number] Yes Take 3 capsules (300 mg total) by mouth at bedtime. Jillian Buttery, MD  Active             Recommendation:   Continue Current Plan of Care  Follow Up Plan:   Telephone follow-up in 1 week  Andrea Dimes RN, BSN Post  [facility] RN Care Manager [phone]

## 2024-09-19 NOTE — Patient Instructions (Signed)
 Visit Information  Thank you for taking time to visit with me today. Please don't hesitate to contact me if I can be of assistance to you before our next scheduled telephone appointment.   Following is a copy of your care plan:   Goals Addressed             This Visit's Progress    VBCI Transitions of Care (TOC) Care Plan       Problems:  Recent Hospitalization for treatment of Seizures :  Patient is a 58 year old female with history of left temporal lobe epilepsy, migraine, hypertension, asthma, GERD, marijuana abuse who presented with headache, seizure at home.  Medication management barrier adherence and No Hospital Follow Up Provider appointment patient decline to have appointment made states, I need to talk to Dr. Vicci, I will call and get the appointmemt today  Goal:  Over the next 30 days, the patient will not experience hospital readmission  Interventions:  Transitions of Care: Durable Medical Equipment (DME) reviewed with patient/caregiver Doctor Visits  - discussed the importance of doctor visits Post discharge activity limitations prescribed by provider reviewed [facility] assisted with scheduling hospital follow up at [facility] at [facility] on 09/29/24-first available, patient agreed [facility] assisted with scheduling with Neurology on 10/03/24 Medications reviewed, discussed the importance of compliance, patient reports taking as directed, not missing any doses    Hypertension Interventions: Last practice recorded BP readings:  BP Readings from Last 3 Encounters:  09/19/24 135/89  09/16/24 (!) 152/106  09/11/24 (!) 152/110   Most recent eGFR/CrCl:  Lab Results  Component Value Date   EGFR 83 04/24/2023    No components found for: CRCL  Reviewed medications with patient and discussed importance of compliance Counseled on adverse effects of illicit drug and excessive alcohol use in patients with high blood pressure  Discussed plans with patient for ongoing care  management follow up and provided patient with direct contact information for care management team Advised patient, providing education and rationale, to monitor blood pressure daily and record, calling PCP for findings outside established parameters Reviewed scheduled/upcoming provider appointments including: PCP on 09/29/24 and Neurology on 10/03/24 Reviewed BP readings Reviewed diet  Patient Self Care Activities:  check blood pressure daily write blood pressure results in a log or diary take blood pressure log to all doctor appointments keep all doctor appointments take medications for blood pressure exactly as prescribed    Plan:  Telephone follow up appointment with care management team member scheduled for:  09/26/24 at 9:45am The patient has been provided with contact information for the care management team and has been advised to call with any health related questions or concerns.         Patient verbalizes understanding of instructions and care plan provided today and agrees to view in MyChart. Active MyChart status and patient understanding of how to access instructions and care plan via MyChart confirmed with patient.     Telephone follow up appointment with care management team member scheduled for:09/26/24 at 9:45am  Please call the care guide team at [phone] if you need to cancel or reschedule your appointment.   Please call 1-800-273-TALK (toll free, 24 hour hotline) go to [facility] [address], [place] [phone]) call 911 if you are experiencing a Mental Health or Behavioral Health Crisis or need someone to talk to.  Andrea Dimes RN, BSN   [facility] RN Care Manager [phone]

## 2024-09-26 ENCOUNTER — Other Ambulatory Visit: Payer: MEDICAID | Admitting: *Deleted

## 2024-09-26 NOTE — Patient Instructions (Signed)
 Visit Information  Thank you for taking time to visit with me today. Please don't hesitate to contact me if I can be of assistance to you before our next scheduled telephone appointment.  Following is a copy of your care plan:   Goals Addressed             This Visit's Progress    VBCI Transitions of Care (TOC) Care Plan       Problems:  Recent Hospitalization for treatment of Seizures :  Patient is a 58 year old female with history of left temporal lobe epilepsy, migraine, hypertension, asthma, GERD, marijuana abuse who presented with headache, seizure at home.  Medication management barrier adherence and No Hospital Follow Up Provider appointment patient decline to have appointment made states, I need to talk to Dr. Vicci, I will call and get the appointmemt today  Goal:  Over the next 30 days, the patient will not experience hospital readmission  Interventions:  Transitions of Care: Durable Medical Equipment (DME) reviewed with patient/caregiver Doctor Visits  - discussed the importance of doctor visits Post discharge activity limitations prescribed by provider reviewed [facility] assisted with scheduling hospital follow up at [facility] Care at [facility] on 09/29/24-first available, patient agreed-verified patient has transportation [facility] assisted with scheduling with Neurology on 10/03/24-verified patient has transportation Medications reviewed, discussed the importance of compliance, patient reports taking as directed, not missing any doses Discussed patient is active with SW/Leo from [place] and CM/Mr. Bill from [place]   Hypertension Interventions: Last practice recorded BP readings:  BP Readings from Last 3 Encounters:  09/26/24 (!) 136/92  09/19/24 135/89  09/16/24 (!) 152/106   Most recent eGFR/CrCl:  Lab Results  Component Value Date   EGFR 83 04/24/2023    No components found for: CRCL  Reviewed medications with patient and discussed importance of compliance Counseled on  adverse effects of illicit drug and excessive alcohol use in patients with high blood pressure  Discussed plans with patient for ongoing care management follow up and provided patient with direct contact information for care management team Advised patient, providing education and rationale, to monitor blood pressure daily and record, calling PCP for findings outside established parameters Reviewed scheduled/upcoming provider appointments including: PCP on 09/29/24 and Neurology on 10/03/24 Reviewed BP readings, advised keeping a log and taking with her to upcoming appointments Reviewed diet, discussed MyPlate method in detail Encouraged exercise 30 minutes a day for 5 days a week, start with 10 minutes a day and work up  Patient Self Care Activities:  check blood pressure daily write blood pressure results in a log or diary take blood pressure log to all doctor appointments keep all doctor appointments take medications for blood pressure exactly as prescribed    Plan:  Telephone follow up appointment with care management team member scheduled for:  10/03/24 at 3:45pm        Patient verbalizes understanding of instructions and care plan provided today and agrees to view in MyChart. Active MyChart status and patient understanding of how to access instructions and care plan via MyChart confirmed with patient.     Telephone follow up appointment with care management team member scheduled for:  Please call the care guide team at [phone] if you need to cancel or reschedule your appointment.   Please call 1-800-273-TALK (toll free, 24 hour hotline) go to [facility] Urgent [facility] [address] [place] [phone]) call 911 if you are experiencing a Mental Health or Behavioral Health Crisis or need someone  to talk to.  Andrea Dimes RN, BSN [place]  Value-Based Care Institute [facility] RN Care Manager [phone]

## 2024-09-26 NOTE — Transitions of Care (Post Inpatient/ED Visit) (Signed)
 Transition of Care week 3  Visit Note  09/26/2024  Name: ADELAIDE PFEFFERKORN MRN: [id number]          DOB: Nov 25, 1966  Situation: Patient enrolled in [facility] 30-day program. Visit completed with Ms. Isip by telephone.   Background:   Initial Transition Care Management Follow-up Telephone Call Discharge Date and Diagnosis: 09/11/24, status epilepticus   Past Medical History:  Diagnosis Date   Asthma    Blood transfusion without reported diagnosis    'a long time ago   GERD (gastroesophageal reflux disease)    Headache(784.0)    Hypertension    Seizures (HCC)     Assessment: Patient Reported Symptoms: Cognitive Cognitive Status: Able to follow simple commands, Alert and oriented to person, place, and time, Normal speech and language skills      Neurological Neurological Review of Symptoms: No symptoms reported [place] Neurological Symptoms/Conditions [RPT]: Denies seizures since being discharge Neurological Management Strategies: Medication therapy, Routine screening Neurological Self-Management Outcome: 4 (good)  HEENT HEENT Symptoms Reported: Nasal discharge HEENT Management Strategies: Routine screening, Adequate rest HEENT Self-Management Outcome: 3 (uncertain) HEENT Comment: Patient feels she may be getting a little cold    Cardiovascular Cardiovascular Symptoms Reported: No symptoms reported Does patient have uncontrolled Hypertension?: Yes Is patient checking Blood Pressure at home?: Yes (checking twice daily) Patient's Recent BP reading at home: BP reading during telephone call was 136/92 Cardiovascular Management Strategies: Medication therapy, Routine screening, Coping strategies, Exercise Cardiovascular Self-Management Outcome: 4 (good)  Respiratory Respiratory Symptoms Reported: Dry cough Additional Respiratory Details: Occasional dry cough, patient feels she is getting a little cold    Endocrine Endocrine Symptoms Reported: No symptoms reported Is patient  diabetic?: No    Gastrointestinal Gastrointestinal Symptoms Reported: Constipation Additional Gastrointestinal Details: LBM 09/25/24 Gastrointestinal Management Strategies: Medication therapy Gastrointestinal Self-Management Outcome: 4 (good) Gastrointestinal Comment: RNCM provided education on constipation. Advised increasing water intake, fruits and vegetables and exercise.    Genitourinary Genitourinary Symptoms Reported: No symptoms reported    Integumentary Integumentary Symptoms Reported: No symptoms reported    Musculoskeletal Musculoskelatal Symptoms Reviewed: Weakness Musculoskeletal Management Strategies: Routine screening, Medical device Musculoskeletal Self-Management Outcome: 4 (good) Musculoskeletal Comment: ambulates with cane      Psychosocial Psychosocial Symptoms Reported: No symptoms reported     Quality of Family Relationships: helpful, involved, supportive   Vitals:   09/26/24 1034  BP: (!) 136/92    Medications Reviewed Today     Reviewed by Lucky Andrea LABOR, RN (Registered Nurse) on 09/26/24 at 1011  Med List Status: <None>   Medication Order Taking? Sig Documenting Provider Last Dose Status Informant  acetaminophen  (TYLENOL ) 500 MG tablet [id number] Yes Take 1 tablet (500 mg total) by mouth every 8 (eight) hours as needed. Vicci Barnie NOVAK, MD  Active Child, Pharmacy Records  albuterol  (VENTOLIN  HFA) 108 [phone] Base) MCG/ACT inhaler [id number] Yes Inhale 2 puffs into the lungs every 6 (six) hours as needed for wheezing or shortness of breath. Vicci Barnie NOVAK, MD  Active Child, Pharmacy Records  amLODipine  (NORVASC ) 5 MG tablet [id number] Yes Take 1 tablet (5 mg total) by mouth daily. Jillian Buttery, MD  Active   clonazePAM  (KLONOPIN ) 0.5 MG tablet [id number] Yes Take 1 tablet (0.5 mg total) by mouth 2 (two) times daily. Jillian Buttery, MD  Active   diazePAM , 15 MG Dose, (VALTOCO  15 MG DOSE) 2 x 7.5 MG/0.1ML LQPK [id number] Yes Place 15 mg into the nose as  needed (seizure lasting over 2 minutes). Lafe Domino, DO  Active Child, Pharmacy Records  divalproex  (DEPAKOTE ) 500 MG DR tablet [id number] Yes Take 1 tablet (500 mg total) by mouth 2 (two) times daily. Jillian Buttery, MD  Active   levETIRAcetam  (KEPPRA  XR) 500 MG 24 hr tablet [id number] Yes Take 6 tablets (3,000 mg total) by mouth at bedtime. Jillian Buttery, MD  Active   lidocaine  (LIDODERM ) 5 % [id number] Yes Place 1 patch onto the skin daily. Remove & Discard patch within 12 hours or as directed by MD Lang Norleen POUR, PA-C  Active Child, Pharmacy Records   Patient not taking:   Discontinued 12/08/20 1452 (Stop Taking at Discharge) losartan  (COZAAR ) 100 MG tablet [id number] Yes Take 1 tablet (100 mg total) by mouth daily. Jillian Buttery, MD  Active   zonisamide (ZONEGRAN) 100 MG capsule [id number] Yes Take 3 capsules (300 mg total) by mouth at bedtime. Jillian Buttery, MD  Active             Recommendation:   Continue Current Plan of Care  Follow Up Plan:   Telephone follow-up in 1 week  Andrea Dimes RN, BSN [place]  Value-Based Care Institute [facility] Health RN Care Manager [phone]

## 2024-09-29 ENCOUNTER — Telehealth: Payer: Self-pay | Admitting: Internal Medicine

## 2024-09-29 ENCOUNTER — Inpatient Hospital Stay: Payer: MEDICAID | Admitting: Family Medicine

## 2024-09-29 NOTE — Telephone Encounter (Signed)
 Called pt and left vm to call office back to reschedule missed appt

## 2024-10-03 ENCOUNTER — Other Ambulatory Visit: Payer: MEDICAID | Admitting: *Deleted

## 2024-10-03 ENCOUNTER — Ambulatory Visit: Payer: MEDICAID | Admitting: Neurology

## 2024-10-03 NOTE — Transitions of Care (Post Inpatient/ED Visit) (Signed)
 Transition of Care week 4  Visit Note  10/03/2024  Name: Tammy [name] MRN: [id number]          DOB: 05/05/1966  Situation: Patient enrolled in [facility] 30-day program. Visit completed with Ms. Garretson by telephone.   Background:   Initial Transition Care Management Follow-up Telephone Call Discharge Date and Diagnosis: 09/11/24, status epilepticus   Past Medical History:  Diagnosis Date   Asthma    Blood transfusion without reported diagnosis    'a long time ago   GERD (gastroesophageal reflux disease)    Headache(784.0)    Hypertension    Seizures (HCC)     Assessment: Patient Reported Symptoms: Cognitive Cognitive Status: Able to follow simple commands, Alert and oriented to person, place, and time, Normal speech and language skills      Neurological Neurological Review of Symptoms: No symptoms reported Neurological Management Strategies: Routine screening, Medication therapy Neurological Self-Management Outcome: 4 (good) Neurological Comment: Patient denies missing any medication doses. Missed neurology visit today, rescheduled to 10/14/24.  HEENT HEENT Symptoms Reported: Nasal discharge HEENT Management Strategies: Adequate rest HEENT Self-Management Outcome: 3 (uncertain) HEENT Comment: Patient reports having a cold    Cardiovascular Cardiovascular Symptoms Reported: No symptoms reported Does patient have uncontrolled Hypertension?: Yes Is patient checking Blood Pressure at home?: Yes Patient's Recent BP reading at home: BP today 167/93, pulse 62-taken this am. Rechecked during outreach 117/80, pulse 73 Cardiovascular Management Strategies: Adequate rest, Diet modification, Medication therapy, Routine screening Cardiovascular Self-Management Outcome: 4 (good) Cardiovascular Comment: Discussed taking BP 1-2 hours after taking BP medication. Reviewed diet and exercise.  Respiratory Respiratory Symptoms Reported: No symptoms reported    Endocrine Endocrine Symptoms  Reported: Not assessed    Gastrointestinal Gastrointestinal Symptoms Reported: No symptoms reported Additional Gastrointestinal Details: LBM 10/03/24 Gastrointestinal Management Strategies: Medication therapy Gastrointestinal Self-Management Outcome: 4 (good)    Genitourinary Genitourinary Symptoms Reported: No symptoms reported    Integumentary Integumentary Symptoms Reported: No symptoms reported    Musculoskeletal Musculoskelatal Symptoms Reviewed: Weakness Musculoskeletal Management Strategies: Routine screening, Medical device, Adequate rest Musculoskeletal Self-Management Outcome: 4 (good) Musculoskeletal Comment: ambulates with cane      Psychosocial Psychosocial Symptoms Reported: No symptoms reported         Vitals:   10/03/24 1604  BP: 117/80  Pulse: 73    Medications Reviewed Today     Reviewed by Lucky Andrea LABOR, RN (Registered Nurse) on 10/03/24 at 1559  Med List Status: <None>   Medication Order Taking? Sig Documenting Provider Last Dose Status Informant  acetaminophen  (TYLENOL ) 500 MG tablet [id number] Yes Take 1 tablet (500 mg total) by mouth every 8 (eight) hours as needed. Vicci Barnie NOVAK, MD  Active Child, Pharmacy Records  albuterol  (VENTOLIN  HFA) 108 [phone] Base) MCG/ACT inhaler [id number] Yes Inhale 2 puffs into the lungs every 6 (six) hours as needed for wheezing or shortness of breath. Vicci Barnie NOVAK, MD  Active Child, Pharmacy Records  amLODipine  (NORVASC ) 5 MG tablet [id number] Yes Take 1 tablet (5 mg total) by mouth daily. Jillian Buttery, MD  Active   clonazePAM  (KLONOPIN ) 0.5 MG tablet [id number] Yes Take 1 tablet (0.5 mg total) by mouth 2 (two) times daily. Jillian Buttery, MD  Active   diazePAM , 15 MG Dose, (VALTOCO  15 MG DOSE) 2 x 7.5 MG/0.1ML LQPK [id number] Yes Place 15 mg into the nose as needed (seizure lasting over 2 minutes). Lafe Domino, DO  Active Child, Pharmacy Records  divalproex  (DEPAKOTE ) 500 MG DR tablet [id number] Yes Take 1  tablet  (500 mg total) by mouth 2 (two) times daily. Jillian Buttery, MD  Active   levETIRAcetam  (KEPPRA  XR) 500 MG 24 hr tablet [id number] Yes Take 6 tablets (3,000 mg total) by mouth at bedtime. Jillian Buttery, MD  Active   lidocaine  (LIDODERM ) 5 % [id number] Yes Place 1 patch onto the skin daily. Remove & Discard patch within 12 hours or as directed by MD Lang Norleen POUR, PA-C  Active Child, Pharmacy Records   Patient not taking:   Discontinued 12/08/20 1452 (Stop Taking at Discharge) losartan  (COZAAR ) 100 MG tablet [id number] Yes Take 1 tablet (100 mg total) by mouth daily. Jillian Buttery, MD  Active   zonisamide (ZONEGRAN) 100 MG capsule [id number] Yes Take 3 capsules (300 mg total) by mouth at bedtime. Jillian Buttery, MD  Active             Recommendation:   Continue Current Plan of Care  Follow Up Plan:   Telephone follow-up in 1 week  Andrea Dimes RN, BSN [place]  Value-Based Care Institute [facility] Health RN Care Manager [phone]

## 2024-10-03 NOTE — Patient Instructions (Signed)
 Visit Information  Thank you for taking time to visit with me today. Please don't hesitate to contact me if I can be of assistance to you before our next scheduled telephone appointment.   Following is a copy of your care plan:   Goals Addressed             This Visit's Progress    VBCI Transitions of Care (TOC) Care Plan       Problems:  Recent Hospitalization for treatment of Seizures :  Patient is a 58 year old female with history of left temporal lobe epilepsy, migraine, hypertension, asthma, GERD, marijuana abuse who presented with headache, seizure at home.  Medication management barrier adherence and No Hospital Follow Up Provider appointment patient decline to have appointment made states, I need to talk to Dr. Vicci, I will call and get the appointmemt today  Goal:  Over the next 30 days, the patient will not experience hospital readmission  Interventions:  Transitions of Care: Doctor Visits  - discussed the importance of doctor visits Post discharge activity limitations prescribed by provider reviewed [facility] assisted with scheduling hospital follow up at Primary Care at [facility] on 09/29/24-patient missed appointment and will call to reschedule [facility] assisted with scheduling with Neurology on 10/03/24-patient missed appointment, rescheduled to 10/14/24 Medications reviewed, discussed the importance of compliance, patient reports taking as directed, not missing any doses Discussed patient is active with SW/Leo from [place] and CM/Mr. Bill from [place]   Hypertension Interventions: Last practice recorded BP readings:  BP Readings from Last 3 Encounters:  10/03/24 117/80  09/26/24 (!) 136/92  09/19/24 135/89   Most recent eGFR/CrCl:  Lab Results  Component Value Date   EGFR 83 04/24/2023    No components found for: CRCL  Reviewed medications with patient and discussed importance of compliance Advised patient, providing education and rationale, to monitor blood pressure  daily and record, calling PCP for findings outside established parameters Reviewed scheduled/upcoming provider appointments including: Neurology on 10/14/24 Reviewed BP readings, advised keeping a log and taking with her to upcoming appointments Reviewed diet, discussed MyPlate method in detail Encouraged exercise 30 minutes a day for 5 days a week, start with 10 minutes a day and work up-revisited  Patient Self Care Activities:  check blood pressure daily write blood pressure results in a log or diary take blood pressure log to all doctor appointments keep all doctor appointments take medications for blood pressure exactly as prescribed    Plan:  Telephone follow up appointment with care management team member scheduled for:  10/10/24 at 2:45pm        Patient verbalizes understanding of instructions and care plan provided today and agrees to view in MyChart. Active MyChart status and patient understanding of how to access instructions and care plan via MyChart confirmed with patient.     Telephone follow up appointment with care management team member scheduled for:10/10/24 at 2:45pm  Please call the care guide team at [phone] if you need to cancel or reschedule your appointment.   Please call 1-800-273-TALK (toll free, 24 hour hotline) go to [facility] Urgent [facility] [address] [place] [phone]) call 911 if you are experiencing a Mental Health or Behavioral Health Crisis or need someone to talk to.  Andrea Dimes RN, BSN [place]  Value-Based Care Institute [facility] Health RN Care Manager [phone]

## 2024-10-06 ENCOUNTER — Other Ambulatory Visit: Payer: Self-pay | Admitting: Internal Medicine

## 2024-10-06 ENCOUNTER — Other Ambulatory Visit: Payer: Self-pay

## 2024-10-06 ENCOUNTER — Other Ambulatory Visit (HOSPITAL_COMMUNITY): Payer: Self-pay

## 2024-10-09 ENCOUNTER — Other Ambulatory Visit: Payer: Self-pay

## 2024-10-09 ENCOUNTER — Other Ambulatory Visit: Payer: Self-pay | Admitting: Internal Medicine

## 2024-10-09 NOTE — Telephone Encounter (Signed)
 Copied from CRM [id number]. Topic: Clinical - Medication Refill >> Oct 09, 2024  8:44 AM Kendralyn S wrote: Medication: divalproex  (DEPAKOTE ) 500 MG DR tablet  levETIRAcetam  (KEPPRA  XR) 500 MG 24 hr tablet  zonisamide (ZONEGRAN) 100 MG capsule   Has the patient contacted their pharmacy? Yes (Agent: If no, request that the patient contact the pharmacy for the refill. If patient does not wish to contact the pharmacy document the reason why and proceed with request.) (Agent: If yes, when and what did the pharmacy advise?)  This is the patient's preferred pharmacy:  [facility] [address] [place] [address] Phone: [phone] Fax: [phone]   Is this the correct pharmacy for this prescription? Yes If no, delete pharmacy and type the correct one.   Has the prescription been filled recently? No  Is the patient out of the medication? No  Has the patient been seen for an appointment in the last year OR does the patient have an upcoming appointment? Yes  Can we respond through MyChart? Yes  Agent: Please be advised that Rx refills may take up to 3 business days. We ask that you follow-up with your pharmacy.

## 2024-10-10 ENCOUNTER — Other Ambulatory Visit: Payer: MEDICAID | Admitting: *Deleted

## 2024-10-10 NOTE — Transitions of Care (Post Inpatient/ED Visit) (Signed)
 Transition of Care week 5  Visit Note  10/10/2024  Name: Tammy [name] MRN: [id number]          DOB: 1966/10/02  Situation: Patient enrolled in [facility] 30-day program. Visit completed with Ms. Woolman by telephone.   Background:   Initial Transition Care Management Follow-up Telephone Call Discharge Date and Diagnosis: 09/11/24, status epilepticus   Past Medical History:  Diagnosis Date   Asthma    Blood transfusion without reported diagnosis    'a long time ago   GERD (gastroesophageal reflux disease)    Headache(784.0)    Hypertension    Seizures (HCC)     Assessment: Patient Reported Symptoms: Cognitive Cognitive Status: No symptoms reported      Neurological Neurological Review of Symptoms: No symptoms reported Neurological Management Strategies: Adequate rest, Routine screening, Medication therapy Neurological Self-Management Outcome: 4 (good) Neurological Comment: Patient reports taking medications as instructed. Denies seizure activity  HEENT HEENT Symptoms Reported: Not assessed      Cardiovascular Cardiovascular Symptoms Reported: No symptoms reported Does patient have uncontrolled Hypertension?: Yes Is patient checking Blood Pressure at home?: Yes Patient's Recent BP reading at home: BP today 137/88, pulse 72 Cardiovascular Management Strategies: Coping strategies, Adequate rest, Medication therapy, Routine screening Cardiovascular Self-Management Outcome: 4 (good)  Respiratory Respiratory Symptoms Reported: No symptoms reported    Endocrine Endocrine Symptoms Reported: Not assessed    Gastrointestinal Gastrointestinal Symptoms Reported: No symptoms reported Additional Gastrointestinal Details: LBM 10/10/24 Gastrointestinal Management Strategies: Adequate rest, Medication therapy Gastrointestinal Self-Management Outcome: 4 (good)    Genitourinary Genitourinary Symptoms Reported: No symptoms reported    Integumentary Integumentary Symptoms Reported: No  symptoms reported    Musculoskeletal Musculoskelatal Symptoms Reviewed: No symptoms reported Musculoskeletal Management Strategies: Adequate rest, Routine screening, Exercise Musculoskeletal Self-Management Outcome: 4 (good) Musculoskeletal Comment: ambulates with cane when outdoors, independent when inside      Psychosocial Psychosocial Symptoms Reported: Not assessed         Vitals:   10/10/24 1523  BP: 137/88  Pulse: 72   Pain Scale: 0-10  Medications Reviewed Today     Reviewed by Lucky Andrea LABOR, RN (Registered Nurse) on 10/10/24 at 1518  Med List Status: <None>   Medication Order Taking? Sig Documenting Provider Last Dose Status Informant  acetaminophen  (TYLENOL ) 500 MG tablet [id number] Yes Take 1 tablet (500 mg total) by mouth every 8 (eight) hours as needed. Vicci Barnie NOVAK, MD  Active Child, Pharmacy Records  albuterol  (VENTOLIN  HFA) 108 [phone]) MCG/ACT inhaler [id number] Yes Inhale 2 puffs into the lungs every 6 (six) hours as needed for wheezing or shortness of breath. Vicci Barnie NOVAK, MD  Active Child, Pharmacy Records  amLODipine  (NORVASC ) 5 MG tablet [id number] Yes Take 1 tablet (5 mg total) by mouth daily. Jillian Buttery, MD  Active   clonazePAM  (KLONOPIN ) 0.5 MG tablet [id number] Yes Take 1 tablet (0.5 mg total) by mouth 2 (two) times daily. Jillian Buttery, MD  Active   diazePAM , 15 MG Dose, (VALTOCO  15 MG DOSE) 2 x 7.5 MG/0.1ML LQPK [id number] Yes Place 15 mg into the nose as needed (seizure lasting over 2 minutes). Lafe Domino, DO  Active Child, Pharmacy Records  divalproex  (DEPAKOTE ) 500 MG DR tablet [id number] Yes Take 1 tablet (500 mg total) by mouth 2 (two) times daily. Jillian Buttery, MD  Active   levETIRAcetam  (KEPPRA  XR) 500 MG 24 hr tablet [id number] Yes Take 6 tablets (3,000 mg total) by mouth at bedtime. Jillian Buttery, MD  Active   lidocaine  (  LIDODERM ) 5 % [id number]  Place 1 patch onto the skin daily. Remove & Discard patch within 12 hours or as  directed by MD  Patient not taking: Reported on 10/10/2024   Robinson, John K, PA-C  Active Child, Pharmacy Records   Patient not taking:   Discontinued 12/08/20 1452 (Stop Taking at Discharge) losartan  (COZAAR ) 100 MG tablet [id number] Yes Take 1 tablet (100 mg total) by mouth daily. Jillian Buttery, MD  Active   zonisamide (ZONEGRAN) 100 MG capsule [id number] Yes Take 3 capsules (300 mg total) by mouth at bedtime. Jillian Buttery, MD  Active             Recommendation:   Continue Current Plan of Care  Follow Up Plan:   Closing From:  Transitions of Care Program  Andrea Dimes RN, BSN [place]  Value-Based Care Institute [facility] Health RN Care Manager [phone]

## 2024-10-10 NOTE — Patient Instructions (Signed)
 Visit Information  Thank you for taking time to visit with me today. Please don't hesitate to contact me if I can be of assistance to you before our next scheduled telephone appointment.   Following is a copy of your care plan:   Goals Addressed             This Visit's Progress    VBCI Transitions of Care (TOC) Care Plan       Problems:  Recent Hospitalization for treatment of Seizures :  Patient is a 58 year old female with history of left temporal lobe epilepsy, migraine, hypertension, asthma, GERD, marijuana abuse who presented with headache, seizure at home.  Medication management barrier adherence and No Hospital Follow Up Provider appointment patient decline to have appointment made states, I need to talk to Dr. Vicci, I will call and get the appointmemt today  Goal:  Over the next 30 days, the patient will not experience hospital readmission  Interventions:  Transitions of Care: Doctor Visits  - discussed the importance of doctor visits Post discharge activity limitations prescribed by provider reviewed Reviewed upcoming Neurology appointment on 10/14/24-Patient's daughter or son will take her Medications reviewed, discussed the importance of compliance, patient has all medications and has requested refills  Discussed patient is active with SW/Leo from [place] and CM/Mr. Bill from [place] Discussed referral to Long[facility] CM-patient will consider and discuss with PCP   Hypertension Interventions: Last practice recorded BP readings:  BP Readings from Last 3 Encounters:  10/10/24 137/88  10/03/24 117/80  09/26/24 (!) 136/92   Most recent eGFR/CrCl:  Lab Results  Component Value Date   EGFR 83 04/24/2023    No components found for: CRCL  Reviewed medications with patient and discussed importance of compliance Advised patient, providing education and rationale, to monitor blood pressure daily and record, calling PCP for findings outside established  parameters Reviewed scheduled/upcoming provider appointments including: Neurology on 10/14/24, PCP on 12/04/24 Reviewed BP readings, advised keeping a log and taking with her to upcoming appointments Reviewed diet, discussed MyPlate method in detail Encouraged exercise 30 minutes a day for 5 days a week, start with 10 minutes a day and work up-revisited  Patient Self Care Activities:  check blood pressure daily write blood pressure results in a log or diary take blood pressure log to all doctor appointments keep all doctor appointments take medications for blood pressure exactly as prescribed    Plan:  No further follow up required:          Patient verbalizes understanding of instructions and care plan provided today and agrees to view in MyChart. Active MyChart status and patient understanding of how to access instructions and care plan via MyChart confirmed with patient.     No further follow up required:    Please call the care guide team at [phone] if you need to cancel or reschedule your appointment.   Please call [phone]-TALK (toll free, 24 hour hotline) go to [facility] [address] [place] [phone]) call 911 if you are experiencing a Mental Health or Behavioral Health Crisis or need someone to talk to.  Andrea Dimes RN, BSN [place]  [facility] RN Care Manager [phone]

## 2024-10-10 NOTE — Telephone Encounter (Signed)
 Called pt to let them know that they need to request these refills from neurology. Call went to VM  - mailbox is full unable to lm.

## 2024-10-10 NOTE — Telephone Encounter (Signed)
 Called pt to let her know that she will need to request refills from neurology. Call went to VM  - mail box is full.  Please advise.    Requested Prescriptions  Pending Prescriptions Disp Refills   divalproex  (DEPAKOTE ) 500 MG DR tablet 60 tablet 0    Sig: Take 1 tablet (500 mg total) by mouth 2 (two) times daily.     Neurology:  Anticonvulsants - Valproates Failed - 10/10/2024  4:50 PM      Failed - HGB in normal range and within 360 days    Hemoglobin  Date Value Ref Range Status  09/16/2024 15.3 (H) 12.0 - 15.0 g/dL Final  96/72/7976 87.8 11.1 - 15.9 g/dL Final   Hemoglobin Other  Date Value Ref Range Status  05/23/2007 0.0 0.0 - 0.0 Final         Failed - Valproic  Acid (serum) in normal range and within 360 days    Valproic  Acid Lvl  Date Value Ref Range Status  09/08/2024 <10 (L) 50 - 100 ug/mL Final    Comment:    Performed at [facility], [address], [place], [place] [address]         Passed - AST in normal range and within 360 days    AST  Date Value Ref Range Status  09/16/2024 15 15 - 41 U/L Final         Passed - ALT in normal range and within 360 days    ALT  Date Value Ref Range Status  09/16/2024 9 0 - 44 U/L Final         Passed - PLT in normal range and within 360 days    Platelets  Date Value Ref Range Status  09/16/2024 253 150 - 400 K/uL Final  02/20/2022 192 150 - 450 x10E3/uL Final         Passed - WBC in normal range and within 360 days    WBC  Date Value Ref Range Status  09/16/2024 6.3 4.0 - 10.5 K/uL Final         Passed - HCT in normal range and within 360 days    HCT  Date Value Ref Range Status  09/16/2024 45.0 36.0 - 46.0 % Final   Hematocrit  Date Value Ref Range Status  02/20/2022 37.5 34.0 - 46.6 % Final         Passed - Completed PHQ-2 or PHQ-9 in the last 360 days      Passed - Patient is not pregnant      Passed - Valid encounter within last 12 months    Recent Outpatient Visits            6 months ago Annual physical exam   [place] [facility] Dept Of [place]. [facility] Vicci Barnie NOVAK, MD   7 months ago Hospital discharge follow-up   [facility] - [facility] Dept Of [place]. [facility] Vicci Barnie NOVAK, MD   1 year ago Pap smear for cervical cancer screening   [place] [facility] - [facility] Dept Of [place]. [facility] Vicci Barnie NOVAK, MD   1 year ago Essential hypertension   [place] [facility] [place] [facility] - [facility] Dept Of [place]. [facility] Vicci Barnie NOVAK, MD   1 year ago Essential hypertension   [place] [facility] [place] - [facility] Dept Of [place]. [facility]  [place] M, PA-C               levETIRAcetam  (KEPPRA  XR) 500 MG 24 hr tablet 180 tablet 0    Sig: Take 6 tablets (3,000 mg total) by mouth at bedtime.     Neurology:  Anticonvulsants - levetiracetam  Passed - 10/10/2024  4:50 PM      Passed - Cr in normal range and within 360 days    Creatinine, Ser  Date Value Ref Range Status  09/16/2024 0.90 0.44 - 1.00 mg/dL Final   Creatinine, POC  Date Value Ref Range Status  06/20/2017 200 mg/dL Final         Passed - Completed PHQ-2 or PHQ-9 in the last 360 days      Passed - Valid encounter within last 12 months    Recent Outpatient Visits           6 months ago Annual physical exam   [place] [facility] Dept Of [place]. [facility] Vicci Barnie NOVAK, MD   7 months ago Hospital discharge follow-up   [facility] - [facility] Dept Of [place]. [facility] Vicci Barnie NOVAK, MD   1 year ago Pap smear for cervical cancer screening   [place] [facility] - A Dept Of [place]. [facility] Vicci Barnie NOVAK, MD   1 year ago Essential hypertension   [place] [facility] [place] - A Dept Of [place]. [facility] Vicci Barnie NOVAK, MD   1 year ago Essential hypertension    [place] [facility] [place] - A Dept Of [place]. [facility], [place] M, PA-C               zonisamide (ZONEGRAN) 100 MG capsule 90 capsule 0    Sig: Take 3 capsules (300 mg total) by mouth at bedtime.     Neurology: Anticonvulsants - topiramate  & zonisamide Passed - 10/10/2024  4:50 PM      Passed - Cr in normal range and within 360 days    Creatinine, Ser  Date Value Ref Range Status  09/16/2024 0.90 0.44 - 1.00 mg/dL Final   Creatinine, POC  Date Value Ref Range Status  06/20/2017 200 mg/dL Final         Passed - CO2 in normal range and within 360 days    CO2  Date Value Ref Range Status  09/09/2024 21 (L) 22 - 32 mmol/L Final   Bicarbonate  Date Value Ref Range Status  12/05/2020 29.5 (H) 20.0 - 28.0 mmol/L Final         Passed - ALT in normal range and within 360 days    ALT  Date Value Ref Range Status  09/16/2024 9 0 - 44 U/L Final         Passed - AST in normal range and within 360 days    AST  Date Value Ref Range Status  09/16/2024 15 15 - 41 U/L Final         Passed - Completed PHQ-2 or PHQ-9 in the last 360 days      Passed - Valid encounter within last 12 months    Recent Outpatient Visits           6 months ago Annual physical exam   [place] [facility] Of . [facility] Vicci Barnie NOVAK, MD   7  months ago Hospital discharge follow-up   [place] [facility] - A Dept Of [place]. [facility] Vicci Barnie NOVAK, MD   1 year ago Pap smear for cervical cancer screening   [place] [facility] - [facility] Dept Of [place]. [facility] Vicci Barnie NOVAK, MD   1 year ago Essential hypertension   [place] [facility] [place] - A Dept Of [place]. [facility] Vicci Barnie NOVAK, MD   1 year ago Essential hypertension   [place] [facility] [place] - A Dept Of [place]. [facility] [place], [place], PA-C

## 2024-10-14 ENCOUNTER — Encounter: Payer: Self-pay | Admitting: Neurology

## 2024-10-14 ENCOUNTER — Other Ambulatory Visit: Payer: Self-pay

## 2024-10-14 ENCOUNTER — Ambulatory Visit (INDEPENDENT_AMBULATORY_CARE_PROVIDER_SITE_OTHER): Payer: MEDICAID | Admitting: Neurology

## 2024-10-14 VITALS — BP 137/86 | HR 58 | Ht 63.0 in | Wt 147.0 lb

## 2024-10-14 DIAGNOSIS — G40001 Localization-related (focal) (partial) idiopathic epilepsy and epileptic syndromes with seizures of localized onset, not intractable, with status epilepticus: Secondary | ICD-10-CM | POA: Diagnosis not present

## 2024-10-14 MED ORDER — LEVETIRACETAM ER 500 MG PO TB24
3000.0000 mg | ORAL_TABLET | Freq: Every day | ORAL | 6 refills | Status: AC
  Filled 2024-10-14 (×2): qty 180, 30d supply, fill #0

## 2024-10-14 MED ORDER — DIVALPROEX SODIUM 500 MG PO DR TAB
500.0000 mg | DELAYED_RELEASE_TABLET | Freq: Two times a day (BID) | ORAL | 3 refills | Status: AC
  Filled 2024-10-14 (×2): qty 180, 90d supply, fill #0

## 2024-10-14 MED ORDER — ZONISAMIDE 100 MG/5ML PO SUSP
ORAL | 3 refills | Status: AC
  Filled 2024-10-14: qty 1350, 90d supply, fill #0
  Filled 2024-10-14: qty 1350, fill #0

## 2024-10-14 MED ORDER — CLONAZEPAM 0.5 MG PO TABS
0.5000 mg | ORAL_TABLET | Freq: Every morning | ORAL | 5 refills | Status: AC
  Filled 2024-10-14 (×2): qty 30, 30d supply, fill #0

## 2024-10-14 NOTE — Progress Notes (Signed)
 NEUROLOGY FOLLOW UP OFFICE NOTE  Tammy [name] [name] 03/12/1966  HISTORY OF PRESENT ILLNESS:  Discussed the use of AI scribe software for clinical note transcription with the patient, who gave verbal consent to proceed.  History of Present Illness Tammy [name] is a 58 year old female with epilepsy who presents for a neurology follow-up. She is alone in the office today.  She was last seen in the office in January 2025. Since then, she was admitted for status epilepticus from March 8 to 10, during which electrographic seizures were noted arising from the right frontal region, initially occurring ten times an hour. Interictal sharp waves were observed over the right frontal region and left anterior temporal region. She was discharged on Keppra  1500 mg twice a day, Depakote  500 mg twice a day, clonazepam  0.5 mg twice a day, and topiramate  200 mg twice a day.  In October, she was hospitalized again for frequent seizures, Depakote  and Keppra  levels were undetectable. An EEG showed polyspikes over the left anterior temporal region and generalized slowing. Topiramate  was switched to zonisamide 300 mg every night to help with compliance, and Keppra  was switched to extended release 3000 mg every night. Depakote  and clonazepam  were continued at the same dose.  She takes four 750 mg Keppra  tablets until she is finished, then plans to switch to six 500 mg tablets every night. She finds the zonisamide capsules difficult to swallow, describing them as 'bubbling around' in her throat, and is interested in switching to a liquid form. She has not needed to use the [place]  nasal spray since her last hospital visit and has not experienced any seizures in the past month.  She experiences significant drowsiness from her medications, particularly clonazepam , which affects her ability to get to the bathroom in the morning, she finds herself urinating as she rushes to the bathroom. She also reports night  sweats since her last hospital stay, requiring her children to fan her at night. She has fallen twice, once in her apartment and once returning from the store, with the last fall occurring in October before her last seizure.  She lives alone but her daughter and son take turns staying with her due to apartment restrictions. They assist her with medication management. She reports stress related to her living situation, including issues with her apartment maintenance and safety concerns.   History on Initial Assessment 02/22/2021: This is a 58 year old right-handed woman with a history of hypertension, migraines, complex partial seizures with secondary generalization, presenting to establish care. Seizures started in childhood, she was having frequent passing out episodes. She had a nocturnal seizure in 2010 and was started on Dilantin , lost to follow-up until 2017 when she saw neurologist Dr. Onita and reported 2-3 seizures every month preceded by lights flashing, bell ringing, then loss of consciousness. Dilantin  apparently caused throat swelling, she was taking Keppra . She was started on Depakote  in 2017 for seizure and migraine prophylaxis. She was admitted to [facility] in January 2022 after she was found unresponsive, with tongue bite and incontinence. She remained encephalopathic in the ER, then had gaze deviation, not following commands. She was found to be COVID positive. She was given IV Ativan  and Keppra , EEG after showed diffuse slowing with left anterior temporal polyspikes, at times with PLEDs-like appearance. She started returning to baseline around 48 hours later. 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 She was discharged home on Depakote  500mg  BID (which  she was taking prior to admission, level 68 on admission) and Levetiracetam  1000mg  BID.  Since hospital discharge, she has been living with her aunt. Prior to this, she was living with a  different aunt. Family tries to be with her 24/7, rotating with each other. She reports majority of her seizures are nocturnal, she has 2-3 nocturnal seizures a month. She also reports episodes of confusion, she brings a piece of paper where she wrote down gibberish words when she was asked to write her name and SSN. She has occasional olfactory hallucinations where she smells a stench. When she got of of the hospital last January, she noticed right-sided weakness. She was fine yesterday, but this morning woke up with the left side of her body hurting. She reports sharp pain radiating up to her hip. No falls since January. She denies any numbness/tingling, she has occasional urinary incontinence. She has occasional uncontrollable hand tremors, L>R. Migraines started at age 48, pain usually starts on the left side of her head and radiates diffusely. She would be sensitive to lights and sounds with occasional nausea. She has migraines 2-3 times a week. She used to take sumatriptan  but felt it increased her appetite. She usually takes Tylenol  which does not help much. She brings a note she wrote in 2012 about her need to see a doctor about her feelings, not just to push pills but to listen to her. She notes depression and is tearful several times in the office today. She reports the medications make her tired, make her sad. She makes herself get up, otherwise she cannot do anything. Since January, she can't even cook for herself anymore. She works for [employer] in [place] for a week every few months. She does not drive. She reports a significant amount of weight loss and points to her goiter. She denies any dizziness, she has occasional neck pain and double vision. She reports left calf swelling and left hip pain.  Epilepsy Risk Factors:  Her grandson has seizures. 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  There is no history of febrile convulsions, CNS infections such as meningitis/encephalitis, significant traumatic brain injury, neurosurgical procedures.  Prior AEDs: Dilantin , Keppra   Diagnostic Data: EEGs: 11/2020 prolonged EEG showed polyspikes in the left anterior temporal region  EEG in 01/2024 showed electrographic status epilepticus arising from the right frontal region. Interictal discharges over the right frontal and left temporal regions  EEG in 08/2024 showed polyspikes over the left anterior temporal region and generalized slowing.   MRI: MRI brain with and without contrast in 11/2020 no acute changes, there was mild chronic microvascular disease, hippocampi symmetric with no abnormal signal or enhancement  MRI brain without cnotrast 01/2024 no acute changes, hippocampi symmetric. There is minimal multifocal hyperintense FLAIR signal in the white matter.   PAST MEDICAL HISTORY: Past Medical History:  Diagnosis Date   Asthma    Blood transfusion without reported diagnosis    'a long time ago   GERD (gastroesophageal reflux disease)    Headache(784.0)    Hypertension    Seizures (HCC)     MEDICATIONS: Current Outpatient Medications on File Prior to Visit  Medication Sig Dispense Refill   acetaminophen  (TYLENOL ) 500 MG tablet Take 1 tablet (500 mg total) by mouth every 8 (eight) hours as needed. 60 tablet 1   albuterol  (VENTOLIN  HFA) 108 (  90 Base) MCG/ACT inhaler Inhale 2 puffs into the lungs every 6 (six) hours as needed for wheezing or shortness of breath. 18 g 2   amLODipine  (NORVASC ) 5 MG tablet Take 1 tablet (5 mg total) by mouth daily. 30 tablet 0   clonazePAM  (KLONOPIN ) 0.5 MG tablet Take 1 tablet (0.5 mg total) by mouth 2 (two) times daily. 60 tablet 0   diazePAM , 15 MG Dose, (VALTOCO  15 MG DOSE) 2 x 7.5 MG/0.1ML LQPK Place 15 mg into the nose as needed (seizure lasting over 2 minutes). 10 each 2   divalproex  (DEPAKOTE ) 500 MG DR tablet Take 1 tablet (500 mg total)  by mouth 2 (two) times daily. 60 tablet 0   levETIRAcetam  (KEPPRA  XR) 500 MG 24 hr tablet Take 6 tablets (3,000 mg total) by mouth at bedtime. 180 tablet 0   lidocaine  (LIDODERM ) 5 % Place 1 patch onto the skin daily. Remove & Discard patch within 12 hours or as directed by MD (Patient not taking: Reported on 10/10/2024) 30 patch 0   losartan  (COZAAR ) 100 MG tablet Take 1 tablet (100 mg total) by mouth daily. 30 tablet 0   zonisamide (ZONEGRAN) 100 MG capsule Take 3 capsules (300 mg total) by mouth at bedtime. 90 capsule 0   [DISCONTINUED] lisinopril -hydrochlorothiazide  (ZESTORETIC ) 10-12.5 MG tablet Take 2 tablets by mouth daily. To lower blood pressure (Patient not taking: Reported on 12/05/2020) 60 tablet 2   No current facility-administered medications on file prior to visit.    ALLERGIES: Allergies  Allergen Reactions   Dilantin  [Phenytoin ] Swelling   Aspirin Other (See Comments)    Patient unsure of reaction    Porcine (Pork) Protein-Containing Drug Products Other (See Comments)    Unknown- not religious    Tramadol Itching and Other (See Comments)    Pt states she is unsure of reaction    FAMILY HISTORY: Family History  Problem Relation Age of Onset   Heart disease Mother    Migraines Mother    Heart attack Father    Colon cancer Neg Hx     SOCIAL HISTORY: Social History   Socioeconomic History   Marital status: Single    Spouse name: Not on file   Number of children: Not on file   Years of education: Not on file   Highest education level: Not on file  Occupational History   Not on file  Tobacco Use   Smoking status: Never   Smokeless tobacco: Never  Vaping Use   Vaping status: Never Used  Substance and Sexual Activity   Alcohol use: No   Drug use: Yes    Types: Marijuana    Comment: heavy mariijuana use   Sexual activity: Not Currently  Other Topics Concern   Not on file  Social History Narrative   Right handed    Lives with family    Social Drivers of  Health   Financial Resource Strain: Not on file  Food Insecurity: No Food Insecurity (09/12/2024)   Hunger Vital Sign    Worried About Running Out of Food in the Last Year: Never true    Ran Out of Food in the Last Year: Never true  Transportation Needs: No Transportation Needs (09/12/2024)   PRAPARE - Administrator, Civil Service (Medical): No    Lack of Transportation (Non-Medical): No  Physical Activity: Not on file  Stress: Not on file  Social Connections: Not on file  Intimate Partner Violence: Not At Risk (09/12/2024)   Humiliation,  Afraid, Rape, and Kick questionnaire    Fear of Current or Ex-Partner: No    Emotionally Abused: No    Physically Abused: No    Sexually Abused: No     PHYSICAL EXAM: Vitals:   10/14/24 1331  BP: 137/86  Pulse: (!) 58  SpO2: 99%   General: No acute distress Head:  Normocephalic/atraumatic Skin/Extremities: No rash, no edema Neurological Exam: alert and awake. No aphasia or dysarthria. Fund of knowledge is appropriate.  Attention and concentration are normal.   Cranial nerves: Pupils equal, round. Extraocular movements intact with no nystagmus. Visual fields full.  No facial asymmetry.  Motor: Bulk and tone normal, muscle strength 5/5 throughout with no pronator drift.   Finger to nose testing intact.  Gait narrow-based and steady, no ataxia. No tremors.   Assessment & Plan This is a 58 yo RH woman with a history of hypertension, migraines, and epilepsy. She has a history of left temporal lobe epilepsy however she was in status epilepticus in 01/2024 arising from the right frontal region. Interictal EEG showed right frontal and left temporal epileptiform discharges. MRI brain no acute changes, hippocampi symmetric. Current regimen includes Keppra , Depakote , clonazepam , and zonisamide. No seizures since hospitalization. Medication adherence challenging due to pill swallowing difficulties. - Check Depakote , Keppra , and zonisamide  levels. - Switched zonisamide to liquid form, 15 mL every night (Zonisamide 100mg /49mL for 300mg  at bedtime) . - Continue Depakote  500 mg twice a day. - Continue Keppra  ER 500 mg, six tablets every night (3000mg  at bedtime) - Reduced clonazepam  0.5 mg to once a day (every morning) - Provided letter stating inability to work due to medical condition.  Night sweats - Discuss night sweats with primary care physician for further evaluation.  She is aware of [place] driving laws to stop driving laws to stop driving after a seizure until 6 months seizure-free.   Thank you for allowing me to participate in her care.  Please do not hesitate to call for any questions or concerns.   Tammy [name], M.D.   CC: Dr. Vicci

## 2024-10-14 NOTE — Patient Instructions (Signed)
 Good to see you.  Have bloodwork done for Keppra  level, Depakote  level, Zonisamide level  2. Reduce Clonazepam  0.5mg : take 1 tablet every morning (stop the evening dose)  3. Let's switch to the liquid formulation of Zonisamide 100mg /35mL: take 15mL every night  4. Continue Depakote  500mg  twice a day  5. Continue Keppra  ER 500mg : Take 6 tablets (3000mg ) every night  6. Follow-up in 3 months, call for any changes   Seizure Precautions: 1. If medication has been prescribed for you to prevent seizures, take it exactly as directed.  Do not stop taking the medicine without talking to your doctor first, even if you have not had a seizure in a long time.   2. Avoid activities in which a seizure would cause danger to yourself or to others.  Don't operate dangerous machinery, swim alone, or climb in high or dangerous places, such as on ladders, roofs, or girders.  Do not drive unless your doctor says you may.  3. If you have any warning that you may have a seizure, lay down in a safe place where you can't hurt yourself.    4.  No driving for 6 months from last seizure, as per [place]  state law.   Please refer to the following link on the Epilepsy Foundation of America's website for more information: http://www.epilepsyfoundation.org/answerplace/Social/driving/drivingu.cfm   5.  Maintain good sleep hygiene. Avoid alcohol.  6.  Contact your doctor if you have any problems that may be related to the medicine you are taking.  7.  Call 911 and bring the patient back to the ED if:        A.  The seizure lasts longer than 5 minutes.       B.  The patient doesn't awaken shortly after the seizure  C.  The patient has new problems such as difficulty seeing, speaking or moving  D.  The patient was injured during the seizure  E.  The patient has a temperature over 102 F (39C)  F.  The patient vomited and now is having trouble breathing

## 2024-10-16 ENCOUNTER — Other Ambulatory Visit (HOSPITAL_COMMUNITY): Payer: Self-pay

## 2024-10-16 ENCOUNTER — Other Ambulatory Visit: Payer: Self-pay

## 2024-10-18 LAB — VALPROIC ACID LEVEL: Valproic Acid Lvl: 36.7 mg/L — ABNORMAL LOW (ref 50.0–100.0)

## 2024-10-18 LAB — LEVETIRACETAM, IMMUNOASSAY: LEVETIRACETAM, IMMUNOASSAY: 4 ug/mL — ABNORMAL LOW (ref 6.0–46.0)

## 2024-10-18 LAB — ZONISAMIDE LEVEL: Zonisamide: 4.2 ug/mL — ABNORMAL LOW (ref 10.0–40.0)

## 2024-11-07 ENCOUNTER — Other Ambulatory Visit (HOSPITAL_COMMUNITY): Payer: Self-pay

## 2024-11-10 ENCOUNTER — Other Ambulatory Visit (HOSPITAL_COMMUNITY): Payer: Self-pay

## 2024-12-04 ENCOUNTER — Ambulatory Visit: Payer: MEDICAID | Attending: Internal Medicine | Admitting: Internal Medicine

## 2024-12-04 ENCOUNTER — Other Ambulatory Visit: Payer: Self-pay

## 2024-12-04 ENCOUNTER — Encounter: Payer: Self-pay | Admitting: Internal Medicine

## 2024-12-04 VITALS — BP 175/117 | HR 57 | Temp 98.1°F | Ht 63.0 in | Wt 151.0 lb

## 2024-12-04 DIAGNOSIS — E049 Nontoxic goiter, unspecified: Secondary | ICD-10-CM

## 2024-12-04 DIAGNOSIS — G40909 Epilepsy, unspecified, not intractable, without status epilepticus: Secondary | ICD-10-CM

## 2024-12-04 DIAGNOSIS — I10 Essential (primary) hypertension: Secondary | ICD-10-CM

## 2024-12-04 DIAGNOSIS — K5903 Drug induced constipation: Secondary | ICD-10-CM

## 2024-12-04 MED ORDER — LOSARTAN POTASSIUM 100 MG PO TABS
100.0000 mg | ORAL_TABLET | Freq: Every day | ORAL | 1 refills | Status: AC
  Filled 2024-12-04 (×2): qty 90, 90d supply, fill #0

## 2024-12-04 MED ORDER — AMLODIPINE BESYLATE 10 MG PO TABS
10.0000 mg | ORAL_TABLET | Freq: Every day | ORAL | 1 refills | Status: AC
  Filled 2024-12-04 (×2): qty 90, 90d supply, fill #0

## 2024-12-04 NOTE — Patient Instructions (Signed)
" °  VISIT SUMMARY: During your follow-up visit, we reviewed your hypertension and seizure disorder management. Your blood pressure was elevated today, but you mentioned it is usually well-controlled at home. We also discussed your seizure medications and the side effects you are experiencing.  YOUR PLAN: -ESSENTIAL HYPERTENSION: Essential hypertension means high blood pressure without a known secondary cause. Your blood pressure was high today, so we increased your losartan  dose to 10 mg daily and continued your amlodipine  at 5 mg daily. Please check your blood pressure twice a week and record the readings. We also encourage you to eat more fresh fruits and vegetables.  -SEIZURE DISORDER: A seizure disorder is a condition where you experience seizures. You are currently on Keppra , lacosamide, and Depakote . You reported side effects from zonisamide , including headaches and constipation. Continue your current medications, but contact Dr. Georjean to discuss these side effects. Use stool softeners and increase dietary fiber to help with constipation. You may also consider using Miralax, but dietary changes are preferred. Please bring all your medications to your next appointment for review.  INSTRUCTIONS: Please follow up with Dr. Herlene in one month for your hypertension. Additionally, contact Dr. Georjean to discuss the side effects of zonisamide . Continue to monitor your blood pressure twice a week and record the readings. Ensure you bring all your medications to your next appointment for review.                      Contains text generated by [employer]bridge.                                 Contains text generated by [employer]bridge.   "

## 2024-12-04 NOTE — Progress Notes (Signed)
 "   Patient ID: Tammy [name], female    DOB: [birth year]  MRN: [id number]  CC: Follow-up (ER f/u. Med refills. /Zonisamide  causing headaches, diarrhea X2-3 weeks/No to flu vax )   Subjective: Tammy [name] is a 59 y.o. female who presents for chronic ds management. 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  History of Present Illness Tammy [name] is a 59 year old female with hypertension and seizure disorder who presents for a follow-up visit.  She continues to take her blood pressure medications, losartan  100 mg daily and amlodipine  5 mg daily, but requires refills. Despite taking both medications today, her blood pressure was elevated at 180/130 mmHg, which she attributes to eating [employer] before the appointment. She usually monitors her blood pressure twice a week with her daughter, with typical readings around 130/80 mmHg.  Regarding her seizure disorder, she was hospitalized in October for sz. She reports taking Keppra  3000 mg, with her neurologist at the [facility] recommending to split the dose to 1500 mg morning and night, zonisamide  at night, and Depakote  500 mg twice daily. She experiences headaches and constipation associated with zonisamide  but has not informed her neurologist. Her last neurology appointment with Dr. Georjean was in November.  Has thyroid  goiter with nodules. No changes in her thyroid  condition. She denies shortness of breath when lying down and difficulty swallowing. She is scheduled to follow up with her endocrinologist in June.    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   COVID-19 virus infection 12/05/2020   Acute superficial venous thrombosis of lower extremity, left 12/13/2018   Hypertension 12/13/2018   Chronic migraine 09/07/2016   Seizure (HCC) 08/20/2016   Asthma 08/20/2016   DVT, HX OF 01/20/2008   FIBROIDS, UTERUS 01/16/2008     Medications Ordered Prior to Encounter[1]  Allergies[2]  Social History   Socioeconomic History   Marital status: Single    Spouse name: Not on file   Number of children: Not on file   Years of education: Not on file   Highest education level: Not on file  Occupational History   Not on file  Tobacco Use   Smoking status: Never   Smokeless tobacco: Never  Vaping Use   Vaping status: Never Used  Substance and Sexual Activity   Alcohol use: No   Drug use: Yes    Types: Marijuana    Comment: heavy mariijuana use   Sexual activity: Not Currently  Other Topics Concern   Not on file  Social History Narrative   Right handed    Lives with family    Social Drivers of Health   Tobacco Use: Low Risk (12/04/2024)   Patient History    Smoking Tobacco Use: Never    Smokeless Tobacco Use: Never    Passive Exposure: Not on file  Financial Resource Strain: Not on file  Food Insecurity: No Food Insecurity (09/12/2024)   Epic    Worried About Radiation Protection Practitioner of Food in the Last Year: Never true    Ran Out  of Food in the Last Year: Never true  Transportation Needs: No Transportation Needs (09/12/2024)   Epic    Lack of Transportation (Medical): No    Lack of Transportation (Non-Medical): No  Physical Activity: Not on file  Stress: Not on file  Social Connections: Not on file  Intimate Partner Violence: Not At Risk (09/12/2024)   Epic    Fear of Current or Ex-Partner: No    Emotionally Abused: No    Physically Abused: No    Sexually Abused: No  Depression (PHQ2-9): Low Risk  (09/26/2024)   Depression (PHQ2-9)    PHQ-2 Score: 1  Alcohol Screen: Not on file  Housing: Low Risk (09/12/2024)   Epic    Unable to Pay for Housing in the Last Year: No    Number of Times Moved in the Last Year: 0    Homeless in the Last Year: No  Utilities: Not At Risk (09/12/2024)   Epic    Threatened with loss of utilities: No  Health Literacy: Not on file    Family History  Problem Relation Age of Onset   Heart disease Mother    Migraines Mother    Heart attack Father    Colon cancer Neg Hx     Past Surgical History:  Procedure Laterality Date   HERNIA REPAIR     UTERINE FIBROID SURGERY      ROS: Review of Systems Negative except as stated above  PHYSICAL EXAM: BP (!) 175/117   Pulse (!) 57   Temp 98.1 F (36.7 C) (Oral)   Ht 5' 3 (1.6 m)   Wt 151 lb (68.5 kg)   LMP 03/08/2016   SpO2 99%   BMI 26.75 kg/m   Wt Readings from Last 3 Encounters:  12/04/24 151 lb (68.5 kg)  10/14/24 147 lb (66.7 kg)  09/16/24 130 lb (59 kg)    Physical Exam  General appearance - alert, well appearing, and in no distress Mental status - normal mood, behavior, speech, dress, motor activity, and thought processes Neck - supple, no significant adenopathy, thyroid  diffusely enlarged Chest - clear to auscultation, no wheezes, rales or rhonchi, symmetric air entry Heart - normal rate, regular rhythm, normal S1, S2, no murmurs, rubs, clicks or gallops Extremities - peripheral pulses normal, no pedal edema, no clubbing or cyanosis      Latest Ref Rng & Units 09/16/2024   12:26 PM 09/16/2024   12:15 PM 09/09/2024    9:29 AM  CMP  Glucose 70 - 99 mg/dL 81   70   BUN 6 - 20 mg/dL 13   13   Creatinine 9.55 - 1.00 mg/dL 9.09   9.24   Sodium 864 - 145 mmol/L 144   138   Potassium 3.5 - 5.1 mmol/L 4.0   3.9   Chloride 98 - 111 mmol/L 110   105   CO2 22 - 32 mmol/L   21   Calcium 8.9 - 10.3 mg/dL   9.2   Total Protein 6.5 - 8.1 g/dL  7.2    Total Bilirubin 0.0 - 1.2 mg/dL   0.7    Alkaline Phos 38 - 126 U/L  59    AST 15 - 41 U/L  15    ALT 0 - 44 U/L  9     Lipid Panel     Component Value Date/Time   CHOL 206 (H) 03/28/2024 1130   TRIG 54 03/28/2024 1130   HDL 76 03/28/2024 1130   CHOLHDL 2.7  03/28/2024 1130   LDLCALC 120 (H) 03/28/2024 1130    CBC    Component Value Date/Time   WBC 6.3 09/16/2024 1211   RBC 5.04 09/16/2024 1211   HGB 15.3 (H) 09/16/2024 1226   HGB 12.1 02/20/2022 1555   HCT 45.0 09/16/2024 1226   HCT 37.5 02/20/2022 1555   PLT 253 09/16/2024 1211   PLT 192 02/20/2022 1555   MCV 87.9 09/16/2024 1211   MCV 92 02/20/2022 1555   MCH 27.2 09/16/2024 1211   MCHC 30.9 09/16/2024 1211   RDW 14.7 09/16/2024 1211   RDW 12.8 02/20/2022 1555   LYMPHSABS 2.1 09/16/2024 1211   LYMPHSABS 2.5 12/22/2020 1629   MONOABS 0.5 09/16/2024 1211   EOSABS 0.1 09/16/2024 1211   EOSABS 0.1 12/22/2020 1629   BASOSABS 0.1 09/16/2024 1211   BASOSABS 0.0 12/22/2020 1629    ASSESSMENT AND PLAN: 1. Essential hypertension (Primary) Not at goal Increase Norvasc  to 10 mg daily. Continue Cozar 100 mg daily Appt with clinical pharmacist in 1 mth for recheck - amLODipine  (NORVASC ) 10 MG tablet; Take 1 tablet (10 mg total) by mouth daily.  Dispense: 90 tablet; Refill: 1 - losartan  (COZAAR ) 100 MG tablet; Take 1 tablet (100 mg total) by mouth daily.  Dispense: 90 tablet; Refill: 1  2. Seizure disorder [facility]) Patient reports no seizures since last hospitalization in October. She is reporting some headaches and constipation with zonisamide .  I advised that she speaks with her neurologist about the headaches to see which she would advise.  Advised against stopping the medication without speaking to her neurologist.  She will continue Keppra  and Depakote  as prescribed.  3. Thyroid  goiter We will continue to monitor.  She denies any compressive symptoms at this time.  Has follow-up appointment with endocrinology later this year.  4. Drug-induced  constipation Recommend increasing green leafy vegetables in the diet.  She can also eat some prunes a few days a week.  If these measures do not help, I recommend using MiraLAX which can be purchased over-the-counter.   Patient was given the opportunity to ask questions.  Patient verbalized understanding of the plan and was able to repeat key elements of the plan.   This documentation was completed using Paediatric nurse.  Any transcriptional errors are unintentional.  No orders of the defined types were placed in this encounter.    Requested Prescriptions   Signed Prescriptions Disp Refills   amLODipine  (NORVASC ) 10 MG tablet 90 tablet 1    Sig: Take 1 tablet (10 mg total) by mouth daily.   losartan  (COZAAR ) 100 MG tablet 90 tablet 1    Sig: Take 1 tablet (100 mg total) by mouth daily.    Return in about 4 months (around 04/03/2025).  Barnie Louder, MD, FACP     [1]  Current Outpatient Medications on File Prior to Visit  Medication Sig Dispense Refill   acetaminophen  (TYLENOL ) 500 MG tablet Take 1 tablet (500 mg total) by mouth every 8 (eight) hours as needed. 60 tablet 1   albuterol  (VENTOLIN  HFA) 108 (90 Base) MCG/ACT inhaler Inhale 2 puffs into the lungs every 6 (six) hours as needed for wheezing or shortness of breath. 18 g 2   clonazePAM  (KLONOPIN ) 0.5 MG tablet Take 1 tablet (0.5 mg total) by mouth every morning. 30 tablet 5   diazePAM , 15 MG Dose, (VALTOCO  15 MG DOSE) 2 x 7.5 MG/0.1ML LQPK Place 15 mg into the nose as needed (seizure lasting over 2 minutes).  10 each 2   divalproex  (DEPAKOTE ) 500 MG DR tablet Take 1 tablet (500 mg total) by mouth 2 (two) times daily. 180 tablet 3   levETIRAcetam  (KEPPRA  XR) 500 MG 24 hr tablet Take 6 tablets (3,000 mg total) by mouth at bedtime. 180 tablet 6   lidocaine  (LIDODERM ) 5 % Place 1 patch onto the skin daily. Remove & Discard patch within 12 hours or as directed by MD 30 patch 0   Zonisamide  100 MG/5ML SUSP  Take 15mL (300mg ) every night 1350 mL 3   [DISCONTINUED] lisinopril -hydrochlorothiazide  (ZESTORETIC ) 10-12.5 MG tablet Take 2 tablets by mouth daily. To lower blood pressure (Patient not taking: Reported on 12/05/2020) 60 tablet 2   No current facility-administered medications on file prior to visit.  [2]  Allergies Allergen Reactions   Dilantin  [Phenytoin ] Swelling   Aspirin Other (See Comments)    Patient unsure of reaction    Porcine (Pork) Protein-Containing Drug Products Other (See Comments)    Unknown- not religious    Tramadol Itching and Other (See Comments)    Pt states she is unsure of reaction   "

## 2024-12-29 ENCOUNTER — Other Ambulatory Visit (HOSPITAL_COMMUNITY): Payer: Self-pay

## 2025-01-29 ENCOUNTER — Ambulatory Visit: Payer: MEDICAID | Admitting: Neurology

## 2025-04-07 ENCOUNTER — Ambulatory Visit: Payer: Self-pay | Admitting: Internal Medicine

## 2025-05-26 ENCOUNTER — Ambulatory Visit: Admitting: Internal Medicine
